# Patient Record
Sex: Male | Born: 1940 | Race: White | Hispanic: No | Marital: Married | Smoking: Never smoker
Health system: Southern US, Community
[De-identification: ages and names within clinical notes are randomized; demographics above are authoritative.]

## PROBLEM LIST (undated history)

## (undated) DIAGNOSIS — N400 Enlarged prostate without lower urinary tract symptoms: Secondary | ICD-10-CM

## (undated) DIAGNOSIS — E119 Type 2 diabetes mellitus without complications: Secondary | ICD-10-CM

## (undated) DIAGNOSIS — Z87442 Personal history of urinary calculi: Secondary | ICD-10-CM

## (undated) DIAGNOSIS — I469 Cardiac arrest, cause unspecified: Secondary | ICD-10-CM

## (undated) DIAGNOSIS — M1990 Unspecified osteoarthritis, unspecified site: Secondary | ICD-10-CM

## (undated) DIAGNOSIS — D649 Anemia, unspecified: Secondary | ICD-10-CM

## (undated) DIAGNOSIS — R972 Elevated prostate specific antigen [PSA]: Secondary | ICD-10-CM

## (undated) DIAGNOSIS — I2510 Atherosclerotic heart disease of native coronary artery without angina pectoris: Secondary | ICD-10-CM

## (undated) DIAGNOSIS — I219 Acute myocardial infarction, unspecified: Secondary | ICD-10-CM

## (undated) DIAGNOSIS — I459 Conduction disorder, unspecified: Secondary | ICD-10-CM

## (undated) DIAGNOSIS — J45909 Unspecified asthma, uncomplicated: Secondary | ICD-10-CM

## (undated) DIAGNOSIS — G8929 Other chronic pain: Secondary | ICD-10-CM

## (undated) DIAGNOSIS — R0600 Dyspnea, unspecified: Secondary | ICD-10-CM

## (undated) DIAGNOSIS — I10 Essential (primary) hypertension: Secondary | ICD-10-CM

## (undated) DIAGNOSIS — K219 Gastro-esophageal reflux disease without esophagitis: Secondary | ICD-10-CM

## (undated) DIAGNOSIS — M545 Low back pain, unspecified: Secondary | ICD-10-CM

## (undated) DIAGNOSIS — I700 Atherosclerosis of aorta: Secondary | ICD-10-CM

## (undated) DIAGNOSIS — E785 Hyperlipidemia, unspecified: Secondary | ICD-10-CM

## (undated) DIAGNOSIS — Z95810 Presence of automatic (implantable) cardiac defibrillator: Secondary | ICD-10-CM

## (undated) DIAGNOSIS — G459 Transient cerebral ischemic attack, unspecified: Secondary | ICD-10-CM

## (undated) DIAGNOSIS — N430 Encysted hydrocele: Secondary | ICD-10-CM

## (undated) DIAGNOSIS — I255 Ischemic cardiomyopathy: Secondary | ICD-10-CM

## (undated) DIAGNOSIS — G4700 Insomnia, unspecified: Secondary | ICD-10-CM

## (undated) DIAGNOSIS — R55 Syncope and collapse: Secondary | ICD-10-CM

## (undated) DIAGNOSIS — N529 Male erectile dysfunction, unspecified: Secondary | ICD-10-CM

## (undated) DIAGNOSIS — R001 Bradycardia, unspecified: Secondary | ICD-10-CM

## (undated) DIAGNOSIS — M5430 Sciatica, unspecified side: Secondary | ICD-10-CM

## (undated) DIAGNOSIS — I5030 Unspecified diastolic (congestive) heart failure: Secondary | ICD-10-CM

## (undated) DIAGNOSIS — I4891 Unspecified atrial fibrillation: Secondary | ICD-10-CM

## (undated) DIAGNOSIS — Z7901 Long term (current) use of anticoagulants: Secondary | ICD-10-CM

## (undated) HISTORY — DX: Benign prostatic hyperplasia without lower urinary tract symptoms: N40.0

## (undated) HISTORY — DX: Sciatica, unspecified side: M54.30

## (undated) HISTORY — DX: Dyspnea, unspecified: R06.00

## (undated) HISTORY — DX: Conduction disorder, unspecified: I45.9

## (undated) HISTORY — DX: Unspecified asthma, uncomplicated: J45.909

## (undated) HISTORY — DX: Bradycardia, unspecified: R00.1

## (undated) HISTORY — DX: Essential (primary) hypertension: I10

## (undated) HISTORY — DX: Cardiac arrest, cause unspecified: I46.9

## (undated) HISTORY — PX: PROSTATE BIOPSY: SHX241

## (undated) HISTORY — DX: Atherosclerotic heart disease of native coronary artery without angina pectoris: I25.10

## (undated) HISTORY — PX: CORONARY ANGIOPLASTY: SHX604

## (undated) HISTORY — PX: CATARACT EXTRACTION, BILATERAL: SHX1313

## (undated) HISTORY — PX: OTHER SURGICAL HISTORY: SHX169

## (undated) HISTORY — DX: Personal history of urinary calculi: Z87.442

## (undated) HISTORY — DX: Gastro-esophageal reflux disease without esophagitis: K21.9

## (undated) HISTORY — DX: Transient cerebral ischemic attack, unspecified: G45.9

## (undated) HISTORY — DX: Male erectile dysfunction, unspecified: N52.9

## (undated) HISTORY — DX: Syncope and collapse: R55

## (undated) HISTORY — DX: Presence of automatic (implantable) cardiac defibrillator: Z95.810

## (undated) HISTORY — DX: Type 2 diabetes mellitus without complications: E11.9

## (undated) HISTORY — PX: EYE SURGERY: SHX253

## (undated) HISTORY — PX: JOINT REPLACEMENT: SHX530

## (undated) HISTORY — DX: Hyperlipidemia, unspecified: E78.5

---
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
## 1994-01-14 DIAGNOSIS — I2510 Atherosclerotic heart disease of native coronary artery without angina pectoris: Secondary | ICD-10-CM

## 1994-01-14 HISTORY — DX: Atherosclerotic heart disease of native coronary artery without angina pectoris: I25.10

## 2004-11-17 ENCOUNTER — Other Ambulatory Visit: Payer: Self-pay

## 2004-11-19 ENCOUNTER — Ambulatory Visit: Payer: Self-pay | Admitting: Internal Medicine

## 2004-11-27 ENCOUNTER — Inpatient Hospital Stay: Payer: Self-pay | Admitting: Unknown Physician Specialty

## 2004-12-21 HISTORY — PX: OTHER SURGICAL HISTORY: SHX169

## 2005-12-21 DIAGNOSIS — Z95810 Presence of automatic (implantable) cardiac defibrillator: Secondary | ICD-10-CM

## 2005-12-21 HISTORY — PX: OTHER SURGICAL HISTORY: SHX169

## 2005-12-21 HISTORY — DX: Presence of automatic (implantable) cardiac defibrillator: Z95.810

## 2006-06-04 ENCOUNTER — Ambulatory Visit: Payer: Self-pay | Admitting: Internal Medicine

## 2006-06-04 ENCOUNTER — Inpatient Hospital Stay (HOSPITAL_COMMUNITY): Admission: AD | Admit: 2006-06-04 | Discharge: 2006-06-05 | Payer: Self-pay | Admitting: Internal Medicine

## 2006-06-16 ENCOUNTER — Ambulatory Visit: Payer: Self-pay

## 2006-09-28 ENCOUNTER — Ambulatory Visit: Payer: Self-pay | Admitting: Internal Medicine

## 2007-01-13 ENCOUNTER — Ambulatory Visit: Payer: Self-pay | Admitting: Internal Medicine

## 2007-04-11 ENCOUNTER — Ambulatory Visit: Payer: Self-pay | Admitting: Internal Medicine

## 2007-08-09 ENCOUNTER — Ambulatory Visit: Payer: Self-pay | Admitting: Internal Medicine

## 2007-08-11 ENCOUNTER — Ambulatory Visit: Payer: Self-pay | Admitting: Unknown Physician Specialty

## 2007-09-29 ENCOUNTER — Ambulatory Visit: Payer: Self-pay | Admitting: Internal Medicine

## 2007-11-08 ENCOUNTER — Ambulatory Visit: Payer: Self-pay | Admitting: Internal Medicine

## 2008-02-07 ENCOUNTER — Ambulatory Visit: Payer: Self-pay | Admitting: Internal Medicine

## 2008-05-29 ENCOUNTER — Ambulatory Visit: Payer: Self-pay | Admitting: Internal Medicine

## 2008-09-04 ENCOUNTER — Ambulatory Visit: Payer: Self-pay | Admitting: Internal Medicine

## 2008-12-04 ENCOUNTER — Ambulatory Visit: Payer: Self-pay | Admitting: Internal Medicine

## 2009-02-01 ENCOUNTER — Encounter: Payer: Self-pay | Admitting: Internal Medicine

## 2009-03-05 ENCOUNTER — Ambulatory Visit: Payer: Self-pay | Admitting: Internal Medicine

## 2009-06-04 ENCOUNTER — Ambulatory Visit: Payer: Self-pay | Admitting: Internal Medicine

## 2009-06-05 ENCOUNTER — Encounter: Payer: Self-pay | Admitting: Internal Medicine

## 2009-06-13 ENCOUNTER — Encounter: Payer: Self-pay | Admitting: Internal Medicine

## 2009-09-04 ENCOUNTER — Encounter (INDEPENDENT_AMBULATORY_CARE_PROVIDER_SITE_OTHER): Payer: Self-pay | Admitting: *Deleted

## 2009-10-15 ENCOUNTER — Ambulatory Visit: Payer: Self-pay | Admitting: Internal Medicine

## 2009-10-15 DIAGNOSIS — I442 Atrioventricular block, complete: Secondary | ICD-10-CM

## 2009-10-15 DIAGNOSIS — I10 Essential (primary) hypertension: Secondary | ICD-10-CM

## 2009-10-15 DIAGNOSIS — R55 Syncope and collapse: Secondary | ICD-10-CM

## 2009-10-15 DIAGNOSIS — I255 Ischemic cardiomyopathy: Secondary | ICD-10-CM | POA: Insufficient documentation

## 2009-10-15 DIAGNOSIS — I259 Chronic ischemic heart disease, unspecified: Secondary | ICD-10-CM | POA: Insufficient documentation

## 2010-01-21 ENCOUNTER — Encounter: Payer: Self-pay | Admitting: Internal Medicine

## 2010-01-31 ENCOUNTER — Ambulatory Visit: Payer: Self-pay | Admitting: Internal Medicine

## 2010-02-14 ENCOUNTER — Encounter: Payer: Self-pay | Admitting: Internal Medicine

## 2010-05-06 ENCOUNTER — Ambulatory Visit: Payer: Self-pay | Admitting: Internal Medicine

## 2010-05-07 ENCOUNTER — Encounter: Payer: Self-pay | Admitting: Internal Medicine

## 2010-05-22 ENCOUNTER — Encounter: Payer: Self-pay | Admitting: Internal Medicine

## 2010-08-08 ENCOUNTER — Encounter: Payer: Self-pay | Admitting: Internal Medicine

## 2010-08-17 ENCOUNTER — Encounter: Payer: Self-pay | Admitting: Internal Medicine

## 2010-08-19 ENCOUNTER — Ambulatory Visit: Payer: Self-pay | Admitting: Internal Medicine

## 2010-10-14 ENCOUNTER — Ambulatory Visit: Payer: Self-pay | Admitting: Internal Medicine

## 2010-10-14 DIAGNOSIS — I5022 Chronic systolic (congestive) heart failure: Secondary | ICD-10-CM

## 2010-10-21 DIAGNOSIS — G459 Transient cerebral ischemic attack, unspecified: Secondary | ICD-10-CM

## 2010-10-21 HISTORY — DX: Transient cerebral ischemic attack, unspecified: G45.9

## 2010-10-30 ENCOUNTER — Encounter: Payer: Self-pay | Admitting: Internal Medicine

## 2010-10-30 ENCOUNTER — Ambulatory Visit: Payer: Self-pay

## 2010-11-10 ENCOUNTER — Encounter: Payer: Self-pay | Admitting: Internal Medicine

## 2010-11-10 ENCOUNTER — Ambulatory Visit (HOSPITAL_COMMUNITY): Admission: RE | Admit: 2010-11-10 | Discharge: 2010-11-10 | Payer: Self-pay | Admitting: Internal Medicine

## 2010-11-10 ENCOUNTER — Ambulatory Visit: Payer: Self-pay | Admitting: Internal Medicine

## 2010-11-17 ENCOUNTER — Telehealth: Payer: Self-pay | Admitting: Internal Medicine

## 2010-11-17 ENCOUNTER — Ambulatory Visit: Payer: Self-pay | Admitting: Internal Medicine

## 2010-11-18 ENCOUNTER — Ambulatory Visit: Payer: Self-pay | Admitting: Internal Medicine

## 2010-11-18 ENCOUNTER — Ambulatory Visit: Payer: Medicare Other | Admitting: Internal Medicine

## 2010-12-16 ENCOUNTER — Ambulatory Visit: Payer: Self-pay | Admitting: Internal Medicine

## 2010-12-17 ENCOUNTER — Encounter (INDEPENDENT_AMBULATORY_CARE_PROVIDER_SITE_OTHER): Payer: Self-pay | Admitting: *Deleted

## 2010-12-17 ENCOUNTER — Ambulatory Visit
Admission: RE | Admit: 2010-12-17 | Discharge: 2010-12-17 | Payer: Self-pay | Source: Home / Self Care | Attending: Internal Medicine | Admitting: Internal Medicine

## 2010-12-17 ENCOUNTER — Encounter: Payer: Self-pay | Admitting: Internal Medicine

## 2010-12-18 ENCOUNTER — Telehealth: Payer: Self-pay | Admitting: Internal Medicine

## 2010-12-19 ENCOUNTER — Ambulatory Visit: Payer: Self-pay | Admitting: Cardiology

## 2010-12-19 ENCOUNTER — Ambulatory Visit
Admission: RE | Admit: 2010-12-19 | Discharge: 2010-12-19 | Payer: Self-pay | Source: Home / Self Care | Attending: Internal Medicine | Admitting: Internal Medicine

## 2010-12-24 ENCOUNTER — Encounter: Payer: Self-pay | Admitting: Internal Medicine

## 2010-12-29 ENCOUNTER — Encounter: Payer: Self-pay | Admitting: Cardiovascular Disease

## 2010-12-29 ENCOUNTER — Ambulatory Visit
Admission: RE | Admit: 2010-12-29 | Discharge: 2010-12-29 | Payer: Self-pay | Source: Home / Self Care | Attending: Internal Medicine | Admitting: Internal Medicine

## 2010-12-30 LAB — CONVERTED CEMR LAB
BUN: 31 mg/dL — ABNORMAL HIGH (ref 6–23)
Basophils Absolute: 0 10*3/uL (ref 0.0–0.1)
Basophils Relative: 0 % (ref 0–1)
CO2: 26 meq/L (ref 19–32)
Calcium: 9.9 mg/dL (ref 8.4–10.5)
Chloride: 102 meq/L (ref 96–112)
Creatinine, Ser: 0.86 mg/dL (ref 0.40–1.50)
Eosinophils Absolute: 0.2 10*3/uL (ref 0.0–0.7)
Eosinophils Relative: 2 % (ref 0–5)
Glucose, Bld: 96 mg/dL (ref 70–99)
HCT: 43.4 % (ref 39.0–52.0)
Hemoglobin: 14.3 g/dL (ref 13.0–17.0)
INR: 0.99 (ref <1.50)
Lymphocytes Relative: 33 % (ref 12–46)
Lymphs Abs: 2.1 10*3/uL (ref 0.7–4.0)
MCHC: 32.9 g/dL (ref 30.0–36.0)
MCV: 87.7 fL (ref 78.0–100.0)
Monocytes Absolute: 0.5 10*3/uL (ref 0.1–1.0)
Monocytes Relative: 8 % (ref 3–12)
Neutro Abs: 3.5 10*3/uL (ref 1.7–7.7)
Neutrophils Relative %: 56 % (ref 43–77)
Platelets: 166 10*3/uL (ref 150–400)
Potassium: 4.4 meq/L (ref 3.5–5.3)
Prothrombin Time: 13.3 s (ref 11.6–15.2)
RBC: 4.95 M/uL (ref 4.22–5.81)
RDW: 13 % (ref 11.5–15.5)
Sodium: 139 meq/L (ref 135–145)
WBC: 6.3 10*3/uL (ref 4.0–10.5)
aPTT: 26 s (ref 24–37)

## 2011-01-02 ENCOUNTER — Ambulatory Visit
Admission: RE | Admit: 2011-01-02 | Discharge: 2011-01-02 | Payer: Self-pay | Source: Home / Self Care | Attending: Internal Medicine | Admitting: Internal Medicine

## 2011-01-05 LAB — POCT I-STAT 3, ART BLOOD GAS (G3+)
Acid-Base Excess: 3 mmol/L — ABNORMAL HIGH (ref 0.0–2.0)
Bicarbonate: 28 mEq/L — ABNORMAL HIGH (ref 20.0–24.0)
O2 Saturation: 95 %
TCO2: 29 mmol/L (ref 0–100)
pCO2 arterial: 42.7 mmHg (ref 35.0–45.0)
pH, Arterial: 7.425 (ref 7.350–7.450)
pO2, Arterial: 72 mmHg — ABNORMAL LOW (ref 80.0–100.0)

## 2011-01-05 LAB — POCT I-STAT 3, VENOUS BLOOD GAS (G3P V)
Acid-Base Excess: 1 mmol/L (ref 0.0–2.0)
Acid-Base Excess: 1 mmol/L (ref 0.0–2.0)
Bicarbonate: 26.7 mEq/L — ABNORMAL HIGH (ref 20.0–24.0)
Bicarbonate: 26.8 mEq/L — ABNORMAL HIGH (ref 20.0–24.0)
O2 Saturation: 64 %
O2 Saturation: 66 %
TCO2: 28 mmol/L (ref 0–100)
TCO2: 28 mmol/L (ref 0–100)
pCO2, Ven: 45.8 mmHg (ref 45.0–50.0)
pCO2, Ven: 47.1 mmHg (ref 45.0–50.0)
pH, Ven: 7.363 — ABNORMAL HIGH (ref 7.250–7.300)
pH, Ven: 7.374 — ABNORMAL HIGH (ref 7.250–7.300)
pO2, Ven: 34 mmHg (ref 30.0–45.0)
pO2, Ven: 36 mmHg (ref 30.0–45.0)

## 2011-01-11 NOTE — Procedures (Addendum)
[name], [name]            ACCOUNT NO.:  [id number]  MEDICAL RECORD NO.:  [id number]          PATIENT TYPE:  OIB  LOCATION:  1999                         FACILITY:  [facility]  PHYSICIAN:  Bevelyn Buckles. Tenecia Ignasiak, MDDATE OF BIRTH:  04-28-1941  DATE OF PROCEDURE:  01/02/2011 DATE OF DISCHARGE:  01/02/2011                           CARDIAC CATHETERIZATION   PATIENT IDENTIFICATION:  [name] is a very pleasant 70 year old male with a history of coronary artery disease, status post previous LAD infarct with stenting of the LAD.  He has resultant ischemic cardiomyopathy.  He has always been quite active.  However, recently he has noticed a significant exercise intolerance particularly on the elliptical machine.  He was seen by Dr. Graciela Husbands.  He underwent CPX which showed a relatively normal test; however, given his progressive symptoms, he was referred for right heart catheterization.  PROCEDURE PERFORMED: 1. Right heart cath. 2. Selective coronary angiography. 3. Left heart cath. 4. Left ventriculogram.  DESCRIPTION OF PROCEDURE:  The risks and indications were explained. Consent was signed and placed in the chart.  A 4-French arterial sheath was placed in the right femoral artery using a modified Seldinger technique and a 7-French venous sheath was placed in the right femoral vein using a Seldinger technique.  Standard catheters including JL-4, 3DRCA, and angled pigtail was used for left heart catheterization and Swan-Ganz catheter was used on the right.  There were no apparent complications.  Right atrial pressure mean of 1.  RV pressure 23/1 with EDP of 4.  PA pressure 22/3 with the mean of 11.  Fick cardiac output 4.2 with a index 2.1.  Pulmonary capillary wedge pressure with mean of 3-4.  Central aortic pressure was 92/60 with a mean of 75.  LV pressure 108/6 with a mean of 10.  Left main had mild calcification, but no obstructive clot. LAD was a long vessel coursing  through the apex.  Had a very long area of stenting for the proximal portion through the midportion.  The stents were patent.  There was approximately 20-30% in-stent restenosis throughout, with infection throughout, but no high grade lesion.  There was a first diagonal that was patent.  Left circumflex was a large dominant vessel, gave off a large OM1, small OM2, posterolateral, PDA.  There is a 45% trivial stenosis through the proximal midportion of the left circumflex, 30% lesion in the mid AV groove circumflex.  Right coronary artery was a small nondominant vessel with no high grade stenosis.  Left ventriculogram done in the RAO position showed EF of approximately 40-45% with distal anterior wall and apical dyskinesis.  There was evidence of dyssynchrony.  ASSESSMENT: 1. Coronary artery disease with patent LAD stent and otherwise,     nonobstructive coronary disease. 2. Normal right-sided pressures with normal cardiac output. 3. Left ventricular dysfunction with ejection fraction of 40-45%.  PLAN/DISCUSSION:  Based on his exercise testing, cardiac catheterization, I do not see a clear cardiac limitation for his symptom.  Perhaps, we could pursue more of a pulmonary route.  We can also try and supervise exercise training program to see if this makes a difference.  Bevelyn Buckles. Ronnie Wigley, MD     DRB/MEDQ  D:  01/02/2011  T:  01/03/2011  Job:  [id number]  Electronically Signed by Ronnie Meres MD on 01/11/2011 07:13:05 PM

## 2011-01-16 ENCOUNTER — Encounter: Payer: Self-pay | Admitting: Physician Assistant

## 2011-01-16 ENCOUNTER — Encounter: Payer: Self-pay | Admitting: Cardiology

## 2011-01-16 ENCOUNTER — Ambulatory Visit
Admission: RE | Admit: 2011-01-16 | Discharge: 2011-01-16 | Payer: Self-pay | Source: Home / Self Care | Attending: Physician Assistant | Admitting: Physician Assistant

## 2011-01-16 DIAGNOSIS — R0602 Shortness of breath: Secondary | ICD-10-CM | POA: Insufficient documentation

## 2011-01-20 NOTE — Assessment & Plan Note (Signed)
Summary: mdt icd   Allergies: 1)  ! Sulfa 2)  ! * Oxycodone    ICD Specifications Following MD:  Willa Rough, MD     ICD Vendor:  Medtronic     ICD Model Number:  [phone]     ICD Serial Number:  [id number] ICD DOI:  06/04/2006     ICD Implanting MD:  Sherryl Manges, MD  Lead 1:    Location: RA     DOI: 06/04/2006     Model #: 1191     Serial #: [id number]     Status: active Lead 2:    Location: RV     DOI: 06/04/2006     Model #: 0865     Serial #: [id number]     Status: active  Indications::  ICM   ICD Follow Up Battery Voltage:  2.71 V     Charge Time:  9.20 seconds     Underlying rhythm:  SR ICD Dependent:  Yes       ICD Device Measurements Atrium:  Amplitude: 3.3 mV, Impedance: 368 ohms, Threshold: 1.0 V at 0.3 msec Right Ventricle:  Amplitude: 9.9 mV, Impedance: 400 ohms, Threshold: 1.0 V at 0.3 msec Shock Impedance: 42/53 ohms   Episodes MS Episodes:  0     Coumadin:  No Shock:  0     ATP:  0     Nonsustained:  0     Atrial Therapies:  0 Atrial Pacing:  99.5%     Ventricular Pacing:  99.6%  Brady Parameters Mode DDDR     Lower Rate Limit:  60     Upper Rate Limit 140 PAV 250     Sensed AV Delay:  250  Tachy Zones VF:  200     VT:  250     VT1:  176     Tech Comments:  PT HAD SYNCOPAL EPISODE ON 11-27 WHILE DRIVING.  PT SCHEDULED FOR DEVICE CHECK IN ? OF [phone] LEAD.  SIC 0 AND NORMAL LEAD IMPEDANCES.  NORMAL DEVICE FUNCTION.  NO EPISODES SINCE LAST CHECK.  PER SK---48 HOLTER MONITOR AND DRIVING IS RESTRICTED AT THIS TIME UNTIL CAUSE IS FIGURED OUT.  NO CHANGES MADE. ROV 12-16-10 @ 1115 W/SK. Vella Kohler  November 17, 2010 4:07 PM  Other Orders: Holter Monitor (Holter Monitor)

## 2011-01-20 NOTE — Cardiovascular Report (Signed)
Summary: Office Visit Remote   Office Visit Remote   Imported By: Roderic Ovens 02/14/2010 13:56:31  _____________________________________________________________________  External Attachment:    Type:   Image     Comment:   External Document

## 2011-01-20 NOTE — Letter (Signed)
Summary: Device-Delinquent Phone Journalist, newspaper, Main Office  [address]   [place], [place] [address]   Phone: [phone]  Fax: [phone]     January 21, 2010 MRN: [id number]   [facility] [address] [place], [place]  [address]   Dear Mr. [facility],  According to our records, you were scheduled for a device phone transmission on  January 14, 2010.     We did not receive any results from this check.  If you transmitted on your scheduled day, please call us to help troubleshoot your system.  If you forgot to send your transmission, please send one upon receipt of this letter.  Thank you,   Architectural technologist [facility]

## 2011-01-20 NOTE — Assessment & Plan Note (Signed)
Summary: pc2 medtronic   Visit Type:  ICD-Metronic, check  CC:  fatigued.  History of Present Illness: [name] is  seen in followup for syncope occurring in the setting of ischemic heart disease depressed left ventricular function and first and second degree heart block. He is s/p meditronic DD-ICD implantation  Is no complaint of chest pain. There has been no peripheral edema.  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 Take One Tablet By Mouth Daily 2)  Avodart 0.5 Mg Caps (Dutasteride) .... Take 1 By Mouth Three Times A Week 3)  Lipitor 10 Mg Tabs (Atorvastatin Calcium) .... Take One Tablet By Mouth Daily. 4)  Zetia 10 Mg Tabs (Ezetimibe) .... Take One Tablet By Mouth Daily. 5)  Plavix 75 Mg Tabs (Clopidogrel Bisulfate) .... Take One Tablet By Mouth Daily 6)  Oscal 500/200 D-3 500-200 Mg-Unit Tabs (Calcium-Vitamin D) .... Once Daily 7)  Multivitamins  Tabs (Multiple Vitamin) .... Take 1 By Mouth Once Daily 8)  Naproxen Sodium 220 Mg Tabs (Naproxen Sodium) .... Take 2 By Mouth Two Times A Day  Allergies (verified): 1)  ! Sulfa 2)  ! * Oxycodone  Past History:  Past Medical History: Last updated: 10/13/2010 Medtronic [address] 0630 Coronary artery disease-stent to a 95%-occluded LAD in 1995. History of recurrent syncope History of cardiac arrest Ischemic cardiomyopathy, ejection fraction 35% to 45%-2007 Severe bradycardia  Past Surgical History: Last  updated: 10/13/2010 Implantation of Medtronic dual-chamber cardioverter  Vital Signs:  Patient profile:   70 year old male Height:      67 inches Weight:      186.25 pounds BMI:     29.28 Pulse rate:   67 / minute BP sitting:   150 / 105  (right arm) Cuff size:   regular  Vitals Entered By: Caralee Ates CMA (October 14, 2010 12:00 PM)  Physical Exam  General:  The patient was alert and oriented in no acute distress. HEENT Normal.  Neck veins were flat, carotids were brisk.  Lungs were clear.  Heart sounds were regular without murmurs or gallops.  Abdomen was soft with active bowel sounds. There is no clubbing cyanosis or edema. Skin Warm and dry    EKG  Procedure date:  10/14/2010  Findings:      AV pacing   ICD Specifications Following MD:  Willa Rough, MD     ICD Vendor:  Medtronic     ICD Model Number:  7278     ICD Serial Number:  [id number] ICD DOI:  06/04/2006     ICD Implanting MD:  Sherryl Manges, MD  Lead 1:    Location: RA     DOI: 06/04/2006     Model #: 5573     Serial #: [id number]     Status: active Lead 2:    Location: RV     DOI: 06/04/2006     Model #: 2376     Serial #: [id number]  Status: active  Indications::  ICM   ICD Follow Up Battery Voltage:  2.75 V     Charge Time:  9.20 seconds     Underlying rhythm:  SR ICD Dependent:  Yes       ICD Device Measurements Atrium:  Amplitude: PACED mV, Impedance: 376 ohms, Threshold: 1.0 V at 0.3 msec Right Ventricle:  Amplitude: 10.9 mV, Impedance: 384 ohms, Threshold: 1.0 V at 0.3 msec Shock Impedance: 42/53 ohms   Episodes MS Episodes:  1     Percent Mode Switch:  <0.1%     Coumadin:  No Shock:  0     ATP:  0     Nonsustained:  0     Atrial Therapies:  0 Atrial Pacing:  97.4%     Ventricular Pacing:  97%  Brady Parameters Mode DDDR     Lower Rate Limit:  60     Upper Rate Limit 140 PAV 250     Sensed AV Delay:  250  Tachy Zones VF:  200     VT:  250     VT1:  176     Next Remote Date:   01/15/2011     Next Cardiology Appt Due:  09/21/2011 Tech Comments:  1 MODE SWITCH LASTING 6 SECONDS.  5366 LEAD STABLE. SIC 0.  NORMAL DEVICE FUNCTION.  NO CHANGES MADE. DEMONSTRATED TONES FOR PT AND PT AWARE OF ALARM TIMES.  PT HAS RECEIVED LETTER/MAGNET IN THE PAST.  CARELINK 01-15-11 AND ROV IN 12 MTHS W/SK. Vella Kohler  October 14, 2010 12:14 PM  Impression & Recommendations:  Problem # 1:  [phone] LEAD (ICD-996.04) His lead is  stable and alerts were  reviewed  Problem # 2:  SECOND DEGREE AV BLOCK, MOBITZ II (ICD-426.12) He has had progressive 80 conduction challenges with prolongation of his AV interval. He was V. pacing at an AV interval of 350 ms today. He had been programmed at 250 ms and I have reduced that to 180 ms His updated medication list for this problem includes:    Lisinopril 20 Mg Tabs (Lisinopril) .[name]... Take one tablet by mouth daily    Plavix 75 Mg Tabs (Clopidogrel bisulfate) .[name]... Take one tablet by mouth daily  Problem # 3:  SYSTOLIC HEART FAILURE, CHRONIC (ICD-428.22)  Is not clear to me but I suspect that there is a congestive component to his shortness of breath as opposed to just a residual pulmonary component. Cardiopulmonary stress testing will help [place] to elucidate this. There may be a need for pulmonary evaluation also.  As he is 100% V. paced at this point, it could also be an worsening of LV function related to dyssynchrony.we will look at an echo to see what is going on with LV function.  At initial implantation LV lead placement was attempted but unsuccessful. His congestive heart failure his persistent and attributable as the cause of his dyspnea, consideration would be given to surgical placement of an LV lead His updated medication list for this problem includes:    Lisinopril 20 Mg Tabs (Lisinopril) .[name]... Take one tablet by mouth daily    Plavix 75 Mg Tabs (Clopidogrel bisulfate) .[name]... Take one tablet by mouth daily  Orders: Echocardiogram  (Echo)  Problem # 4:  CARDIOMYOPATHY, ISCHEMIC (ICD-414.8) the possibility of silent ischemia is contributing to his exercise intolerance also needs to be considered and Myoview scanning will likely be appropriate. His updated medication list for this problem includes:    Lisinopril 20 Mg  Tabs (Lisinopril) .[name]... Take one tablet by mouth daily    Plavix 75 Mg Tabs (Clopidogrel bisulfate) .[name]... Take one tablet by mouth daily  Orders: CPX Test at [facility] (CPX Test) Echocardiogram (Echo)  Patient Instructions: 1)  Your physician recommends that you continue on your current medications as directed. Please refer to the Current Medication list given to you today. 2)  Your physician has requested that you have an echocardiogram in the [facility] .  Echocardiography is a painless test that uses sound waves to create images of your heart. It provides your doctor with information about the size and shape of your heart and how well your heart's chambers and valves are working.  This procedure takes approximately one hour. There are no restrictions for this procedure. 3)  Your physician has recommended that you have a cardiopulmonary stress test (CPX).  CPX testing is a non-invasive measurement of heart and lung function. It replaces a traditional treadmill stress test. This type of test provides a tremendous amount of information that relates not only to your present condition but also for future outcomes.  This test combines measurements of your ventilation, respiratory gas exchange in the lungs, electrocardiogram (EKG), blood pressure and physical response before, during, and following an exercise protocol. 4)  Your physician recommends that you schedule a follow-up appointment in: 2 months

## 2011-01-20 NOTE — Assessment & Plan Note (Signed)
Summary: 48 HOUR HOLTER/AMD  Nurse Visit  Comments Patient came into office today placed for a 48 hour holter monitor.Bishop Dublin, CMA  November 21, 2010 3:26 PM      Allergies: 1)  ! Sulfa 2)  ! * Oxycodone  Appended Document: 48 HOUR HOLTER/AMD Dr. Graciela Husbands read Holter montior results.  No evidence of inhibition of pacing. Attempted to contact pt LMOM TCB MES  Appended Document: 48 HOUR HOLTER/AMD Called spoke with pt aware of results.  Pt has pending appt with Dr Graciela Husbands on 12/16/10.  EWJ

## 2011-01-20 NOTE — Cardiovascular Report (Signed)
Summary: Office Visit Remote   Office Visit Remote   Imported By: Roderic Ovens 05/23/2010 10:58:20  _____________________________________________________________________  External Attachment:    Type:   Image     Comment:   External Document

## 2011-01-20 NOTE — Letter (Signed)
Summary: Device-Delinquent Phone Journalist, newspaper, Main Office  [address]   [place], [place] [address]   Phone: [phone]  Fax: [phone]     August 08, 2010 MRN: [id number]   [facility] [address] [place], [place]  [address]   Dear Mr. REINE,  According to our records, you were scheduled for a device phone transmission on   08-07-2010.     We did not receive any results from this check.  If you transmitted on your scheduled day, please call us to help troubleshoot your system.  If you forgot to send your transmission, please send one upon receipt of this letter.  Thank you,   Architectural technologist [facility]

## 2011-01-20 NOTE — Cardiovascular Report (Signed)
Summary: Office Visit Remote   Office Visit Remote   Imported By: Roderic Ovens 09/15/2010 16:09:45  _____________________________________________________________________  External Attachment:    Type:   Image     Comment:   External Document

## 2011-01-20 NOTE — Letter (Signed)
Summary: Remote Device Check  [employer], Main Office  [address]   [place], [place] [address]   Phone: [phone]  Fax: [phone]     February 14, 2010 MRN: [id number]   [facility] [address] [place], [place]  [address]   Dear Mr. [facility],   Your remote transmission was recieved and reviewed by your physician.  All diagnostics were within normal limits for you.  ___X__Your next transmission is scheduled for:     May 06, 2010.  Please transmit at any time this day.  If you have a wireless device your transmission will be sent automatically.      Sincerely,  Proofreader
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
## 2011-01-20 NOTE — Letter (Signed)
Summary: Remote Device Check  [employer], Main Office  [address]   [place], [place] [address]   Phone: [phone]  Fax: [phone]     May 22, 2010 MRN: [id number]   [facility] [address] [place], [place]  [address]   Dear Mr. [facility],   Your remote transmission was recieved and reviewed by your physician.  All diagnostics were within normal limits for you.  __X___Your next transmission is scheduled for:   08-07-2010.  Please transmit at any time this day.  If you have a wireless device your transmission will be sent automatically.   Sincerely,  Vella Kohler

## 2011-01-22 NOTE — Letter (Signed)
Summary: Cardiac Catheterization Instructions- [facility]  [employer], Main Office  [address]   [place], [place] [address]   Phone: [phone]  Fax: [phone]     12/17/2010 MRN: [id number]  [facility] [address] [place], [place]  [address]  Dear Mr. LEH,   You are scheduled for a Cardiac Catheterization on January 02, 2011 at 10:00 am with Dr. Gala Romney.  Please arrive to the 1st floor of the Heart and Vascular Center at [facility] at 9:00 am / pm on the day of your procedure. Please do not arrive before 6:30 a.m. Call the Heart and Vascular Center at [phone] if you are unable to make your appointmnet. The Code to get into the parking garage under the building is 0030. Take the elevators to the 1st floor. You must have someone to drive you home. Someone must be with you for the first 24 hours after you arrive home. Please wear clothes that are easy to get on and off and wear slip-on shoes. Do not eat or drink after midnight except water with your medications that morning. Bring all your medications and current insurance cards with you.   __x_ You may take ALL of your medications with water that morning.  Have labs drawn at the [facility] office December 29, 2010.    The usual length of stay after your procedure is 2 to 3 hours. This can vary.  If you have any questions, please call the office at the number listed above.   Claris Gladden RN

## 2011-01-22 NOTE — Procedures (Signed)
Summary: Summary Report  Summary Report   Imported By: Erle Crocker 01/02/2011 14:56:50  _____________________________________________________________________  External Attachment:    Type:   Image     Comment:   External Document

## 2011-01-22 NOTE — Miscellaneous (Signed)
  Clinical Lists Changes  Observations: Added new observation of CARDCATHFIND: ASSESSMENT: 1. Coronary artery disease with patent LAD stent and otherwise,     nonobstructive coronary disease. 2. Normal right-sided pressures with normal cardiac output. 3. Left ventricular dysfunction with ejection fraction of 40-45%.   PLAN/DISCUSSION:  Based on his exercise testing, cardiac catheterization, I do not see a clear cardiac limitation for his symptom.  Perhaps, we could pursue more of a pulmonary route.  We can also try and supervise exercise training program to see if this makes a difference.   (01/03/2011 7:59)      Cardiac Cath  Procedure date:  01/03/2011  Findings:      ASSESSMENT: 1. Coronary artery disease with patent LAD stent and otherwise,     nonobstructive coronary disease. 2. Normal right-sided pressures with normal cardiac output. 3. Left ventricular dysfunction with ejection fraction of 40-45%.   PLAN/DISCUSSION:  Based on his exercise testing, cardiac catheterization, I do not see a clear cardiac limitation for his symptom.  Perhaps, we could pursue more of a pulmonary route.  We can also try and supervise exercise training program to see if this makes a difference.

## 2011-01-22 NOTE — Assessment & Plan Note (Addendum)
Summary: eph per kim from cath lab/lg   Visit Type:  EPH Primary Provider:  Dr. Fidela Juneau. [place], [place]  CC:  pt has no complaints today.  History of Present Illness: Primary Electrophysiologist:  Dr. Sherryl Manges  [name] is a 70 yo male with a h/o CAD, s/p anterior MI treated with stent to the LAD in the past, ICM with EF 35-45%, s/p Medtronic dual chamber AICD, syncope and bradycardia prompting pacer implant who has been evaluated recently by Dr. Graciela Husbands for noticeable exercise intolerance.  She had a CPX which was normal.  A CT scan was neg. for a pulmonary embolism.  The scan did show some mild scarring.  An echo in 10/2010 demonstrated an EF 30-35%, anterior, AS and apical HK; Grade 1 diast dysfxn; mild BAE; mild reduced RVF; PASP 27.  PFTs demonstrated mild obstruction with small airway disease, + bronchodilator response, hyperinflatoin and airtrapping and normal diffusion.  He was referred for R+L heart cath.  This was done on 01/02/11 and demonstrated patent stents in the LAD with 20-30% ISR, mCFX 45%, mid AVCFX 30%; RCA ok; ant and apical DK with EF 40-45%.  Right heart cath demonstrated normal pressures and CO (mean RA 1, RV 23/1, PAP 22/3 with mean 11, CO 4.2, CI 2.1, PCWP mean 3-4).  He returns for post cath follow up.  He has lots of questions about his decreased exercise tolerance.  He denies chest pain, syncope, orthopnea, PND.  There is no history of snoring.  He does have daytime hypersomnolence.  He denies wheezing.  He denies cough.  He does have increased belching.  He denies water brash.  He still has increased dyspnea with exertion.  Current Medications (verified): 1)  Lisinopril-Hydrochlorothiazide 20-12.5 Mg Tabs (Lisinopril-Hydrochlorothiazide) .... Once Daily 2)  Avodart 0.5 Mg Caps (Dutasteride) .... Take 1 By Mouth Three Times A Week 3)  Lipitor 20 Mg Tabs (Atorvastatin Calcium) .... Take 1 Tablet By Mouth Once A Day 4)  Zetia 10 Mg Tabs (Ezetimibe) .... Take  One Tablet By Mouth Daily. 5)  Plavix 75 Mg Tabs (Clopidogrel Bisulfate) .... Take One Tablet By Mouth Daily 6)  Oscal 500/200 D-3 500-200 Mg-Unit Tabs (Calcium-Vitamin D) .... Once Daily 7)  Multivitamins  Tabs (Multiple Vitamin) .... Take 1 By Mouth Once Daily 8)  Naproxen Sodium 220 Mg Tabs (Naproxen Sodium) .... Take 2 By Mouth Two Times A Day 9)  Aspirin 81 Mg Tbec (Aspirin) .... Take 2 Tablets  By Mouth Daily  Allergies (verified): 1)  ! Sulfa 2)  ! * Oxycodone  Past History:  Past Medical History: Last updated: 10/13/2010 Medtronic Maximo DR 4540 Coronary artery disease-stent to a 95%-occluded LAD in 1995. History of recurrent syncope History of cardiac arrest Ischemic cardiomyopathy, ejection fraction 35% to 45%-2007 Severe bradycardia  Review of Systems       As per  the HPI.  All other systems reviewed and negative.   Vital Signs:  Patient profile:   70 year old male Height:      67 inches Weight:      185.75 pounds BMI:     29.20 Pulse rate:   65 / minute Pulse rhythm:   irregular BP sitting:   110 / 80  (right arm)  Vitals Entered By: Danielle Rankin, CMA (January 16, 2011 9:08 AM)  Physical Exam  General:  Well nourished, well developed, in no acute distress HEENT: normal Neck: no JVD Cardiac:  normal S1, S2; RRR; no murmur Lungs:  clear to auscultation bilaterally, no wheezing, rhonchi or rales Abd: soft, nontender, no hepatomegaly Ext: no edema; RFA site without hematoma or bruit Skin: warm and dry Neuro:  CNs 2-12 intact, no focal abnormalities noted    EKG  Procedure date:  01/16/2011  Findings:      AV paced HR 65    ICD Specifications Following MD:  Willa Rough, MD     ICD Vendor:  Medtronic     ICD Model Number:  7278     ICD Serial Number:  [id number] ICD DOI:  06/04/2006     ICD Implanting MD:  Sherryl Manges, MD  Lead 1:    Location: RA     DOI: 06/04/2006     Model #: [id number]     Serial #: [id number]     Status: active Lead 2:     Location: RV     DOI: 06/04/2006     Model #: [id number]     Serial #: [id number]     Status: active  Indications::  ICM   ICD Follow Up ICD Dependent:  Yes      Episodes Coumadin:  No  Brady Parameters Mode DDDR     Lower Rate Limit:  60     Upper Rate Limit 140 PAV 250     Sensed AV Delay:  250  Tachy Zones VF:  200     VT:  250     VT1:  176     Impression & Recommendations:  Problem # 1:  SHORTNESS OF BREATH (ICD-786.05) I spoke to Dr. Graciela Husbands over the phone.  The patient did have some mild obstructive disease on pulmonary function tests.  He does have some daytime hypersomnolence.  He may benefit from pulmonary rehabilitation.  He may also benefit from testing to rule out sleep apnea.  The patient notes that he has used inhalers in the past.  They were of no help.  He denies any wheezing or significant symptoms of acid reflux.  We will refer him to pulmonology for further evaluation.  I suggested that he try using Pepcid over-the-counter twice a day for a few weeks to see if this helps his symptoms at all.  I do not suspect that it will,  but he has had some increased belching.  If he's had some increased acid, this may be causing some bronchospasm.  Again, his symptoms or not that significant and I am not certain that it will help.  But, it is certainly worth a try.  I will bring him back in followup with Dr. Graciela Husbands in the next several weeks.  Orders: Pulmonary Referral (Pulmonary)  Problem # 2:  HYPERTENSION, BENIGN (ICD-401.1) Controlled.  Problem # 3:  CARDIOMYOPATHY, ISCHEMIC (ICD-414.8) Volume stable.  Coronary anatomy stable on cath.  Orders: EKG w/ Interpretation (93000)  Patient Instructions: 1)  Your physician recommends that you schedule a follow-up appointment in: 4-6 weeks with Dr. Graciela Husbands 2)  Your physician recommends that you continue on your current medications as directed. Please refer to the Current Medication list given to you today. 3)  You have been referred to a  Pulmonary doctor

## 2011-01-22 NOTE — Miscellaneous (Signed)
Summary: Orders Update-pft charges//jwr  Clinical Lists Changes  Orders: Added new Service order of Carbon Monoxide diffusing w/capacity (94720) - Signed Added new Service order of Lung Volumes (94240) - Signed Added new Service order of Spirometry (Pre & Post) (94060) - Signed 

## 2011-01-22 NOTE — Progress Notes (Signed)
Summary: Pt request call  Phone Note Call from Patient Call back at Home Phone [phone] Call back at [phone]   Caller: Patient Reason for Call: Talk to Nurse Initial call taken by: Judie Grieve,  December 18, 2010 12:13 PM  Follow-up for Phone Call        answered pt questions regarding tests in am.  Follow-up by: Claris Gladden RN,  December 18, 2010 12:52 PM

## 2011-01-22 NOTE — Cardiovascular Report (Signed)
Summary: Pre Cath Orders   Pre Cath Orders   Imported By: Roderic Ovens 01/06/2011 14:38:10  _____________________________________________________________________  External Attachment:    Type:   Image     Comment:   External Document

## 2011-01-22 NOTE — Assessment & Plan Note (Signed)
Summary: f47m per check out on 10/25/lg   Visit Type:  F2M PER CHECK OUT ON 10/25/LG  CC:  NO COMPLAINTS.  History of Present Illness: Mr [name] is  seen in followup for syncope occurring in the setting of ischemic heart disease depressed left ventricular function and first and second degree heart block. He is s/p meditronic DD-ICD implantation.  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 further investigation included a Holter monitor which demonstrated no loss of capture.   Is no complaint of chest pain. There has been no peripheral edema. but he has noted significant change in exercise tolerance. Previously he could do 35 minutes on an elliptical and now he can only do 5. Cross-country skiing-like motions however has not been impaired.  He underwent CPX demonstrating an normal heart excursion a VO2 max of 22.4.  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 Take One Tablet By Mouth Daily 2)  Avodart 0.5 Mg Caps (Dutasteride) .... Take 1 By Mouth Three Times A Week 3)  Lipitor 10 Mg Tabs (Atorvastatin Calcium) .... Take One Tablet By Mouth Daily. 4)  Zetia 10 Mg Tabs (Ezetimibe) .... Take One Tablet By Mouth Daily. 5)  Plavix 75 Mg Tabs (Clopidogrel Bisulfate) .... Take One Tablet By Mouth Daily 6)  Oscal 500/200 D-3  500-200 Mg-Unit Tabs (Calcium-Vitamin D) .... Once Daily 7)  Multivitamins  Tabs (Multiple Vitamin) .... Take 1 By Mouth Once Daily 8)  Naproxen Sodium 220 Mg Tabs (Naproxen Sodium) .... Take 2 By Mouth Two Times A Day  Allergies (verified): 1)  ! Sulfa 2)  ! * Oxycodone  Past History:  Past Medical History: Last updated: 10/13/2010 Medtronic Maximo DR 9528 Coronary artery disease-stent to a 95%-occluded LAD in 1995. History of recurrent syncope History of cardiac arrest Ischemic cardiomyopathy, ejection fraction 35% to 45%-2007 Severe bradycardia  Past Surgical History: Last updated: 10/13/2010 Implantation of Medtronic dual-chamber cardioverter  Vital Signs:  Patient profile:   70 year old male Height:      67 inches Weight:      186.75 pounds BMI:     29.35 Pulse rate:   80 / minute BP sitting:   142 / 98  (left arm) Cuff size:   regular  Vitals Entered By: Caralee Ates CMA (December 16, 2010 11:49 AM)  Physical Exam  General:  The patient was alert and oriented in no acute distress. HEENT Normal.  Neck veins were flat, carotids were brisk.  Lungs were clear.  Heart sounds were regular without murmurs or gallops.  Abdomen was soft with active bowel sounds. There is no clubbing cyanosis or edema.  neurological exam demonstrated normal strength in his hip flexors Skin Warm and dry     ICD Specifications Following MD:  Willa Rough, MD     ICD Vendor:  Medtronic     ICD Model Number:  7278     ICD Serial Number:  [id number] ICD DOI:  06/04/2006     ICD Implanting MD:  Sherryl Manges, MD  Lead 1:    Location: RA     DOI: 06/04/2006     Model #: 3664     Serial #: [id number]     Status: active Lead 2:    Location: RV     DOI: 06/04/2006     Model #: 6387     Serial #: [id number]     Status: active  Indications::  ICM   ICD Follow Up ICD Dependent:  Yes      Episodes Coumadin:  No  Brady Parameters Mode DDDR     Lower Rate Limit:  60     Upper Rate Limit  140 PAV 250     Sensed AV Delay:  250  Tachy Zones VF:  200     VT:  250     VT1:  176     Impression & Recommendations:  Problem # 1:  SYSTOLIC HEART FAILURE, CHRONIC (ICD-428.22) There's been a significant change in his exercise performance. This is largely related to his electrical and not to cross country efforts. This raises the possibility of a different work load related to hip flexion or muscle weakness in his hip flexors the latter which I did not see. His CPX is normal. It may well be normal for age but abnormal for him. I have told him that I would have a low threshold for pursuing catheterization. I were reviewed with Dr. Dorthea Cove the issue of work efforts as noted above.  there is also an issue of blood pressure. I will have him increase his lisinopril M. load on his CPX was a [place] protocol. His updated medication list for this problem includes:    Lisinopril-hydrochlorothiazide 20-12.5 Mg Tabs (Lisinopril-hydrochlorothiazide) ..... Once daily    Plavix 75 Mg Tabs (Clopidogrel bisulfate) .[name]... Take one tablet by mouth daily  Problem # 2:  6949 LEAD (ICD-996.04) intact  Problem # 3:  CARDIOMYOPATHY, ISCHEMIC (ICD-414.8) as above His updated medication list for this problem includes:    Lisinopril-hydrochlorothiazide 20-12.5 Mg Tabs (Lisinopril-hydrochlorothiazide) ..... Once daily    Plavix 75 Mg Tabs (Clopidogrel bisulfate) .[name]... 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 He takes a high dose of naprosyn d. We'll plan to change his lisinopril to HCT to see if we can't improve blood pressure control and perhaps offset some of the sodium retention of his Naprosyn  His updated medication list for this problem includes:    Lisinopril-hydrochlorothiazide 20-12.5 Mg Tabs (Lisinopril-hydrochlorothiazide) ..... Once daily  Patient Instructions: 1)  Dr. Graciela Husbands will speak with you after consulting with Dr.  Gala Romney.  2)  Your physician recommends that you return for lab work in 2 weeks at the [place] office. BMET 3)  Your physician has recommended you make the following change in your medication: Stop Lisnopirl, start Lisnopril HCTZ 20/12.5 daily.   Appended Document: [facility]     Allergies: 1)  ! Sulfa 2)  ! * Oxycodone    ICD Specifications Following MD:  Willa Rough, MD     ICD Vendor:  Medtronic     ICD Model Number:  7278     ICD Serial Number:  [id number] ICD DOI:  06/04/2006     ICD Implanting MD:  Sherryl Manges,  MD  Lead 1:    Location: RA     DOI: 06/04/2006     Model #: [id number]     Serial #: [id number]     Status: active Lead 2:    Location: RV     DOI: 06/04/2006     Model #: 2956     Serial #: [id number]     Status: active  Indications::  ICM   ICD Follow Up ICD Dependent:  Yes      Episodes Coumadin:  No  Brady Parameters Mode DDDR     Lower Rate Limit:  60     Upper Rate Limit 140 PAV 250     Sensed AV Delay:  250  Tachy Zones VF:  200     VT:  250     VT1:  176     Other Orders: Pulmonary Function Test (PFT) CT Scan  (CT Scan)

## 2011-02-06 ENCOUNTER — Institutional Professional Consult (permissible substitution) (INDEPENDENT_AMBULATORY_CARE_PROVIDER_SITE_OTHER): Payer: Medicare Other | Admitting: Pulmonary Disease

## 2011-02-06 ENCOUNTER — Encounter: Payer: Self-pay | Admitting: Pulmonary Disease

## 2011-02-06 DIAGNOSIS — R0602 Shortness of breath: Secondary | ICD-10-CM

## 2011-02-11 ENCOUNTER — Ambulatory Visit (INDEPENDENT_AMBULATORY_CARE_PROVIDER_SITE_OTHER): Payer: Medicare Other | Admitting: Internal Medicine

## 2011-02-11 ENCOUNTER — Encounter: Payer: Self-pay | Admitting: Internal Medicine

## 2011-02-11 DIAGNOSIS — R0602 Shortness of breath: Secondary | ICD-10-CM

## 2011-02-11 DIAGNOSIS — Z95810 Presence of automatic (implantable) cardiac defibrillator: Secondary | ICD-10-CM

## 2011-02-11 DIAGNOSIS — I2589 Other forms of chronic ischemic heart disease: Secondary | ICD-10-CM

## 2011-02-11 DIAGNOSIS — I498 Other specified cardiac arrhythmias: Secondary | ICD-10-CM

## 2011-02-17 NOTE — Assessment & Plan Note (Addendum)
Summary: consult for sob//sh   Copy to:  [name] Primary Provider/Referring Provider:  Dr. Fidela Juneau. [place], [place]  CC:  Pulmonary Consult, c/o faigue in legs during exercisre, no stamina , naps 1-2 times during day, and denies wheezing or cough .  History of Present Illness: 70 yo [name] with dyspnea.  He has noticed a problem with his breathing for about one year.  This started last Spring.  At that time he had a respiratory infection and problem with tree allergies.  Since then he has noticed getting more difficulty with exercise.  He has never experienced this before.  His breathing is okay at rest.  He gets fatigued easily.  He has allergies for years, and this has been worse since he moved from [place] to [place].  He typically has a runny nose and sinus congestion.  He is followed by Dr. Fidela Juneau for his allergies, and he was started on allergy shots in May 2011.  He is not sure how much this has helped.  He was also tried on an albuterol inhaler, but this did not fully improve his symptoms either.  He has a cough with occasional production of clear sputum.  He denies hemoptysis or wheeze.  He does feel like it is hard to get air into his lungs.  This is worse when he exercises.  He used to run several miles per day until he had his knee replaced.  Since then he has been using an elliptical trainer.  He now gets winded after exercising for 5 minutes, and gets a burning feel in his legs.  He was never told before that he had asthma.  He denies any prior history of pneumonia or TB.  He denies skin rashes, or gland swelling.  He is a retired Art gallery manager, and used to do office/desk work.  He denies any animal exposure.  He has not had any recent travel or sick exposures.  Pulmonary function testing from Dec. 30, 2011: FEV1 2.93 (107%), FVC 4.05 (102%), FEV1% 72, TLC 8.02 (138%), RV 3.31 (144%), DLCO 110%, positive bronchodilator response.  Echocardiogram  Procedure date:   10/30/2010  Findings:       Impressions:    - Normal LV size with moderate to severe systolic dysfunction, EF     30-35%. Severe hypokinesis of the anterior wall, anteroseptum,        and apex. Normal RV size with mild systolic dysfunction with the     RV apex appearing akinetic.   CT of Chest  Procedure date:  12/19/2010  Findings:       CT ANGIOGRAPHY CHEST WITH CONTRAST    Technique:  Multidetector CT imaging of the chest was performed   using the standard protocol during bolus administration of   intravenous contrast.  Multiplanar CT image reconstructions   including MIPs were obtained to evaluate the vascular anatomy.    Contrast:  80 ml Omnipaque-300    Comparison:  Chest radiograph 06/05/2006    Findings:  No pulmonary embolus.  No pathologically enlarged   mediastinal, hilar or axillary lymph nodes.  Coronary artery   calcification.  Heart is enlarged.  No pericardial effusion.   Pacemaker lead tip terminates in the right atrium.  ICD lead tip   terminates in the right ventricle.  Tiny hiatal hernia.    Mild dependent atelectasis.  Probable scarring in the lingula and   right middle lobe.  Lungs are otherwise clear.  No pleural fluid.   Airway  is unremarkable.    Incidental imaging of the upper abdomen shows a sub centimeter low attenuation lesion in the dome of the liver.  No worrisome lytic or sclerotic lesions.    Review of the MIP images confirms the above findings.    IMPRESSION:    1.  No pulmonary embolus.   2.  No findings to explain the patient's given symptoms.   3.  Cardiac enlargement with coronary artery calcification  Cardiac Cath  Procedure date:  01/03/2011  Findings:      Right atrial pressure mean of 1.  RV pressure 23/1 with EDP of 4.  PA pressure 22/3 with the mean of 11.  Fick cardiac output 4.2 with a index 2.1.  Pulmonary capillary wedge pressure with mean of 3-4.  Central aortic pressure was 92/60 with a mean of 75.  LV pressure 108/6  with a mean of 10.   Left main had mild calcification, but no obstructive clot.  LAD was a long vessel coursing through the apex.  Had a very long area of stenting for the proximal portion through the midportion.  The stents were patent.  There was approximately 20-30% in-stent restenosis throughout, with infection throughout, but no high grade lesion.  There was a first diagonal that was patent.   Left circumflex was a large dominant vessel, gave off a large OM1, small OM2, posterolateral, PDA.  There is a 45% trivial stenosis through the proximal midportion of the left circumflex, 30% lesion in the mid AV groove circumflex.   Right coronary artery was a small nondominant vessel with no high grade stenosis.   Left ventriculogram done in the RAO position showed EF of approximately 40-45% with distal anterior wall and apical dyskinesis.  There was evidence of dyssynchrony.   ASSESSMENT: 1. Coronary artery disease with patent LAD stent and otherwise,     nonobstructive coronary disease. 2. Normal right-sided pressures with normal cardiac output. 3. Left ventricular dysfunction with ejection fraction of 40-45%.   Preventive Screening-Counseling & Management  Alcohol-Tobacco     Smoking Status: never  Current Medications (verified): 1)  Lisinopril-Hydrochlorothiazide 20-12.5 Mg Tabs (Lisinopril-Hydrochlorothiazide) .... Once Daily 2)  Avodart 0.5 Mg Caps (Dutasteride) .... Take 1 By Mouth Three Times A Week 3)  Lipitor 10 Mg Tabs (Atorvastatin Calcium) .[name].. 1 Once Daily 4)  Zetia 10 Mg Tabs (Ezetimibe) .... Take One Tablet By Mouth Daily. 5)  Plavix 75 Mg Tabs (Clopidogrel Bisulfate) .... Take One Tablet By Mouth Daily 6)  Oscal 500/200 D-3 500-200 Mg-Unit Tabs (Calcium-Vitamin D) .... Once Daily 7)  Multivitamins  Tabs (Multiple Vitamin) .... Take 1 By Mouth Once Daily 8)  Naproxen Sodium 220 Mg Tabs (Naproxen Sodium) .... Take 2 By Mouth Two Times A Day 9)  Aspirin 81 Mg Tbec (Aspirin)  .... Take 2 Tablets  By Mouth Daily  Allergies (verified): 1)  ! Sulfa 2)  ! * Oxycodone  Past History:  Past Medical History: Medtronic Maximo DR [phone] Coronary artery disease-stent to a 95%-occluded LAD in 1995. Recurrent syncope Cardiac arrest Ischemic cardiomyopathy, ejection fraction 35% to 45% Severe bradycardia Hyperlipidemia TIA Nov. 2011 GERD Allergic rhinitis Dyspnea      - PFT 12/19/10: FEV1 2.93(107%), FEV1% 72, TLC 8.02(138%), DLCO 110%, +BD  Past Surgical History: Implantation of Medtronic dual-chamber cardioverter Tonsillectomy 1948 Left knee replacement 2006 FH reviewed for relevance  Family History: Father - Emphysema, Lung Cancer  Social History: Married with 4 children.  Retired Art gallery manager.  Never smoked.  Has 2  glasses of alcohol per day.Smoking Status:  never  Review of Systems       The patient complains of shortness of breath with activity, nasal congestion/difficulty breathing through nose, and joint stiffness or pain.  The patient denies shortness of breath at rest, productive cough, non-productive cough, coughing up blood, chest pain, irregular heartbeats, acid heartburn, indigestion, loss of appetite, weight change, abdominal pain, difficulty swallowing, sore throat, tooth/dental problems, headaches, sneezing, itching, ear ache, anxiety, depression, hand/feet swelling, rash, change in color of mucus, and fever.    Vital Signs:  Patient profile:   71 year old [name] Height:      67 inches Weight:      187.8 pounds BMI:     29.52 O2 Sat:      98 % on Room air Temp:     97.9 degrees F oral Pulse rate:   69 / minute BP sitting:   118 / 78  (left arm)  Vitals Entered By: Renold Genta RCP, LPN (February 06, 2011 3:11 PM)  O2 Flow:  Room air CC: Pulmonary Consult, c/o faigue in legs during exercisre,no stamina , naps 1-2 times during day, denies wheezing or cough  Comments Medications reviewed with patient Renold Genta RCP, LPN  February 06, 2011 3:21 PM    Physical Exam  General:  normal appearance and healthy appearing.   Eyes:  PERRLA and EOMI.   Nose:  clear nasal discharge, no sinus tenderness Mouth:  MP 3, no exudate Neck:  no JVD.   Chest Wall:  no deformities noted Lungs:  clear bilaterally to auscultation and percussion Heart:  regular rate and rhythm, S1, S2 without murmurs, rubs, gallops, or clicks Abdomen:  bowel sounds positive; abdomen soft and non-tender without masses, or organomegaly Msk:  no deformity or scoliosis noted with normal posture Pulses:  pulses normal Extremities:  no clubbing, cyanosis, edema, or deformity noted Neurologic:  normal CN II-XII.   Skin:  intact without lesions or rashes Cervical Nodes:  no significant adenopathy Psych:  alert and cooperative; normal mood and affect; normal attention span and concentration   Impression & Recommendations:  Problem # 1:  SHORTNESS OF BREATH (ICD-786.05) He likely has asthma and allergic rhinitis as the cause of his current symptoms.  He did have evidence for bronchodilator response and small airway obstruction on recent PFT.  To treat his asthma symptoms will start Qvar.  Will also use zafirlukast for his asthma and allergic rhinits symptoms.   He does have history of ischemic cardiomyopathy with EF approx. 40%, but appears to be well compensated.  He has undergone extensive evaluation otherwise w/o evidence for any additional cause of his dyspnea.  Medications Added to Medication List This Visit: 1)  Qvar 80 Mcg/act Aers (Beclomethasone dipropionate) .... Two puffs two times a day 2)  Zafirlukast 20 Mg Tabs (Zafirlukast) .... One two times a day 3)  Lipitor 10 Mg Tabs (Atorvastatin calcium) .[name].. 1 once daily  Complete Medication List: 1)  Qvar 80 Mcg/act Aers (Beclomethasone dipropionate) .... Two puffs two times a day 2)  Zafirlukast 20 Mg Tabs (Zafirlukast) .... One two times a day 3)  Lisinopril-hydrochlorothiazide 20-12.5 Mg Tabs  (Lisinopril-hydrochlorothiazide) .... Once daily 4)  Lipitor 10 Mg Tabs (Atorvastatin calcium) .[name].. 1 once daily 5)  Zetia 10 Mg Tabs (Ezetimibe) .... Take one tablet by mouth daily. 6)  Plavix 75 Mg Tabs (Clopidogrel bisulfate) .... Take one tablet by mouth daily 7)  Aspirin 81 Mg Tbec (Aspirin) .... Take  2 tablets  by mouth daily 8)  Avodart 0.5 Mg Caps (Dutasteride) .... Take 1 by mouth three times a week 9)  Oscal 500/200 D-3 500-200 Mg-unit Tabs (Calcium-vitamin d) .... Once daily 10)  Multivitamins Tabs (Multiple vitamin) .... Take 1 by mouth once daily 11)  Naproxen Sodium 220 Mg Tabs (Naproxen sodium) .... Take 2 by mouth two times a day  Patient Instructions: 1)  Qvar two puffs two times a day, and rinse your mouth after using 2)  Zafirlukast 20 mg two times a day  3)  Follow up in 4 weeks Prescriptions: ZAFIRLUKAST 20 MG TABS (ZAFIRLUKAST) one two times a day  #60 x 3   Entered and Authorized by:   Coralyn Helling MD   Signed by:   Coralyn Helling MD on 02/06/2011   Method used:   Electronically to        [facility] [phone]* (retail)       [address] [place], [place]  [address]       Ph: [phone] or [phone]       Fax: [phone]   RxID:   [id number] QVAR 80 MCG/ACT AERS (BECLOMETHASONE DIPROPIONATE) two puffs two times a day  #1 x 3   Entered and Authorized by:   Coralyn Helling MD   Signed by:   Coralyn Helling MD on 02/06/2011   Method used:   Electronically to        [facility] [phone]* (retail)       [address] [place], [place]  [address]       Ph: [phone] or [phone]       Fax: [phone]   RxID:   [id number]   Appended Document: consult for sob//sh    Clinical Lists Changes  Orders: Added new Service order of Consultation Level V [phone]) - Signed

## 2011-02-17 NOTE — Assessment & Plan Note (Signed)
Summary: 4-6 week f/u per scott/sl   Visit Type:  ICD-Medtronic  Referring Provider:  [name] Primary Provider:  Dr. Fidela Juneau. [place], [place]  CC:  sob with exercise.  History of Present Illness: [name] is  seen in followup for   ischemic heart disease depressed left ventricular function and first and second degree heart block. He is s/p meditronic DD-ICD implantation.  He has been undergoing an evaluation forr dyspnea with exertion. this included a recent catheterization by Dr. Dorthea Cove demonstrating 1. Coronary artery disease with patent LAD stent and otherwise,     nonobstructive coronary disease. 2. Normal right-sided pressures with normal cardiac output. 3. Left ventricular dysfunction with ejection fraction of 40-45%.  He underwent CPX demonstrating an normal heart excursion a VO2 max of 22.4.  he is currently being seen by pulmonary who thinks that there's a reactive airways component; therapy has been initiated. There has heretofore been no change.     Preventive Screening-Counseling & Management  Caffeine-Diet-Exercise     Does Patient Exercise: yes  Current Medications (verified): 1)  Qvar 80 Mcg/act Aers (Beclomethasone Dipropionate) .... Two Puffs Two Times A Day 2)  Zafirlukast 20 Mg Tabs (Zafirlukast) .... One Two Times A Day 3)  Lisinopril-Hydrochlorothiazide 20-12.5 Mg Tabs (Lisinopril-Hydrochlorothiazide) .... Once Daily 4)  Lipitor 10 Mg Tabs (Atorvastatin Calcium) .[name].. 1 Once Daily 5)  Zetia 10 Mg Tabs (Ezetimibe) .... Take One Tablet By Mouth Daily. 6)  Plavix 75 Mg Tabs (Clopidogrel Bisulfate) .... Take One Tablet By Mouth Daily 7)  Aspirin 81 Mg Tbec (Aspirin) .... Take 2 Tablets  By Mouth Daily 8)  Avodart 0.5 Mg Caps (Dutasteride) .... Take 1 By Mouth Three Times A Week 9)  Oscal 500/200 D-3 500-200 Mg-Unit Tabs (Calcium-Vitamin D) .... Once Daily 10)  Multivitamins  Tabs (Multiple Vitamin) .... Take 1 By Mouth Once Daily 11)  Naproxen Sodium 220  Mg Tabs (Naproxen Sodium) .... Take 2 By Mouth Two Times A Day  Allergies (verified): 1)  ! Sulfa 2)  ! * Oxycodone   Past History:  Past Medical History: Last updated: 02/06/2011 Medtronic Maximo DR [id number] Coronary artery disease-stent to a 95%-occluded LAD in 1995. Recurrent syncope Cardiac arrest Ischemic cardiomyopathy, ejection fraction 35% to 45% Severe bradycardia Hyperlipidemia TIA Nov. 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  Never smoked.  Has 2 glasses of alcohol per day. Regular Exercise - yes  Risk Factors: Exercise: yes (02/11/2011)  Risk Factors: Smoking Status: never (02/06/2011)  Social History: Married with 4 children.  Retired Art gallery manager.  Never smoked.  Has 2 glasses of alcohol per day. Regular Exercise - yes Does Patient Exercise:  yes  Vital Signs:  Patient profile:   70 year old male Height:      67 inches Weight:      185 pounds BMI:     29.08 Pulse rate:   71 / minute BP sitting:   138 / 85  (left arm)  Vitals Entered By: Caralee Ates CMA (February 11, 2011 10:31 AM)  Physical Exam  General:  The patient was alert and oriented in no acute distress. HEENT Normal.  Neck veins were flat, carotids  were brisk.  Lungs were clear.  Heart sounds were regular without murmurs or gallops.  Abdomen was soft with active bowel sounds. There is no clubbing cyanosis or edema. Skin Warm and dry     ICD Specifications Following MD:  Willa Rough, MD     ICD Vendor:  Medtronic     ICD Model Number:  [phone]     ICD Serial Number:  [id number] ICD DOI:  06/04/2006      ICD Implanting MD:  Sherryl Manges, MD  Lead 1:    Location: RA     DOI: 06/04/2006     Model #: [id number]     Serial #: [id number]     Status: active Lead 2:    Location: RV     DOI: 06/04/2006     Model #: [id number]     Serial #: [id number]     Status: active  Indications::  ICM   ICD Follow Up ICD Dependent:  Yes      Episodes Coumadin:  No  Brady Parameters Mode DDDR     Lower Rate Limit:  60     Upper Rate Limit 140 PAV 250     Sensed AV Delay:  250  Tachy Zones VF:  200     VT:  250     VT1:  176     Impression & Recommendations:  Problem # 1:  SYSTOLIC HEART FAILURE, 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 Once daily    Plavix 75 Mg Tabs (Clopidogrel bisulfate) .[name]... Take one tablet by mouth daily    Aspirin 81 Mg Tbec (Aspirin) .[name]... Take 2 tablets  by mouth daily  His updated medication list for this problem includes:    Lisinopril-hydrochlorothiazide 20-12.5 Mg Tabs (Lisinopril-hydrochlorothiazide) ..... Once daily    Plavix 75 Mg Tabs (Clopidogrel bisulfate) .[name]... Take one tablet by mouth daily    Aspirin 81 Mg Tbec (Aspirin) .[name]... Take 2 tablets  by mouth daily  Problem # 2:  6949 LEAD (ICD-996.04) stable; alert reiterated  Problem # 3:  CARDIOMYOPATHY, ISCHEMIC (ICD-414.8) he is status post stenting. I have reviewed with Dr. Zara Chess the eighth as to whether Plavix is important to maintain. In the event that he received a drug-eluting stent in a state o STEMI  the answers yes; otherwise essentially no we will review the records from [facility]    Lisinopril-hydrochlorothiazide 20-12.5 Mg Tabs (Lisinopril-hydrochlorothiazide) ..... Once daily    Plavix 75 Mg Tabs (Clopidogrel bisulfate) .[name]... Take one tablet by mouth daily    Aspirin 81 Mg Tbec (Aspirin) .[name]... Take 2 tablets  by mouth daily  Problem # 4:  IMPLANTABLE   DEFIBRILLATOR  MDT (ICD-V45.02) Device parameters and data were reviewed and no changes were made  his battery voltage suggests ERI and 4 for months.at that time his 6949 lead will need revision; currently his EF is above 35 % so no clear indiation for CRT  Patient Instructions: 1)  Your physician recommends that you schedule a follow-up appointment in: [place] IN MAY  2)  Your physician recommends that you continue on your current medications as directed. Please refer to the Current Medication list given to you today.

## 2011-02-26 NOTE — Cardiovascular Report (Signed)
Summary: Office Visit   Office Visit   Imported By: Roderic Ovens 02/19/2011 16:04:28  _____________________________________________________________________  External Attachment:    Type:   Image     Comment:   External Document

## 2011-03-09 ENCOUNTER — Encounter: Payer: Self-pay | Admitting: Pulmonary Disease

## 2011-03-09 ENCOUNTER — Ambulatory Visit (INDEPENDENT_AMBULATORY_CARE_PROVIDER_SITE_OTHER): Payer: Medicare Other | Admitting: Pulmonary Disease

## 2011-03-09 DIAGNOSIS — R0602 Shortness of breath: Secondary | ICD-10-CM

## 2011-03-19 NOTE — Assessment & Plan Note (Signed)
Summary: 4 WEEK ROV//SH   Copy to:  [name] Primary Provider/Referring Provider:  Dr. Fidela Juneau. [place], [place]  CC:  4 week follow up. Pt states his breathing has been doing "okay". Pt c/o being tired often and still has some sinus drainage. Pt denies any cough.  History of Present Illness: 70 yo male with dyspnea, asthma, ischemic cardiomyopathy with EF 40%.  His breathing has improved.  His sinuses are the best they have been in years.  He is not having much cough or wheeze.  He still feels his exercise tolerance is limited, but does okay with routing activity.  He was told by cardiology that he may need cardiac resynchronization, and that this may be affecting his exercise tolerance.    Current Medications (verified): 1)  Qvar 80 Mcg/act Aers (Beclomethasone Dipropionate) .... Two Puffs Two Times A Day 2)  Zafirlukast 20 Mg Tabs (Zafirlukast) .... One Two Times A Day 3)  Lisinopril-Hydrochlorothiazide 20-12.5 Mg Tabs (Lisinopril-Hydrochlorothiazide) .... Once Daily 4)  Lipitor 10 Mg Tabs (Atorvastatin Calcium) .[name].. 1 Once Daily 5)  Zetia 10 Mg Tabs (Ezetimibe) .... Take One Tablet By Mouth Daily. 6)  Plavix 75 Mg Tabs (Clopidogrel Bisulfate) .... Take One Tablet By Mouth Daily 7)  Aspirin 81 Mg Tbec (Aspirin) .... Take 2 Tablets  By Mouth Daily 8)  Avodart 0.5 Mg Caps (Dutasteride) .... Take 1 By Mouth Three Times A Week 9)  Oscal 500/200 D-3 500-200 Mg-Unit Tabs (Calcium-Vitamin D) .... Once Daily 10)  Multivitamins  Tabs (Multiple Vitamin) .... Take 1 By Mouth Once Daily 11)  Naproxen Sodium 220 Mg Tabs (Naproxen Sodium) .... Take 2 By Mouth Two Times A Day  Allergies (verified): 1)  ! Sulfa 2)  ! * Oxycodone  Past History:  Past Medical History: Reviewed history from 02/06/2011 and no changes required. Medtronic Maximo DR [phone] Coronary artery disease-stent to a 95%-occluded LAD in 1995. Recurrent syncope Cardiac arrest Ischemic cardiomyopathy, ejection fraction 35%  to 45% Severe bradycardia Hyperlipidemia TIA Nov. 2011 GERD Allergic rhinitis Dyspnea      - PFT 12/19/10: FEV1 2.93(107%), FEV1% 72, TLC 8.02(138%), DLCO 110%, +BD  Past Surgical History: Reviewed history from 02/06/2011 and no changes required. Implantation of Medtronic dual-chamber cardioverter Tonsillectomy 1948 Left knee replacement 2006  Vital Signs:  Patient profile:   70 year old male Height:      67 inches Weight:      185.38 pounds BMI:     29.14 O2 Sat:      97 % on Room air Temp:     97.9 degrees F oral Pulse rate:   62 / minute BP sitting:   114 / 72  (left arm) Cuff size:   regular  Vitals Entered By: Carver Fila (March 09, 2011 11:22 AM)  O2 Flow:  Room air CC: 4 week follow up. Pt states his breathing has been doing "okay". Pt c/o being tired often, still has some sinus drainage. Pt denies any cough Comments meds and allergies updated Phone number updated Mindy Silva  March 09, 2011 11:22 AM    Physical Exam  General:  normal appearance and healthy appearing.   Nose:  clear nasal discharge, no sinus tenderness Mouth:  MP 3, no exudate Neck:  no JVD.   Lungs:  clear bilaterally to auscultation and percussion Heart:  regular rate and rhythm, S1, S2 without murmurs, rubs, gallops, or clicks Extremities:  no clubbing, cyanosis, edema, or deformity noted Neurologic:  normal CN II-XII.  Cervical Nodes:  no significant adenopathy   Impression & Recommendations:  Problem # 1:  SHORTNESS OF BREATH (ICD-786.05)  Likely a combination of asthma with allergic rhinitis and ischemic cardiomyopathy.  He has improvement with his current asthma/allergy regimen, and will continue same.    He is to d/w Dr. Graciela Husbands about whether he needs to have further therapies for his heart function.  Complete Medication List: 1)  Qvar 80 Mcg/act Aers (Beclomethasone dipropionate) .... Two puffs two times a day 2)  Zafirlukast 20 Mg Tabs (Zafirlukast) .... One two times a  day 3)  Lisinopril-hydrochlorothiazide 20-12.5 Mg Tabs (Lisinopril-hydrochlorothiazide) .... Once daily 4)  Lipitor 10 Mg Tabs (Atorvastatin calcium) .[name].. 1 once daily 5)  Zetia 10 Mg Tabs (Ezetimibe) .... Take one tablet by mouth daily. 6)  Plavix 75 Mg Tabs (Clopidogrel bisulfate) .... Take one tablet by mouth daily 7)  Aspirin 81 Mg Tbec (Aspirin) .... Take 2 tablets  by mouth daily 8)  Avodart 0.5 Mg Caps (Dutasteride) .... Take 1 by mouth three times a week 9)  Oscal 500/200 D-3 500-200 Mg-unit Tabs (Calcium-vitamin d) .... Once daily 10)  Multivitamins Tabs (Multiple vitamin) .... Take 1 by mouth once daily 11)  Naproxen Sodium 220 Mg Tabs (Naproxen sodium) .... Take 2 by mouth two times a day  Other Orders: Est. Patient Level III ([address])  Patient Instructions: 1)  Follow up in September2012

## 2011-03-30 ENCOUNTER — Telehealth: Payer: Self-pay | Admitting: Internal Medicine

## 2011-03-30 NOTE — Telephone Encounter (Signed)
Returning your call. °

## 2011-03-30 NOTE — Telephone Encounter (Signed)
LMOM for call back. 

## 2011-03-30 NOTE — Telephone Encounter (Signed)
Called patient's wife back. She is concerned that he needs a pacer change  out in May and he has an out of town trip scheduled for June 6th. She asked that we call him back with an appointment. Called patient back and asked him to complete a [facility] Transmission today and then Belenda Cruise B ( pacer tech) will follow up with him with a plan. Patient verbalized understanding.

## 2011-05-05 NOTE — Letter (Signed)
September 04, 2008    Reola Mosher. Randa Lynn, MD  [facility]  [address]  [place], [place] [address]   RE:  [name], [name]  MRN:  [id number]  /  DOB:  May 01, 1941   Dear Greig Castilla,   It was the pleasure to see you, albeit very briefly, the other day.  Mr.  Popper came in today in followup for his syncope in the setting of  ischemic cardiomyopathy and status post ICD implantation.   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  We  have tried Coreg and metoprolol.  He is not interested in taking others  because it has such a significant  impact on his quality of life.   OTHER MEDICATIONS:  1. Plavix 75.  2. Lipitor.  3. Lisinopril 20.  4. Zetia 10.  5. Naprosyn.  6. Avodart.   PHYSICAL EXAMINATION:  VITAL SIGNS:  Blood pressure is 137/80 with pulse  of 70.  His weight was 189, which is up 8 pounds.  LUNGS:  Clear.  HEART:  Sounds were regular.  EXTREMITIES:  Without edema.   Interrogation of his Medtronic ICD demonstrates P-wave of 1.5 with  impedance of 528, threshold of 1 volt at 0.3 in both chambers.  The R-  wave was 11.7 with impedance of 424.  He is 100% atrially and  ventricularly paced even at long AV delays.   IMPRESSION:  1. Syncope with no evidence of significant conduction system disease      and sinus node dysfunction.  2. Status post dual-mode, dual-pacing, dual-sensing (pacemaker)      implantation for the above.  3. 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  4. Intolerance of BETA-BLOCKERS.   Mardelle Matte, Mr. Glasscock is doing really very well.  I think the quality of  life issues to proceed his benefit issues, I think especially given his  relatively well-preserved (?) left ventricular function.   I do think at some point it would be valuable to reassess his left  ventricular function as right ventricular apical pacing may  cause  deterioration here, and we would like to know that.   I will plan to see him again for device followup.      Sincerely,      Duke Salvia, MD, [facility]  Electronically Signed    SCK/MedQ  DD: 09/04/2008  DT: 09/05/2008  Job #: [id number]

## 2011-05-05 NOTE — Letter (Signed)
August 09, 2007    Marcina Millard, MD  [facility]  [address]  [place], [place]  [address]   RE:  [name], [name]  MRN:  [id number]  /  DOB:  1941/10/09   Dear Trinna Post:   I hope this letter finds you well.  Mr. Dunklee comes in.  He is  feeling terrific.  He is working out on his elliptical, he is working  part time.  He stopped his Coreg and his fatigue is much improved.   His other medications are notable for Plavix, Lipitor, Lisinopril,  Zetia, and Avodart and aspirin.   PHYSICAL EXAMINATION:  His blood pressure today was 135/86, his pulse  was 71.  LUNGS:  Clear.  Heart sounds were regular.  EXTREMITIES:  Without edema.   Interrogation of his Medtronic [phone] ICD demonstrates an R-wave of 10.4  with an impedance of 392 with a threshold of 1 volt at 0.3.  The P-wave  was 2.1, impedance of 464, and threshold of 1 volt at 0.2.  Battery  voltage is 3.11.  He has intermittent complete heart block.   Ms. Magnan is doing well, Alex.  I have given him prescriptions  today for bisoprolol 2.5 and Toprol 12.5 to see if he can find a beta  blocker that is tolerable from the side effect profile.   He is to follow up with you as previously scheduled.    Sincerely,      Duke Salvia, MD, [facility]  Electronically Signed    SCK/MedQ  DD: 08/09/2007  DT: 08/09/2007  Job #: [id number]

## 2011-05-05 NOTE — Letter (Signed)
April 11, 2007    Marcina Millard, MD  [facility]  [address]  [place], [place]  [address]   RE:  [name], [name]  MRN:  [id number]  /  DOB:  1941/06/27   Dear Trinna Post:   I hope this letter finds you and your wife well.  I miss not seeing you  as much I did.   [name] came in today complaining of still problems with exercise  intolerance.  He was thinking that his heart rate was not exceeding 100  when he was walking and that he has been worse since the pacemaker went  in.  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  When I saw him in January, we stopped  his Coreg.  He feels some better but he still has problems with not  feeling as well as he did before.   His medications currently include just the Zetia, lisinopril 20, Lipitor  10 and Plavix 75.  As noted, he is not taking Coreg currently.  On  examination today his blood pressure is 133/90, his pulse is 75, lungs  were clear, heart sounds were regular and extremities were without  edema.   We then undertook a series of limited treadmills at various program  settings on his heart rate slope with his Medtronic rate sensor.  He  thought that when it moved from nominal to 8 that he was some better and  that his heart rate was limited so we put him on 9 to see what happened  and within stage 1 his heart rate was at 135.  Through these series of  repeat treadmills we finally have him programmed at 5 which may still be  one setting too brisk but he is able to complete stage 2 at a heart rate  of about 100, stage 3 he got up to about 125 and he is going to let us  know how it is that he does with this.  Like I say, he may benefit from  going further down to 5.   I would also like to think about getting him back  on his Coreg when he  sees you next and gradual up-titration to see how he tolerates that  because that is clearly the most important thing that we can do for his  long-term well being.   Alex, please let me know if there is anything I can do further to help  with him in the interim.    Sincerely,      Ronnie Salvia, MD, [facility]  Electronically Signed    SCK/MedQ  DD: 04/11/2007  DT: 04/11/2007  Job #: [id number]

## 2011-05-08 NOTE — Discharge Summary (Signed)
[name], [name]            ACCOUNT NO.:  [id number]   MEDICAL RECORD NO.:  [id number]          PATIENT TYPE:  INP   LOCATION:  6523                         FACILITY:  [facility]   PHYSICIAN:  Maple Mirza, P.A. DATE OF BIRTH:  1941-10-18   DATE OF ADMISSION:  06/04/2006  DATE OF DISCHARGE:                                 DISCHARGE SUMMARY   ALLERGIES:  THIS PATIENT HAS AN ALLERGY TO SULFA AND OXYCODONE.   PREPARATION TIME:  This dictation and preparation took 30 minutes.   PRINCIPAL DIAGNOSES:  1.  Discharged on day 1 status post implantation Medtronic Maximo DR 7278      cardioverted defibrillator.  This is a dual-chamber model.  The cardiac      resynchronization lead gave diaphragmatic stimulation and was removed      (the procedure was a long one).  2.  History of recurrent syncope.  3.  History of cardiac arrest.  4.  Coronary artery disease.      1.  stent to a 95%-occluded LAD in 1995.      2.  Second stent placed in the setting of syncope/bradycardia.  5.  Ischemic cardiomyopathy, ejection fraction 35% to 45%.  6.  Imaging:  Wall motion abnormalities, perfusion defect in the anterior      wall.  7.  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  8.  Severe bradycardia, obviating high doses of beta-blocker.   SECONDARY DIAGNOSES:  1.  Gastroesophageal reflux disease.  2.  Dyslipidemia.   PROCEDURE:  06/04/2005:  Implantation of Medtronic dual-chamber cardioverted  defibrillator by Dr. Sherryl Manges.  Defibrillator threshold study was less  than or equal to 15 joules.  CRT lead placement failed secondary to  persistent diaphragmatic stimulation.   BRIEF HISTORY:  [name] is a 70 year old male, quite active, who has  a history of ischemic cardiomyopathy.  He has suffered a cardiac arrest in  1995.  At that time he was found to have a high-grade left anterior  descending occlusion and underwent stenting.  Since that time he has had 3  subsequent  episodes of syncope, one of which occurred in the setting of  severe bradycardia requiring atropine, and found to have a 95% LAD lesion by  catheterization.  This also was stented.  At that time a study described his  heart function as normal.   Repeat assessments of the left ventricular function by nuclear study and  echocardiography show ejection fractions in the 35% to 45% range.  He has  anterior wall motion abnormalities and a perfusion deficit in the nuclear  images.   Mr. [name] has recurrent syncope in the setting of  ischemic heart  disease, wall motion defects, and an ejection fraction estimated in the 35%  to 40% range.  He also has significant bradycardia, which precludes high-  level beta-blocker therapy.  This patient's history of appropriate use of  implantable cardioverter defibrillator is equivalent to those patients who  have ventricular tachycardia/ventricular fibrillation with ischemic  cardiomyopathy and a depressed ejection fraction.  Therefore, it is  recommended that the patient have a dual-chamber,  cardioverter defibrillator  implanted.  This will allow treatment with beta-blockers to protect against  tachyarrhythmias.  There is also the question of cardiac resynchronization  therapy.  This would be an ideal situation, since the patient is at high  risk for ventricular pacing.  The risks and benefits have been described to  the patient and his wife, and they have elected to proceed.   HOSPITAL COURSE:  Patient presented electively 06/04/2006.  He underwent  implantation of a Medtronic Maximo dual-chamber cardioverter defibrillator.  Left ventricular lead was placed and successfully replaced.  Each time it  had to be readjusted secondary to persistent diaphragmatic pacing.  It was  really our desire to provide this patient with biventricular pacing  secondary to his age, and also due to the fact that with appropriate therapy  he will require  ventricular  pacing.  He discharges 06/05/2006 after  inspection of the chest x-ray, after interrogation of the device, and after  mobility of the left arm has been discussed.  He is asked not to drive for  the next week.  He is asked to keep his incision dry for the next 7 days,  and sponge-bathe until Friday, 06/11/2006.   MEDICATIONS AT DISCHARGE:  Include:  1.  New medication Coreg 3.125 mg twice daily.  2.  Plavix 75 mg daily.  3.  Lipitor 10 mg daily.  4.  Lisinopril 20 mg daily.  5.  Zetia 10 mg  daily.  6.  Avodart 0.5 mg daily.  7.  Multivitamin daily.   PLAN:  1.  He follows up at [facility] [address]  ICD      Clinic Wednesday, 06/16/2006 at 9:40.  2.  To see Dr. Graciela Husbands Tuesday, 09/28/2006 at 9:10.      Maple Mirza, P.A.     GM/MEDQ  D:  06/04/2006  T:  06/04/2006  Job:  [id number]   cc:   Duke Salvia, M.D.  [address]  [address]  [place]  [place] [address]   Dr. Marcina Millard  [facility], [place]   Dr. Alonna Buckler  [facility]

## 2011-05-08 NOTE — Op Note (Signed)
[name], [name]            ACCOUNT NO.:  [id number]   MEDICAL RECORD NO.:  [id number]          PATIENT TYPE:  INP   LOCATION:  2899                         FACILITY:  [facility]   PHYSICIAN:  Duke Salvia, M.D.  DATE OF BIRTH:  November 10, 1941   DATE OF PROCEDURE:  06/04/2006  DATE OF DISCHARGE:                                 OPERATIVE REPORT   .   PREOPERATIVE DIAGNOSES:  1.  Syncope with ischemic heart disease and depressed left ventricular      function.  2.  Class II heart failure.  3.  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   POSTOPERATIVE DIAGNOSES:  1.  Syncope with ischemic heart disease and depressed left ventricular      function.  2.  Class II heart failure.  3.  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   OPERATION/PROCEDURE:  Dual-chamber defibrillator implantation with insertion  and subsequent removal of a left ventricular pacing lead secondary to  universal diaphragmatic stimulation.   DESCRIPTION OF PROCEDURE:  Following obtaining informed consent, the patient  was brought to the electrophysiology laboratory and placed on the  fluoroscopic table in the supine position.  After routine prep and drape of  the left upper chest, lidocaine was infiltrated in the prepectoral  subclavicular region.  An incision was made and carried down to the  prepectoral fascia using electrocautery and sharp dissection.  A pocket was  formed similarly.  Hemostasis was obtained.   Thereafter, attention was turned to gaining access to the extrathoracic left  subclavian vein which was accomplished without difficulty without the  aspiration or puncture of the artery.  Three separate venipunctures were  accomplished.  Guidewires were placed and retained.  A 0 silk suture was  placed around the two more cephalad wires and allowed to hang loosely.   Over the ___________ guidewire was placed a 7-French sheath through which  was then  passed a Medtronic 6949 65 cm dual coil active-fixation  defibrillator lead, serial number #[id number].  Under fluoroscopic guidance,  it was  manipulated into the distal portion of the RV septum near the apex  where the bipolar R wave of 8.4 MV with a pace impedance of 1245 ohms and a  threshold of 1.2 volts at 0.5 Msec.  Current threshold was about 1.2 MA.  There was no diaphragmatic pacing at 10 volts.  This lead was secured to the  prepectoral fascia and then over the most caudal guidewire was placed 9.5-  French sheath through which was then placed a Medtronic MB2 coronary sinus  cannulation catheter.  The coronary sinus was cannulated with minimal  difficulty.  It was noted that the right atrium was very, very large,  however.  Balloon occlusive venography demonstrated three lateral _________  branches.  One was in line with the middle cardiac vein. One was very  posterior.  The lateral wall demonstrated no branches at all and then there  was a high anterolateral branch.  It was elected to pursue the high  anterolateral branch first.  After modest difficulty, I  was able to get a  Whisper wire down this branch.  We then took a 4194 Medtronic bipolar lead  and tried to force it over this wire.  However, it could not pass past the  junction of the vein with the coronary sinus body.  We tried manipulating  the wire, putting a ________ on the wire and various formations, but we were  not able to pass the lead across that.   At this point, we decided to pursue the posterior branch.  Given its very  sharp angulation, we used and attained 135 degree select sheath.  This  allowed [place], with only modest difficulty, to cannulate this branch and we  then placed a wire into this branch.  As we tried to manipulate the sheaths,  however, everything slipped off because the orifice in this branch and the  steepness of the coronary sinus body and the size of the right atrium did  not allow enough  support to manipulate within this portion of the coronary  sinus.  At this point because of the inability to use the Attain 1  system  because of the body of the coronary sinus, we used the Attain 2  system and  a 4193 Medtronic lead.  We were able to recannulate this posterior vein and  we passed the 4193.  Along its entire course, however, we saw diaphragmatic  stimulation at outputs above 2 to 3 volts.  We then elected to try and use  the Attain 2 system on the high anterolateral branch.  This took again a  great deal of time and difficulty to cannulate this vein. We were  subsequently able to do it.  We then attempted to use the dilator for the  Attain 2 system to course over the wire as it is very flexible.  This, too,  however, was not able to be passed over the junction of the vein branch with  the coronary sinus body.   At this point I decided to try one further thing which was to recannulate  the posterior vein which we did using a Mailman wire.  The intention of this  was to provide enough support so that the Attain 2  delivery sheath could be  removed and then the plan was to place a EZ track to lead from guidance  through the delivery system into this branch as it potentially has the  option of bipolar pacing and with a smallest bipolar, we might avoid  diaphragmatic stimulation. However, again we ran into the same issue that we  had before.  That is, that there was insufficient support in the delivery  system given the very proximal take-off of this vein and the very steep  nature of the coronary sinus at this point to allow for this delivery.  At  this point the LV delivery system was removed.  Over the last retained  guidewire was placed a 7-French sheath and through this was passed a  Medtronic 5076 52 cm active-fixation bipolar atrial lead, serial  #[id number].  This was manipulated into the right atrial appendage where the bipolar P wave was 2.4 MV with a pacing impedance  of 681 ohms, a threshold  of 2.3 volts at 0.5 Msec, current threshold of 2.6 MA.   With these acceptable parameters recorded, the leads were then attached to a  Medtronic Maximum [id number] ICD, serial #[id number].  Through the device the  bipolar P wave was 2 with pacing impedance of 552, a  threshold of 2 volts at  0.3.  The R wave was 12.9 with impedance of 728, a threshold of 1 volt at  0.3.  Proximal coil impedance was 51 ohms.  Distal coil impedance was 42  ohms.   At this point, defibrillation threshold testing was undertaken.  Ventricular  fibrillation was induced via the T wave shock.  After a total duration of  6.5 seconds a 15-joule shock was delivered through a measured resistance of  38 ohms terminating ventricular fibrillation, restoring a paced rhythm.   After a wait of five to six minutes, ventricular fibrillation was reinduced  via the T wave shock.  After a total duration of 6.5 seconds, a 15-joule  shock was delivered through a measured resistance of 37 ohms terminating  ventricular fibrillation, restoring an AV paced rhythm.   At this point the device was implanted.  The pocket was copiously irrigated  with antibiotic-containing saline solution.  Hemostasis was assured and the  leads of in the pulse generator were placed in the pocket, secured to the  prepectoral fascia.  The wound was closed in three layers in the normal  fashion.  The wound was washed, dried and a Benzoin and Steri-Strip dressing  was applied. Needle counts, sponge counts, instrument counts were correct at  the end of the procedure according to the staff.  The patient tolerated the  procedure without apparent complications.           ______________________________  Duke Salvia, M.D.     SCK/MEDQ  D:  06/04/2006  T:  06/04/2006  Job:  [id number]   cc:   [facility]   Electrophysiologic Laboratory

## 2011-05-08 NOTE — Letter (Signed)
September 28, 2006     Marcina Millard, M.D.  [address]  [place], [place] [address]   RE:  HOUA, ACKERT  MRN:  [id number]  /  DOB:  1941/01/24   Dear Trinna Post:   Mr. Casher comes in.  He is status post device implantation.  He has  recurrent syncope, has significant conduction system disease.  We undertook  ICD implantation with attempted CRT placement but had universal  diaphragmatic stimulation.  He is frustrated because he is not able to  exercise to the degree that he likes on his stair stepper.  He is better  than he was but not where he wants to be.   His medications are reviewed, and he is on Coreg at 6.25 mg b.i.d.   On examination, his blood pressure is a little bit elevated at 141/93, pulse  is 81.  Lungs were clear.  Heart sounds were regular.  The extremities were  without edema.   Interrogation of his device demonstrated a P wave of 2.3 with impedance of  512, a threshold of 1 V at 0.2.  The R wave is 10.9 with an impedance of  440, threshold of 1 V at 0.2.  The outputs were reprogrammed to maximize  longevity, and the __________ was extended, the AV delay was extended, and  the high rate was extended to try and improve his exercise tolerance, as was  his slope.   I am not quite sure how this will work, as it is an accelerometer, as you  know.   The only other thing that I can think of is, Is there an impact on his LV  function with RV apical pacing?  Because of his conduction system disease,  he Wenckebachs even at a paced rate of 60.   If he continues to be symptomatic, I think we will probably set him up for a  cardiopulmonary stress test to help [place] identify whether it is worth [place]  taking a thoracoscopic LV lead positioning.  I will see him again in 3  months, Alex, and I will let you know what comes out of that.    Sincerely,     ______________________________  Duke Salvia, MD, [facility]    SCK/MedQ  /  Job #:  [id number]  DD:  09/28/2006 / DT:  09/29/2006

## 2011-05-08 NOTE — Letter (Signed)
January 13, 2007    Marcina Millard, M.D., Ph.D.,  F.A.C.C.  [facility]  [address]  [place], [place]  [address]   RE:  ELIZA, GRISSINGER  MRN:  [id number]  /  DOB:  12-Feb-1941   Dear Trinna Post:   Mr. Rothbauer comes in today, he continues to feel crummy.  This is  manifested both by lack of energy and just sitting around.  His wife  thinks he was starting to get depressed before he went back to work part  time in December.  In addition to peak exercise, he is having modest  impairments of exercise tolerance.   MEDICATIONS:  Plavix.  Lipitor.  Lisinopril.  Zetia.  Avodart.  Coreg,  which has been up-titrated.  This was initiated following ICD  implantation.   EXAMINATION:  His blood pressure is 112/70, pulse 90.  LUNGS:  Clear.  HEART:  Sounds were regular.  EXTREMITIES:  Without edema.   Interrogation of his Medtronic [phone] ICD demonstrates that he has a 6949  lead.  His P wave was 1.9, impedence was 544, threshold was 1V at 0.2 in  both chambers.  R wave was 10.2, impedence was 408.  His device was  reprogrammed for longevity as well as for the 6949 lead.   IMPRESSION:  1. 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  2. 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  3. Status post implantable cardioverter defibrillator for the above.  4. Further impairment of conduction capabilities, question related to      Coreg.  5. Deteriorating functional status, question related to #4.   Trinna Post, Mr. Fellman is still not thriving.  We went through a variety  of options and what we are going to do initially is stop his Coreg and  see whether the fatigue that he feels is drug related.  In the event  that it is, we can either try Toprol, I thought, or let him choose  between the side effects of the Coreg and its potential benefits.   In the event that this is not the issue, then consideration of a  treadmill would be useful to see what  happens to AV conduction at higher  heart rates, but this would be most informatively done in the absence of  a beta blocker.   Cardiopulmonary stress testing may still be of value down the road,  although it is still down the road, and the consideration of LV  thorascopic lead placement would be worth thinking about also.   Alex, I will let you know how he is feeling in a couple of weeks.    Sincerely,      Duke Salvia, MD, [facility]  Electronically Signed    SCK/MedQ  DD: 01/13/2007  DT: 01/13/2007  Job #: [id number]

## 2011-05-13 ENCOUNTER — Other Ambulatory Visit: Payer: Self-pay

## 2011-05-14 ENCOUNTER — Ambulatory Visit (INDEPENDENT_AMBULATORY_CARE_PROVIDER_SITE_OTHER): Payer: Medicare Other | Admitting: *Deleted

## 2011-05-14 DIAGNOSIS — I428 Other cardiomyopathies: Secondary | ICD-10-CM

## 2011-05-28 ENCOUNTER — Encounter: Payer: Self-pay | Admitting: *Deleted

## 2011-06-18 ENCOUNTER — Ambulatory Visit (INDEPENDENT_AMBULATORY_CARE_PROVIDER_SITE_OTHER): Payer: Medicare Other | Admitting: *Deleted

## 2011-06-18 ENCOUNTER — Other Ambulatory Visit: Payer: Self-pay

## 2011-06-18 ENCOUNTER — Telehealth: Payer: Self-pay | Admitting: Internal Medicine

## 2011-06-18 DIAGNOSIS — I441 Atrioventricular block, second degree: Secondary | ICD-10-CM

## 2011-06-18 DIAGNOSIS — I428 Other cardiomyopathies: Secondary | ICD-10-CM

## 2011-06-21 LAB — REMOTE ICD DEVICE
AL AMPLITUDE: 2.5 mv
AL IMPEDENCE ICD: 376 Ohm
ATRIAL PACING ICD: 100 pct
BATTERY VOLTAGE: 2.62 V
CHARGE TIME: 11.21 s
DEV-0020ICD: NEGATIVE
RV LEAD AMPLITUDE: 10 mv
RV LEAD IMPEDENCE ICD: 376 Ohm
TOT-0006: 20120523000000
TZAT-0001FASTVT: 1
TZAT-0001SLOWVT: 1
TZAT-0001SLOWVT: 2
TZAT-0004FASTVT: 8
TZAT-0004SLOWVT: 8
TZAT-0004SLOWVT: 8
TZAT-0005FASTVT: 88 pct
TZAT-0005SLOWVT: 84 pct
TZAT-0005SLOWVT: 91 pct
TZAT-0011FASTVT: 10 ms
TZAT-0011SLOWVT: 10 ms
TZAT-0011SLOWVT: 10 ms
TZAT-0012FASTVT: 200 ms
TZAT-0012SLOWVT: 200 ms
TZAT-0012SLOWVT: 200 ms
TZAT-0013FASTVT: 1
TZAT-0013SLOWVT: 2
TZAT-0013SLOWVT: 2
TZAT-0018FASTVT: NEGATIVE
TZAT-0018SLOWVT: NEGATIVE
TZAT-0018SLOWVT: NEGATIVE
TZAT-0019FASTVT: 8 V
TZAT-0019SLOWVT: 8 V
TZAT-0019SLOWVT: 8 V
TZAT-0020FASTVT: 1.6 ms
TZAT-0020SLOWVT: 1.6 ms
TZAT-0020SLOWVT: 1.6 ms
TZON-0003FASTVT: 240 ms
TZON-0003SLOWVT: 340 ms
TZON-0004SLOWVT: 16
TZON-0005SLOWVT: 12
TZON-0008FASTVT: 0 ms
TZON-0008SLOWVT: 0 ms
TZST-0001FASTVT: 2
TZST-0001FASTVT: 3
TZST-0001FASTVT: 4
TZST-0001FASTVT: 5
TZST-0001FASTVT: 6
TZST-0001SLOWVT: 3
TZST-0001SLOWVT: 4
TZST-0001SLOWVT: 5
TZST-0001SLOWVT: 6
TZST-0003FASTVT: 25 J
TZST-0003FASTVT: 35 J
TZST-0003FASTVT: 35 J
TZST-0003FASTVT: 35 J
TZST-0003FASTVT: 35 J
TZST-0003SLOWVT: 15 J
TZST-0003SLOWVT: 25 J
TZST-0003SLOWVT: 35 J
TZST-0003SLOWVT: 35 J
VENTRICULAR PACING ICD: 100 pct

## 2011-06-23 NOTE — Progress Notes (Signed)
icd remote check  

## 2011-06-25 NOTE — Progress Notes (Signed)
ICD remote 

## 2011-06-26 ENCOUNTER — Encounter: Payer: Medicare Other | Admitting: Internal Medicine

## 2011-06-29 ENCOUNTER — Encounter: Payer: Self-pay | Admitting: *Deleted

## 2011-07-09 ENCOUNTER — Encounter: Payer: Self-pay | Admitting: Internal Medicine

## 2011-07-10 ENCOUNTER — Encounter: Payer: Medicare Other | Admitting: Internal Medicine

## 2011-07-13 ENCOUNTER — Encounter: Payer: Self-pay | Admitting: *Deleted

## 2011-07-13 ENCOUNTER — Encounter: Payer: Self-pay | Admitting: Internal Medicine

## 2011-07-13 ENCOUNTER — Ambulatory Visit (INDEPENDENT_AMBULATORY_CARE_PROVIDER_SITE_OTHER): Payer: Medicare Other | Admitting: Internal Medicine

## 2011-07-13 DIAGNOSIS — I2589 Other forms of chronic ischemic heart disease: Secondary | ICD-10-CM

## 2011-07-13 DIAGNOSIS — I5022 Chronic systolic (congestive) heart failure: Secondary | ICD-10-CM

## 2011-07-13 DIAGNOSIS — I428 Other cardiomyopathies: Secondary | ICD-10-CM

## 2011-07-13 DIAGNOSIS — R0602 Shortness of breath: Secondary | ICD-10-CM

## 2011-07-13 DIAGNOSIS — Z95810 Presence of automatic (implantable) cardiac defibrillator: Secondary | ICD-10-CM

## 2011-07-13 DIAGNOSIS — T82198A Other mechanical complication of other cardiac electronic device, initial encounter: Secondary | ICD-10-CM | POA: Insufficient documentation

## 2011-07-13 DIAGNOSIS — I442 Atrioventricular block, complete: Secondary | ICD-10-CM

## 2011-07-13 NOTE — Assessment & Plan Note (Signed)
Given ongoing symptoms of functional limitation, we'll plan CRT upgrade at the time of his device generator replacement

## 2011-07-13 NOTE — Progress Notes (Signed)
HPI  [name] is a 70 y.o. 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 He is s/p meditronic DD-ICD implantation. He had a spell in the setting of a 6949-lead associated with transient loss of responsiveness while driving a car.His device was interrogated and no cause for his presyncopal episodes was identified. further investigation included a Holter monitor which demonstrated no loss of capture.    There has been no   chest pain. There has been no peripheral edema. but he had noted significant change in exercise tolerance. Previously he could do 35 minutes on an elliptical and now he can only do 5. Cross-country skiing-like motions however has not been impaired.  He underwent CPX demonstrating an normal heart excursion a VO2 max of 22.4.     Past Medical History  Diagnosis Date  . CAD (coronary artery disease)     stent to a 95%-occluded LAD in 1995  . Syncope     recurrent  . Cardiac arrest   . Ischemic cardiomyopathy     ejection fraction 35% to 45%  . Severe sinus bradycardia   . Hyperlipidemia   . TIA (transient ischemic attack) nov. 2011  . GERD (gastroesophageal reflux disease)   . Allergic rhinitis   . Dyspnea     PFT 12/19/10: FEV1 2.93(107%), FEV1% 72, TLC 8.02(138%), DLCO 110%, +BD    Past Surgical History  Procedure Date  . Implantation of medtronic dual-chamber cardioverter   . Tonsillectomy 1948  . Left knee replacement 2006    Current Outpatient Prescriptions  Medication Sig Dispense Refill  . aspirin 81 MG EC tablet Take 162 mg by mouth daily.        [name] atorvastatin (LIPITOR) 10 MG tablet Take 10 mg by mouth daily.        [name] azelastine (ASTELIN) 137 MCG/SPRAY nasal spray Place 1 spray into the nose at bedtime. Use in each nostril as directed       . beclomethasone (QVAR) 80 MCG/ACT inhaler Inhale 2 puffs into the lungs 2 (two) times  daily.        . calcium-vitamin D (OSCAL 500/200 D-3) 500-200 MG-UNIT per tablet Take 1 tablet by mouth daily.        . clopidogrel (PLAVIX) 75 MG tablet Take 75 mg by mouth daily.        [name] dutasteride (AVODART) 0.5 MG capsule Take 0.5 mg by mouth 3 (three) times a week.        . ezetimibe (ZETIA) 10 MG tablet Take 10 mg by mouth daily.        [name] lisinopril-hydrochlorothiazide (PRINZIDE,ZESTORETIC) 20-12.5 MG per tablet Take 1 tablet by mouth daily.        . Multiple Vitamin (MULTIVITAMIN) tablet Take 1 tablet by mouth daily.        . naproxen sodium (ANAPROX) 220 MG tablet Take 440 mg by mouth daily as needed.       . zafirlukast (ACCOLATE) 20 MG tablet Take 20 mg by mouth 2 (two) times daily.          Allergies  Allergen Reactions  . Sulfonamide Derivatives     Review of Systems negative except from HPI and PMH  Physical Exam Well developed and well nourished in no acute distress HENT normal E scleral and icterus clear Neck Supple JVP flat; carotids brisk and full Clear to ausculation Superficial veins are more prominent on  the left chest the right chest Regular rate and rhythm, no murmurs gallops or rub Soft with active bowel sounds No clubbing cyanosis and edema Alert and oriented, grossly normal motor and sensory function Skin Warm and Dry  Assessment and  Plan

## 2011-07-13 NOTE — Patient Instructions (Signed)
Your physician has recommended that you have a defibrillator generator change with venogram & possible Bi-V ICD upgrade. This is done in the hospital and usually requires an overnight stay. Please see the instruction sheet given to you today for more information.

## 2011-07-13 NOTE — Assessment & Plan Note (Addendum)
Given the fact that he is ventricularly paced 100% of the time with modest left ventricular dysfunction, I would attempt At the device revision to place a left ventricular lead again despite failure previously;    Review of the operative note describes the ability to get in to a bunch of veins that were very small or had a tortuous approach. It may be that the new Medtronic lead would allow [place] to deploy a lead

## 2011-07-13 NOTE — Assessment & Plan Note (Addendum)
Device has reached ERI. He'll need to undergo device generator replacement risks and benefits alternatives have been discussed including but not limited to infection lead fracture and death he understands and is agreeable to proceed  The issues were discussed at great length specifically the fracture rate of a 6949-lead and the need in the setting of both his relative youth but more importantly his device dependence at this bleed needs to be replaced.

## 2011-07-13 NOTE — Assessment & Plan Note (Signed)
We will need Not need to reassess perfusion he just underwent coronary artery catheterization demonstrated patent stents and otherwise nonobstructive disease

## 2011-07-13 NOTE — Assessment & Plan Note (Signed)
Patient has a 6949-lead and his device dependent. The lead will need to be bypassed at the time of generator replacement. We have discussed the potential role of lead extraction versus an additional lead. In the event that the vein is patent we will plan to add an RV defibrillator lead. Otherwise he'll be referred to Dr. Sharrell Ku for extraction and lead insertion.  Also, given his modest degree of heart failure and depressed left ventricular function and Device dependence Requiring RV pacing we plan to reattempt an LV lead insertion.

## 2011-07-13 NOTE — Assessment & Plan Note (Signed)
The patient has no intrinsic ventricular rhythm. As such with his 6949 beat in place at the time of revision This lead would need to be replaced either by implantation or extraction

## 2011-07-28 ENCOUNTER — Ambulatory Visit (INDEPENDENT_AMBULATORY_CARE_PROVIDER_SITE_OTHER): Payer: Medicare Other | Admitting: *Deleted

## 2011-07-28 DIAGNOSIS — I442 Atrioventricular block, complete: Secondary | ICD-10-CM

## 2011-07-28 DIAGNOSIS — I2589 Other forms of chronic ischemic heart disease: Secondary | ICD-10-CM

## 2011-07-29 ENCOUNTER — Ambulatory Visit (INDEPENDENT_AMBULATORY_CARE_PROVIDER_SITE_OTHER): Payer: Medicare Other | Admitting: *Deleted

## 2011-07-29 DIAGNOSIS — Z5181 Encounter for therapeutic drug level monitoring: Secondary | ICD-10-CM

## 2011-07-29 LAB — BASIC METABOLIC PANEL
BUN: 29 mg/dL — ABNORMAL HIGH (ref 6–23)
CO2: 27 mEq/L (ref 19–32)
Calcium: 8.6 mg/dL (ref 8.4–10.5)
Chloride: 102 mEq/L (ref 96–112)
Creat: 0.96 mg/dL (ref 0.50–1.35)
Glucose, Bld: 112 mg/dL — ABNORMAL HIGH (ref 70–99)
Potassium: 4.4 mEq/L (ref 3.5–5.3)
Sodium: 139 mEq/L (ref 135–145)

## 2011-07-29 LAB — CBC WITH DIFFERENTIAL/PLATELET
Basophils Absolute: 0 10*3/uL (ref 0.0–0.1)
Basophils Relative: 0 % (ref 0–1)
Eosinophils Absolute: 0.1 10*3/uL (ref 0.0–0.7)
Eosinophils Relative: 3 % (ref 0–5)
HCT: 42.8 % (ref 39.0–52.0)
Hemoglobin: 14.7 g/dL (ref 13.0–17.0)
Lymphocytes Relative: 32 % (ref 12–46)
Lymphs Abs: 1.6 10*3/uL (ref 0.7–4.0)
MCH: 30.8 pg (ref 26.0–34.0)
MCHC: 34.3 g/dL (ref 30.0–36.0)
MCV: 89.5 fL (ref 78.0–100.0)
Monocytes Absolute: 0.4 10*3/uL (ref 0.1–1.0)
Monocytes Relative: 9 % (ref 3–12)
Neutro Abs: 2.8 10*3/uL (ref 1.7–7.7)
Neutrophils Relative %: 57 % (ref 43–77)
Platelets: 187 10*3/uL (ref 150–400)
RBC: 4.78 MIL/uL (ref 4.22–5.81)
RDW: 12.5 % (ref 11.5–15.5)
WBC: 4.9 10*3/uL (ref 4.0–10.5)

## 2011-07-29 LAB — PROTIME-INR
INR: 0.93 (ref <1.50)
Prothrombin Time: 12.9 seconds (ref 11.6–15.2)

## 2011-08-03 ENCOUNTER — Ambulatory Visit (HOSPITAL_COMMUNITY): Payer: Medicare Other

## 2011-08-03 ENCOUNTER — Inpatient Hospital Stay (HOSPITAL_COMMUNITY)
Admission: RE | Admit: 2011-08-03 | Discharge: 2011-08-05 | DRG: 227 | Disposition: A | Discharge status: Home / Self Care | Payer: Medicare Other | Source: Ambulatory Visit | Attending: Internal Medicine | Admitting: Internal Medicine

## 2011-08-03 DIAGNOSIS — I442 Atrioventricular block, complete: Principal | ICD-10-CM | POA: Diagnosis present

## 2011-08-03 DIAGNOSIS — I5022 Chronic systolic (congestive) heart failure: Secondary | ICD-10-CM | POA: Diagnosis present

## 2011-08-03 DIAGNOSIS — Z96659 Presence of unspecified artificial knee joint: Secondary | ICD-10-CM

## 2011-08-03 DIAGNOSIS — J309 Allergic rhinitis, unspecified: Secondary | ICD-10-CM | POA: Diagnosis present

## 2011-08-03 DIAGNOSIS — I509 Heart failure, unspecified: Secondary | ICD-10-CM | POA: Diagnosis present

## 2011-08-03 DIAGNOSIS — Z8673 Personal history of transient ischemic attack (TIA), and cerebral infarction without residual deficits: Secondary | ICD-10-CM

## 2011-08-03 DIAGNOSIS — K219 Gastro-esophageal reflux disease without esophagitis: Secondary | ICD-10-CM | POA: Diagnosis present

## 2011-08-03 DIAGNOSIS — I2589 Other forms of chronic ischemic heart disease: Secondary | ICD-10-CM

## 2011-08-03 DIAGNOSIS — E785 Hyperlipidemia, unspecified: Secondary | ICD-10-CM | POA: Diagnosis present

## 2011-08-03 DIAGNOSIS — Z882 Allergy status to sulfonamides status: Secondary | ICD-10-CM

## 2011-08-03 DIAGNOSIS — Z7982 Long term (current) use of aspirin: Secondary | ICD-10-CM

## 2011-08-03 DIAGNOSIS — I2510 Atherosclerotic heart disease of native coronary artery without angina pectoris: Secondary | ICD-10-CM | POA: Diagnosis present

## 2011-08-03 DIAGNOSIS — Z95810 Presence of automatic (implantable) cardiac defibrillator: Secondary | ICD-10-CM

## 2011-08-03 DIAGNOSIS — Z9861 Coronary angioplasty status: Secondary | ICD-10-CM

## 2011-08-03 DIAGNOSIS — Z7902 Long term (current) use of antithrombotics/antiplatelets: Secondary | ICD-10-CM

## 2011-08-03 LAB — SURGICAL PCR SCREEN
MRSA, PCR: NEGATIVE
Staphylococcus aureus: POSITIVE — AB

## 2011-08-04 ENCOUNTER — Inpatient Hospital Stay (HOSPITAL_COMMUNITY): Payer: Medicare Other

## 2011-08-04 LAB — BASIC METABOLIC PANEL
Calcium: 9.6 mg/dL (ref 8.4–10.5)
Creatinine, Ser: 0.84 mg/dL (ref 0.50–1.35)
GFR calc Af Amer: >60 mL/min (ref >60)

## 2011-08-04 LAB — MAGNESIUM: Magnesium: 2.2 mg/dL (ref 1.5–2.5)

## 2011-08-06 ENCOUNTER — Encounter: Payer: Self-pay | Admitting: Internal Medicine

## 2011-08-06 ENCOUNTER — Ambulatory Visit (INDEPENDENT_AMBULATORY_CARE_PROVIDER_SITE_OTHER): Payer: Medicare Other | Admitting: *Deleted

## 2011-08-06 DIAGNOSIS — Z95810 Presence of automatic (implantable) cardiac defibrillator: Secondary | ICD-10-CM

## 2011-08-06 DIAGNOSIS — I2589 Other forms of chronic ischemic heart disease: Secondary | ICD-10-CM

## 2011-08-06 DIAGNOSIS — I442 Atrioventricular block, complete: Secondary | ICD-10-CM

## 2011-08-06 NOTE — Progress Notes (Signed)
ICD check in device clinic.

## 2011-08-17 NOTE — Op Note (Signed)
NAMESEYMOUR, PAVLAK            ACCOUNT NO.:  [id number]  MEDICAL RECORD NO.:  [id number]  LOCATION:  2920                         FACILITY:  [facility]  PHYSICIAN:  Duke Salvia, MD, FACCDATE OF BIRTH:  19-Sep-1941  DATE OF PROCEDURE:  08/03/2011 DATE OF DISCHARGE:                              OPERATIVE REPORT   PREOPERATIVE DIAGNOSES:  Complete heart block, previously implanted implantable cardioverter-defibrillator, congestive heart failure, ERI, 6949 lead.  POSTOPERATIVE DIAGNOSES:  Complete heart block, previously implanted implantable cardioverter-defibrillator, congestive heart failure, ERI.  PROCEDURES:  Contrast venogram, explantation of a previously-implanted device, insertion of a new device, insertion of a left ventricular lead, insertion of a new defibrillator lead.  Following obtaining informed consent, the patient was brought to the electrophysiology laboratory with the intention of undertaking the venogram to identify the patency of the extrathoracic left subclavian vein.  This was important as the patient had a 6949 lead in his relatively young age.  It was intended to replace the lead.  This was also necessary considering the patient is device dependent.  In the event that the vein were occluded, he would have gone for extraction. In fact the vein was opened and after routine prep and drape, then lidocaine was infiltrated along the line of the previous incision and carried down to layer of the prepectoral fascia using sharp dissection with electrocautery.  The device pocket was not opened.  At the prepectoral fascia, access was obtained of the left subclavian vein using a micropuncture kit and the previously undertaken venogram.  This allowed for access.  The vein was dilated.  Sequentially an 8-French and 9.5 French sheath were placed which were passed sequentially.  A [employer] 7121 65-cm active fixation ventricular lead serial number [id number] and a  Medtronic NB2 coronary sinus cannulation catheter.  The RV lead was manipulated to the RV apex where the bipolar paced sensed R-wave was 6.3 with a pace impedance of 502 with threshold 0.5 at 0.5.  Current threshold 0.5 mA.  There was no diaphragmatic pacing at 10 volts and the current of injury was modest.  The lead was secured to the prepectoral fascia.  Using the MB-2, a coronary sinus was cannulated relatively easily.  A contrast venogram demonstrated a lateral/posterolateral branch, a posterior branch and a suggestion of an anterolateral branch.  We thought to pursue the latter first and we placed a wire into this vein. However, I could not pass a lead or the dilator for the Attain II system as the bend in the body of the CS at its most lateral/anterolateral curvature.  We then abandoned this and went to the lateral/posterolateral vein and using the attained 2 we were able to cannulate it.  We then used a 4396 lead serial number [id number] and placed in system ramification which in the RAO projection was at the junction between the mid and distal third.  In this location the bipolar sensed L wave was 12 with a pace impedance of 1254, threshold of 2 volts at 0.5 milliseconds.  Current threshold is 0.5 mm.  No diaphragmatic pacing at 10 volts.  This lead was secured to the prepectoral fascia following removal of the Attain 2 and the  MB-2 sheath and confirmation of location by fluoroscopy.  The leads were secured to the prepectoral fascia.  At this point we went to remove the previously implanted ICD lead.  The device was explanted.  The previously implanted 6949 lead was capped.  The atrial lead was interrogated with an amplitude of 3.1 with a pace impedance of 421, a threshold of 0.8 at 0.5.  This was a Medtronic [id number] lead serial number [id number].  With the above acceptable parameters recorded, the leads were secured to Medtronic D 314TRG defibrillator serial number [id number].   There was an AV paced rhythm at 70.  The atrial impedance of 380 with a threshold of 0.75 at 0.4.  The RV impedance was 530 with threshold 0.5 at 0.4 and the LV impedance was 855 with a threshold of 1.75 at 0.4.  With these acceptable parameters recorded, defibrillation threshold testing was undertaken.  Ventricular fibrillation was induced via the T-wave shock after total duration of 8 seconds.  A 15 joules shock was delivered through measured resistance of 46 ohms terminating ventricular fibrillation and restoring it by the paced rhythm.  The device was implanted.  The pocket was copiously irrigated with antibiotic-containing saline solution. Hemostasis was assured.  Leads and the pulse generator were placed in the pocket and secured in prepectoral fascia.  The wound was closed in 3 layers in normal fashion.  The wound was washed, dried and Benzoin and Steri-Strip dressing was applied.  Needle counts, sponge counts and instrument counts were correct at the end of the procedure according to the staff.  The patient tolerated the procedure without apparent complication.     Duke Salvia, MD, [facility]     SCK/MEDQ  D:  08/03/2011  T:  08/03/2011  Job:  [id number]  Electronically Signed by Sherryl Manges MD [facility] on 08/17/2011 01:53:19 PM

## 2011-08-17 NOTE — Discharge Summary (Signed)
[name], [name]            ACCOUNT NO.:  [id number]  MEDICAL RECORD NO.:  [id number]  LOCATION:  2019                         FACILITY:  [facility]  PHYSICIAN:  Duke Salvia, MD, FACCDATE OF BIRTH:  Aug 11, 1941  DATE OF ADMISSION:  08/03/2011 DATE OF DISCHARGE:  08/05/2011                              DISCHARGE SUMMARY   PRIMARY CARDIOLOGIST:  Duke Salvia, MD, [facility]  DISCHARGE DIAGNOSES: 1. Ischemic cardiomyopathy, status post biventricular implantable     cardioverter-defibrillator upgrade. 2. Coronary artery disease, status post left anterior descending     artery stenting in 1995. 3. History of recurrent syncope. 4. History of severe sinus bradycardia. 5. Hyperlipidemia. 6. History of transient ischemic attack. 7. Gastroesophageal reflux disease. 8. Allergic rhinitis. 9. Status post tonsillectomy. 10.Status post left total knee arthroplasty.  ALLERGIES:  SULFA.  PROCEDURES:  Biventricular ICD upgrade with explantation of previously implanted device, insertion of a new device, insertion of the left ventricular lead as well as insertion of a new defibrillator lead.  The new device is Medtronic Protecta XT CRT-D serial number [id number].  HISTORY OF PRESENT ILLNESS:  A 70 year old male with prior history of ischemic cardiomyopathy, he is status post prior Medtronic dual-chamber ICD with a 6949 leads.  The patient has been experiencing significant decreased in exercise tolerance, a recent normal CPX test.  Seen by Dr. Graciela Husbands in clinic on July 13, 2011, and his device was found to have reached elective replacement indicators.  It was determined that in replacing of device, it would be upgrade to a biventricular device given the patient's CHF and also that the 6949 lead would be replaced.  HOSPITAL COURSE:  The patient presented to the EP Lab on August 03, 2011, and underwent successful biventricular ICD upgrade with placement of a new LV disease as well as a new RV  defibrillator lead.  The patient tolerated the procedure well and postprocedure has been ambulating without recurrent symptoms or limitations.  He will be discharged home today in good condition with plan for follow up wound evaluation and basic metabolic panel approximately 2 weeks.  DISCHARGE LABS:  Sodium 135, potassium 3.7, chloride 100, CO2 30, BUN 23, creatinine 0.84, glucose 126, calcium 9.6, magnesium 2.2.  MRSA screen was negative.  DISPOSITION:  The patient will be discharged home today in good condition.  FOLLOWUP PLANS AND APPOINTMENTS:  The patient is to follow up with [facility] on August 20, 2011 at 10:15 a.m., at which point, we will also obtain a basic metabolic panel.  The patient will follow up with Dr. Sherryl Manges on September 29, 2011 at 9:45 a.m.  We will obtain the basic metabolic panel that day also.  DISCHARGE MEDICATIONS: 1. Aspirin 81 mg daily. 2. Ambien 5 mg nightly p.r.n. 3. Avodart 0.5 mg every third day. 4. Allergy immunotherapy regimen one tablet daily. 5. Astepro nasal spray 0.15% one spray nasally daily. 6. Dexilant 60 mg daily. 7. Lipitor 10 mg daily. 8. Lisinopril/hydrochlorothiazide 20/12.5 mg daily. 9. Multivitamin daily. 10.Naproxen 220 mg two tablets daily. 11.Os-Cal plus D one tablet daily. 12.Plavix 75 mg daily. 13.QVAR 80 mcg two puffs daily. 14.Zafirlukast 20 mg b.i.d. 15.Zetia 10 mg daily.  OUTSTANDING  LABORATORY STUDIES:  Follow up BMET in 2 weeks.  DURATION OF DISCHARGE ENCOUNTER:  Forty five minutes including physician time.     Nicolasa Ducking, ANP   ______________________________ Duke Salvia, MD, [facility]    CB/MEDQ  D:  08/05/2011  T:  08/05/2011  Job:  [id number]  Electronically Signed by Nicolasa Ducking ANP on 08/13/2011 03:00:37 PM Electronically Signed by Sherryl Manges MD [facility] on 08/17/2011 01:53:23 PM

## 2011-08-20 ENCOUNTER — Ambulatory Visit (INDEPENDENT_AMBULATORY_CARE_PROVIDER_SITE_OTHER): Payer: Medicare Other | Admitting: *Deleted

## 2011-08-20 DIAGNOSIS — I2589 Other forms of chronic ischemic heart disease: Secondary | ICD-10-CM

## 2011-08-20 DIAGNOSIS — I428 Other cardiomyopathies: Secondary | ICD-10-CM

## 2011-08-20 DIAGNOSIS — I442 Atrioventricular block, complete: Secondary | ICD-10-CM

## 2011-08-20 LAB — ICD DEVICE OBSERVATION
AL AMPLITUDE: 1.625 mv
ATRIAL PACING ICD: 99.78 pct
BAMS-0001: 170 {beats}/min
CHARGE TIME: 2.712 s
FVT: 0
LV LEAD IMPEDENCE ICD: 760 Ohm
RV LEAD IMPEDENCE ICD: 380 Ohm
TOT-0001: 1
TOT-0006: 20120813000000
TZAT-0001ATACH: 3
TZAT-0001FASTVT: 1
TZAT-0002ATACH: NEGATIVE
TZAT-0011SLOWVT: 10 ms
TZAT-0011SLOWVT: 10 ms
TZAT-0012ATACH: 150 ms
TZAT-0012ATACH: 150 ms
TZAT-0012FASTVT: 200 ms
TZAT-0012SLOWVT: 200 ms
TZAT-0012SLOWVT: 200 ms
TZAT-0013SLOWVT: 2
TZAT-0013SLOWVT: 2
TZAT-0018SLOWVT: NEGATIVE
TZAT-0019ATACH: 6 V
TZAT-0019SLOWVT: 8 V
TZAT-0019SLOWVT: 8 V
TZAT-0020ATACH: 1.5 ms
TZAT-0020ATACH: 1.5 ms
TZAT-0020ATACH: 1.5 ms
TZAT-0020SLOWVT: 1.5 ms
TZAT-0020SLOWVT: 1.5 ms
TZON-0003ATACH: 350 ms
TZON-0003SLOWVT: 340 ms
TZON-0003VSLOWVT: 400 ms
TZON-0004SLOWVT: 16
TZON-0005SLOWVT: 12
TZST-0001ATACH: 4
TZST-0001ATACH: 5
TZST-0001ATACH: 6
TZST-0001FASTVT: 2
TZST-0001FASTVT: 3
TZST-0001FASTVT: 4
TZST-0001SLOWVT: 4
TZST-0001SLOWVT: 5
TZST-0002ATACH: NEGATIVE
TZST-0002ATACH: NEGATIVE
TZST-0002FASTVT: NEGATIVE
TZST-0002FASTVT: NEGATIVE
TZST-0003SLOWVT: 25 J
TZST-0003SLOWVT: 35 J

## 2011-08-20 LAB — BASIC METABOLIC PANEL
BUN: 30 mg/dL — ABNORMAL HIGH (ref 6–23)
Calcium: 9.2 mg/dL (ref 8.4–10.5)
Creatinine, Ser: 1 mg/dL (ref 0.4–1.5)
GFR: 77.71 mL/min (ref >60.00)
Glucose, Bld: 132 mg/dL — ABNORMAL HIGH (ref 70–99)

## 2011-08-20 NOTE — Progress Notes (Signed)
Addended by: Keitha Butte on: 08/20/2011 10:49 AM   Modules accepted: Orders

## 2011-08-20 NOTE — Progress Notes (Signed)
Addended by: Keitha Butte on: 08/20/2011 10:53 AM   Modules accepted: Orders

## 2011-08-21 ENCOUNTER — Telehealth: Payer: Self-pay | Admitting: *Deleted

## 2011-08-21 MED ORDER — LISINOPRIL 10 MG PO TABS: 10.0000 mg | ORAL_TABLET | Freq: Every day | ORAL | Status: DC

## 2011-08-21 NOTE — Telephone Encounter (Signed)
Message copied by Annia Belt on Fri Aug 21, 2011  4:50 PM ------      Message from: Duke Salvia      Created: Fri Aug 21, 2011 11:31 AM       Please Inform Patient that bun/cr are elevated  We will want to decrease his diuretics by changing lisinopril/hct to lisinopril 10 from 20/12.5   Please make sure he is not on lasix      Thanks

## 2011-08-21 NOTE — Telephone Encounter (Signed)
Called and left msg with pt with info below. I have sent new Rx to pharmacy and asked that pt call back with any questions.

## 2011-08-26 ENCOUNTER — Telehealth: Payer: Self-pay | Admitting: Internal Medicine

## 2011-08-26 NOTE — Telephone Encounter (Signed)
I spoke with Dr. Graciela Husbands. Patient should wait at least 6 weeks post procedure before swinging golf clubs. I have called the patient and made him aware of this. He will be 6 weeks out around the week of 9/24. He verbalizes understanding.

## 2011-08-26 NOTE — Telephone Encounter (Signed)
Pt calling regarding pacemaker replacement. Pt wants to know approximate date that pt can do ordinary things like swing baseball bats and golf clubs. Please return pt call to discuss further.

## 2011-08-31 ENCOUNTER — Encounter: Payer: Medicare Other | Admitting: Otolaryngology

## 2011-09-21 ENCOUNTER — Encounter: Payer: Medicare Other | Admitting: Otolaryngology

## 2011-09-29 ENCOUNTER — Other Ambulatory Visit: Payer: Medicare Other | Admitting: *Deleted

## 2011-09-29 ENCOUNTER — Encounter: Payer: Medicare Other | Admitting: Internal Medicine

## 2011-09-30 ENCOUNTER — Encounter: Payer: Self-pay | Admitting: Internal Medicine

## 2011-10-01 ENCOUNTER — Ambulatory Visit (INDEPENDENT_AMBULATORY_CARE_PROVIDER_SITE_OTHER): Payer: Medicare Other | Admitting: Internal Medicine

## 2011-10-01 ENCOUNTER — Encounter: Payer: Self-pay | Admitting: Internal Medicine

## 2011-10-01 ENCOUNTER — Ambulatory Visit (INDEPENDENT_AMBULATORY_CARE_PROVIDER_SITE_OTHER): Payer: Medicare Other | Admitting: *Deleted

## 2011-10-01 DIAGNOSIS — I2510 Atherosclerotic heart disease of native coronary artery without angina pectoris: Secondary | ICD-10-CM

## 2011-10-01 DIAGNOSIS — I442 Atrioventricular block, complete: Secondary | ICD-10-CM

## 2011-10-01 DIAGNOSIS — I5022 Chronic systolic (congestive) heart failure: Secondary | ICD-10-CM

## 2011-10-01 DIAGNOSIS — R0989 Other specified symptoms and signs involving the circulatory and respiratory systems: Secondary | ICD-10-CM

## 2011-10-01 DIAGNOSIS — I10 Essential (primary) hypertension: Secondary | ICD-10-CM

## 2011-10-01 DIAGNOSIS — I459 Conduction disorder, unspecified: Secondary | ICD-10-CM | POA: Insufficient documentation

## 2011-10-01 DIAGNOSIS — I2589 Other forms of chronic ischemic heart disease: Secondary | ICD-10-CM

## 2011-10-01 DIAGNOSIS — I495 Sick sinus syndrome: Secondary | ICD-10-CM

## 2011-10-01 DIAGNOSIS — Z95810 Presence of automatic (implantable) cardiac defibrillator: Secondary | ICD-10-CM

## 2011-10-01 DIAGNOSIS — R001 Bradycardia, unspecified: Secondary | ICD-10-CM

## 2011-10-01 DIAGNOSIS — Z79899 Other long term (current) drug therapy: Secondary | ICD-10-CM

## 2011-10-01 LAB — ICD DEVICE OBSERVATION
AL AMPLITUDE: 1 mv
AL IMPEDENCE ICD: 380 Ohm
ATRIAL PACING ICD: 99.03 pct
BAMS-0001: 170 {beats}/min
CHARGE TIME: 2.712 s
FVT: 0
LV LEAD THRESHOLD: 1.5 V
PACEART VT: 0
RV LEAD IMPEDENCE ICD: 437 Ohm
TOT-0001: 1
TOT-0002: 0
TZAT-0001ATACH: 1
TZAT-0001FASTVT: 1
TZAT-0002ATACH: NEGATIVE
TZAT-0011SLOWVT: 10 ms
TZAT-0011SLOWVT: 10 ms
TZAT-0012ATACH: 150 ms
TZAT-0012SLOWVT: 200 ms
TZAT-0012SLOWVT: 200 ms
TZAT-0013SLOWVT: 2
TZAT-0018ATACH: NEGATIVE
TZAT-0018SLOWVT: NEGATIVE
TZAT-0018SLOWVT: NEGATIVE
TZAT-0019ATACH: 6 V
TZAT-0019SLOWVT: 8 V
TZAT-0019SLOWVT: 8 V
TZAT-0020ATACH: 1.5 ms
TZAT-0020ATACH: 1.5 ms
TZAT-0020FASTVT: 1.5 ms
TZON-0003ATACH: 350 ms
TZON-0003SLOWVT: 340 ms
TZON-0004SLOWVT: 16
TZON-0005SLOWVT: 12
TZST-0001ATACH: 4
TZST-0001ATACH: 5
TZST-0001FASTVT: 2
TZST-0001FASTVT: 3
TZST-0001SLOWVT: 4
TZST-0002ATACH: NEGATIVE
TZST-0002FASTVT: NEGATIVE
TZST-0003SLOWVT: 15 J
TZST-0003SLOWVT: 35 J

## 2011-10-01 MED ORDER — BISOPROLOL FUMARATE 5 MG PO TABS: ORAL_TABLET | ORAL | Status: DC

## 2011-10-01 NOTE — Assessment & Plan Note (Addendum)
Stable on current medications. We will try at back a low-dose beta blocker. He will start on bisoprolol. We'll try him on one fourth of a 5 mg tablet

## 2011-10-01 NOTE — Assessment & Plan Note (Signed)
Hopefully will improve with added beta blocker

## 2011-10-01 NOTE — Assessment & Plan Note (Signed)
Stable post pacing 

## 2011-10-01 NOTE — Patient Instructions (Addendum)
The patient is to take bisoprolol 2.5 mg as directed. Follow up with Dr. Graciela Husbands in 6 months.

## 2011-10-01 NOTE — Assessment & Plan Note (Signed)
Now class II following CRT upgrade.

## 2011-10-01 NOTE — Assessment & Plan Note (Signed)
Heart rate excursion adequate

## 2011-10-01 NOTE — Assessment & Plan Note (Signed)
The patient's device was interrogated and the information was fully reviewed.  The device was reprogrammed to maximize longevity on the new leads.

## 2011-10-01 NOTE — Progress Notes (Signed)
HPI  [name] is a 70 y.o. male Seen in followup 4 ICD implantation initially for syncope in the setting of depressed left ventricular function. He had a 6949-lead. He reached ERI summer 2012 and underwent device explantation new defibrillator lead insertion with left ventricular lead placement and generator replacement.   He had been having problems with shortness of breath. He was 100% ventricularly paced. He had undergone CPX testing with a VO2 max of 22.4.  His history of ischemic cardiomyopathy with prior stenting of an LAD.  Catheterization 2012 demonstrated a patent stent and nonobstructive coronary disease.  At that the time ejection fraction was 40%;  He remains on Plavix following stenting in [place] 5 or 6 years ago He is much improved since CRT implantation.   Reviewed why he is not on a beta blocker. He has been intolerant to multiple drugs. This is mostly related to fatigue    Past Medical History  Diagnosis Date  . CAD (coronary artery disease)     stent to a 95%-occluded LAD in 1995  . Syncope     recurrent  . Cardiac arrest   . Ischemic cardiomyopathy     ejection fraction 35% to 45%  . Severe sinus bradycardia   . Hyperlipidemia   . TIA (transient ischemic attack) nov. 2011  . GERD (gastroesophageal reflux disease)   . Allergic rhinitis   . Dyspnea     PFT 12/19/10: FEV1 2.93(107%), FEV1% 72, TLC 8.02(138%), DLCO 110%, +BD    Past Surgical History  Procedure Date  . Implantation of medtronic dual-chamber cardioverter   . Tonsillectomy 1948  . Left knee replacement 2006    Current Outpatient Prescriptions  Medication Sig Dispense Refill  . aspirin 81 MG EC tablet Take 162 mg by mouth daily.        [name] atorvastatin (LIPITOR) 10 MG tablet Take 10 mg by mouth daily.        [name] azelastine (ASTELIN) 137 MCG/SPRAY nasal spray Place 1 spray into the nose at bedtime. Use in each nostril as directed       . beclomethasone (QVAR) 80 MCG/ACT inhaler Inhale 2  puffs into the lungs 2 (two) times daily.        . calcium-vitamin D (OSCAL 500/200 D-3) 500-200 MG-UNIT per tablet Take 1 tablet by mouth daily.        . clopidogrel (PLAVIX) 75 MG tablet Take 75 mg by mouth daily.        [name] dutasteride (AVODART) 0.5 MG capsule Take 0.5 mg by mouth 3 (three) times a week.        . ezetimibe (ZETIA) 10 MG tablet Take 10 mg by mouth daily.        [name] lisinopril-hydrochlorothiazide (PRINZIDE,ZESTORETIC) 20-12.5 MG per tablet Take 1 tablet by mouth daily.        . Multiple Vitamin (MULTIVITAMIN) tablet Take 1 tablet by mouth daily.        . naproxen sodium (ANAPROX) 220 MG tablet Take 440 mg by mouth daily as needed.       . zafirlukast (ACCOLATE) 20 MG tablet Take 20 mg by mouth 2 (two) times daily.          Allergies  Allergen Reactions  . Sulfonamide Derivatives     Review of Systems negative except from HPI and PMH  Physical Exam Well developed and well nourished in no acute distress HENT normal E scleral and icterus clear Neck Supple JVP flat; carotids brisk and full Clear to  ausculation Superficial veins are more prominent on the left chest the right chest Device pocket well healed Regular rate and rhythm, no murmurs gallops or rub Soft with active bowel sounds No clubbing cyanosis and edema Alert and oriented, grossly normal motor and sensory function Skin Warm and Dry  Assessment and  Plan

## 2011-10-01 NOTE — Assessment & Plan Note (Signed)
Replaced a generator replacement in August 2012

## 2011-10-02 LAB — BASIC METABOLIC PANEL
Calcium: 9.3 mg/dL (ref 8.6–10.2)
Chloride: 106 mmol/L (ref 97–108)
GFR calc non Af Amer: 88 mL/min/{1.73_m2} (ref >59)
Glucose: 114 mg/dL — ABNORMAL HIGH (ref 65–99)
Potassium: 4.2 mmol/L (ref 3.5–5.2)
Sodium: 141 mmol/L (ref 134–144)

## 2011-10-22 ENCOUNTER — Encounter: Payer: Medicare Other | Admitting: Otolaryngology

## 2011-11-21 ENCOUNTER — Encounter: Payer: Medicare Other | Admitting: Otolaryngology

## 2011-12-18 ENCOUNTER — Encounter: Payer: Self-pay | Admitting: Internal Medicine

## 2011-12-31 ENCOUNTER — Encounter: Payer: Medicare Other | Admitting: *Deleted

## 2012-01-04 ENCOUNTER — Encounter: Payer: Self-pay | Admitting: *Deleted

## 2012-01-11 ENCOUNTER — Ambulatory Visit (INDEPENDENT_AMBULATORY_CARE_PROVIDER_SITE_OTHER): Payer: Medicare Other | Admitting: *Deleted

## 2012-01-11 ENCOUNTER — Encounter: Payer: Self-pay | Admitting: Internal Medicine

## 2012-01-11 DIAGNOSIS — I5022 Chronic systolic (congestive) heart failure: Secondary | ICD-10-CM

## 2012-01-11 DIAGNOSIS — I442 Atrioventricular block, complete: Secondary | ICD-10-CM

## 2012-01-11 DIAGNOSIS — Z95810 Presence of automatic (implantable) cardiac defibrillator: Secondary | ICD-10-CM

## 2012-01-11 DIAGNOSIS — I2589 Other forms of chronic ischemic heart disease: Secondary | ICD-10-CM

## 2012-01-17 LAB — REMOTE ICD DEVICE
ATRIAL PACING ICD: 77.93 pct
BAMS-0001: 170 {beats}/min
CHARGE TIME: 2.712 s
FVT: 0
LV LEAD THRESHOLD: 1.875 V
PACEART VT: 0
RV LEAD AMPLITUDE: 6.6 mv
TOT-0001: 1
TZAT-0001ATACH: 1
TZAT-0001ATACH: 2
TZAT-0001FASTVT: 1
TZAT-0004SLOWVT: 8
TZAT-0004SLOWVT: 8
TZAT-0005SLOWVT: 84 pct
TZAT-0005SLOWVT: 91 pct
TZAT-0011SLOWVT: 10 ms
TZAT-0012ATACH: 150 ms
TZAT-0012ATACH: 150 ms
TZAT-0012FASTVT: 200 ms
TZAT-0012SLOWVT: 200 ms
TZAT-0012SLOWVT: 200 ms
TZAT-0013SLOWVT: 2
TZAT-0013SLOWVT: 2
TZAT-0018ATACH: NEGATIVE
TZAT-0018ATACH: NEGATIVE
TZAT-0018FASTVT: NEGATIVE
TZAT-0019ATACH: 6 V
TZAT-0019FASTVT: 8 V
TZAT-0020ATACH: 1.5 ms
TZAT-0020ATACH: 1.5 ms
TZAT-0020FASTVT: 1.5 ms
TZAT-0020SLOWVT: 1.5 ms
TZAT-0020SLOWVT: 1.5 ms
TZON-0003ATACH: 350 ms
TZON-0003SLOWVT: 340 ms
TZON-0003VSLOWVT: 400 ms
TZON-0004SLOWVT: 16
TZON-0004VSLOWVT: 20
TZST-0001ATACH: 4
TZST-0001ATACH: 6
TZST-0001FASTVT: 2
TZST-0001FASTVT: 4
TZST-0001FASTVT: 6
TZST-0001SLOWVT: 5
TZST-0001SLOWVT: 6
TZST-0002ATACH: NEGATIVE
TZST-0002FASTVT: NEGATIVE
TZST-0002FASTVT: NEGATIVE
TZST-0002FASTVT: NEGATIVE
TZST-0003SLOWVT: 15 J
TZST-0003SLOWVT: 25 J
TZST-0003SLOWVT: 35 J
VF: 0

## 2012-01-18 NOTE — Progress Notes (Signed)
Remote icd check w/icm  

## 2012-01-19 ENCOUNTER — Encounter: Payer: Self-pay | Admitting: *Deleted

## 2012-04-14 ENCOUNTER — Encounter: Payer: Medicare Other | Admitting: *Deleted

## 2012-04-21 ENCOUNTER — Encounter: Payer: Self-pay | Admitting: *Deleted

## 2012-04-27 ENCOUNTER — Encounter: Payer: Self-pay | Admitting: Internal Medicine

## 2012-04-27 ENCOUNTER — Ambulatory Visit (INDEPENDENT_AMBULATORY_CARE_PROVIDER_SITE_OTHER): Payer: Medicare Other | Admitting: *Deleted

## 2012-04-27 DIAGNOSIS — I442 Atrioventricular block, complete: Secondary | ICD-10-CM

## 2012-04-27 DIAGNOSIS — I2589 Other forms of chronic ischemic heart disease: Secondary | ICD-10-CM

## 2012-04-27 DIAGNOSIS — I5022 Chronic systolic (congestive) heart failure: Secondary | ICD-10-CM

## 2012-04-28 LAB — REMOTE ICD DEVICE
AL IMPEDENCE ICD: 380 Ohm
BAMS-0001: 170 {beats}/min
BATTERY VOLTAGE: 3.1645 V
BRDY-0003RV: 140 {beats}/min
PACEART VT: 0
RV LEAD AMPLITUDE: 6.5 mv
RV LEAD THRESHOLD: 0.75 V
TOT-0002: 0
TZAT-0001ATACH: 1
TZAT-0001ATACH: 3
TZAT-0001FASTVT: 1
TZAT-0002ATACH: NEGATIVE
TZAT-0004SLOWVT: 8
TZAT-0004SLOWVT: 8
TZAT-0005SLOWVT: 84 pct
TZAT-0005SLOWVT: 91 pct
TZAT-0012ATACH: 150 ms
TZAT-0012ATACH: 150 ms
TZAT-0012SLOWVT: 200 ms
TZAT-0012SLOWVT: 200 ms
TZAT-0013SLOWVT: 2
TZAT-0013SLOWVT: 2
TZAT-0018ATACH: NEGATIVE
TZAT-0018FASTVT: NEGATIVE
TZAT-0019ATACH: 6 V
TZAT-0019ATACH: 6 V
TZAT-0019FASTVT: 8 V
TZAT-0020ATACH: 1.5 ms
TZAT-0020ATACH: 1.5 ms
TZON-0003ATACH: 350 ms
TZON-0003SLOWVT: 340 ms
TZON-0004SLOWVT: 16
TZON-0004VSLOWVT: 20
TZST-0001ATACH: 5
TZST-0001ATACH: 6
TZST-0001FASTVT: 2
TZST-0001FASTVT: 6
TZST-0001SLOWVT: 3
TZST-0001SLOWVT: 6
TZST-0002ATACH: NEGATIVE
TZST-0002ATACH: NEGATIVE
TZST-0002FASTVT: NEGATIVE
TZST-0002FASTVT: NEGATIVE
TZST-0003SLOWVT: 25 J
VF: 0

## 2012-05-09 ENCOUNTER — Encounter: Payer: Self-pay | Admitting: *Deleted

## 2012-05-09 NOTE — Progress Notes (Signed)
ICD remote with ICM 

## 2012-08-25 ENCOUNTER — Ambulatory Visit (INDEPENDENT_AMBULATORY_CARE_PROVIDER_SITE_OTHER): Payer: Medicare Other | Admitting: Internal Medicine

## 2012-08-25 ENCOUNTER — Encounter: Payer: Self-pay | Admitting: Internal Medicine

## 2012-08-25 VITALS — BP 126/88 | HR 68 | Wt 189.5 lb

## 2012-08-25 DIAGNOSIS — I4891 Unspecified atrial fibrillation: Secondary | ICD-10-CM

## 2012-08-25 DIAGNOSIS — I495 Sick sinus syndrome: Secondary | ICD-10-CM

## 2012-08-25 DIAGNOSIS — Z95810 Presence of automatic (implantable) cardiac defibrillator: Secondary | ICD-10-CM

## 2012-08-25 DIAGNOSIS — I5022 Chronic systolic (congestive) heart failure: Secondary | ICD-10-CM

## 2012-08-25 DIAGNOSIS — I2589 Other forms of chronic ischemic heart disease: Secondary | ICD-10-CM

## 2012-08-25 DIAGNOSIS — R001 Bradycardia, unspecified: Secondary | ICD-10-CM

## 2012-08-25 DIAGNOSIS — E785 Hyperlipidemia, unspecified: Secondary | ICD-10-CM | POA: Insufficient documentation

## 2012-08-25 DIAGNOSIS — I10 Essential (primary) hypertension: Secondary | ICD-10-CM

## 2012-08-25 LAB — ICD DEVICE OBSERVATION
AL IMPEDENCE ICD: 342 Ohm
CHARGE TIME: 8.598 s
LV LEAD IMPEDENCE ICD: 817 Ohm
PACEART VT: 0
RV LEAD AMPLITUDE: 5.875 mv
RV LEAD IMPEDENCE ICD: 399 Ohm
TOT-0001: 1
TOT-0002: 0
TOT-0006: 20120813000000
TZAT-0001ATACH: 1
TZAT-0001ATACH: 2
TZAT-0001ATACH: 3
TZAT-0001FASTVT: 1
TZAT-0001SLOWVT: 1
TZAT-0001SLOWVT: 2
TZAT-0002ATACH: NEGATIVE
TZAT-0002FASTVT: NEGATIVE
TZAT-0012ATACH: 150 ms
TZAT-0013SLOWVT: 2
TZAT-0013SLOWVT: 2
TZAT-0018ATACH: NEGATIVE
TZAT-0018ATACH: NEGATIVE
TZAT-0018FASTVT: NEGATIVE
TZAT-0018SLOWVT: NEGATIVE
TZAT-0018SLOWVT: NEGATIVE
TZAT-0019ATACH: 6 V
TZAT-0020ATACH: 1.5 ms
TZAT-0020SLOWVT: 1.5 ms
TZAT-0020SLOWVT: 1.5 ms
TZON-0004SLOWVT: 16
TZON-0004VSLOWVT: 20
TZON-0005SLOWVT: 12
TZST-0001ATACH: 4
TZST-0001ATACH: 5
TZST-0001ATACH: 6
TZST-0001FASTVT: 3
TZST-0001FASTVT: 4
TZST-0001SLOWVT: 3
TZST-0001SLOWVT: 5
TZST-0001SLOWVT: 6
TZST-0002ATACH: NEGATIVE
TZST-0002ATACH: NEGATIVE
TZST-0002ATACH: NEGATIVE
TZST-0002FASTVT: NEGATIVE
TZST-0002FASTVT: NEGATIVE
TZST-0003SLOWVT: 25 J
TZST-0003SLOWVT: 35 J

## 2012-08-25 NOTE — Assessment & Plan Note (Signed)
The patient has ischemic cardiomyopathy. He underwent stenting   6-7 years ago. This was not in the context of acute MI. We will plan to stop his Plavix. He will continue on aspirin.

## 2012-08-25 NOTE — Assessment & Plan Note (Signed)
I had a lengthy discussion regarding my personal bias against ZETIA and the lack of data regarding its benefits. I suggested that he discuss with Dr. Randa Lynn possibility of taking higher dose atorvastatin and discontinuings  The zetia given the lack of data

## 2012-08-25 NOTE — Assessment & Plan Note (Signed)
The patient's device was interrogated.  The information was reviewed. No changes were made in the programming.    

## 2012-08-25 NOTE — Progress Notes (Signed)
Patient Care Team: [name], MD as PCP - General (Unknown Physician Specialty) [name], MD (Unknown Physician Specialty)   HPI  [name] is a 71 y.o. male Seen in followup 4 ICD implantation initially for syncope in the setting of depressed left ventricular function. He had a 6949-lead. He reached ERI summer 2012 and underwent device explantation new defibrillator lead insertion with left ventricular lead placement and generator replacement.  He had been having problems with shortness of breath. He was 100% ventricularly paced. He had undergone CPX testing with a VO2 max of 22.4.   His history of ischemic cardiomyopathy with prior stenting of an LAD. Catheterization 2012 demonstrated a patent stent and nonobstructive coronary disease. At that the time ejection fraction was 40%; He remains on Plavix following stenting in [place] 5 or 6 years ago   The patient denies chest pain, shortness of breath, nocturnal dyspnea, orthopnea or peripheral edema.  There have been no palpitations, lightheadedness or syncope. Exercise tolerance remains quite good although not great      Past Medical History  Diagnosis Date  . ischemic cardiomyopathy     stent to a 95%-occluded LAD in 1995; catheterization in 2012 demonstrated patent grafts and nonobstructive disease  . Syncope     recurrent  . Cardiac arrest   . Biventricular defibrillator-Medtronic     Generator replacement 2012 with CRT upgrade  . Severe sinus bradycardia   . High-grade heart block   . TIA (transient ischemic attack) nov. 2011  . GERD (gastroesophageal reflux disease)   . Allergic rhinitis   . Dyspnea     PFT 12/19/10: FEV1 2.93(107%), FEV1% 72, TLC 8.02(138%), DLCO 110%, +BD    Past Surgical History  Procedure Date  . Implantation of medtronic dual-chamber cardioverter   . Tonsillectomy 1948  . Left knee replacement 2006    Current Outpatient Prescriptions  Medication Sig Dispense Refill  . aspirin  81 MG EC tablet Take 81 mg by mouth daily.       . ASTEPRO 0.15 % SOLN as directed.      [name] atorvastatin (LIPITOR) 10 MG tablet Take 10 mg by mouth daily.        . bisoprolol (ZEBETA) 5 MG tablet Take 1/2 tablet as directed.  30 tablet  6  . calcium-vitamin D (OSCAL 500/200 D-3) 500-200 MG-UNIT per tablet Take 1 tablet by mouth daily.        . clopidogrel (PLAVIX) 75 MG tablet Take 75 mg by mouth daily.        [name] dutasteride (AVODART) 0.5 MG capsule Take 0.5 mg by mouth 3 (three) times a week.        . ezetimibe (ZETIA) 10 MG tablet Take 10 mg by mouth daily.        . Multiple Vitamin (MULTIVITAMIN) tablet Take 1 tablet by mouth daily.        . naproxen sodium (ANAPROX) 220 MG tablet Take 440 mg by mouth daily as needed.       . zafirlukast (ACCOLATE) 20 MG tablet Take 20 mg by mouth 2 (two) times daily.        [name] zolpidem (AMBIEN) 5 MG tablet Take 5 mg by mouth at bedtime as needed.        [name] lisinopril (PRINIVIL,ZESTRIL) 10 MG tablet Take 1 tablet (10 mg total) by mouth daily.  30 tablet  6    Allergies  Allergen Reactions  . Oxycodone   . Sulfonamide Derivatives  Review of Systems negative except from HPI and PMH  Physical Exam BP 126/88  Pulse 68  Wt 189 lb 8 oz (85.957 kg) Well developed and well nourished in no acute distress HENT normal E scleral and icterus clear Neck Supple JVP flat; carotids brisk and full Clear to ausculation  Regular rate and rhythm, no murmurs gallops or rub Soft with active bowel sounds No clubbing cyanosis no Edema Alert and oriented, grossly normal motor and sensory function Skin Warm and Dry  ECG demonstrates AV pacing with CRT configuration Assessment and  Plan

## 2012-08-25 NOTE — Assessment & Plan Note (Signed)
Euvolemic and well compensated, currently class II. At some point we can consider the addition of Aldactone.

## 2012-08-25 NOTE — Assessment & Plan Note (Signed)
This chronotropic competence seems to be reasonable for his device

## 2012-08-25 NOTE — Patient Instructions (Addendum)
Your physician wants you to follow-up in: 1 year with Dr. Graciela Husbands. You will receive a reminder letter in the mail two months in advance. If you don't receive a letter, please call our office to schedule the follow-up appointment.  Your physician has recommended you make the following change in your medication:  Stop Plavix

## 2012-08-25 NOTE — Assessment & Plan Note (Addendum)
The patient had a two-month episode of atrial fibrillation earlier this spring. I missed it on his transmitted reports. He has a thromboembolic risk profile notable for age-4, hypertension-1, vascular disease-1, heart failure-1. His CHADS-VASc score is 4. It is appropriate to consider oral anticoagulation and I have given him the name of the NOACs discuss with his pharmacy about the cost. These could be added to his aspirin  He will follow up with his PCP in the next few months and I will defer to the initiation of that to the patient's decision taccept the costs of those medications.

## 2012-09-21 DIAGNOSIS — R339 Retention of urine, unspecified: Secondary | ICD-10-CM | POA: Insufficient documentation

## 2012-09-21 DIAGNOSIS — N529 Male erectile dysfunction, unspecified: Secondary | ICD-10-CM | POA: Insufficient documentation

## 2012-09-21 DIAGNOSIS — R972 Elevated prostate specific antigen [PSA]: Secondary | ICD-10-CM | POA: Insufficient documentation

## 2012-09-21 DIAGNOSIS — Z8042 Family history of malignant neoplasm of prostate: Secondary | ICD-10-CM | POA: Insufficient documentation

## 2012-09-21 DIAGNOSIS — N430 Encysted hydrocele: Secondary | ICD-10-CM | POA: Insufficient documentation

## 2012-09-21 DIAGNOSIS — N138 Other obstructive and reflux uropathy: Secondary | ICD-10-CM | POA: Insufficient documentation

## 2012-11-28 ENCOUNTER — Encounter: Payer: Self-pay | Admitting: Internal Medicine

## 2012-11-28 ENCOUNTER — Ambulatory Visit (INDEPENDENT_AMBULATORY_CARE_PROVIDER_SITE_OTHER): Payer: Medicare Other | Admitting: *Deleted

## 2012-11-28 DIAGNOSIS — I5022 Chronic systolic (congestive) heart failure: Secondary | ICD-10-CM

## 2012-11-28 DIAGNOSIS — I2589 Other forms of chronic ischemic heart disease: Secondary | ICD-10-CM

## 2012-11-28 DIAGNOSIS — Z95810 Presence of automatic (implantable) cardiac defibrillator: Secondary | ICD-10-CM

## 2012-12-05 LAB — REMOTE ICD DEVICE
AL IMPEDENCE ICD: 399 Ohm
ATRIAL PACING ICD: 98.29 pct
BRDY-0003RV: 140 {beats}/min
BRDY-0004RV: 140 {beats}/min
LV LEAD IMPEDENCE ICD: 817 Ohm
PACEART VT: 0
RV LEAD THRESHOLD: 0.75 V
TOT-0002: 0
TOT-0006: 20120813000000
TZAT-0001ATACH: 1
TZAT-0001ATACH: 3
TZAT-0001FASTVT: 1
TZAT-0001SLOWVT: 1
TZAT-0001SLOWVT: 2
TZAT-0002ATACH: NEGATIVE
TZAT-0002ATACH: NEGATIVE
TZAT-0002FASTVT: NEGATIVE
TZAT-0005SLOWVT: 91 pct
TZAT-0012ATACH: 150 ms
TZAT-0013SLOWVT: 2
TZAT-0013SLOWVT: 2
TZAT-0018ATACH: NEGATIVE
TZAT-0018ATACH: NEGATIVE
TZAT-0018SLOWVT: NEGATIVE
TZAT-0018SLOWVT: NEGATIVE
TZAT-0019ATACH: 6 V
TZAT-0019ATACH: 6 V
TZAT-0019FASTVT: 8 V
TZAT-0019SLOWVT: 8 V
TZAT-0019SLOWVT: 8 V
TZAT-0020SLOWVT: 1.5 ms
TZON-0004SLOWVT: 16
TZON-0004VSLOWVT: 20
TZON-0005SLOWVT: 12
TZST-0001ATACH: 5
TZST-0001ATACH: 6
TZST-0001FASTVT: 3
TZST-0001FASTVT: 4
TZST-0001FASTVT: 5
TZST-0001SLOWVT: 3
TZST-0001SLOWVT: 6
TZST-0002ATACH: NEGATIVE
TZST-0002ATACH: NEGATIVE
TZST-0002ATACH: NEGATIVE
TZST-0002FASTVT: NEGATIVE
TZST-0002FASTVT: NEGATIVE
TZST-0002FASTVT: NEGATIVE
TZST-0003SLOWVT: 25 J
VENTRICULAR PACING ICD: 99.99 pct

## 2012-12-15 ENCOUNTER — Telehealth: Payer: Self-pay | Admitting: Internal Medicine

## 2012-12-15 NOTE — Telephone Encounter (Signed)
Pt is having muscles spasms and he thinks it from his pacer and wants to talk about it

## 2012-12-15 NOTE — Telephone Encounter (Signed)
Called and spoke with this patient about his muscle spasms. He states this is not a new problem, he said last time he had this on the left side Dr. Graciela Husbands turned down the voltage and he was better. He started having this problem again about a month ago and wants to have it fixed soon because it is uncomfortable,feels like a bad hiccup. After speaking with Amber, RN, she suggested him see the device clinic on Monday, 30th. Patient is pleased to come in on Monday to get this done. Schedulers are aware to call patient and schedule appointment.

## 2012-12-19 ENCOUNTER — Encounter: Payer: Self-pay | Admitting: Internal Medicine

## 2012-12-19 ENCOUNTER — Ambulatory Visit (INDEPENDENT_AMBULATORY_CARE_PROVIDER_SITE_OTHER): Payer: Medicare Other | Admitting: *Deleted

## 2012-12-19 DIAGNOSIS — I5022 Chronic systolic (congestive) heart failure: Secondary | ICD-10-CM

## 2012-12-19 DIAGNOSIS — Z95810 Presence of automatic (implantable) cardiac defibrillator: Secondary | ICD-10-CM

## 2012-12-19 DIAGNOSIS — I442 Atrioventricular block, complete: Secondary | ICD-10-CM

## 2012-12-19 LAB — ICD DEVICE OBSERVATION
AL AMPLITUDE: 0.6 mv
AL IMPEDENCE ICD: 342 Ohm
BAMS-0001: 170 {beats}/min
LV LEAD IMPEDENCE ICD: 817 Ohm
LV LEAD THRESHOLD: 1.75 V
RV LEAD IMPEDENCE ICD: 380 Ohm
TZAT-0002ATACH: NEGATIVE
TZAT-0002ATACH: NEGATIVE
TZAT-0004SLOWVT: 8
TZAT-0004SLOWVT: 8
TZAT-0005SLOWVT: 84 pct
TZAT-0005SLOWVT: 91 pct
TZAT-0011SLOWVT: 10 ms
TZAT-0011SLOWVT: 10 ms
TZAT-0012ATACH: 150 ms
TZAT-0012ATACH: 150 ms
TZAT-0012FASTVT: 200 ms
TZAT-0012SLOWVT: 200 ms
TZAT-0012SLOWVT: 200 ms
TZAT-0018ATACH: NEGATIVE
TZAT-0019ATACH: 6 V
TZAT-0020ATACH: 1.5 ms
TZAT-0020ATACH: 1.5 ms
TZAT-0020ATACH: 1.5 ms
TZAT-0020FASTVT: 1.5 ms
TZON-0003ATACH: 350 ms
TZON-0003SLOWVT: 340 ms
TZON-0003VSLOWVT: 400 ms
TZST-0001ATACH: 4
TZST-0001FASTVT: 2
TZST-0001FASTVT: 6
TZST-0001SLOWVT: 4
TZST-0002ATACH: NEGATIVE
TZST-0002FASTVT: NEGATIVE
TZST-0002FASTVT: NEGATIVE
TZST-0003SLOWVT: 15 J
TZST-0003SLOWVT: 35 J
TZST-0003SLOWVT: 35 J

## 2012-12-19 NOTE — Progress Notes (Signed)
Pt seen in clinic for follow up diaphragmatic stim with ICD.  No complaints of chest pain, shortness of breath, dizziness, palpitations, or shocks.  Device functioning normally at this time. RA and LV outputs decreased.    For full details, see PaceArt report.  No programming changes made today.  Plan to follow up in 3 months with [facility] as scheduled.   Gypsy Balsam, RN, BSN 12/19/2012 11:26 AM

## 2012-12-22 ENCOUNTER — Encounter: Payer: Self-pay | Admitting: *Deleted

## 2013-03-06 ENCOUNTER — Ambulatory Visit (INDEPENDENT_AMBULATORY_CARE_PROVIDER_SITE_OTHER): Payer: Medicare Other | Admitting: *Deleted

## 2013-03-06 DIAGNOSIS — I2589 Other forms of chronic ischemic heart disease: Secondary | ICD-10-CM

## 2013-03-06 DIAGNOSIS — Z95810 Presence of automatic (implantable) cardiac defibrillator: Secondary | ICD-10-CM

## 2013-03-06 DIAGNOSIS — I5022 Chronic systolic (congestive) heart failure: Secondary | ICD-10-CM

## 2013-03-07 ENCOUNTER — Other Ambulatory Visit: Payer: Self-pay

## 2013-03-07 ENCOUNTER — Encounter: Payer: Self-pay | Admitting: Internal Medicine

## 2013-03-12 LAB — REMOTE ICD DEVICE
AL AMPLITUDE: 0.9 mv
BAMS-0001: 170 {beats}/min
BATTERY VOLTAGE: 3.1023 V
BRDY-0002RV: 60 {beats}/min
BRDY-0003RV: 140 {beats}/min
FVT: 0
PACEART VT: 0
RV LEAD AMPLITUDE: 6.1 mv
RV LEAD IMPEDENCE ICD: 399 Ohm
RV LEAD THRESHOLD: 0.875 V
TZAT-0001ATACH: 1
TZAT-0001ATACH: 2
TZAT-0001FASTVT: 1
TZAT-0004SLOWVT: 8
TZAT-0004SLOWVT: 8
TZAT-0005SLOWVT: 91 pct
TZAT-0011SLOWVT: 10 ms
TZAT-0011SLOWVT: 10 ms
TZAT-0012ATACH: 150 ms
TZAT-0012ATACH: 150 ms
TZAT-0012FASTVT: 200 ms
TZAT-0012SLOWVT: 200 ms
TZAT-0012SLOWVT: 200 ms
TZAT-0013SLOWVT: 2
TZAT-0013SLOWVT: 2
TZAT-0018ATACH: NEGATIVE
TZAT-0018FASTVT: NEGATIVE
TZAT-0020ATACH: 1.5 ms
TZAT-0020ATACH: 1.5 ms
TZAT-0020ATACH: 1.5 ms
TZAT-0020FASTVT: 1.5 ms
TZAT-0020SLOWVT: 1.5 ms
TZON-0003ATACH: 350 ms
TZON-0003SLOWVT: 340 ms
TZON-0003VSLOWVT: 400 ms
TZON-0004SLOWVT: 16
TZST-0001ATACH: 4
TZST-0001FASTVT: 2
TZST-0001FASTVT: 4
TZST-0001FASTVT: 6
TZST-0001SLOWVT: 4
TZST-0001SLOWVT: 5
TZST-0001SLOWVT: 6
TZST-0002FASTVT: NEGATIVE
TZST-0002FASTVT: NEGATIVE
TZST-0002FASTVT: NEGATIVE
TZST-0003SLOWVT: 15 J
TZST-0003SLOWVT: 35 J
VF: 0

## 2013-03-23 ENCOUNTER — Encounter: Payer: Self-pay | Admitting: *Deleted

## 2013-06-05 ENCOUNTER — Ambulatory Visit (INDEPENDENT_AMBULATORY_CARE_PROVIDER_SITE_OTHER): Payer: Medicare Other | Admitting: *Deleted

## 2013-06-05 ENCOUNTER — Encounter: Payer: Self-pay | Admitting: Internal Medicine

## 2013-06-05 DIAGNOSIS — Z95810 Presence of automatic (implantable) cardiac defibrillator: Secondary | ICD-10-CM

## 2013-06-05 DIAGNOSIS — I2589 Other forms of chronic ischemic heart disease: Secondary | ICD-10-CM

## 2013-06-05 DIAGNOSIS — I5022 Chronic systolic (congestive) heart failure: Secondary | ICD-10-CM

## 2013-06-20 LAB — REMOTE ICD DEVICE
BAMS-0001: 170 {beats}/min
BATTERY VOLTAGE: 3.0955 V
BRDY-0002RV: 60 {beats}/min
BRDY-0003RV: 140 {beats}/min
FVT: 0
PACEART VT: 0
RV LEAD AMPLITUDE: 7.1 mv
RV LEAD THRESHOLD: 0.75 V
TOT-0002: 0
TZAT-0001ATACH: 1
TZAT-0001ATACH: 2
TZAT-0001ATACH: 3
TZAT-0001FASTVT: 1
TZAT-0004SLOWVT: 8
TZAT-0004SLOWVT: 8
TZAT-0005SLOWVT: 84 pct
TZAT-0005SLOWVT: 91 pct
TZAT-0011SLOWVT: 10 ms
TZAT-0011SLOWVT: 10 ms
TZAT-0012ATACH: 150 ms
TZAT-0012ATACH: 150 ms
TZAT-0012ATACH: 150 ms
TZAT-0012SLOWVT: 200 ms
TZAT-0012SLOWVT: 200 ms
TZAT-0013SLOWVT: 2
TZAT-0013SLOWVT: 2
TZAT-0018ATACH: NEGATIVE
TZAT-0018ATACH: NEGATIVE
TZAT-0018FASTVT: NEGATIVE
TZAT-0019ATACH: 6 V
TZAT-0020ATACH: 1.5 ms
TZAT-0020ATACH: 1.5 ms
TZON-0003ATACH: 350 ms
TZON-0003SLOWVT: 340 ms
TZON-0004SLOWVT: 16
TZON-0004VSLOWVT: 20
TZST-0001ATACH: 4
TZST-0001ATACH: 6
TZST-0001FASTVT: 2
TZST-0001FASTVT: 4
TZST-0001FASTVT: 6
TZST-0001SLOWVT: 6
TZST-0002ATACH: NEGATIVE
TZST-0002FASTVT: NEGATIVE
TZST-0002FASTVT: NEGATIVE
TZST-0003SLOWVT: 15 J
TZST-0003SLOWVT: 25 J
TZST-0003SLOWVT: 35 J

## 2013-06-27 ENCOUNTER — Encounter: Payer: Self-pay | Admitting: *Deleted

## 2013-08-17 ENCOUNTER — Encounter: Payer: Self-pay | Admitting: Internal Medicine

## 2013-08-17 ENCOUNTER — Ambulatory Visit (INDEPENDENT_AMBULATORY_CARE_PROVIDER_SITE_OTHER): Payer: Medicare Other | Admitting: Internal Medicine

## 2013-08-17 VITALS — BP 104/72 | HR 63 | Ht 67.0 in | Wt 190.2 lb

## 2013-08-17 DIAGNOSIS — I5022 Chronic systolic (congestive) heart failure: Secondary | ICD-10-CM

## 2013-08-17 DIAGNOSIS — I442 Atrioventricular block, complete: Secondary | ICD-10-CM

## 2013-08-17 DIAGNOSIS — I459 Conduction disorder, unspecified: Secondary | ICD-10-CM

## 2013-08-17 DIAGNOSIS — R001 Bradycardia, unspecified: Secondary | ICD-10-CM

## 2013-08-17 DIAGNOSIS — I4891 Unspecified atrial fibrillation: Secondary | ICD-10-CM

## 2013-08-17 DIAGNOSIS — I2589 Other forms of chronic ischemic heart disease: Secondary | ICD-10-CM

## 2013-08-17 DIAGNOSIS — I495 Sick sinus syndrome: Secondary | ICD-10-CM

## 2013-08-17 DIAGNOSIS — Z95810 Presence of automatic (implantable) cardiac defibrillator: Secondary | ICD-10-CM

## 2013-08-17 LAB — ICD DEVICE OBSERVATION
ATRIAL PACING ICD: 94.25 pct
BAMS-0001: 170 {beats}/min
BATTERY VOLTAGE: 3.0614 V
FVT: 0
LV LEAD IMPEDENCE ICD: 779 Ohm
LV LEAD THRESHOLD: 1.25 V
PACEART VT: 0
TOT-0006: 20120813000000
TZAT-0001ATACH: 1
TZAT-0001FASTVT: 1
TZAT-0004SLOWVT: 8
TZAT-0004SLOWVT: 8
TZAT-0005SLOWVT: 91 pct
TZAT-0011SLOWVT: 10 ms
TZAT-0011SLOWVT: 10 ms
TZAT-0012ATACH: 150 ms
TZAT-0012ATACH: 150 ms
TZAT-0012FASTVT: 200 ms
TZAT-0012SLOWVT: 200 ms
TZAT-0012SLOWVT: 200 ms
TZAT-0013SLOWVT: 2
TZAT-0013SLOWVT: 2
TZAT-0018ATACH: NEGATIVE
TZAT-0020ATACH: 1.5 ms
TZAT-0020ATACH: 1.5 ms
TZAT-0020ATACH: 1.5 ms
TZAT-0020FASTVT: 1.5 ms
TZON-0003ATACH: 350 ms
TZON-0003SLOWVT: 340 ms
TZON-0003VSLOWVT: 400 ms
TZON-0004SLOWVT: 16
TZST-0001ATACH: 4
TZST-0001FASTVT: 2
TZST-0001FASTVT: 6
TZST-0001SLOWVT: 4
TZST-0001SLOWVT: 5
TZST-0001SLOWVT: 6
TZST-0002ATACH: NEGATIVE
TZST-0002FASTVT: NEGATIVE
TZST-0002FASTVT: NEGATIVE
TZST-0002FASTVT: NEGATIVE
TZST-0003SLOWVT: 15 J
TZST-0003SLOWVT: 35 J
VENTRICULAR PACING ICD: 99.98 pct
VF: 0

## 2013-08-17 NOTE — Patient Instructions (Addendum)
Your physician wants you to follow-up in: 1 YEAR.  You will receive a reminder letter in the mail two months in advance. If you don't receive a letter, please call our office to schedule the follow-up appointment.  Please give the office a call when you make a decision about blood thinner:  Eliquis 5 mg twice a day  Xarelto 20 mg once a day  Pradaxa 150 mg twice a day

## 2013-08-17 NOTE — Assessment & Plan Note (Signed)
Continue current medications. 

## 2013-08-17 NOTE — Assessment & Plan Note (Signed)
The patient's device was interrogated.  The information was reviewed. No changes were made in the programming.   Reviewed extensively with the patient showing atrial fibrillation and loss of atrial capture

## 2013-08-17 NOTE — Assessment & Plan Note (Signed)
Euvolemic. Class IB symptoms

## 2013-08-17 NOTE — Assessment & Plan Note (Signed)
100% atrial pacing with good heart rate excursion

## 2013-08-17 NOTE — Progress Notes (Signed)
Patient Care Team: [name], MD as PCP - General (Unknown Physician Specialty) [name], MD (Unknown Physician Specialty)   HPI  [name] is a 72 y.o. male Seen in followup for ICD implantation initially for syncope in the setting of depressed left ventricular function. He had a 6949-lead. He reached ERI summer 2012 and underwent device explantation with new defibrillator lead insertion with left ventricular lead placement and generator replacement.  He had been having problems with shortness of breath. He was 100% ventricularly paced. He had undergone CPX testing with a VO2 max of 22.4.  He has a history of ischemic cardiomyopathy with prior stenting of an LAD. Catheterization 2012 demonstrated a patent stent and nonobstructive coronary disease. At that the time ejection fraction was 40%; He underwent stenting in [place] around 2007; Plavix was stopped last year  He was noted last year to have atrial fibrillation. The initiation of anticoagulation wasto be discussed with his PCP.  The patient was able to "the rail" a discussion with his PCP and nothing was accomplished.  His thromboembolic risk profile is normal for TIA, hypertension, age , vascular disease and heart failure. His CHADS-2 score his 4 and his CHADS-VASc score is 6  The patient denies chest pain, shortness of breath, nocturnal dyspnea, orthopnea or peripheral edema. There have been no palpitations, lightheadedness or syncope. Exercise tolerance remains quite good although not great   Past Medical History  Diagnosis Date  . ischemic cardiomyopathy     stent to a 95%-occluded LAD in 1995; catheterization in 2012 demonstrated patent grafts and nonobstructive disease  . Syncope     recurrent  . Cardiac arrest   . Biventricular defibrillator-Medtronic     Generator replacement 2012 with CRT upgrade  . Severe sinus bradycardia   . High-grade heart block   . TIA (transient ischemic attack) nov. 2011  . GERD  (gastroesophageal reflux disease)   . Allergic rhinitis   . Dyspnea     PFT 12/19/10: FEV1 2.93(107%), FEV1% 72, TLC 8.02(138%), DLCO 110%, +BD    Past Surgical History  Procedure Laterality Date  . Implantation of medtronic dual-chamber cardioverter    . Tonsillectomy  1948  . Left knee replacement  2006    Current Outpatient Prescriptions  Medication Sig Dispense Refill  . aspirin 81 MG EC tablet Take 81 mg by mouth daily.       [name] atorvastatin (LIPITOR) 10 MG tablet Take 10 mg by mouth daily.        . bisoprolol (ZEBETA) 5 MG tablet Take 1/2 tablet as directed.  30 tablet  6  . calcium-vitamin D (OSCAL 500/200 D-3) 500-200 MG-UNIT per tablet Take 1 tablet by mouth daily.        [name] dutasteride (AVODART) 0.5 MG capsule Take 0.5 mg by mouth 3 (three) times a week.        . ezetimibe (ZETIA) 10 MG tablet Take 10 mg by mouth daily.        [name] lisinopril (PRINIVIL,ZESTRIL) 10 MG tablet Take 1 tablet (10 mg total) by mouth daily.  30 tablet  6  . Multiple Vitamin (MULTIVITAMIN) tablet Take 1 tablet by mouth daily.        . naproxen sodium (ANAPROX) 220 MG tablet Take 440 mg by mouth daily as needed.       . zolpidem (AMBIEN) 5 MG tablet Take 5 mg by mouth at bedtime as needed.         No current facility-administered medications  for this visit.    Allergies  Allergen Reactions  . Oxycodone   . Sulfonamide Derivatives     Review of Systems negative except from HPI and PMH  Physical Exam BP 104/72  Pulse 63  Ht 5\' 7"  (1.702 m)  Wt 190 lb 4 oz (86.297 kg)  BMI 29.79 kg/m2 Well developed and well nourished in no acute distress HENT normal E scleral and icterus clear Neck Supple JVP flat; carotids brisk and full Clear to ausculation Device pocket well healed; without hematoma or erythema.  There is no tethering r.egular rate and rhythm, no murmurs gallops or rub Soft with active bowel sounds No clubbing cyanosis none Edema Alert and oriented, grossly normal motor and sensory  function Skin Warm and Dry  ECG demonstrates AV biventricular pacing   Assessment and  Plan

## 2013-08-17 NOTE — Assessment & Plan Note (Signed)
Stable post pacing 

## 2013-08-17 NOTE — Assessment & Plan Note (Addendum)
Greater than 30 minute discussion regarding coagulation, risks benefits of warfarin and NOACs in the context of a CHADS-VASc score of 6 and a CHADS-2 score of 4. He will review ocost w  his pharmacist  The patient has had frequent short episodes( 1-4 hours) as well as 1 two week episodes of atrial fibrillation detected

## 2013-09-01 ENCOUNTER — Encounter: Payer: Self-pay | Admitting: Internal Medicine

## 2013-11-20 ENCOUNTER — Ambulatory Visit (INDEPENDENT_AMBULATORY_CARE_PROVIDER_SITE_OTHER): Payer: Medicare Other | Admitting: *Deleted

## 2013-11-20 ENCOUNTER — Encounter: Payer: Self-pay | Admitting: Internal Medicine

## 2013-11-20 DIAGNOSIS — I5022 Chronic systolic (congestive) heart failure: Secondary | ICD-10-CM

## 2013-11-20 DIAGNOSIS — I442 Atrioventricular block, complete: Secondary | ICD-10-CM

## 2013-11-20 LAB — MDC_IDC_ENUM_SESS_TYPE_REMOTE
Brady Statistic AP VP Percent: 99.91 %
Brady Statistic AP VS Percent: 0.01 %
Brady Statistic AS VS Percent: 0 %
Date Time Interrogation Session: 20141201140204
HighPow Impedance: 44 Ohm
Lead Channel Impedance Value: 380 Ohm
Lead Channel Impedance Value: 399 Ohm
Lead Channel Impedance Value: 494 Ohm
Lead Channel Pacing Threshold Amplitude: 0.875 V
Lead Channel Pacing Threshold Pulse Width: 0.4 ms
Lead Channel Sensing Intrinsic Amplitude: 1.375 mV
Lead Channel Sensing Intrinsic Amplitude: 7.25 mV
Lead Channel Setting Sensing Sensitivity: 0.3 mV
Zone Setting Detection Interval: 300 ms

## 2014-02-21 ENCOUNTER — Encounter: Payer: Medicare Other | Admitting: *Deleted

## 2014-02-27 ENCOUNTER — Ambulatory Visit (INDEPENDENT_AMBULATORY_CARE_PROVIDER_SITE_OTHER): Payer: Medicare Other | Admitting: *Deleted

## 2014-02-27 DIAGNOSIS — I5022 Chronic systolic (congestive) heart failure: Secondary | ICD-10-CM

## 2014-02-27 DIAGNOSIS — Z95810 Presence of automatic (implantable) cardiac defibrillator: Secondary | ICD-10-CM

## 2014-02-27 DIAGNOSIS — I2589 Other forms of chronic ischemic heart disease: Secondary | ICD-10-CM

## 2014-03-04 LAB — MDC_IDC_ENUM_SESS_TYPE_REMOTE
Brady Statistic AP VP Percent: 98.53 %
Brady Statistic AP VS Percent: 0.01 %
Brady Statistic AS VP Percent: 1.46 %
Brady Statistic AS VS Percent: 0.01 %
HIGH POWER IMPEDANCE MEASURED VALUE: 247 Ohm
HIGH POWER IMPEDANCE MEASURED VALUE: 266 Ohm
HighPow Impedance: 49 Ohm
HighPow Impedance: 62 Ohm
Lead Channel Impedance Value: 399 Ohm
Lead Channel Impedance Value: 399 Ohm
Lead Channel Impedance Value: 437 Ohm
Lead Channel Impedance Value: 532 Ohm
Lead Channel Impedance Value: 817 Ohm
Lead Channel Sensing Intrinsic Amplitude: 1.125 mV
Lead Channel Setting Pacing Amplitude: 2 V
Lead Channel Setting Pacing Amplitude: 2 V
Lead Channel Setting Pacing Amplitude: 2 V
Lead Channel Setting Pacing Pulse Width: 0.8 ms
MDC IDC MSMT BATTERY VOLTAGE: 3.02 V
MDC IDC MSMT LEADCHNL LV PACING THRESHOLD AMPLITUDE: 1.875 V
MDC IDC MSMT LEADCHNL LV PACING THRESHOLD PULSEWIDTH: 0.4 ms
MDC IDC MSMT LEADCHNL RA SENSING INTR AMPL: 1.125 mV
MDC IDC MSMT LEADCHNL RV PACING THRESHOLD AMPLITUDE: 0.875 V
MDC IDC MSMT LEADCHNL RV PACING THRESHOLD PULSEWIDTH: 0.4 ms
MDC IDC MSMT LEADCHNL RV SENSING INTR AMPL: 3.75 mV
MDC IDC MSMT LEADCHNL RV SENSING INTR AMPL: 3.75 mV
MDC IDC SESS DTM: 20150310191956
MDC IDC SET LEADCHNL RV PACING PULSEWIDTH: 0.4 ms
MDC IDC SET LEADCHNL RV SENSING SENSITIVITY: 0.3 mV
MDC IDC SET ZONE DETECTION INTERVAL: 340 ms
MDC IDC SET ZONE DETECTION INTERVAL: 400 ms
MDC IDC STAT BRADY RA PERCENT PACED: 98.53 %
MDC IDC STAT BRADY RV PERCENT PACED: 99.99 %
Zone Setting Detection Interval: 300 ms
Zone Setting Detection Interval: 350 ms

## 2014-03-13 ENCOUNTER — Encounter: Payer: Self-pay | Admitting: *Deleted

## 2014-03-20 ENCOUNTER — Encounter: Payer: Self-pay | Admitting: Internal Medicine

## 2014-05-02 ENCOUNTER — Ambulatory Visit: Payer: Self-pay | Admitting: Ophthalmology

## 2014-05-17 ENCOUNTER — Ambulatory Visit: Payer: Self-pay | Admitting: Ophthalmology

## 2014-05-31 ENCOUNTER — Ambulatory Visit (INDEPENDENT_AMBULATORY_CARE_PROVIDER_SITE_OTHER): Payer: Medicare Other | Admitting: *Deleted

## 2014-05-31 ENCOUNTER — Telehealth: Payer: Self-pay | Admitting: Cardiology

## 2014-05-31 DIAGNOSIS — I2510 Atherosclerotic heart disease of native coronary artery without angina pectoris: Secondary | ICD-10-CM | POA: Insufficient documentation

## 2014-05-31 DIAGNOSIS — M51369 Other intervertebral disc degeneration, lumbar region without mention of lumbar back pain or lower extremity pain: Secondary | ICD-10-CM | POA: Insufficient documentation

## 2014-05-31 DIAGNOSIS — I2589 Other forms of chronic ischemic heart disease: Secondary | ICD-10-CM

## 2014-05-31 DIAGNOSIS — IMO0002 Reserved for concepts with insufficient information to code with codable children: Secondary | ICD-10-CM | POA: Insufficient documentation

## 2014-05-31 DIAGNOSIS — M5136 Other intervertebral disc degeneration, lumbar region: Secondary | ICD-10-CM | POA: Insufficient documentation

## 2014-05-31 DIAGNOSIS — I5022 Chronic systolic (congestive) heart failure: Secondary | ICD-10-CM

## 2014-05-31 DIAGNOSIS — K219 Gastro-esophageal reflux disease without esophagitis: Secondary | ICD-10-CM | POA: Insufficient documentation

## 2014-05-31 LAB — MDC_IDC_ENUM_SESS_TYPE_REMOTE
Battery Voltage: 3.01 V
Brady Statistic AP VP Percent: 99.79 %
Brady Statistic AS VP Percent: 0.2 %
Brady Statistic RA Percent Paced: 99.8 %
Brady Statistic RV Percent Paced: 99.99 %
Date Time Interrogation Session: 20150611172425
HighPow Impedance: 209 Ohm
HighPow Impedance: 247 Ohm
HighPow Impedance: 48 Ohm
HighPow Impedance: 59 Ohm
Lead Channel Impedance Value: 399 Ohm
Lead Channel Impedance Value: 399 Ohm
Lead Channel Impedance Value: 399 Ohm
Lead Channel Impedance Value: 779 Ohm
Lead Channel Pacing Threshold Pulse Width: 0.4 ms
Lead Channel Pacing Threshold Pulse Width: 0.4 ms
Lead Channel Sensing Intrinsic Amplitude: 1.625 mV
Lead Channel Setting Pacing Amplitude: 2 V
Lead Channel Setting Pacing Pulse Width: 0.8 ms
Lead Channel Setting Sensing Sensitivity: 0.3 mV
MDC IDC MSMT LEADCHNL LV IMPEDANCE VALUE: 570 Ohm
MDC IDC MSMT LEADCHNL LV PACING THRESHOLD AMPLITUDE: 1.875 V
MDC IDC MSMT LEADCHNL RA SENSING INTR AMPL: 1.625 mV
MDC IDC MSMT LEADCHNL RV PACING THRESHOLD AMPLITUDE: 0.875 V
MDC IDC MSMT LEADCHNL RV SENSING INTR AMPL: 7 mV
MDC IDC MSMT LEADCHNL RV SENSING INTR AMPL: 7 mV
MDC IDC SET LEADCHNL LV PACING AMPLITUDE: 2 V
MDC IDC SET LEADCHNL RA PACING AMPLITUDE: 2 V
MDC IDC SET LEADCHNL RV PACING PULSEWIDTH: 0.4 ms
MDC IDC SET ZONE DETECTION INTERVAL: 340 ms
MDC IDC STAT BRADY AP VS PERCENT: 0.01 %
MDC IDC STAT BRADY AS VS PERCENT: 0 %
Zone Setting Detection Interval: 300 ms
Zone Setting Detection Interval: 350 ms
Zone Setting Detection Interval: 400 ms

## 2014-05-31 NOTE — Telephone Encounter (Signed)
Spoke with pt and reminded pt of remote transmission that is due today. Pt verbalized understanding.   

## 2014-05-31 NOTE — Progress Notes (Signed)
Remote ICD transmission.   

## 2014-06-02 ENCOUNTER — Emergency Department: Payer: Self-pay | Admitting: Emergency Medicine

## 2014-06-02 LAB — URINALYSIS, COMPLETE
BACTERIA: NONE SEEN
BILIRUBIN, UR: NEGATIVE
Blood: NEGATIVE
GLUCOSE, UR: NEGATIVE mg/dL (ref 0–75)
KETONE: NEGATIVE
Nitrite: NEGATIVE
PH: 5 (ref 4.5–8.0)
Protein: NEGATIVE
SPECIFIC GRAVITY: 1.021 (ref 1.003–1.030)
SQUAMOUS EPITHELIAL: NONE SEEN
WBC UR: 3 /HPF (ref 0–5)

## 2014-06-02 LAB — COMPREHENSIVE METABOLIC PANEL
ALBUMIN: 3.9 g/dL (ref 3.4–5.0)
ALK PHOS: 62 U/L
ALT: 38 U/L (ref 12–78)
Anion Gap: 8 (ref 7–16)
BUN: 29 mg/dL — AB (ref 7–18)
Bilirubin,Total: 0.8 mg/dL (ref 0.2–1.0)
CALCIUM: 9.2 mg/dL (ref 8.5–10.1)
CHLORIDE: 104 mmol/L (ref 98–107)
CO2: 28 mmol/L (ref 21–32)
CREATININE: 0.97 mg/dL (ref 0.60–1.30)
EGFR (African American): >60
Glucose: 184 mg/dL — ABNORMAL HIGH (ref 65–99)
Osmolality: 290 (ref 275–301)
Potassium: 4.2 mmol/L (ref 3.5–5.1)
SGOT(AST): 26 U/L (ref 15–37)
Sodium: 140 mmol/L (ref 136–145)
Total Protein: 6.6 g/dL (ref 6.4–8.2)

## 2014-06-02 LAB — TROPONIN I

## 2014-06-02 LAB — CK TOTAL AND CKMB (NOT AT ARMC)
CK, Total: 119 U/L
CK-MB: 4.1 ng/mL — AB (ref 0.5–3.6)

## 2014-06-02 LAB — CBC
HCT: 45 % (ref 40.0–52.0)
HGB: 15 g/dL (ref 13.0–18.0)
MCH: 29.9 pg (ref 26.0–34.0)
MCHC: 33.4 g/dL (ref 32.0–36.0)
MCV: 89 fL (ref 80–100)
Platelet: 179 10*3/uL (ref 150–440)
RBC: 5.03 10*6/uL (ref 4.40–5.90)
RDW: 13.1 % (ref 11.5–14.5)
WBC: 6.3 10*3/uL (ref 3.8–10.6)

## 2014-06-05 ENCOUNTER — Ambulatory Visit: Payer: Self-pay | Admitting: Ophthalmology

## 2014-06-07 ENCOUNTER — Ambulatory Visit: Payer: Self-pay | Admitting: Physical Medicine and Rehabilitation

## 2014-06-26 ENCOUNTER — Encounter: Payer: Self-pay | Admitting: Cardiology

## 2014-07-03 ENCOUNTER — Encounter: Payer: Self-pay | Admitting: Internal Medicine

## 2014-07-03 DIAGNOSIS — M48062 Spinal stenosis, lumbar region with neurogenic claudication: Secondary | ICD-10-CM | POA: Insufficient documentation

## 2014-07-05 NOTE — Telephone Encounter (Signed)
Close encounter 

## 2014-08-08 DIAGNOSIS — J45909 Unspecified asthma, uncomplicated: Secondary | ICD-10-CM | POA: Insufficient documentation

## 2014-08-08 DIAGNOSIS — M1990 Unspecified osteoarthritis, unspecified site: Secondary | ICD-10-CM | POA: Insufficient documentation

## 2014-08-13 DIAGNOSIS — N411 Chronic prostatitis: Secondary | ICD-10-CM | POA: Insufficient documentation

## 2014-08-21 ENCOUNTER — Encounter: Payer: Medicare Other | Admitting: Internal Medicine

## 2014-08-24 ENCOUNTER — Ambulatory Visit (INDEPENDENT_AMBULATORY_CARE_PROVIDER_SITE_OTHER): Payer: Medicare Other | Admitting: Internal Medicine

## 2014-08-24 ENCOUNTER — Encounter: Payer: Self-pay | Admitting: Internal Medicine

## 2014-08-24 VITALS — BP 148/96 | HR 63 | Ht 66.0 in | Wt 189.8 lb

## 2014-08-24 DIAGNOSIS — Z95810 Presence of automatic (implantable) cardiac defibrillator: Secondary | ICD-10-CM

## 2014-08-24 DIAGNOSIS — I5022 Chronic systolic (congestive) heart failure: Secondary | ICD-10-CM

## 2014-08-24 DIAGNOSIS — I442 Atrioventricular block, complete: Secondary | ICD-10-CM

## 2014-08-24 DIAGNOSIS — I4891 Unspecified atrial fibrillation: Secondary | ICD-10-CM

## 2014-08-24 DIAGNOSIS — I2589 Other forms of chronic ischemic heart disease: Secondary | ICD-10-CM

## 2014-08-24 LAB — MDC_IDC_ENUM_SESS_TYPE_INCLINIC
Brady Statistic AP VP Percent: 99.52 %
Brady Statistic AS VP Percent: 0.47 %
Brady Statistic AS VS Percent: 0 %
Brady Statistic RA Percent Paced: 99.53 %
Date Time Interrogation Session: 20150904171727
HIGH POWER IMPEDANCE MEASURED VALUE: 247 Ohm
HIGH POWER IMPEDANCE MEASURED VALUE: 266 Ohm
HighPow Impedance: 44 Ohm
HighPow Impedance: 55 Ohm
Lead Channel Impedance Value: 342 Ohm
Lead Channel Impedance Value: 380 Ohm
Lead Channel Impedance Value: 513 Ohm
Lead Channel Impedance Value: 836 Ohm
Lead Channel Pacing Threshold Amplitude: 0.875 V
Lead Channel Sensing Intrinsic Amplitude: 0.5 mV
Lead Channel Sensing Intrinsic Amplitude: 0.5 mV
Lead Channel Setting Pacing Amplitude: 2 V
Lead Channel Setting Pacing Pulse Width: 0.4 ms
Lead Channel Setting Pacing Pulse Width: 0.8 ms
Lead Channel Setting Sensing Sensitivity: 0.3 mV
MDC IDC MSMT BATTERY VOLTAGE: 2.97 V
MDC IDC MSMT LEADCHNL LV PACING THRESHOLD AMPLITUDE: 1.875 V
MDC IDC MSMT LEADCHNL LV PACING THRESHOLD PULSEWIDTH: 0.4 ms
MDC IDC MSMT LEADCHNL RA IMPEDANCE VALUE: 380 Ohm
MDC IDC MSMT LEADCHNL RV PACING THRESHOLD PULSEWIDTH: 0.4 ms
MDC IDC MSMT LEADCHNL RV SENSING INTR AMPL: 7.375 mV
MDC IDC MSMT LEADCHNL RV SENSING INTR AMPL: 7.375 mV
MDC IDC SET LEADCHNL LV PACING AMPLITUDE: 2.5 V
MDC IDC SET LEADCHNL RV PACING AMPLITUDE: 2 V
MDC IDC SET ZONE DETECTION INTERVAL: 300 ms
MDC IDC SET ZONE DETECTION INTERVAL: 340 ms
MDC IDC SET ZONE DETECTION INTERVAL: 400 ms
MDC IDC STAT BRADY AP VS PERCENT: 0.01 %
MDC IDC STAT BRADY RV PERCENT PACED: 99.99 %
Zone Setting Detection Interval: 350 ms

## 2014-08-24 MED ORDER — APIXABAN 5 MG PO TABS: 5.0000 mg | ORAL_TABLET | Freq: Two times a day (BID) | ORAL | Status: DC

## 2014-08-24 NOTE — Patient Instructions (Addendum)
Your physician has recommended you make the following change in your medication:  Stop Aspirin  Start Eliquis 5 mg twice daily  Do not use Mobic frequently    Your physician recommends that you schedule a follow-up appointment in:  3 months with Dr. [name]   Remote monitoring is used to monitor your Pacemaker of ICD from home. This monitoring reduces the number of office visits required to check your device to one time per year. It allows [place] to keep an eye on the functioning of your device to ensure it is working properly. You are scheduled for a device check from home on 11/26/14. You may send your transmission at any time that day. If you have a wireless device, the transmission will be sent automatically. After your physician reviews your transmission, you will receive a postcard with your next transmission date.

## 2014-08-24 NOTE — Progress Notes (Signed)
Patient Care Team: Marcello Fennel, MD as PCP - General (Family Medicine) Rocco Serene, MD (Unknown Physician Specialty)   HPI  Ronnie [place] Channin Agustin. is a 73 y.o. male Seen in followup for ICD implantation initially for syncope in the setting of depressed left ventricular function. He had a 6949-lead. He reached ERI summer 2012 and underwent device explantation with new defibrillator lead insertion with left ventricular lead placement and generator replacement.  He had been having problems with shortness of breath. He was 100% ventricularly paced. He had undergone CPX testing with a VO2 max of 22.4.  He has a history of ischemic cardiomyopathy with prior stenting of an LAD. Catheterization 2012 demonstrated a patent stent and nonobstructive coronary disease. At that the time ejection fraction was 40%; He underwent stenting in [place] around 2007; Plavix was stopped last year  He was noted  to have atrial fibrillation.    His thromboembolic risk profile is notable for TIA, hypertension, age , vascular disease and heart failure. His CHADS-2 score his 4 and his CHADS-VASc score is 6  We had discussions last year regarding anticoagulation;  He has persisted in his use of ASA  The patient denies chest pain, shortness of breath, nocturnal dyspnea, orthopnea or peripheral edema. There have been no palpitations, lightheadedness or syncope. Exercise tolerance remains quite good although not great  He complains of thumping with positon   Past Medical History  Diagnosis Date  . ischemic cardiomyopathy     stent to a 95%-occluded LAD in 1995; catheterization in 2012 demonstrated patent grafts and nonobstructive disease  . Syncope     recurrent  . Cardiac arrest   . Biventricular defibrillator-Medtronic     Generator replacement 2012 with CRT upgrade  . Severe sinus bradycardia   . High-grade heart block   . TIA (transient ischemic attack) nov. 2011  . GERD (gastroesophageal reflux disease)    . Allergic rhinitis   . Dyspnea     PFT 12/19/10: FEV1 2.93(107%), FEV1% 72, TLC 8.02(138%), DLCO 110%, +BD  . Sciatic nerve pain   . GERD (gastroesophageal reflux disease)     Past Surgical History  Procedure Laterality Date  . Implantation of medtronic dual-chamber cardioverter    . Tonsillectomy  1948  . Left knee replacement  2006  . Cataract extraction, bilateral    . Prostate biopsy      Current Outpatient Prescriptions  Medication Sig Dispense Refill  . aspirin 81 MG EC tablet Take 81 mg by mouth daily.       [name] atorvastatin (LIPITOR) 10 MG tablet Take 10 mg by mouth daily.        . bisoprolol (ZEBETA) 5 MG tablet Take 1/2 tablet as directed.  30 tablet  6  . calcium-vitamin D (OSCAL 500/200 D-3) 500-200 MG-UNIT per tablet Take 1 tablet by mouth daily.        [name] dutasteride (AVODART) 0.5 MG capsule Take 0.5 mg by mouth 3 (three) times a week.        [name] GABAPENTIN PO Take by mouth daily.      [name] lisinopril (PRINIVIL,ZESTRIL) 10 MG tablet Take 1 tablet (10 mg total) by mouth daily.  30 tablet  6  . meloxicam (MOBIC) 15 MG tablet Take 15 mg by mouth daily.      . Multiple Vitamin (MULTIVITAMIN) tablet Take 1 tablet by mouth daily.        . traZODone (DESYREL) 50 MG tablet Take 50 mg by mouth as needed  for sleep.       No current facility-administered medications for this visit.    Allergies  Allergen Reactions  . Beta Adrenergic Blockers     Other reaction(s): Other (See Comments) Fatigue  . Oxycodone   . Sulfonamide Derivatives     Review of Systems negative except from HPI and PMH  Physical Exam BP 148/96  Pulse 63  Ht 5\' 6"  (1.676 m)  Wt 189 lb 12 oz (86.07 kg)  BMI 30.64 kg/m2 Well developed and well nourished in no acute distress HENT normal E scleral and icterus clear Neck Supple JVP flat; carotids brisk and full Clear to ausculation Device pocket well healed; without hematoma or erythema.  There is no tethering r.egular rate and rhythm, no murmurs  gallops or rub Soft with active bowel sounds No clubbing cyanosis none Edema Alert and oriented, grossly normal motor and sensory function Skin Warm and Dry  ECG demonstrates AV biventricular pacing   Assessment and  Plan \ Ischemic Cardiomyopathy  Diaphragmatic stimulation (DS)  Atrial fibrillation  HFrEF  Euvolemic continue current meds  Without symptoms of ischemia  Recurrent afib  Long discussion re NOAC and ASA  He will begin on apixoban 5 bid  Device reprogrammed to LV ring to avoid DS

## 2014-08-29 ENCOUNTER — Telehealth: Payer: Self-pay | Admitting: *Deleted

## 2014-08-29 NOTE — Telephone Encounter (Signed)
Patient has been approved for Eliquis 5 mg tablet #60 every 30 days. Approved through 08/28/14-08/29/15

## 2014-09-12 ENCOUNTER — Telehealth: Payer: Self-pay | Admitting: Cardiology

## 2014-09-12 NOTE — Telephone Encounter (Signed)
Pt called and stated that he last his device ID card and he needs a replacement. I informed him to call [employer] at [phone] to receive the replacement. He verbalized understanding.

## 2014-09-24 ENCOUNTER — Telehealth: Payer: Self-pay

## 2014-09-24 NOTE — Telephone Encounter (Signed)
Nurse with DR Carloyn Manner states pt cannot take allergy shots while on beta blocker. Please call and advise.

## 2014-09-24 NOTE — Telephone Encounter (Signed)
Please advise 

## 2014-09-26 NOTE — Telephone Encounter (Signed)
i  Am not aware of this issue except as theoretical in the event of hypersensitivity  Reaction.  Perhaps his allergist can call me  It would be fine to give him my cell

## 2014-10-10 NOTE — Telephone Encounter (Signed)
Cell phone number given to MD 09/27/14

## 2014-11-20 ENCOUNTER — Other Ambulatory Visit: Payer: Self-pay

## 2014-11-20 ENCOUNTER — Other Ambulatory Visit (INDEPENDENT_AMBULATORY_CARE_PROVIDER_SITE_OTHER): Payer: Medicare Other

## 2014-11-20 ENCOUNTER — Ambulatory Visit (INDEPENDENT_AMBULATORY_CARE_PROVIDER_SITE_OTHER): Payer: Medicare Other | Admitting: Internal Medicine

## 2014-11-20 ENCOUNTER — Encounter: Payer: Self-pay | Admitting: Internal Medicine

## 2014-11-20 VITALS — BP 130/90 | HR 63 | Ht 67.0 in | Wt 195.2 lb

## 2014-11-20 DIAGNOSIS — I2589 Other forms of chronic ischemic heart disease: Secondary | ICD-10-CM | POA: Diagnosis not present

## 2014-11-20 DIAGNOSIS — I5022 Chronic systolic (congestive) heart failure: Secondary | ICD-10-CM

## 2014-11-20 DIAGNOSIS — I255 Ischemic cardiomyopathy: Secondary | ICD-10-CM | POA: Diagnosis not present

## 2014-11-20 DIAGNOSIS — I4891 Unspecified atrial fibrillation: Secondary | ICD-10-CM | POA: Diagnosis not present

## 2014-11-20 DIAGNOSIS — I10 Essential (primary) hypertension: Secondary | ICD-10-CM

## 2014-11-20 DIAGNOSIS — I459 Conduction disorder, unspecified: Secondary | ICD-10-CM | POA: Diagnosis not present

## 2014-11-20 DIAGNOSIS — Z4502 Encounter for adjustment and management of automatic implantable cardiac defibrillator: Secondary | ICD-10-CM

## 2014-11-20 MED ORDER — BISOPROLOL FUMARATE 5 MG PO TABS: 5.0000 mg | ORAL_TABLET | Freq: Every day | ORAL | Status: DC

## 2014-11-20 NOTE — Progress Notes (Signed)
Patient Care Team: Barnabas Lister, MD as PCP - General (Family Medicine) Rocco Serene, MD (Unknown Physician Specialty)   HPI  [name]. is a 73 y.o. male Seen in followup for ICD implantation initially for syncope in the setting of depressed left ventricular function. He had a 6949-lead. He reached ERI summer 2012 and underwent device explantation with new defibrillator lead insertion with left ventricular lead placement and generator replacement.  He had been having problems with shortness of breath. He was 100% ventricularly paced. He had undergone CPX testing with a VO2 max of 22.4.  He has a history of ischemic cardiomyopathy with prior stenting of an LAD. Catheterization 2012 demonstrated a patent stent and nonobstructive coronary disease. At that the time ejection fraction was 40%; He underwent stenting in [place] around 2007; Plavix was stopped last year  He was noted  to have atrial fibrillation.  His thromboembolic risk profile is notable for TIA, hypertension, age , vascular disease and heart failure. His CHADS-2 score his 4 and his CHADS-VASc score is 6  after some years he finally began NOAC therapy. We started apixaban during the summer.   The patient denies chest pain, shortness of breath, nocturnal dyspnea, orthopnea or peripheral edema. There have been no palpitations, lightheadedness or syncope. Exercise tolerance remains quite good although not great  BP at home 130  mostly  At the last visit, he was having diaphragmatic stimulation. The device was reprogrammed  No longer and issue   Past Medical History  Diagnosis Date  . ischemic cardiomyopathy     stent to a 95%-occluded LAD in 1995; catheterization in 2012 demonstrated patent grafts and nonobstructive disease  . Syncope     recurrent  . Cardiac arrest   . Biventricular defibrillator-Medtronic     Generator replacement 2012 with CRT upgrade  . Severe sinus bradycardia   . High-grade heart  block   . TIA (transient ischemic attack) nov. 2011  . GERD (gastroesophageal reflux disease)   . Allergic rhinitis   . Dyspnea     PFT 12/19/10: FEV1 2.93(107%), FEV1% 72, TLC 8.02(138%), DLCO 110%, +BD  . Sciatic nerve pain   . GERD (gastroesophageal reflux disease)     Past Surgical History  Procedure Laterality Date  . Implantation of medtronic dual-chamber cardioverter    . Tonsillectomy  1948  . Left knee replacement  2006  . Cataract extraction, bilateral    . Prostate biopsy      Current Outpatient Prescriptions  Medication Sig Dispense Refill  . apixaban (ELIQUIS) 5 MG TABS tablet Take 1 tablet (5 mg total) by mouth 2 (two) times daily. 60 tablet 6  . atorvastatin (LIPITOR) 10 MG tablet Take 10 mg by mouth daily.      . bisoprolol (ZEBETA) 5 MG tablet Take 1/2 tablet as directed. 30 tablet 6  . calcium-vitamin D (OSCAL 500/200 D-3) 500-200 MG-UNIT per tablet Take 1 tablet by mouth daily.      [name] dutasteride (AVODART) 0.5 MG capsule Take 0.5 mg by mouth 3 (three) times a week.      [name] lisinopril (PRINIVIL,ZESTRIL) 10 MG tablet Take 1 tablet (10 mg total) by mouth daily. 30 tablet 6  . meloxicam (MOBIC) 15 MG tablet Take 15 mg by mouth daily.    . Multiple Vitamin (MULTIVITAMIN) tablet Take 1 tablet by mouth daily.      . traZODone (DESYREL) 50 MG tablet Take 50 mg by mouth as needed for sleep.  No current facility-administered medications for this visit.    Allergies  Allergen Reactions  . Beta Adrenergic Blockers     Other reaction(s): Other (See Comments) Fatigue  . Oxycodone   . Sulfonamide Derivatives     Review of Systems negative except from HPI and PMH  Physical Exam Ht 5\' 7"  (1.702 m)  Wt 195 lb 4 oz (88.565 kg)  BMI 30.57 kg/m2 Well developed and well nourished in no acute distress HENT normal E scleral and icterus clear Neck Supple JVP flat; carotids brisk and full Clear to ausculation Device pocket well healed; without hematoma or erythema.   There is no tethering r.egular rate and rhythm, no murmurs gallops or rub Soft with active bowel sounds No clubbing cyanosis none Edema Alert and oriented, grossly normal motor and sensory function Skin Warm and Dry  ECG demonstrates AV biventricular pacing   Assessment and  Plan \ Ischemic Cardiomyopathy  Complete heart block  Progressive now device dependent  Implantable defibrillator Medtronic The patient's device was interrogated.  The information was reviewed. No changes were made in the programming.    Atrial fibrillation  Hypertension  HFrEF  Euvolemic continue current meds  Guideline directed medical therapy will continue  Will increase bisoprolol   Would like to check echo toassess LV function and the potential role for aldactone or entresto  Without symptoms of ischemia  Recurrent afib  Continue NOAC

## 2014-11-20 NOTE — Patient Instructions (Signed)
Your physician has recommended you make the following change in your medication:  Increase Bisoprolol to 5 mg once daily   Your physician has requested that you have an echocardiogram. Echocardiography is a painless test that uses sound waves to create images of your heart. It provides your doctor with information about the size and shape of your heart and how well your heart's chambers and valves are working. This procedure takes approximately one hour. There are no restrictions for this procedure.   Your physician wants you to follow-up in: 07/2015 with Dr. [name]. You will receive a reminder letter in the mail two months in advance. If you don't receive a letter, please call our office to schedule the follow-up appointment.  Remote monitoring is used to monitor your Pacemaker of ICD from home. This monitoring reduces the number of office visits required to check your device to one time per year. It allows [place] to keep an eye on the functioning of your device to ensure it is working properly. You are scheduled for a device check from home on 02/18/2014. You may send your transmission at any time that day. If you have a wireless device, the transmission will be sent automatically. After your physician reviews your transmission, you will receive a postcard with your next transmission date.

## 2015-01-15 ENCOUNTER — Ambulatory Visit: Payer: Self-pay | Admitting: Orthopedic Surgery

## 2015-01-25 ENCOUNTER — Encounter: Payer: Self-pay | Admitting: Internal Medicine

## 2015-02-19 ENCOUNTER — Encounter: Payer: Medicare Other | Admitting: *Deleted

## 2015-02-19 ENCOUNTER — Telehealth: Payer: Self-pay | Admitting: *Deleted

## 2015-02-19 ENCOUNTER — Telehealth: Payer: Self-pay | Admitting: Cardiology

## 2015-02-19 NOTE — Telephone Encounter (Signed)
Informed patient that I will forward his message to [place]iddle and call him back

## 2015-02-19 NOTE — Telephone Encounter (Signed)
Pt wife stated that pt has ordered wirex and will have wirex in about 30 days. Pt will transmission at that time.

## 2015-02-19 NOTE — Telephone Encounter (Signed)
Pt is calling stating that his remote transmission that he does from home would not be working for a while, for company is sending a new adaptor for it.  Would like to know what to do in the mean time.  Please advise.  He was to send on in today not sure if it went or not please call patient.

## 2015-02-20 ENCOUNTER — Encounter: Payer: Self-pay | Admitting: Cardiology

## 2015-02-25 ENCOUNTER — Ambulatory Visit (INDEPENDENT_AMBULATORY_CARE_PROVIDER_SITE_OTHER): Payer: Medicare Other | Admitting: *Deleted

## 2015-02-25 DIAGNOSIS — I255 Ischemic cardiomyopathy: Secondary | ICD-10-CM | POA: Diagnosis not present

## 2015-02-25 DIAGNOSIS — I5022 Chronic systolic (congestive) heart failure: Secondary | ICD-10-CM

## 2015-02-25 NOTE — Progress Notes (Signed)
Remote ICD transmission.   

## 2015-02-26 NOTE — Telephone Encounter (Signed)
Informed patient that remote was received on 3-7. Patient voiced understanding.

## 2015-02-27 LAB — MDC_IDC_ENUM_SESS_TYPE_REMOTE
Battery Voltage: 2.9 V
Brady Statistic AP VP Percent: 99.95 %
Brady Statistic AP VS Percent: 0.01 %
Brady Statistic AS VP Percent: 0.04 %
Brady Statistic AS VS Percent: 0 %
Brady Statistic RA Percent Paced: 99.96 %
Date Time Interrogation Session: 20160307164816
HIGH POWER IMPEDANCE MEASURED VALUE: 45 Ohm
HighPow Impedance: 266 Ohm
HighPow Impedance: 54 Ohm
Lead Channel Impedance Value: 342 Ohm
Lead Channel Impedance Value: 532 Ohm
Lead Channel Impedance Value: 779 Ohm
Lead Channel Pacing Threshold Amplitude: 0.875 V
Lead Channel Pacing Threshold Amplitude: 1.875 V
Lead Channel Sensing Intrinsic Amplitude: 1.5 mV
Lead Channel Sensing Intrinsic Amplitude: 1.5 mV
Lead Channel Sensing Intrinsic Amplitude: 5.875 mV
Lead Channel Sensing Intrinsic Amplitude: 5.875 mV
Lead Channel Setting Pacing Amplitude: 1.5 V
Lead Channel Setting Pacing Amplitude: 2 V
Lead Channel Setting Pacing Amplitude: 2 V
MDC IDC MSMT LEADCHNL LV IMPEDANCE VALUE: 437 Ohm
MDC IDC MSMT LEADCHNL LV PACING THRESHOLD PULSEWIDTH: 0.4 ms
MDC IDC MSMT LEADCHNL RA IMPEDANCE VALUE: 380 Ohm
MDC IDC MSMT LEADCHNL RV PACING THRESHOLD PULSEWIDTH: 0.4 ms
MDC IDC SET LEADCHNL LV PACING PULSEWIDTH: 0.8 ms
MDC IDC SET LEADCHNL RV PACING PULSEWIDTH: 0.4 ms
MDC IDC SET LEADCHNL RV SENSING SENSITIVITY: 0.3 mV
MDC IDC SET ZONE DETECTION INTERVAL: 400 ms
MDC IDC STAT BRADY RV PERCENT PACED: 99.99 %
Zone Setting Detection Interval: 300 ms
Zone Setting Detection Interval: 340 ms
Zone Setting Detection Interval: 350 ms

## 2015-03-04 ENCOUNTER — Encounter: Payer: Self-pay | Admitting: Cardiology

## 2015-03-11 ENCOUNTER — Encounter: Payer: Self-pay | Admitting: Internal Medicine

## 2015-04-13 NOTE — Op Note (Signed)
PATIENT NAME:  [name], [name] MR#:  [id number] DATE OF BIRTH:  10/05/1941  DATE OF PROCEDURE:  06/05/2014  PREOPERATIVE DIAGNOSIS:  Senile cataract of the right eye.  POSTOPERATIVE DIAGNOSIS:  Senile cataract of the right eye.  PROCEDURE:  Phacoemulsification with posterior chamber intraocular lens placement of the right eye.   LENS:  ZCT225 12.5-diopter posterior chamber toric intraocular lens with 2.25 diopters of cylindrical power placed at axis 86 degrees.  ULTRASOUND TIME:  16% of 1 minute.  CDE 9.3.   SURGEON:  Mali Brasington, MD  ANESTHESIA:  Topical with tetracaine drops and 2% Xylocaine jelly.  COMPLICATIONS:  None.  DESCRIPTION OF PROCEDURE:  The patient was identified in the holding room and transported to the operating suite and placed in upright position.  The eye was marked so that the 0, 90 and 180 degree axis were clearly marked with purple ink.  Xylocaine gel was placed into the eye and then the eye was prepped and draped in the usual sterile ophthalmic fashion.  The operating microscope was placed into position.   A clear-corneal paracentesis incision was made at the 12 o'clock position.  The anterior chamber was filled with Viscoat.  A 2.4-millimeter near clear corneal incision was then made at the 9 o'clock position.  A cystotome and capsulorrhexis forceps were then used to make a curvilinear capsulorrhexis.  Hydrodissect and hydrodelineation were then performed using balanced salt solution.   Phacoemulsification was then used in stop and chop fashion to remove the lens, nucleus and epinucleus.  The remaining cortex was aspirated using the irrigation and aspiration handpiece.  Provisc viscoelastic was then placed into the capsular bag to distend it for lens placement.  An axis marker was then dipped in purple ink and used to mark the axis position of 86 degrees for placement of the toric intraocular lens.  The 86-degree axis had been previously determined by the Verion  digital marker.   A ZCT225 12.5-diopter lens was then injected into the capsular bag.  It was rotated clockwise until the axis marks on the lens were approximately 15 degrees in the counterclockwise direction to the 86 degree axis mark on the cornea.  The viscoelastic was aspirated from the eye using the irrigation aspiration handpiece.  Then, a Sinskey hook through the side port incision was used to rotate the lens in a clockwise direction until the axis markings of the intraocular lens were lined up with the axis markings on the cornea.  Balanced salt solution was then used to hydrate the wounds.   Miostat was placed into the anterior chamber to constrict the pupil.  0.1 mL of cefuroxime 10 mg/mL were injected into the anterior chamber for a dose of 1 mg of intracameral antibiotic at the completion of the case. The eye was noted to have a physiologic pressure and there was no wound leak noted.  Topical Vigamox drops and erythromycin ointment were applied to the eye.    The patient was taken to the recovery room in stable condition having had no complications of anesthesia or surgery.  ____________________________ Wyonia Hough, MD crb:aw D: 06/05/2014 12:06:07 ET T: 06/05/2014 12:15:45 ET JOB#: [id number]  cc: Wyonia Hough, MD, <Dictator> Leandrew Koyanagi MD ELECTRONICALLY SIGNED 06/06/2014 9:37

## 2015-05-25 ENCOUNTER — Encounter: Payer: Self-pay | Admitting: Nurse Practitioner

## 2015-05-25 NOTE — Progress Notes (Signed)
Electrophysiology Office Note Date: 05/27/2015  ID:  Ronnie Chimes., DOB [birth year], MRN [id number]  PCP: Marcello Fennel, MD Electrophysiologist: [name]  CC: Surgical clearance visit for left TKR  [name]. is a 74 y.o. male is seen today for Dr [name]. He has a history of ischemic cardiomyopathy, CAD, chronic systolic heart failure s/p CRT upgrade with normalization of LV function by echo 11/2014, and paroxysmal atrial fibrillation (on Eliquis).  He is scheduled for left TKR and presents today for surgical clearance. Since last being seen in our clinic, the patient reports doing very well. He denies chest pain, palpitations, dyspnea, PND, orthopnea, nausea, vomiting, dizziness, syncope, edema, weight gain, or early satiety. He has not had ICD shocks.   Cardiac catheterization 2012 demonstrated EF 40-45%, patent LAD stent, normal right sided pressures with normal cardiac output. Echocardiogram 11/2014 demonstrated EF 50-55%, no RWMA, mild AR, LA 38.  He is exercising 35 minutes daily on the elliptical   Device History: MDT dual chamber ICD implanted 2007 for ischemic cardiomyopathy; upgrade to CRTD 2012 with explant of previously implanted 6949 lead and implant of new RV lead. History of appropriate therapy: No History of AAD therapy: No   Past Medical History  Diagnosis Date  . ischemic cardiomyopathy     stent to a 95%-occluded LAD in 1995; catheterization in 2012 demonstrated patent grafts and nonobstructive disease  . Syncope        . Cardiac arrest   . Biventricular defibrillator-Medtronic     Generator replacement 2012 with CRT upgrade  . Severe sinus bradycardia   . High-grade heart block   . TIA (transient ischemic attack) nov. 2011  . GERD (gastroesophageal reflux disease)   . Allergic rhinitis   . Dyspnea     PFT 12/19/10: FEV1 2.93(107%), FEV1% 72, TLC 8.02(138%), DLCO 110%, +BD  . Sciatic nerve pain   . GERD (gastroesophageal reflux disease)     . Hypertension   . Hyperlipidemia   . BPH (benign prostatic hyperplasia)   . ED (erectile dysfunction)   . Asthma    Past Surgical History  Procedure Laterality Date  . Implantation of medtronic dual-chamber cardioverter    . Tonsillectomy  1948  . Left knee replacement  2006  . Cataract extraction, bilateral    . Prostate biopsy    . Coronary angioplasty      Current Outpatient Prescriptions  Medication Sig Dispense Refill  . apixaban (ELIQUIS) 5 MG TABS tablet Take 1 tablet (5 mg total) by mouth 2 (two) times daily. 60 tablet 6  . atorvastatin (LIPITOR) 10 MG tablet Take 10 mg by mouth daily.      . bisoprolol (ZEBETA) 5 MG tablet Take 1 tablet (5 mg total) by mouth daily. 30 tablet 6  . calcium-vitamin D (OSCAL 500/200 D-3) 500-200 MG-UNIT per tablet Take 1 tablet by mouth daily.      [name] Kitchen dutasteride (AVODART) 0.5 MG capsule Take 0.5 mg by mouth 3 (three) times a week.      . latanoprost (XALATAN) 0.005 % ophthalmic solution Place 1 drop into both eyes at bedtime.     [name] Kitchen lisinopril (PRINIVIL,ZESTRIL) 20 MG tablet Take 20 mg by mouth daily.    . meloxicam (MOBIC) 15 MG tablet Take 15 mg by mouth daily.    . Multiple Vitamin (MULTIVITAMIN) tablet Take 1 tablet by mouth daily.      . ranitidine (ZANTAC) 75 MG tablet Take 150 mg by mouth daily.     [name] Kitchen  traZODone (DESYREL) 100 MG tablet Take 100 mg by mouth at bedtime.      No current facility-administered medications for this visit.    Allergies:   Beta adrenergic blockers; Oxycodone; and Sulfonamide derivatives   Social History: History   Social History  . Marital Status: Married    Spouse Name: N/A  . Number of Children: 4  . Years of Education: N/A   Occupational History  . Retired Chief Financial Officer    Social History Main Topics  . Smoking status: Never Smoker   . Smokeless tobacco: Not on file  . Alcohol Use: Yes     Comment: 2 glasses of alcohol per day  . Drug Use: No  . Sexual Activity: Not on file   Other Topics  Concern  . Not on file   Social History Narrative   Regular exercise    Family History: Family History  Problem Relation Age of Onset  . Emphysema Father   . Lung cancer Father   . Cirrhosis Mother     Review of Systems: All other systems reviewed and are otherwise negative except as noted above.   Physical Exam: VS:  BP 106/80 mmHg  Pulse 62  Ht 5\' 7"  (1.702 m)  Wt 191 lb 3.2 oz (86.728 kg)  BMI 29.94 kg/m2 , BMI Body mass index is 29.94 kg/(m^2).  GEN- The patient is well appearing, alert and oriented x 3 today.   HEENT: normocephalic, atraumatic; sclera clear, conjunctiva pink; hearing intact; oropharynx clear; neck supple, no JVP Lymph- no cervical lymphadenopathy Lungs- Clear to ausculation bilaterally, normal work of breathing.  No wheezes, rales, rhonchi Heart- Regular rate and rhythm, no murmurs, rubs or gallops, PMI not laterally displaced GI- soft, non-tender, non-distended, bowel sounds present, no hepatosplenomegaly Extremities- no clubbing, cyanosis, or edema; DP/PT/radial pulses 2+ bilaterally MS- no significant deformity or atrophy Skin- warm and dry, no rash or lesion; ICD pocket well healed Psych- euthymic mood, full affect Neuro- strength and sensation are intact  ICD interrogation- reviewed in detail today,  See PACEART report  EKG:  EKG is ordered today. The ekg ordered today shows AV pacing at 62  Recent Labs: 06/02/2014: ALT 38; BUN 29*; Creatinine 0.97; Hemoglobin 15.0; Platelets 179; Potassium 4.2; Sodium 140   Wt Readings from Last 3 Encounters:  05/27/15 191 lb 3.2 oz (86.728 kg)  11/20/14 195 lb 4 oz (88.565 kg)  08/24/14 189 lb 12 oz (86.07 kg)     Other studies Reviewed: Additional studies/ records that were reviewed today include: Dr Olin Pia notes, last echo  Assessment and Plan:  1.  Ischemic cardiomyopathy/Chronic systolic dysfunction Normalization of EF by echo 11/2014 euvolemic today Stable on an appropriate medical  regimen Normal ICD function See Pace Art report No changes today  2.  Paroxysmal atrial fibrillation Continue Eliquis for CHADS2VASC of at least 4 AF burden by device interrogation today 0% Ok to hold Eliquis for surgery  3.  HTN Stable No change required today  4.  CAD No recent ischemic symptoms Continue medical therapy  5.  Surgical clearance for left TKR The patient is at acceptable cardiac risk for knee replacement.  He has had no recent ischemic symptoms and is exercising 35 minutes daily on the elliptical without chest pain or shortness of breath. Recent echo demonstrated normalization of LV function after CRTD upgrade.  He has had no ventricular arrhythmias.  Discussed with Dr Rayann Heman today.    Current medicines are reviewed at length with the patient today.  The patient does not have concerns regarding his medicines.  The following changes were made today:  none  Labs/ tests ordered today include: none    Disposition:   Follow up with [facility], Dr [name] as scheduled.    Signed, Chanetta Marshall, NP 05/27/2015 11:52 AM  [facility] [address] [place] [address] [phone] (office) [phone] (fax)

## 2015-05-27 ENCOUNTER — Other Ambulatory Visit: Payer: Self-pay | Admitting: Internal Medicine

## 2015-05-27 ENCOUNTER — Encounter: Payer: Self-pay | Admitting: Nurse Practitioner

## 2015-05-27 ENCOUNTER — Ambulatory Visit (INDEPENDENT_AMBULATORY_CARE_PROVIDER_SITE_OTHER): Payer: Medicare Other | Admitting: Nurse Practitioner

## 2015-05-27 ENCOUNTER — Encounter: Payer: Medicare Other | Admitting: *Deleted

## 2015-05-27 VITALS — BP 106/80 | HR 62 | Ht 67.0 in | Wt 191.2 lb

## 2015-05-27 DIAGNOSIS — I5022 Chronic systolic (congestive) heart failure: Secondary | ICD-10-CM | POA: Diagnosis not present

## 2015-05-27 DIAGNOSIS — I10 Essential (primary) hypertension: Secondary | ICD-10-CM

## 2015-05-27 DIAGNOSIS — I442 Atrioventricular block, complete: Secondary | ICD-10-CM | POA: Diagnosis not present

## 2015-05-27 DIAGNOSIS — I255 Ischemic cardiomyopathy: Secondary | ICD-10-CM | POA: Diagnosis not present

## 2015-05-27 DIAGNOSIS — I4891 Unspecified atrial fibrillation: Secondary | ICD-10-CM

## 2015-05-27 DIAGNOSIS — Z01810 Encounter for preprocedural cardiovascular examination: Secondary | ICD-10-CM | POA: Diagnosis not present

## 2015-05-27 LAB — CUP PACEART INCLINIC DEVICE CHECK: MDC IDC SESS DTM: 20160615142750

## 2015-05-27 NOTE — Patient Instructions (Signed)
Remote monitoring is used to monitor your Pacemaker of ICD from home. This monitoring reduces the number of office visits required to check your device to one time per year. It allows [place] to keep an eye on the functioning of your device to ensure it is working properly. You are scheduled for a device check from home on September 6th. You may send your transmission at any time that day. If you have a wireless device, the transmission will be sent automatically. After your physician reviews your transmission, you will receive a postcard with your next transmission date.  Your physician wants you to follow-up in: November with Dr Gari Crown will receive a reminder letter in the mail two months in advance. If you don't receive a letter, please call our office to schedule the follow-up appointment.  You are at acceptable cardiac risk for knee surgery.  Ok to hold Eliquis as directed by orthopedic.

## 2015-05-29 ENCOUNTER — Encounter: Payer: Self-pay | Admitting: Cardiology

## 2015-06-06 ENCOUNTER — Encounter
Admission: RE | Admit: 2015-06-06 | Discharge: 2015-06-06 | Disposition: A | Discharge status: Home / Self Care | Payer: Medicare Other | Source: Ambulatory Visit | Attending: Orthopedic Surgery | Admitting: Orthopedic Surgery

## 2015-06-06 DIAGNOSIS — Z01812 Encounter for preprocedural laboratory examination: Secondary | ICD-10-CM | POA: Insufficient documentation

## 2015-06-06 DIAGNOSIS — E785 Hyperlipidemia, unspecified: Secondary | ICD-10-CM | POA: Diagnosis not present

## 2015-06-06 DIAGNOSIS — I4891 Unspecified atrial fibrillation: Secondary | ICD-10-CM | POA: Insufficient documentation

## 2015-06-06 DIAGNOSIS — I255 Ischemic cardiomyopathy: Secondary | ICD-10-CM | POA: Diagnosis not present

## 2015-06-06 DIAGNOSIS — Z01810 Encounter for preprocedural cardiovascular examination: Secondary | ICD-10-CM | POA: Insufficient documentation

## 2015-06-06 DIAGNOSIS — K219 Gastro-esophageal reflux disease without esophagitis: Secondary | ICD-10-CM | POA: Insufficient documentation

## 2015-06-06 DIAGNOSIS — I5022 Chronic systolic (congestive) heart failure: Secondary | ICD-10-CM | POA: Diagnosis not present

## 2015-06-06 DIAGNOSIS — R001 Bradycardia, unspecified: Secondary | ICD-10-CM | POA: Diagnosis not present

## 2015-06-06 DIAGNOSIS — Z95810 Presence of automatic (implantable) cardiac defibrillator: Secondary | ICD-10-CM | POA: Insufficient documentation

## 2015-06-06 DIAGNOSIS — I442 Atrioventricular block, complete: Secondary | ICD-10-CM | POA: Diagnosis not present

## 2015-06-06 DIAGNOSIS — I10 Essential (primary) hypertension: Secondary | ICD-10-CM | POA: Diagnosis not present

## 2015-06-06 HISTORY — DX: Unspecified osteoarthritis, unspecified site: M19.90

## 2015-06-06 HISTORY — DX: Acute myocardial infarction, unspecified: I21.9

## 2015-06-06 HISTORY — DX: Presence of automatic (implantable) cardiac defibrillator: Z95.810

## 2015-06-06 LAB — SURGICAL PCR SCREEN
MRSA, PCR: NEGATIVE
Staphylococcus aureus: POSITIVE — AB

## 2015-06-06 LAB — BASIC METABOLIC PANEL
Anion gap: 4 — ABNORMAL LOW (ref 5–15)
BUN: 25 mg/dL — AB (ref 6–20)
CHLORIDE: 109 mmol/L (ref 101–111)
CO2: 28 mmol/L (ref 22–32)
Calcium: 9.4 mg/dL (ref 8.9–10.3)
Creatinine, Ser: 0.92 mg/dL (ref 0.61–1.24)
GFR calc Af Amer: >60 mL/min (ref >60)
GFR calc non Af Amer: >60 mL/min (ref >60)
GLUCOSE: 111 mg/dL — AB (ref 65–99)
POTASSIUM: 4.5 mmol/L (ref 3.5–5.1)
Sodium: 141 mmol/L (ref 135–145)

## 2015-06-06 LAB — APTT: aPTT: 27 seconds (ref 24–36)

## 2015-06-06 LAB — CBC
HCT: 42.3 % (ref 40.0–52.0)
Hemoglobin: 14.1 g/dL (ref 13.0–18.0)
MCH: 30.2 pg (ref 26.0–34.0)
MCHC: 33.3 g/dL (ref 32.0–36.0)
MCV: 90.8 fL (ref 80.0–100.0)
Platelets: 140 10*3/uL — ABNORMAL LOW (ref 150–440)
RBC: 4.66 MIL/uL (ref 4.40–5.90)
RDW: 13.1 % (ref 11.5–14.5)
WBC: 5.6 10*3/uL (ref 3.8–10.6)

## 2015-06-06 LAB — URINALYSIS COMPLETE WITH MICROSCOPIC (ARMC ONLY)
Bilirubin Urine: NEGATIVE
GLUCOSE, UA: NEGATIVE mg/dL
Hgb urine dipstick: NEGATIVE
Ketones, ur: NEGATIVE mg/dL
Nitrite: NEGATIVE
Protein, ur: NEGATIVE mg/dL
Specific Gravity, Urine: 1.026 (ref 1.005–1.030)
Squamous Epithelial / LPF: NONE SEEN
pH: 5 (ref 5.0–8.0)

## 2015-06-06 LAB — TYPE AND SCREEN
ABO/RH(D): B POS
ANTIBODY SCREEN: NEGATIVE

## 2015-06-06 LAB — ABO/RH: ABO/RH(D): B POS

## 2015-06-06 LAB — PROTIME-INR
INR: 0.97
Prothrombin Time: 13.1 seconds (ref 11.4–15.0)

## 2015-06-06 LAB — SEDIMENTATION RATE: Sed Rate: 4 mm/hr (ref 0–20)

## 2015-06-06 NOTE — Pre-Procedure Instructions (Signed)
Dr. Rudene Christians office, [facility], surgery scheduler, notified of positive staph aureus results from nasal swab.

## 2015-06-06 NOTE — Patient Instructions (Signed)
  Your procedure is scheduled on: June 20, 2015 (Thursday) Report to Day Surgery. To find out your arrival time please call [phone] between 1PM - 3PM on June 19, 2015 (Wednesday)  Remember: Instructions that are not followed completely may result in serious medical risk, up to and including death, or upon the discretion of your surgeon and anesthesiologist your surgery may need to be rescheduled.    __x__ 1. Do not eat food or drink liquids after midnight. No gum chewing or hard candies.     __x__ 2. No Alcohol for 24 hours before or after surgery.   ____ 3. Bring all medications with you on the day of surgery if instructed.    __x__ 4. Notify your doctor if there is any change in your medical condition     (cold, fever, infections).     Do not wear jewelry, make-up, hairpins, clips or nail polish.  Do not wear lotions, powders, or perfumes. You may wear deodorant.  Do not shave 48 hours prior to surgery. Men may shave face and neck.  Do not bring valuables to the hospital.    [facility] is not responsible for any belongings or valuables.               Contacts, dentures or bridgework may not be worn into surgery.  Leave your suitcase in the car. After surgery it may be brought to your room.  For patients admitted to the hospital, discharge time is determined by your                treatment team.   Patients discharged the day of surgery will not be allowed to drive home.   Please read over the following fact sheets that you were given:   MRSA Information and Surgical Site Infection Prevention   __x__ Take these medicines the morning of surgery with A SIP OF WATER:    1.Bisoprolol  2.Lisinopril   3.Lipitor   4.Ranitidine  5.  6.  ____ Fleet Enema (as directed)   __x__ Use CHG Soap as directed  ____ Use inhalers on the day of surgery  ____ Stop metformin 2 days prior to surgery    ____ Take 1/2 of usual insulin dose the night before surgery and none on the morning  of surgery.   __x__ Stop Coumadin/Plavix/aspirin on (ask Dr. Rudene Christians about stopping Eliquis today at office appointment) __x__ Stop Anti-inflammatories on(stop Meloxicam one week before surgery)   ____ Stop supplements until after surgery.    ____ Bring C-Pap to the hospital.

## 2015-06-20 ENCOUNTER — Inpatient Hospital Stay: Payer: Medicare Other | Admitting: Anesthesiology

## 2015-06-20 ENCOUNTER — Inpatient Hospital Stay
Admission: RE | Admit: 2015-06-20 | Discharge: 2015-06-23 | DRG: 467 | Disposition: A | Discharge status: Home / Self Care | Payer: Medicare Other | Source: Ambulatory Visit | Attending: Orthopedic Surgery | Admitting: Orthopedic Surgery

## 2015-06-20 ENCOUNTER — Ambulatory Visit: Admit: 2015-06-20 | Payer: Self-pay | Admitting: Orthopedic Surgery

## 2015-06-20 ENCOUNTER — Encounter
Admission: RE | Disposition: A | Discharge status: Home / Self Care | Payer: Self-pay | Source: Ambulatory Visit | Attending: Orthopedic Surgery

## 2015-06-20 ENCOUNTER — Encounter: Payer: Self-pay | Admitting: *Deleted

## 2015-06-20 ENCOUNTER — Inpatient Hospital Stay: Payer: Medicare Other

## 2015-06-20 DIAGNOSIS — T849XXA Unspecified complication of internal orthopedic prosthetic device, implant and graft, initial encounter: Principal | ICD-10-CM | POA: Diagnosis present

## 2015-06-20 DIAGNOSIS — Z8673 Personal history of transient ischemic attack (TIA), and cerebral infarction without residual deficits: Secondary | ICD-10-CM

## 2015-06-20 DIAGNOSIS — I252 Old myocardial infarction: Secondary | ICD-10-CM | POA: Diagnosis not present

## 2015-06-20 DIAGNOSIS — Z7901 Long term (current) use of anticoagulants: Secondary | ICD-10-CM

## 2015-06-20 DIAGNOSIS — K219 Gastro-esophageal reflux disease without esophagitis: Secondary | ICD-10-CM | POA: Diagnosis present

## 2015-06-20 DIAGNOSIS — D62 Acute posthemorrhagic anemia: Secondary | ICD-10-CM | POA: Diagnosis not present

## 2015-06-20 DIAGNOSIS — I10 Essential (primary) hypertension: Secondary | ICD-10-CM | POA: Diagnosis present

## 2015-06-20 DIAGNOSIS — Y798 Miscellaneous orthopedic devices associated with adverse incidents, not elsewhere classified: Secondary | ICD-10-CM | POA: Diagnosis present

## 2015-06-20 DIAGNOSIS — E785 Hyperlipidemia, unspecified: Secondary | ICD-10-CM | POA: Diagnosis present

## 2015-06-20 DIAGNOSIS — J45909 Unspecified asthma, uncomplicated: Secondary | ICD-10-CM | POA: Diagnosis present

## 2015-06-20 DIAGNOSIS — Z882 Allergy status to sulfonamides status: Secondary | ICD-10-CM

## 2015-06-20 DIAGNOSIS — Z96659 Presence of unspecified artificial knee joint: Secondary | ICD-10-CM

## 2015-06-20 DIAGNOSIS — G8918 Other acute postprocedural pain: Secondary | ICD-10-CM

## 2015-06-20 DIAGNOSIS — T84018A Broken internal joint prosthesis, other site, initial encounter: Secondary | ICD-10-CM

## 2015-06-20 HISTORY — PX: TOTAL KNEE REVISION: SHX996

## 2015-06-20 LAB — CREATININE, SERUM
CREATININE: 0.98 mg/dL (ref 0.61–1.24)
GFR calc Af Amer: >60 mL/min (ref >60)
GFR calc non Af Amer: >60 mL/min (ref >60)

## 2015-06-20 LAB — CBC
HEMATOCRIT: 39.4 % — AB (ref 40.0–52.0)
HEMOGLOBIN: 13.2 g/dL (ref 13.0–18.0)
MCH: 30.5 pg (ref 26.0–34.0)
MCHC: 33.4 g/dL (ref 32.0–36.0)
MCV: 91.3 fL (ref 80.0–100.0)
PLATELETS: 122 10*3/uL — AB (ref 150–440)
RBC: 4.32 MIL/uL — ABNORMAL LOW (ref 4.40–5.90)
RDW: 13.2 % (ref 11.5–14.5)
WBC: 10.4 10*3/uL (ref 3.8–10.6)

## 2015-06-20 SURGERY — TOTAL KNEE REVISION
Anesthesia: Spinal | Laterality: Left | Wound class: Clean

## 2015-06-20 SURGERY — TOTAL KNEE REVISION
Anesthesia: Choice | Laterality: Left

## 2015-06-20 MED ORDER — CEFAZOLIN SODIUM-DEXTROSE 2-3 GM-% IV SOLR: 2.0000 g | Freq: Once | INTRAVENOUS | Status: DC

## 2015-06-20 MED ORDER — TRAZODONE HCL 100 MG PO TABS
100.0000 mg | ORAL_TABLET | Freq: Every day | ORAL | Status: DC
  Administered 2015-06-20 – 2015-06-22 (×3): 100 mg via ORAL
  Filled 2015-06-20 (×3): qty 1

## 2015-06-20 MED ORDER — ONDANSETRON HCL 4 MG PO TABS: 4.0000 mg | ORAL_TABLET | Freq: Four times a day (QID) | ORAL | Status: DC | PRN

## 2015-06-20 MED ORDER — MELOXICAM 7.5 MG PO TABS
15.0000 mg | ORAL_TABLET | Freq: Every day | ORAL | Status: DC
  Administered 2015-06-21 – 2015-06-23 (×3): 15 mg via ORAL
  Filled 2015-06-20 (×3): qty 2

## 2015-06-20 MED ORDER — MIDAZOLAM HCL 5 MG/5ML IJ SOLN
INTRAMUSCULAR | Status: DC | PRN
  Administered 2015-06-20: 2 mg via INTRAVENOUS

## 2015-06-20 MED ORDER — CEFAZOLIN SODIUM-DEXTROSE 2-3 GM-% IV SOLR
2.0000 g | Freq: Three times a day (TID) | INTRAVENOUS | Status: AC
  Administered 2015-06-20 – 2015-06-22 (×6): 2 g via INTRAVENOUS
  Filled 2015-06-20 (×6): qty 50

## 2015-06-20 MED ORDER — VASOPRESSIN 20 UNIT/ML IV SOLN
INTRAVENOUS | Status: DC | PRN
  Administered 2015-06-20 (×4): 1 [IU] via INTRAVENOUS

## 2015-06-20 MED ORDER — TAPENTADOL HCL 50 MG PO TABS
100.0000 mg | ORAL_TABLET | ORAL | Status: DC | PRN
  Administered 2015-06-20 – 2015-06-21 (×3): 100 mg via ORAL
  Filled 2015-06-20 (×3): qty 2

## 2015-06-20 MED ORDER — MAGNESIUM HYDROXIDE 400 MG/5ML PO SUSP
30.0000 mL | Freq: Every day | ORAL | Status: DC | PRN
  Administered 2015-06-22: 30 mL via ORAL
  Filled 2015-06-20: qty 30

## 2015-06-20 MED ORDER — SODIUM CHLORIDE 0.9 % IV SOLN
INTRAVENOUS | Status: DC | PRN
  Administered 2015-06-20: 60 mL

## 2015-06-20 MED ORDER — APIXABAN 5 MG PO TABS
5.0000 mg | ORAL_TABLET | Freq: Two times a day (BID) | ORAL | Status: DC
  Administered 2015-06-20 – 2015-06-23 (×5): 5 mg via ORAL
  Filled 2015-06-20 (×6): qty 1

## 2015-06-20 MED ORDER — ONDANSETRON HCL 4 MG/2ML IJ SOLN
4.0000 mg | Freq: Once | INTRAMUSCULAR | Status: DC | PRN
Start: 2015-06-20 — End: 2015-06-20

## 2015-06-20 MED ORDER — CALCIUM CARBONATE-VITAMIN D 500-200 MG-UNIT PO TABS
1.0000 | ORAL_TABLET | Freq: Every day | ORAL | Status: DC
  Administered 2015-06-21 – 2015-06-23 (×3): 1 via ORAL
  Filled 2015-06-20 (×3): qty 1

## 2015-06-20 MED ORDER — MENTHOL 3 MG MT LOZG: 1.0000 | LOZENGE | OROMUCOSAL | Status: DC | PRN

## 2015-06-20 MED ORDER — FENTANYL CITRATE (PF) 100 MCG/2ML IJ SOLN
25.0000 ug | INTRAMUSCULAR | Status: DC | PRN
  Administered 2015-06-20 (×4): 25 ug via INTRAVENOUS

## 2015-06-20 MED ORDER — FAMOTIDINE 20 MG PO TABS
10.0000 mg | ORAL_TABLET | Freq: Two times a day (BID) | ORAL | Status: DC
  Administered 2015-06-20 – 2015-06-23 (×6): 10 mg via ORAL
  Filled 2015-06-20 (×6): qty 1

## 2015-06-20 MED ORDER — PHENOL 1.4 % MT LIQD: 1.0000 | OROMUCOSAL | Status: DC | PRN

## 2015-06-20 MED ORDER — ATORVASTATIN CALCIUM 10 MG PO TABS
10.0000 mg | ORAL_TABLET | Freq: Every day | ORAL | Status: DC
  Administered 2015-06-21 – 2015-06-23 (×3): 10 mg via ORAL
  Filled 2015-06-20 (×3): qty 1

## 2015-06-20 MED ORDER — ACETAMINOPHEN 10 MG/ML IV SOLN
INTRAVENOUS | Status: AC
  Filled 2015-06-20: qty 100

## 2015-06-20 MED ORDER — ONDANSETRON HCL 4 MG/2ML IJ SOLN
INTRAMUSCULAR | Status: DC | PRN
  Administered 2015-06-20: 4 mg via INTRAVENOUS

## 2015-06-20 MED ORDER — LACTATED RINGERS IV SOLN
INTRAVENOUS | Status: DC
  Administered 2015-06-20 (×4): via INTRAVENOUS

## 2015-06-20 MED ORDER — FENTANYL CITRATE (PF) 100 MCG/2ML IJ SOLN
INTRAMUSCULAR | Status: AC
  Filled 2015-06-20: qty 2

## 2015-06-20 MED ORDER — ONDANSETRON HCL 4 MG/2ML IJ SOLN: 4.0000 mg | Freq: Four times a day (QID) | INTRAMUSCULAR | Status: DC | PRN

## 2015-06-20 MED ORDER — BISACODYL 10 MG RE SUPP
10.0000 mg | Freq: Every day | RECTAL | Status: DC | PRN
Start: 2015-06-20 — End: 2015-06-23
  Administered 2015-06-23: 10 mg via RECTAL
  Filled 2015-06-20: qty 1

## 2015-06-20 MED ORDER — MORPHINE SULFATE 2 MG/ML IJ SOLN: 2.0000 mg | INTRAMUSCULAR | Status: DC | PRN

## 2015-06-20 MED ORDER — LATANOPROST 0.005 % OP SOLN
1.0000 [drp] | Freq: Every day | OPHTHALMIC | Status: DC
  Administered 2015-06-20 – 2015-06-22 (×3): 1 [drp] via OPHTHALMIC
  Filled 2015-06-20: qty 2.5

## 2015-06-20 MED ORDER — PROPOFOL INFUSION 10 MG/ML OPTIME
INTRAVENOUS | Status: DC | PRN
  Administered 2015-06-20: 75 ug/kg/min via INTRAVENOUS

## 2015-06-20 MED ORDER — ACETAMINOPHEN 650 MG RE SUPP: 650.0000 mg | Freq: Four times a day (QID) | RECTAL | Status: DC | PRN

## 2015-06-20 MED ORDER — ALUM & MAG HYDROXIDE-SIMETH 200-200-20 MG/5ML PO SUSP: 30.0000 mL | ORAL | Status: DC | PRN

## 2015-06-20 MED ORDER — GENTAMICIN SULFATE 40 MG/ML IJ SOLN
INTRAMUSCULAR | Status: DC | PRN
  Administered 2015-06-20: 320 mg

## 2015-06-20 MED ORDER — BISOPROLOL FUMARATE 5 MG PO TABS
5.0000 mg | ORAL_TABLET | Freq: Every day | ORAL | Status: DC
  Administered 2015-06-21 – 2015-06-23 (×3): 5 mg via ORAL
  Filled 2015-06-20 (×4): qty 1

## 2015-06-20 MED ORDER — DUTASTERIDE 0.5 MG PO CAPS
0.5000 mg | ORAL_CAPSULE | ORAL | Status: DC
  Administered 2015-06-21: 0.5 mg via ORAL
  Filled 2015-06-20 (×2): qty 1

## 2015-06-20 MED ORDER — ACETAMINOPHEN 325 MG PO TABS
650.0000 mg | ORAL_TABLET | Freq: Four times a day (QID) | ORAL | Status: DC | PRN
  Administered 2015-06-20: 650 mg via ORAL
  Filled 2015-06-20: qty 2

## 2015-06-20 MED ORDER — ACETAMINOPHEN 10 MG/ML IV SOLN
INTRAVENOUS | Status: DC | PRN
  Administered 2015-06-20: 1000 mg via INTRAVENOUS

## 2015-06-20 MED ORDER — DIPHENHYDRAMINE HCL 12.5 MG/5ML PO ELIX: 12.5000 mg | ORAL_SOLUTION | ORAL | Status: DC | PRN

## 2015-06-20 MED ORDER — METHOCARBAMOL 500 MG PO TABS
500.0000 mg | ORAL_TABLET | Freq: Four times a day (QID) | ORAL | Status: DC | PRN
  Administered 2015-06-20 (×2): 500 mg via ORAL
  Filled 2015-06-20 (×2): qty 1

## 2015-06-20 MED ORDER — ADULT MULTIVITAMIN W/MINERALS CH
1.0000 | ORAL_TABLET | Freq: Every day | ORAL | Status: DC
  Administered 2015-06-20 – 2015-06-23 (×4): 1 via ORAL
  Filled 2015-06-20 (×4): qty 1

## 2015-06-20 MED ORDER — LISINOPRIL 20 MG PO TABS
20.0000 mg | ORAL_TABLET | Freq: Every day | ORAL | Status: DC
  Administered 2015-06-21 – 2015-06-23 (×3): 20 mg via ORAL
  Filled 2015-06-20 (×3): qty 1

## 2015-06-20 MED ORDER — DOCUSATE SODIUM 100 MG PO CAPS
100.0000 mg | ORAL_CAPSULE | Freq: Two times a day (BID) | ORAL | Status: DC
  Administered 2015-06-20 – 2015-06-23 (×7): 100 mg via ORAL
  Filled 2015-06-20 (×7): qty 1

## 2015-06-20 MED ORDER — PROPOFOL 10 MG/ML IV BOLUS
INTRAVENOUS | Status: DC | PRN
  Administered 2015-06-20: 30 mg via INTRAVENOUS

## 2015-06-20 MED ORDER — ONE-A-DAY MENS PO TABS
1.0000 | ORAL_TABLET | Freq: Every day | ORAL | Status: DC
  Filled 2015-06-20: qty 1

## 2015-06-20 MED ORDER — MAGNESIUM CITRATE PO SOLN
1.0000 | Freq: Once | ORAL | Status: AC | PRN
  Filled 2015-06-20: qty 296

## 2015-06-20 MED ORDER — PHENYLEPHRINE HCL 10 MG/ML IJ SOLN
INTRAMUSCULAR | Status: DC | PRN
  Administered 2015-06-20 (×2): 100 ug via INTRAVENOUS
  Administered 2015-06-20 (×2): 200 ug via INTRAVENOUS
  Administered 2015-06-20 (×4): 100 ug via INTRAVENOUS

## 2015-06-20 MED ORDER — TRANEXAMIC ACID 1000 MG/10ML IV SOLN
3000.0000 mg | INTRAVENOUS | Status: DC | PRN
  Administered 2015-06-20: 3000 mg via TOPICAL

## 2015-06-20 MED ORDER — SODIUM CHLORIDE 0.9 % IV SOLN
INTRAVENOUS | Status: DC
  Administered 2015-06-20 – 2015-06-21 (×2): via INTRAVENOUS

## 2015-06-20 MED ORDER — ASPIRIN 81 MG PO CHEW
81.0000 mg | CHEWABLE_TABLET | Freq: Every day | ORAL | Status: DC
  Administered 2015-06-21 – 2015-06-23 (×3): 81 mg via ORAL
  Filled 2015-06-20 (×3): qty 1

## 2015-06-20 MED ORDER — METHOCARBAMOL 1000 MG/10ML IJ SOLN: 500.0000 mg | Freq: Four times a day (QID) | INTRAVENOUS | Status: DC | PRN

## 2015-06-20 MED ORDER — ZOLPIDEM TARTRATE 5 MG PO TABS: 5.0000 mg | ORAL_TABLET | Freq: Every evening | ORAL | Status: DC | PRN

## 2015-06-20 SURGICAL SUPPLY — 86 items
ADAPTER BOLT FEMORAL +2/-2 (Knees) ×1 IMPLANT
ADPR FEM +2/-2 OFST BOLT (Knees) ×1 IMPLANT
ADPR FEM 5D STRL KN PFC SGM (Orthopedic Implant) ×1 IMPLANT
AUG FEM 4 4 CMB LF KN POST (Knees) ×1 IMPLANT
AUG FEM SZ4 4 STRL LF KN LT TI (Knees) ×2 IMPLANT
AUG FEM SZ4 8 CMB POST STRL LF (Orthopedic Implant) ×1 IMPLANT
AUGMENT FEM SZ4 4 LT DIST PFC (Knees) ×2 IMPLANT
AUGMENT FMRL PST PFC SIG SZ4 8 (Orthopedic Implant) IMPLANT
AUGMENT POSTERIOR PFC SZ4 4MM (Knees) IMPLANT
BANDAGE ELASTIC 6 CLIP ST LF (GAUZE/BANDAGES/DRESSINGS) ×2 IMPLANT
BLADE SAW 1 (BLADE) ×2 IMPLANT
BLADE SAW 1/2 (BLADE) ×1 IMPLANT
BONE CEMENT GENTAMICIN (Cement) ×6 IMPLANT
BOWL CEMENT MIX W SPATULA BONE (MISCELLANEOUS) ×1 IMPLANT
CANISTER SUCT 1200ML W/VALVE (MISCELLANEOUS) ×2 IMPLANT
CANISTER SUCT 3000ML (MISCELLANEOUS) ×4 IMPLANT
CATH FOL LEG HOLDER (MISCELLANEOUS) ×2 IMPLANT
CATH TRAY 16F METER LATEX (MISCELLANEOUS) ×2 IMPLANT
CELL SAVER ADDITIONAL TIME PER (MISCELLANEOUS) ×210
CELL SAVER AUTOCOAGULATE (MISCELLANEOUS) ×2
CELL SAVER COLL SVCS (MISCELLANEOUS) ×2
CELL SAVER FILTER LIPID PALL S (MISCELLANEOUS) ×4
CEMENT BONE GENTAMICIN 40 (Cement) IMPLANT
CHLORAPREP W/TINT 26ML (MISCELLANEOUS) ×2 IMPLANT
COMP FEM CEM LT SZ4 (Orthopedic Implant) ×2 IMPLANT
COMPONENT FEM CEM LT SZ4 (Orthopedic Implant) IMPLANT
COOLER POLAR GLACIER W/PUMP (MISCELLANEOUS) ×2 IMPLANT
COVER TABLE BACK 60X90 (DRAPES) ×1 IMPLANT
DRAPE SHEET LG 3/4 BI-LAMINATE (DRAPES) ×4 IMPLANT
ELECT CAUTERY BLADE 6.4 (BLADE) ×3 IMPLANT
FEM POST AUG PFC SIGMA SZ4 8MM (Orthopedic Implant) ×2 IMPLANT
FEMORAL ADAPTER (Orthopedic Implant) ×1 IMPLANT
FILTER LIPID PALL S CELL SAVER (MISCELLANEOUS) IMPLANT
GAUZE PETRO XEROFOAM 1X8 (MISCELLANEOUS) ×2 IMPLANT
GAUZE SPONGE 4X4 12PLY STRL (GAUZE/BANDAGES/DRESSINGS) ×2 IMPLANT
GLOVE BIOGEL PI IND STRL 8 (GLOVE) IMPLANT
GLOVE BIOGEL PI IND STRL 9 (GLOVE) ×2 IMPLANT
GLOVE BIOGEL PI INDICATOR 8 (GLOVE) ×1
GLOVE BIOGEL PI INDICATOR 9 (GLOVE) ×2
GLOVE SURG ORTHO 8.0 STRL STRW (GLOVE) ×1 IMPLANT
GLOVE SURG ORTHO 9.0 STRL STRW (GLOVE) ×2 IMPLANT
GOWN SPECIALTY ULTRA XL (MISCELLANEOUS) ×2 IMPLANT
GOWN STRL REUS W/ TWL LRG LVL3 (GOWN DISPOSABLE) ×2 IMPLANT
GOWN STRL REUS W/TWL LRG LVL3 (GOWN DISPOSABLE) ×4
HANDPIECE SUCTION TUBG SURGILV (MISCELLANEOUS) ×2 IMPLANT
HOOD PEEL AWAY FACE SHEILD DIS (HOOD) ×2 IMPLANT
IMMBOLIZER KNEE 19 BLUE UNIV (SOFTGOODS) ×2 IMPLANT
INSERT TIBIAL RP TCS SZ 4.0 (Knees) ×1 IMPLANT
KIT RM TURNOVER STRD PROC AR (KITS) ×2 IMPLANT
KNIFE SCULPS 14X20 (INSTRUMENTS) ×2 IMPLANT
NDL SPNL 18GX3.5 QUINCKE PK (NEEDLE) ×1 IMPLANT
NDL SPNL 20GX3.5 QUINCKE YW (NEEDLE) ×1 IMPLANT
NEEDLE SPNL 18GX3.5 QUINCKE PK (NEEDLE) ×2 IMPLANT
NEEDLE SPNL 20GX3.5 QUINCKE YW (NEEDLE) ×2 IMPLANT
NS IRRIG 1000ML POUR BTL (IV SOLUTION) ×2 IMPLANT
PACK TOTAL KNEE (MISCELLANEOUS) ×2 IMPLANT
PAD GROUND ADULT SPLIT (MISCELLANEOUS) ×2 IMPLANT
PAD WRAPON POLAR KNEE (MISCELLANEOUS) ×1 IMPLANT
PATELLA DOME PFC 38MM (Knees) ×1 IMPLANT
PENCIL ELECTRO HAND CTR (MISCELLANEOUS) ×1 IMPLANT
POSTERIOR AUGMENT PFC SZ4 4MM (Knees) ×2 IMPLANT
SAVE CELL AUTOCOAG (MISCELLANEOUS) IMPLANT
SAVER CELL COLL SVCS (MISCELLANEOUS) IMPLANT
SLEEVE MBT PROUS ML 29MM (Knees) ×1 IMPLANT
SLEEVE UNIV FEM DIST PRO SZ 31 (Sleeve) ×1 IMPLANT
SOL .9 NS 3000ML IRR  AL (IV SOLUTION) ×1
SOL .9 NS 3000ML IRR AL (IV SOLUTION) ×1
SOL .9 NS 3000ML IRR UROMATIC (IV SOLUTION) ×1 IMPLANT
SPONGE LAP 18X18 5 PK (GAUZE/BANDAGES/DRESSINGS) ×2 IMPLANT
STAPLER SKIN PROX 35W (STAPLE) ×2 IMPLANT
STEM UNIVERSAL 115X12MM (Stem) ×1 IMPLANT
STEM UNIVERSAL REVISION 75X10 (Stem) ×1 IMPLANT
SUCTION FRAZIER TIP 10 FR DISP (SUCTIONS) ×2 IMPLANT
SUT DVC 2 QUILL PDO  T11 36X36 (SUTURE) ×1
SUT DVC 2 QUILL PDO T11 36X36 (SUTURE) ×1 IMPLANT
SUT DVC QUILL MONODERM 30X30 (SUTURE) ×2 IMPLANT
SUT TICRON 2-0 30IN 311381 (SUTURE) ×4 IMPLANT
SWAB CULTURE AMIES ANAERIB BLU (MISCELLANEOUS) ×2 IMPLANT
SYR 20CC LL (SYRINGE) ×4 IMPLANT
SYR 50ML LL SCALE MARK (SYRINGE) ×3 IMPLANT
TIME ADDITIONAL PER CELL SAVER (MISCELLANEOUS) IMPLANT
TOWEL OR 17X26 4PK STRL BLUE (TOWEL DISPOSABLE) ×2 IMPLANT
TOWER CARTRIDGE SMART MIX (DISPOSABLE) ×2 IMPLANT
TRAY REVISION SZ 4 (Knees) ×1 IMPLANT
WATER STERILE IRR 1000ML POUR (IV SOLUTION) ×2 IMPLANT
WRAPON POLAR PAD KNEE (MISCELLANEOUS) ×2

## 2015-06-20 NOTE — Transfer of Care (Signed)
Immediate Anesthesia Transfer of Care Note  Patient: [name].  Procedure(s) Performed: Procedure(s): TOTAL KNEE REVISION (Left)  Patient Location: PACU  Anesthesia Type:Spinal  Level of Consciousness: sedated  Airway & Oxygen Therapy: Patient Spontanous Breathing and Patient connected to face mask oxygen  Post-op Assessment: Report given to RN  Post vital signs: Reviewed and stable  Last Vitals:  Filed Vitals:   06/20/15 0613  BP: 113/62  Pulse: 74  Temp: 36.6 C  Resp: 14    Complications: No apparent anesthesia complications

## 2015-06-20 NOTE — Anesthesia Procedure Notes (Signed)
Spinal Patient location during procedure: OR Staffing Anesthesiologist: Gunnar Bulla Performed by: anesthesiologist  Preanesthetic Checklist Completed: patient identified, site marked, surgical consent, pre-op evaluation, timeout performed, IV checked and risks and benefits discussed Spinal Block Patient position: sitting Prep: Betadine Patient monitoring: heart rate, cardiac monitor, continuous pulse ox and blood pressure Approach: midline Location: L3-4 Injection technique: single-shot Needle Needle type: Pencil-Tip  Needle gauge: 25 G Needle length: 9 cm Assessment Sensory level: T10 Additional Notes 12.5mg  of marcaine and 8 mg of tetracaine.

## 2015-06-20 NOTE — Anesthesia Preprocedure Evaluation (Addendum)
Anesthesia Evaluation  Patient identified by MRN, date of birth, ID band Patient awake    Reviewed: Allergy & Precautions, NPO status , Patient's Chart, lab work & pertinent test results, reviewed documented beta blocker date and time   Airway Mallampati: II  TM Distance: >3 FB     Dental  (+) Chipped   Pulmonary shortness of breath, asthma ,          Cardiovascular hypertension, + CAD and + Past MI + dysrhythmias Atrial Fibrillation + Cardiac Defibrillator     Neuro/Psych TIA Neuromuscular disease    GI/Hepatic GERD-  Controlled,  Endo/Other    Renal/GU      Musculoskeletal  (+) Arthritis -, Osteoarthritis,    Abdominal   Peds  Hematology   Anesthesia Other Findings Cardiac stent.   Reproductive/Obstetrics                            Anesthesia Physical Anesthesia Plan  ASA: III  Anesthesia Plan: Spinal   Post-op Pain Management:    Induction:   Airway Management Planned: Nasal Cannula  Additional Equipment:   Intra-op Plan:   Post-operative Plan:   Informed Consent: I have reviewed the patients History and Physical, chart, labs and discussed the procedure including the risks, benefits and alternatives for the proposed anesthesia with the patient or authorized representative who has indicated his/her understanding and acceptance.     Plan Discussed with: CRNA  Anesthesia Plan Comments:         Anesthesia Quick Evaluation

## 2015-06-20 NOTE — Op Note (Signed)
06/20/2015  12:17 PM  PATIENT:  [name].  74 y.o. male  PRE-OPERATIVE DIAGNOSIS:  FAILURE OF TOTAL KNEE ARTHROPLASTY, knee effusion  POST-OPERATIVE DIAGNOSIS:  FAILURE OF TOTAL KNEE ARTHROPLASTY, knee effusion  PROCEDURE:  Procedure(s): TOTAL KNEE REVISION (Left)  SURGEON: Laurene Footman, MD  ASSISTANTS: Rachelle H[facility]  ANESTHESIA:   spinal  EBL:  Total I/O In: 2875 [I.V.:2500; Blood:375] Out: 69 [Blood:700]  BLOOD ADMINISTERED:375 CC CELLSAVER  DRAINS: none   LOCAL MEDICATIONS USED:  OTHER Exparel  SPECIMEN:  Source of Specimen:  Joint fluid culture and tissue from joint that appeared to be poly-wear debris  DISPOSITION OF SPECIMEN:  PATHOLOGY  COUNTS:  YES  TOURNIQUET:   69 minutes at 300 mmHg  IMPLANTS: Depuy revision knee set  TC 3 size 450 mm insert and MBT revision sleeve 29 mm with a size 4 MBT tibial tray, PFC Sigma femoral adapter 05 with +2 offset TC 3 for left Sigma femoral component with 29 mm sleeve 75 x 10 mm tibial sleeve articular tibial stem 1 15 x 12 femoral stem distal augments 4 mm medial and lateral posterior lateral 8 mm posterior augment and 4 mm medial posterior augment components were cemented with a 38 mm oval dome patella cemented    DICTATION: .Dragon Dictation patient brought the operating room and after adequate spinal anesthesia was obtained the left leg was prepped and draped in sterile fashion proximally 15 flexion contracture flexion to 100*the case after prepping draping in sterile fashion appropriate patient identification timeout procedures were completed. A midline skin incision was made using the prior incision with slight extension proximally. Medial capsulotomy was performed and initial inspection revealed clear fluid within the joint with extensive amounts of granular white granular tissue consistent with polyethylene debris culture was obtained of the joint fluid as well as pathologic specimen for analysis of the  debris. The medial lateral gutters were opened up and extensive debris was removed. The tibial insert was removed and it showed excessive wear with significant poly-wear present. The femoral component was removed after care is taken to preserve as much bone as possible, however there is extensive ostial lysis on both medial and lateral femoral condyles and the bone was quite digital. The tibial component was removed and there was also extensive ostial lysis present underlying the baseplate tibial preparation was carried out opening the canal with reamer and then hand reaming using a trial tibial baseplate and proximal reaming for the conical reamer broaching and then placing the metaphyseal sleeve on the trial gave appropriate fill and fit. Next femurs broaches similar fashion with hand and reaming the canal there was a significant defect anteriorly because of poor previous extension orientation of the component and care was taken not to break through this anterior thin cortex broach was carried out with a conical reamer approach and then distal femoral cuts followed by 4-in-1 cut and then box cut the appropriate size femur was obtained and 4 mm distal augments try to Prevent reason the joint line. The final component these components were chosen and inserted because the fragility the femoral bone was done before trialing for the polyethylene insert the tourniquet was raised and the bony surfaces thoroughly irrigated with a Betadine solution and dried the tibial and femoral components were cemented on the undersurface. Excess cement removed with a 15 mm insert trial to apply pressure to the joint and this gave good stability was socially chosen as the final component after the components were  set the patella was examined and noted also be loose was removed there is no underlying ostial lysis but the component was loose 3 drill holes were made with a 38. Fitting best and 38 mm dome patella then cemented in place the  knee was again thoroughly irrigated and the 15 mm insert final insert placed with excellent stability as well as with full extension obtained. TXA was applied topically within the joint the wound closed with heavy Quill to a Quill substantially and skin staples Xeroform 4 x 4's ABDs and web roll Ace wrap and Polar [facility] patient center comes stable condition EBL was 700 with 375 referring through some Cell Saver tourniquet time 69 minutes condition comes stable  PLAN OF CARE: Admit to inpatient   PATIENT DISPOSITION:  PACU - hemodynamically stable.

## 2015-06-21 ENCOUNTER — Encounter: Payer: Self-pay | Admitting: Orthopedic Surgery

## 2015-06-21 LAB — BASIC METABOLIC PANEL
Anion gap: 5 (ref 5–15)
BUN: 20 mg/dL (ref 6–20)
CHLORIDE: 103 mmol/L (ref 101–111)
CO2: 28 mmol/L (ref 22–32)
Calcium: 8.3 mg/dL — ABNORMAL LOW (ref 8.9–10.3)
Creatinine, Ser: 0.93 mg/dL (ref 0.61–1.24)
GFR calc Af Amer: >60 mL/min (ref >60)
GLUCOSE: 153 mg/dL — AB (ref 65–99)
POTASSIUM: 4.6 mmol/L (ref 3.5–5.1)
Sodium: 136 mmol/L (ref 135–145)

## 2015-06-21 LAB — CBC
HEMATOCRIT: 34.5 % — AB (ref 40.0–52.0)
HEMOGLOBIN: 11.9 g/dL — AB (ref 13.0–18.0)
MCH: 31.1 pg (ref 26.0–34.0)
MCHC: 34.5 g/dL (ref 32.0–36.0)
MCV: 90.1 fL (ref 80.0–100.0)
Platelets: 115 10*3/uL — ABNORMAL LOW (ref 150–440)
RBC: 3.83 MIL/uL — AB (ref 4.40–5.90)
RDW: 13.2 % (ref 11.5–14.5)
WBC: 8 10*3/uL (ref 3.8–10.6)

## 2015-06-21 NOTE — Plan of Care (Signed)
Problem: Acute Rehab PT Goals(only PT should resolve) Goal: Pt Will Ambulate Pt will ambulate with RW at Supervision using a step-through pattern and equal step length for a distances greater than 200+ft to demonstrate the ability to perform safe household distance ambulation at discharge.    Goal: Pt Will Go Up/Down Stairs Pt will ascend/descend stairs with LRAD and 1 HR c modified indep to demonstrate safe entry/exit of home.

## 2015-06-21 NOTE — Care Management Note (Signed)
Case Management Note  Patient Details  Name: [name]. MRN: [id number] Date of Birth: [birth year]  Subjective/Objective:                   Met with patient to discuss discharge planning. He would like to return home followed by [place] and states he "has already spoken with [facility]". He has a front-wheeled walker available at home. He is already on Eliquis so I will not call Lovenox in. He uses [facility] for Rx. Action/Plan: RNCM to continue to follow. Referral made to [place] with [facility] per patient request.  Expected Discharge Date:                  Expected Discharge Plan:     In-House Referral:  Clinical Social Work  Discharge planning Services  CM Consult  Post Acute Care Choice:    Choice offered to:  Patient  DME Arranged:    DME Agency:     HH Arranged:  PT [place]:  [place]  Status of Service:     Medicare Important Message Given:    Date Medicare IM Given:    Medicare IM give by:    Date Additional Medicare IM Given:    Additional Medicare Important Message give by:     If discussed at [place] of Stay Meetings, dates discussed:    Additional Comments:  Marshell Garfinkel, RN 06/21/2015, 9:57 AM

## 2015-06-21 NOTE — Evaluation (Signed)
Physical Therapy Evaluation Patient Details Name: [name]. MRN: [id number] DOB: 1941-01-01 Today's Date: 06/21/2015   History of Present Illness  Pt: 74yo white male, Hx of L TKA 10ya, c gradudally worsening function and swelling in last 3 years. Pt is very active, walking 3-5 miles daily. Pt presents today s/p revisition of said TKA, which he underwent yesterday.   Clinical Impression  Pt is received semirecumbent in bed upon entry, awake, alert, and willing to participate. No acute distress noted.  Pt is A&Ox3 and pleasant. Pt strength as screened by functional mobility assessment presents c mild impairment, predominantly in LLE. Pt on room-air with SaO2:90% throughout evaluation, and encouraged to continue c spirometry practice. Pain is about a 4/10 when received c return to 4/10 c activity. Patient presenting with impairment of strength, range of motion, balance, and activity tolerance, limiting ability to perform ADL and mobility tasks at  baseline level of function. Patient will benefit from skilled intervention to address the above impairments and limitations, in order to restore to prior level of function, improve patient safety upon discharge, and to decrease falls risk.       Follow Up Recommendations Home health PT    Equipment Recommendations  Rolling walker with 5" wheels    Recommendations for Other Services       Precautions / Restrictions Precautions Required Braces or Orthoses: Knee Immobilizer - Left Knee Immobilizer - Left: On except when in CPM;On when out of bed or walking Restrictions Weight Bearing Restrictions: Yes LLE Weight Bearing: Weight bearing as tolerated      Mobility  Bed Mobility Overal bed mobility: Modified Independent                Transfers Overall transfer level: Needs assistance Equipment used: Rolling walker (2 wheeled) Transfers: Sit to/from Stand Sit to Stand: Min guard         General transfer comment: verbal  cues to maintain RW closer to hips.   Ambulation/Gait Ambulation/Gait assistance: Min guard Ambulation Distance (Feet): 120 Feet Assistive device: Rolling walker (2 wheeled) Gait Pattern/deviations: Antalgic Gait velocity: 0.50m/s Gait velocity interpretation: <1.8 ft/sec, indicative of risk for recurrent falls General Gait Details: Mildly painful, but improving c distance.   Stairs            Wheelchair Mobility    Modified Rankin (Stroke Patients Only)       Balance Overall balance assessment: Modified Independent                                           Pertinent Vitals/Pain Pain Assessment: 0-10 Pain Score: 4  Pain Location: L knee/upper thigh Pain Intervention(s): Monitored during session;Premedicated before session;Ice applied    Home Living Family/patient expects to be discharged to:: Private residence Living Arrangements: Spouse/significant other Available Help at Discharge: Family Type of Home: House Home Access: Stairs to enter Entrance Stairs-Rails: Left;Right;Can reach both Entrance Stairs-Number of Steps: 5 Home Layout: Two level;Able to live on main level with bedroom/bathroom Home Equipment: Gilford Rile - 2 wheels;Cane - single point      Prior Function Level of Independence: Independent         Comments: Very active, daily walker of several miles.      Hand Dominance        Extremity/Trunk Assessment   Upper Extremity Assessment: Overall WFL for tasks assessed  Lower Extremity Assessment: Overall WFL for tasks assessed;LLE deficits/detail   LLE Deficits / Details: typical deficts on POD1 for TKA. Mild weakness, mildly limited ROM.   Cervical / Trunk Assessment: Normal  Communication   Communication: No difficulties  Cognition Arousal/Alertness: Awake/alert Behavior During Therapy: WFL for tasks assessed/performed Overall Cognitive Status: Within Functional Limits for tasks assessed                       General Comments      Exercises Total Joint Exercises Ankle Circles/Pumps: AROM;Both;15 reps;Supine Quad Sets: AROM;Strengthening;Left;10 reps (3 sec) Hip ABduction/ADduction: AAROM;Strengthening;Left;10 reps;Supine Straight Leg Raises: AAROM;Strengthening;Left;10 reps;Supine Long Arc Quad: AAROM;Left;5 reps;Seated Goniometric ROM: 32-74 degrees. Most painful c extension.       Assessment/Plan    PT Assessment Patient needs continued PT services  PT Diagnosis Difficulty walking;Abnormality of gait;Generalized weakness;Acute pain   PT Problem List Decreased strength;Decreased range of motion;Decreased knowledge of use of DME;Decreased activity tolerance;Pain;Decreased balance;Decreased mobility  PT Treatment Interventions Gait training;Balance training;DME instruction;Stair training;Functional mobility training;Therapeutic activities;Therapeutic exercise   PT Goals (Current goals can be found in the Care Plan section) Acute Rehab PT Goals Patient Stated Goal: DC to home and recover there with help from wife.  PT Goal Formulation: With patient Time For Goal Achievement: 07/05/15 Potential to Achieve Goals: Good    Frequency BID   Barriers to discharge        Co-evaluation               End of Session Equipment Utilized During Treatment: Gait belt Activity Tolerance: Patient tolerated treatment well Patient left: in chair;with call bell/phone within reach;with chair alarm set Nurse Communication: Mobility status         Time: 8756-4332 PT Time Calculation (min) (ACUTE ONLY): 36 min   Charges:   PT Evaluation $Initial PT Evaluation Tier I: 1 Procedure PT Treatments $Therapeutic Exercise: 8-22 mins   PT G Codes:        Shaquon Gropp C 18-Jul-2015, 10:52 AM  10:54 AM  Etta Grandchild, PT, DPT [place] License # [id number]

## 2015-06-21 NOTE — Evaluation (Signed)
Occupational Therapy Evaluation Patient Details Name: [name]. MRN: [id number] DOB: [birth year] Today's Date: 06/21/2015    History of Present Illness Pt: 74yo white male, Hx of L TKA 10 years ago, and has had increasing pain over the last 3 years. Pt is very active, walking 3-5 miles daily.    Clinical Impression   This patient is a 74 year old male who came to [facility] for a L total knee replacement revision.  Patient lives in a home with his wife and can stay on one floor.  He had been independent with ADL and functional mobility and very active. He practiced lower body dressing and needed set up and verbal cues only for lower body dressing.  He did use the hip kit and list of vendors given, but most likely will not need it with in a few days.      Follow Up Recommendations  No OT follow up    Equipment Recommendations   Most likely will not need hip kit in a few days.    Recommendations for Other Services       Precautions / Restrictions Precautions Precautions: Knee Required Braces or Orthoses: Knee Immobilizer - Right Knee Immobilizer - Left: On except when in CPM;On when out of bed or walking Restrictions Weight Bearing Restrictions: Yes LLE Weight Bearing: Weight bearing as tolerated      Mobility Bed Mobility Overal bed mobility: Modified Independent                Transfers             Balance                                          ADL                                         General ADL Comments: Had been independent, practiced donning and doffing socks and practiced technique for pants using hip kit with cues only. Most likely will not need hip kit with in a few days.     Vision     Perception     Praxis      Pertinent Vitals/Pain Pain Assessment: 0-10 Pain Score: 4  Pain Location: L knee/upper thigh Pain Intervention(s): Monitored during session;Premedicated  before session;Ice applied     Hand Dominance     Extremity/Trunk Assessment Upper Extremity Assessment Upper Extremity Assessment: Overall WFL for tasks assessed        Communication Communication Communication: No difficulties   Cognition Arousal/Alertness: Awake/alert Behavior During Therapy: WFL for tasks assessed/performed Overall Cognitive Status: Within Functional Limits for tasks assessed                     General Comments       Exercises       Shoulder Instructions      Home Living Family/patient expects to be discharged to:: Private residence Living Arrangements: Spouse/significant other Available Help at Discharge: Family Type of Home: House Home Access: Stairs to enter Technical brewer of Steps: 5 Entrance Stairs-Rails: Left;Right;Can reach both Home Layout: Two level;Able to live on main level with bedroom/bathroom               Home Equipment:  Walker - 2 wheels;Cane - single point          Prior Functioning/Environment Level of Independence: Independent        Comments: Very active     OT Diagnosis: Acute pain   OT Problem List:     OT Treatment/Interventions:      OT Goals(Current goals can be found in the care plan section) Acute Rehab OT Goals Patient Stated Goal: Home with wife  OT Frequency:     Barriers to D/C:            Co-evaluation              End of Session Equipment Utilized During Treatment:  (Hip kit)  Activity Tolerance: Patient tolerated treatment well Patient left: in bed;with call bell/phone within reach;with bed alarm set   Time: 9774-1423 OT Time Calculation (min): 24 min Charges:  OT General Charges $OT Visit: 1 Procedure OT Evaluation $Initial OT Evaluation Tier I: 1 Procedure OT Treatments $Self Care/Home Management : 8-22 mins G-Codes:    Myrene Galas, MS/OTR/L  06/21/2015, 1:45 PM

## 2015-06-21 NOTE — Progress Notes (Signed)
Clinical Social Worker (CSW) received SNF consult. PT is recommending home health. RN Case Manager aware of above. Please reconsult if future social work needs arise. CSW signing off.   Violetta Lavalle Morgan, LCSWA [phone] 

## 2015-06-21 NOTE — Anesthesia Postprocedure Evaluation (Signed)
  Anesthesia Post-op Note  Patient: [name]  Procedure(s) Performed: Procedure(s): TOTAL KNEE REVISION (Left)  Anesthesia type:Spinal  Patient location:Floor  Post pain: Pain level controlled  Post assessment: Post-op Vital signs reviewed, Patient's Cardiovascular Status Stable, Respiratory Function Stable, Patent Airway and No signs of Nausea or vomiting  Post vital signs: Reviewed and stable  Last Vitals:  Filed Vitals:   06/21/15 0451  BP: 119/69  Pulse: 60  Temp: 37.4 C  Resp: 20    Level of consciousness: awake, alert  and patient cooperative  Complications: No apparent anesthesia complications

## 2015-06-21 NOTE — Care Management (Signed)
Important Message  Patient Details  Name: [name]. MRN: [id number] Date of Birth: 06-Dec-1941   Medicare Important Message Given:  Yes-second notification given    Darius Bump Allmond 06/21/2015, 12:20 PM

## 2015-06-21 NOTE — Anesthesia Post-op Follow-up Note (Signed)
  Anesthesia Pain Follow-up Note  Patient: [name].  Day #: 1  Date of Follow-up: 06/21/2015 Time: 7:26 AM  Last Vitals:  Filed Vitals:   06/21/15 0451  BP: 119/69  Pulse: 60  Temp: 37.4 C  Resp: 20    Level of Consciousness: alert  Pain: mild   Side Effects:None  Catheter Site Exam:Not evaluated  Plan: D/C from anesthesia care  Blima Singer

## 2015-06-21 NOTE — Progress Notes (Signed)
   Subjective: 1 Day Post-Op Procedure(s) (LRB): TOTAL KNEE REVISION (Left) Patient reports pain as mild.   Patient is well, and has had no acute complaints or problems We will start therapy today.  Plan is to go Home after hospital stay.  Objective: Vital signs in last 24 hours: Temp:  [97.2 F (36.2 C)-99.4 F (37.4 C)] 99.2 F (37.3 C) (07/01 0744) Pulse Rate:  [58-82] 62 (07/01 0744) Resp:  [14-20] 14 (07/01 0744) BP: (104-128)/(60-79) 128/65 mmHg (07/01 0744) SpO2:  [94 %-100 %] 95 % (07/01 0744)  Intake/Output from previous day: 06/30 0701 - 07/01 0700 In: 3275 [I.V.:2900; Blood:375] Out: 1625 [Urine:925; Blood:700] Intake/Output this shift:     Recent Labs  06/20/15 1443 06/21/15 0559  HGB 13.2 11.9*    Recent Labs  06/20/15 1443 06/21/15 0559  WBC 10.4 8.0  RBC 4.32* 3.83*  HCT 39.4* 34.5*  PLT 122* 115*    Recent Labs  06/20/15 1443 06/21/15 0559  NA  --  136  K  --  4.6  CL  --  103  CO2  --  28  BUN  --  20  CREATININE 0.98 0.93  GLUCOSE  --  153*  CALCIUM  --  8.3*   No results for input(s): LABPT, INR in the last 72 hours.  EXAM General - Patient is Alert, Appropriate and Oriented Extremity - Neurovascular intact Sensation intact distally Intact pulses distally Dorsiflexion/Plantar flexion intact Dressing - dressing C/D/I and no drainage Motor Function - intact, moving foot and toes well on exam. Able to straight leg raise  Past Medical History  Diagnosis Date  . ischemic cardiomyopathy     stent to a 95%-occluded LAD in 1995; catheterization in 2012 demonstrated patent grafts and nonobstructive disease  . Syncope        . Cardiac arrest   . Biventricular defibrillator-Medtronic     Generator replacement 2012 with CRT upgrade  . Severe sinus bradycardia   . High-grade heart block   . TIA (transient ischemic attack) nov. 2011  . GERD (gastroesophageal reflux disease)   . Allergic rhinitis   . Dyspnea     PFT 12/19/10:  FEV1 2.93(107%), FEV1% 72, TLC 8.02(138%), DLCO 110%, +BD  . Sciatic nerve pain   . GERD (gastroesophageal reflux disease)   . Hypertension   . Hyperlipidemia   . BPH (benign prostatic hyperplasia)   . ED (erectile dysfunction)   . Asthma   . AICD (automatic cardioverter/defibrillator) present   . Arthritis   . Myocardial infarction     Assessment/Plan:   1 Day Post-Op Procedure(s) (LRB): TOTAL KNEE REVISION (Left) Active Problems:   Failed total knee arthroplasty acute post op blood loss anemia  Estimated body mass index is 29.75 kg/(m^2) as calculated from the following:   Height as of this encounter: 5\' 7"  (1.702 m).   Weight as of this encounter: 86.183 kg (190 lb). CM to assist with discharge PT to begin today Needs BM Recheck labs in the am [facility] diet as tolerated   DVT Prophylaxis - Aspirin, Foot Pumps, TED hose and Eliquis Weight-Bearing as tolerated to left leg D/C O2 and Pulse OX and try on Room Air  T. Rachelle Hora, PA-C [place] 06/21/2015, 7:48 AM

## 2015-06-21 NOTE — Progress Notes (Signed)
Physical Therapy Treatment Patient Details Name: [name]. MRN: [id number] DOB: Jul 14, 1941 Today's Date: 06/21/2015    History of Present Illness Pt: 74yo white male, Hx of L TKA 10ya, c gradudally worsening function and swelling in last 3 years. Pt is very active, walking 3-5 miles daily. Pt presents today s/p revisition of said TKA, which he underwent yesterday.     PT Comments    Pt tolerating treatment session well, motivated and able to complete entire PT sesssion as planned. Pt continues to make progress toward goals as evidenced by improved pain response during and ambulation, as well as improved gait quality and ease in gait effort. Pt's greatest limitation continues to be extension ROM which continues to limit ability to perform ambulation at baseline function. Patient presenting with impairment of strength, pain, range of motion, balance, and activity tolerance, limiting ability to perform ADL and mobility tasks at  baseline level of function. Patient will benefit from skilled intervention to address the above impairments and limitations, in order to restore to prior level of function, improve patient safety upon discharge, and to decrease caregiver burden.    Follow Up Recommendations  Home health PT     Equipment Recommendations  Rolling walker with 5" wheels;Other (comment) (Pt already has RW at home. )    Recommendations for Other Services       Precautions / Restrictions Precautions Precautions: Knee Precaution Booklet Issued: No Required Braces or Orthoses: Knee Immobilizer - Left Knee Immobilizer - Left: On except when in CPM;On when out of bed or walking Restrictions Weight Bearing Restrictions: Yes LLE Weight Bearing: Weight bearing as tolerated    Mobility  Bed Mobility Overal bed mobility: Modified Independent                Transfers Overall transfer level: Needs assistance Equipment used: Rolling walker (2 wheeled) Transfers: Sit  to/from Stand Sit to Stand: Supervision         General transfer comment: demonstrates improved control ands safety   Ambulation/Gait Ambulation/Gait assistance: Min guard Ambulation Distance (Feet): 260 Feet Assistive device: Rolling walker (2 wheeled) Gait Pattern/deviations: Step-through pattern;Decreased weight shift to left (decreased TKE during gait. )   Gait velocity interpretation: <1.8 ft/sec, indicative of risk for recurrent falls General Gait Details: Mildly painful, but improving c distance.    Stairs            Wheelchair Mobility    Modified Rankin (Stroke Patients Only)       Balance Overall balance assessment: Modified Independent                                  Cognition Arousal/Alertness: Awake/alert Behavior During Therapy: WFL for tasks assessed/performed Overall Cognitive Status: Within Functional Limits for tasks assessed                      Exercises Total Joint Exercises Ankle Circles/Pumps: AROM;Both;15 reps;Supine Quad Sets: AROM;Strengthening;Left;10 reps Short Arc Quad: AAROM;Strengthening;Left;15 reps;Supine Heel Slides: AAROM;Left;15 reps;Supine Hip ABduction/ADduction: AAROM;Strengthening;Left;Supine;15 reps Straight Leg Raises: AAROM;Strengthening;Left;Supine;15 reps    General Comments        Pertinent Vitals/Pain Pain Assessment: 0-10 Pain Score: 4  Pain Location: L knee, upper thigh. Pain Descriptors / Indicators: Aching;Sore Pain Intervention(s): Monitored during session;Ice applied    Home Living Family/patient expects to be discharged to:: Private residence Living Arrangements: Spouse/significant other Available Help at Discharge: Family Type of  Home: House Home Access: Stairs to enter Entrance Stairs-Rails: Left;Right;Can reach both Home Layout: Two level;Able to live on main level with bedroom/bathroom Home Equipment: Gilford Rile - 2 wheels;Cane - single point      Prior Function Level  of Independence: Independent      Comments: Very active    PT Goals (current goals can now be found in the care plan section) Acute Rehab PT Goals Patient Stated Goal: Home with wife PT Goal Formulation: With patient Time For Goal Achievement: 07/05/15 Potential to Achieve Goals: Good Progress towards PT goals: Progressing toward goals;PT to reassess next treatment    Frequency  BID    PT Plan Current plan remains appropriate    Co-evaluation             End of Session Equipment Utilized During Treatment: Gait belt Activity Tolerance: Patient tolerated treatment well Patient left: in chair;with call bell/phone within reach;with chair alarm set     Time: 1355-1421 PT Time Calculation (min) (ACUTE ONLY): 26 min  Charges:  $Gait Training: 8-22 mins $Therapeutic Exercise: 8-22 mins                    G Codes:      Buccola,[name] 13-Jul-2015, 3:18 PM  3:20 PM  Etta Grandchild, PT, DPT [place] License # [id number]

## 2015-06-22 LAB — URINALYSIS COMPLETE WITH MICROSCOPIC (ARMC ONLY)
BACTERIA UA: NONE SEEN
Bilirubin Urine: NEGATIVE
Glucose, UA: >500 mg/dL — AB
Ketones, ur: NEGATIVE mg/dL
NITRITE: NEGATIVE
PROTEIN: NEGATIVE mg/dL
Specific Gravity, Urine: 1.011 (ref 1.005–1.030)
pH: 7 (ref 5.0–8.0)

## 2015-06-22 LAB — BASIC METABOLIC PANEL
Anion gap: 5 (ref 5–15)
BUN: 17 mg/dL (ref 6–20)
CALCIUM: 8.7 mg/dL — AB (ref 8.9–10.3)
CHLORIDE: 103 mmol/L (ref 101–111)
CO2: 29 mmol/L (ref 22–32)
Creatinine, Ser: 0.93 mg/dL (ref 0.61–1.24)
GFR calc Af Amer: >60 mL/min (ref >60)
GFR calc non Af Amer: >60 mL/min (ref >60)
GLUCOSE: 156 mg/dL — AB (ref 65–99)
POTASSIUM: 4.2 mmol/L (ref 3.5–5.1)
SODIUM: 137 mmol/L (ref 135–145)

## 2015-06-22 LAB — CBC
HCT: 34.7 % — ABNORMAL LOW (ref 40.0–52.0)
Hemoglobin: 11.5 g/dL — ABNORMAL LOW (ref 13.0–18.0)
MCH: 30.3 pg (ref 26.0–34.0)
MCHC: 33.3 g/dL (ref 32.0–36.0)
MCV: 91.1 fL (ref 80.0–100.0)
Platelets: 108 10*3/uL — ABNORMAL LOW (ref 150–440)
RBC: 3.81 MIL/uL — AB (ref 4.40–5.90)
RDW: 13.2 % (ref 11.5–14.5)
WBC: 8.7 10*3/uL (ref 3.8–10.6)

## 2015-06-22 LAB — ANAEROBIC CULTURE

## 2015-06-22 MED ORDER — BISACODYL 10 MG RE SUPP: 10.0000 mg | Freq: Every day | RECTAL | Status: DC | PRN

## 2015-06-22 MED ORDER — FLEET ENEMA 7-19 GM/118ML RE ENEM: 1.0000 | ENEMA | Freq: Every day | RECTAL | Status: DC | PRN

## 2015-06-22 NOTE — Progress Notes (Signed)
  Subjective: 2 Days Post-Op Procedure(s) (LRB): TOTAL KNEE REVISION (Left) Patient reports pain as mild.   Patient is well, and has had no acute complaints or problems Plan is to go Home with PT per physical therapy recommendations after hospital stay. Negative for chest pain and shortness of breath Fever: no, 99.1 this morning Gastrointestinal:Negative for nausea and vomiting  Objective: Vital signs in last 24 hours: Temp:  [98.1 F (36.7 C)-100 F (37.8 C)] 99.1 F (37.3 C) (07/02 0756) Pulse Rate:  [58-66] 62 (07/02 0756) Resp:  [14-18] 16 (07/02 0756) BP: (118-142)/(51-75) 133/75 mmHg (07/02 0756) SpO2:  [86 %-94 %] 93 % (07/02 0756)  Intake/Output from previous day:  Intake/Output Summary (Last 24 hours) at 06/22/15 0907 Last data filed at 06/22/15 0840  Gross per 24 hour  Intake    240 ml  Output   2750 ml  Net  -2510 ml    Intake/Output this shift: Total I/O In: 120 [P.O.:120] Out: 450 [Urine:450]  Labs:  Recent Labs  06/20/15 1443 06/21/15 0559 06/22/15 0352  HGB 13.2 11.9* 11.5*    Recent Labs  06/21/15 0559 06/22/15 0352  WBC 8.0 8.7  RBC 3.83* 3.81*  HCT 34.5* 34.7*  PLT 115* 108*    Recent Labs  06/21/15 0559 06/22/15 0352  NA 136 137  K 4.6 4.2  CL 103 103  CO2 28 29  BUN 20 17  CREATININE 0.93 0.93  GLUCOSE 153* 156*  CALCIUM 8.3* 8.7*   No results for input(s): LABPT, INR in the last 72 hours.   EXAM General - Patient is Alert, Appropriate and Oriented Extremity - Neurologically intact ABD soft Dorsiflexion/Plantar flexion intact Incision: dressing C/D/I Dressing/Incision - clean, dry.  Honeycomb dressing applied today. Motor Function - intact, moving foot and toes well on exam.  Past Medical History  Diagnosis Date  . ischemic cardiomyopathy     stent to a 95%-occluded LAD in 1995; catheterization in 2012 demonstrated patent grafts and nonobstructive disease  . Syncope        . Cardiac arrest   . Biventricular  defibrillator-Medtronic     Generator replacement 2012 with CRT upgrade  . Severe sinus bradycardia   . High-grade heart block   . TIA (transient ischemic attack) nov. 2011  . GERD (gastroesophageal reflux disease)   . Allergic rhinitis   . Dyspnea     PFT 12/19/10: FEV1 2.93(107%), FEV1% 72, TLC 8.02(138%), DLCO 110%, +BD  . Sciatic nerve pain   . GERD (gastroesophageal reflux disease)   . Hypertension   . Hyperlipidemia   . BPH (benign prostatic hyperplasia)   . ED (erectile dysfunction)   . Asthma   . AICD (automatic cardioverter/defibrillator) present   . Arthritis   . Myocardial infarction     Assessment/Plan: 2 Days Post-Op Procedure(s) (LRB): TOTAL KNEE REVISION (Left) Active Problems:   Failed total knee arthroplasty  Estimated body mass index is 29.75 kg/(m^2) as calculated from the following:   Height as of this encounter: 5\' 7"  (1.702 m).   Weight as of this encounter: 86.183 kg (190 lb). Advance diet Up with therapy Plan for discharge tomorrow   Pt is complaining of some burning with urination.  Although unlikely, will order UA to check. Labs reviewed. Encouraged incentive spirometer usage. CBC and BMP ordered for tomorrow.  DVT Prophylaxis - Foot Pumps, TED hose and Eliquis Weight-Bearing as tolerated to left leg  J. Cameron Proud, PA-C [facility] 06/22/2015, 9:07 AM

## 2015-06-22 NOTE — Progress Notes (Signed)
Physical Therapy Treatment Patient Details Name: [name]. MRN: [id number] DOB: 09-24-1941 Today's Date: 06/22/2015    History of Present Illness Pt: 74yo white male, Hx of L TKA 10ya, c gradudally worsening function and swelling in last 3 years. Pt is very active, walking 3-5 miles daily. Pt presents today s/p revisition of said TKA, which he underwent yesterday.     PT Comments    Pt is getting up to walk with good effort, eager to try steps and move into more independence.  Has some comfort when sitting still in chair with elevated legs, no pain in that situation.  Very good recall of safe stair technique, very motivated to get home.  Follow Up Recommendations  Home health PT     Equipment Recommendations  Rolling walker with 5" wheels;Other (comment)    Recommendations for Other Services       Precautions / Restrictions Precautions Precautions: Knee;Fall Required Braces or Orthoses: Knee Immobilizer - Left (Able to do 10 SLR's) Restrictions Weight Bearing Restrictions: Yes LLE Weight Bearing: Weight bearing as tolerated    Mobility  Bed Mobility Overal bed mobility: Modified Independent                Transfers Overall transfer level: Needs assistance Equipment used: Rolling walker (2 wheeled);1 person hand held assist Transfers: Sit to/from [employer] Transfers Sit to Stand: Supervision Stand pivot transfers: Supervision;Min guard       General transfer comment: Reminders for hand placment to sit  Ambulation/Gait Ambulation/Gait assistance: Min guard Ambulation Distance (Feet): 200 Feet Assistive device: Rolling walker (2 wheeled) Gait Pattern/deviations: Step-through pattern;Trunk flexed;Wide base of support   Gait velocity interpretation: Below normal speed for age/gender General Gait Details: feeling unsteadiness on LLE but controlling   Stairs Stairs: Yes Stairs assistance: Min assist Stair Management: Two  rails;Forwards;Step to pattern Number of Stairs: 8 General stair comments: good technique and remembers pattern from previous TKA  Wheelchair Mobility    Modified Rankin (Stroke Patients Only)       Balance                                    Cognition Arousal/Alertness: Awake/alert Behavior During Therapy: WFL for tasks assessed/performed Overall Cognitive Status: Within Functional Limits for tasks assessed                      Exercises Total Joint Exercises Ankle Circles/Pumps: AROM;Both;5 reps Quad Sets: AROM;Both;10 reps    General Comments        Pertinent Vitals/Pain Pain Assessment: Faces Pain Score: 0-No pain Faces Pain Scale: No hurt    Home Living                      Prior Function            PT Goals (current goals can now be found in the care plan section) Acute Rehab PT Goals Patient Stated Goal: Home with wife Progress towards PT goals: Progressing toward goals    Frequency  BID    PT Plan Current plan remains appropriate    Co-evaluation             End of Session Equipment Utilized During Treatment: Gait belt Activity Tolerance: Patient tolerated treatment well Patient left: in chair;with call bell/phone within reach;with chair alarm set     Time: 1205-1239 PT Time Calculation (min) (  ACUTE ONLY): 34 min  Charges:  $Gait Training: 23-37 mins                    G Codes:      Ramond Dial Jun 30, 2015, 1:13 PM   Mee Hives, PT MS Acute Rehab Dept. Number: [facility] [id number] and [place] [phone]

## 2015-06-22 NOTE — Progress Notes (Signed)
Pt. denies any nausea or vomiting, and notes some flatus, but denies any BM. On exam, his abdomen is distended and tympanitic, although non-tender. I have advised him to progress slowly with his diet and not try to eat too much. Also have ordered prn Dulcolax suppository and Fleet enema for him to ask for if he feels it becomes necessary.

## 2015-06-22 NOTE — Progress Notes (Signed)
Physical Therapy Treatment Patient Details Name: [name]. MRN: [id number] DOB: 1941/03/19 Today's Date: 06/22/2015    History of Present Illness Pt: 74yo white male, Hx of L TKA 10ya, c gradudally worsening function and swelling in last 3 years. Pt is very active, walking 3-5 miles daily. Pt presents today s/p revisition of said TKA, which he underwent yesterday.     PT Comments    Pt was seen for evaluation of his endurance with gait and with his RW and a great deal of reliance on it for support of LLE he was able to walk.  Talked with him about trying steps in AM again, but is quite ready to go home.  He is looking forward to returning to his usual home and work activity and motivated to be independent.  Follow Up Recommendations  Home health PT     Equipment Recommendations  Rolling walker with 5" wheels;Other (comment)    Recommendations for Other Services       Precautions / Restrictions Precautions Precautions: Knee;Fall Required Braces or Orthoses: Knee Immobilizer - Left (Able to do 10 SLR's) Restrictions Weight Bearing Restrictions: Yes LLE Weight Bearing: Weight bearing as tolerated    Mobility  Bed Mobility Overal bed mobility: Modified Independent                Transfers Overall transfer level: Needs assistance Equipment used: Rolling walker (2 wheeled);1 person hand held assist   Sit to Stand: Supervision Stand pivot transfers: Supervision;Min guard       General transfer comment: Reminders for hand placment to sit  Ambulation/Gait Ambulation/Gait assistance: Min guard Ambulation Distance (Feet): 300 Feet Assistive device: Rolling walker (2 wheeled) Gait Pattern/deviations: Step-through pattern;Decreased weight shift to left;Trunk flexed;Wide base of support Gait velocity: reduced Gait velocity interpretation: Below normal speed for age/gender General Gait Details: feeling unsteadiness on LLE but controlling   Stairs             Wheelchair Mobility    Modified Rankin (Stroke Patients Only)       Balance                                    Cognition Arousal/Alertness: Awake/alert Behavior During Therapy: WFL for tasks assessed/performed Overall Cognitive Status: Within Functional Limits for tasks assessed                      Exercises Total Joint Exercises Ankle Circles/Pumps: AROM;Both;5 reps Quad Sets: AROM;Both;10 reps    General Comments        Pertinent Vitals/Pain Pain Score: 0-No pain Faces Pain Scale: No hurt    Home Living                      Prior Function            PT Goals (current goals can now be found in the care plan section) Acute Rehab PT Goals Patient Stated Goal: Home with wife Progress towards PT goals: Progressing toward goals    Frequency  BID    PT Plan Current plan remains appropriate    Co-evaluation             End of Session Equipment Utilized During Treatment: Gait belt Activity Tolerance: Patient tolerated treatment well Patient left: in chair;with call bell/phone within reach;with chair alarm set     Time: 5102-5852 PT Time Calculation (min) (  ACUTE ONLY): 33 min  Charges:  $Gait Training: 8-22 mins $Therapeutic Activity: 8-22 mins                    G Codes:      Ramond Dial 07/14/15, 4:12 PM   Mee Hives, PT MS Acute Rehab Dept. Number: [facility] [id number] and [place] [phone]

## 2015-06-23 LAB — BASIC METABOLIC PANEL
Anion gap: 9 (ref 5–15)
BUN: 19 mg/dL (ref 6–20)
CALCIUM: 9 mg/dL (ref 8.9–10.3)
CO2: 27 mmol/L (ref 22–32)
CREATININE: 0.88 mg/dL (ref 0.61–1.24)
Chloride: 101 mmol/L (ref 101–111)
GFR calc Af Amer: >60 mL/min (ref >60)
GFR calc non Af Amer: >60 mL/min (ref >60)
Glucose, Bld: 134 mg/dL — ABNORMAL HIGH (ref 65–99)
Potassium: 4.2 mmol/L (ref 3.5–5.1)
SODIUM: 137 mmol/L (ref 135–145)

## 2015-06-23 LAB — CBC
HCT: 33.7 % — ABNORMAL LOW (ref 40.0–52.0)
Hemoglobin: 11.4 g/dL — ABNORMAL LOW (ref 13.0–18.0)
MCH: 30.7 pg (ref 26.0–34.0)
MCHC: 33.9 g/dL (ref 32.0–36.0)
MCV: 90.7 fL (ref 80.0–100.0)
Platelets: 108 10*3/uL — ABNORMAL LOW (ref 150–440)
RBC: 3.71 MIL/uL — AB (ref 4.40–5.90)
RDW: 13.1 % (ref 11.5–14.5)
WBC: 8.7 10*3/uL (ref 3.8–10.6)

## 2015-06-23 LAB — WOUND CULTURE: Culture: NO GROWTH

## 2015-06-23 MED ORDER — TRAMADOL HCL 50 MG PO TABS: 50.0000 mg | ORAL_TABLET | Freq: Four times a day (QID) | ORAL | Status: DC | PRN

## 2015-06-23 MED ORDER — HYDROCODONE-IBUPROFEN 5-200 MG PO TABS: 1.0000 | ORAL_TABLET | Freq: Four times a day (QID) | ORAL | Status: DC | PRN

## 2015-06-23 NOTE — Discharge Instructions (Signed)

## 2015-06-23 NOTE — Progress Notes (Signed)
Discharge Note:  Patient discharge to home. Instructions and prescriptions given to patient, verbalized understanding. Wife providing transportation.

## 2015-06-23 NOTE — Discharge Summary (Signed)
Physician Discharge Summary  Patient ID: [name]. MRN: [id number] DOB/AGE: 26-Nov-1941 74 y.o.  Admit date: 06/20/2015 Discharge date: 06/23/2015  Admission Diagnoses:  FAILURE OF TOTAL KNEE ARTHROPLASTY   Discharge Diagnoses: Patient Active Problem List   Diagnosis Date Noted  . 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 Hyperlipidemia 08/25/2012  . Atrial fibrillation 08/25/2012  . Biventricular cardioverter-defibrillator-Medtronic   . Severe sinus bradycardia   . High-grade heart block   . SHORTNESS OF BREATH 01/16/2011  . SYSTOLIC HEART FAILURE, CHRONIC 10/14/2010  . HYPERTENSION, BENIGN 10/15/2009  . CARDIOMYOPATHY, ISCHEMIC 10/15/2009  . 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 ischemic cardiomyopathy     stent to a 95%-occluded LAD in 1995; catheterization in 2012 demonstrated patent grafts and nonobstructive disease  . Syncope        . Cardiac arrest   . Biventricular defibrillator-Medtronic     Generator replacement 2012 with CRT upgrade  . Severe sinus bradycardia   . High-grade heart block   . TIA (transient ischemic attack) nov. 2011  . GERD (gastroesophageal reflux disease)   . Allergic rhinitis   . Dyspnea     PFT 12/19/10: FEV1 2.93(107%), FEV1% 72, TLC 8.02(138%), DLCO 110%, +BD  . Sciatic nerve pain   . GERD (gastroesophageal reflux disease)   . Hypertension   . Hyperlipidemia   . BPH (benign prostatic hyperplasia)   . ED (erectile dysfunction)   . Asthma   . AICD (automatic cardioverter/defibrillator) present   . Arthritis   . Myocardial infarction      Transfusion: None   Consultants (if any):  None  Discharged Condition: Improved  Hospital Course: [name]. is an 74 y.o. male who was admitted 06/20/2015 with a diagnosis of failure of total knee arthroplasty of the left knee with knee effusion and went to the operating room on 06/20/2015 and underwent a left total  knee revision.   Surgeries: Procedure(s): TOTAL KNEE REVISION on 06/20/2015 Patient tolerated the surgery well. Taken to PACU where she was stabilized and then transferred to the orthopedic floor.  He continued taking Eliquis 5mg  2 times daily . Foot pumps applied bilaterally at 80 mm. Heels elevated on bed with rolled towels. No evidence of DVT. Negative Homan. Physical therapy started on day #1 for gait training and transfer. OT started day #1 for ADL and assisted devices.  Patient's IV , foley were d/c on POD1  Implants: Depuy revision knee set TC 3 size 450 mm insert and MBT revision sleeve 29 mm with a size 4 MBT tibial tray, PFC Sigma femoral adapter 05 with +2 offset TC 3 for left Sigma femoral component with 29 mm sleeve 75 x 10 mm tibial sleeve articular tibial stem 1 15 x 12 femoral stem distal augments 4 mm medial and lateral posterior lateral 8 mm posterior augment and 4 mm medial posterior augment components were cemented with a 38 mm oval dome patella cemented   He was given perioperative antibiotics:  Anti-infectives    Start     Dose/Rate Route Frequency Ordered Stop   06/20/15 1400  ceFAZolin (ANCEF) IVPB 2 g/50 mL premix     2 g 100 mL/hr over 30 Minutes Intravenous 3 times per day 06/20/15 1254 06/22/15 0607   06/20/15 0832  gentamicin (GARAMYCIN) injection  Status:  Discontinued       As needed 06/20/15 0833 06/20/15 1220   06/20/15 0600  ceFAZolin (ANCEF) IVPB 2  g/50 mL premix  Status:  Discontinued     2 g 100 mL/hr over 30 Minutes Intravenous  Once June 29, 2015 0558 2015-06-29 1254    .  He was given sequential compression devices, early ambulation, and Eliquis for DVT prophylaxis.  He benefited maximally from the hospital stay and there were no complications.    Recent vital signs:  Filed Vitals:   06/23/15 0743  BP: 125/72  Pulse: 63  Temp: 98.7 F (37.1 C)  Resp: 16    Recent laboratory studies:  Lab Results  Component Value Date   HGB 11.4* 06/23/2015    HGB 11.5* 06/22/2015   HGB 11.9* 06/21/2015   Lab Results  Component Value Date   WBC 8.7 06/23/2015   PLT 108* 06/23/2015   Lab Results  Component Value Date   INR 0.97 06/06/2015   Lab Results  Component Value Date   NA 137 06/23/2015   K 4.2 06/23/2015   CL 101 06/23/2015   CO2 27 06/23/2015   BUN 19 06/23/2015   CREATININE 0.88 06/23/2015   GLUCOSE 134* 06/23/2015    Discharge Medications:     Medication List    TAKE these medications        apixaban 5 MG Tabs tablet  Commonly known as:  ELIQUIS  Take 1 tablet (5 mg total) by mouth 2 (two) times daily.     atorvastatin 10 MG tablet  Commonly known as:  LIPITOR  Take 20 mg by mouth daily.     bisoprolol 5 MG tablet  Commonly known as:  ZEBETA  Take 1 tablet (5 mg total) by mouth daily.     dutasteride 0.5 MG capsule  Commonly known as:  AVODART  Take 0.5 mg by mouth 3 (three) times a week.     latanoprost 0.005 % ophthalmic solution  Commonly known as:  XALATAN  Place 1 drop into both eyes at bedtime.     lisinopril 20 MG tablet  Commonly known as:  PRINIVIL,ZESTRIL  Take 20 mg by mouth daily.     meloxicam 15 MG tablet  Commonly known as:  MOBIC  Take 15 mg by mouth daily.     multivitamin tablet  Take 1 tablet by mouth daily.     OSCAL 500/200 D-3 500-200 MG-UNIT per tablet  Generic drug:  calcium-vitamin D  Take 1 tablet by mouth daily.     ranitidine 75 MG tablet  Commonly known as:  ZANTAC  Take 150 mg by mouth at bedtime.     traMADol 50 MG tablet  Commonly known as:  ULTRAM  Take 1-2 tablets (50-100 mg total) by mouth every 6 (six) hours as needed for moderate pain.     traZODone 100 MG tablet  Commonly known as:  DESYREL  Take 100 mg by mouth at bedtime.     zolpidem 5 MG tablet  Commonly known as:  AMBIEN  Take 10 mg by mouth at bedtime as needed for sleep.        Diagnostic Studies: Dg Knee 1-2 Views Left  29-Jun-2015   CLINICAL DATA:  Status post total knee  arthroplasty  EXAM: LEFT KNEE - 1-2 VIEW  COMPARISON:  CT left knee January 15, 2015  FINDINGS: Frontal and lateral views obtained. Patient is status post total knee replacement with prosthetic components well-seated. There is a lucency in the medial distal femoral metaphysis concerning for fracture. No other fracture apparent. No dislocation. No appreciable joint effusion. There are, however, multiple calcifications in  the suprapatellar bursa.  IMPRESSION: Suspect fracture along the medial aspect of the distal femoral metaphysis. Prosthetic components appear well seated. Multiple calcifications in the suprapatellar bursa.  These results will be called to the ordering clinician or representative by the Radiologist Assistant, and communication documented in the PACS or zVision Dashboard.   Electronically Signed   By: Lowella Grip III M.D.   On: 06/20/2015 13:28    Disposition: 01-Home or Self Care   Pt has benefited from his hospitalization and is stable and is ready for discharge pending a bowel movement.        Follow-up Information    Follow up with MENZ,MICHAEL, MD In 2 weeks.   Specialty:  Orthopedic Surgery   Why:  For wound re-check and staple removal   Contact information:   [place] [address] [phone]        Signed: Judson Roch PA-C 06/23/2015, 8:58 AM

## 2015-06-23 NOTE — Care Management Note (Signed)
Case Management Note  Patient Details  Name: Cire Clute. MRN: [id number] Date of Birth: [birth year]  Subjective/Objective:        Home Health PT with [facility] initiated by Marshell Garfinkel. Mr Pisarski spoke with [facility] about delivery of a hospital bed to his home yesterday and bed is to be delivered to the home today. Has a rolling walker and refused a bedside commode stating that his bathroom was only a few steps away from his bed. Mrs Tullier is at home this morning awaiting the delivery of a hospital bed from [facility]. Per case manager Angela's note, Mr Meester is already on Eliquis from another recent knee surgery.             Action/Plan:   Expected Discharge Date:                  Expected Discharge Plan:     In-House Referral:  Clinical Social Work  Discharge planning Services  CM Consult  Post Acute Care Choice:    Choice offered to:  Patient  DME Arranged:    DME Agency:     HH Arranged:  PT [place]:  [place]  Status of Service:     Medicare Important Message Given:  Yes-second notification given Date Medicare IM Given:    Medicare IM give by:    Date Additional Medicare IM Given:    Additional Medicare Important Message give by:     If discussed at [place] of Stay Meetings, dates discussed:    Additional Comments:  Derrall Hicks A, RN 06/23/2015, 8:58 AM

## 2015-06-23 NOTE — Progress Notes (Signed)
Physical Therapy Treatment Patient Details Name: [name]. MRN: [id number] DOB: 03-04-1941 Today's Date: 06/23/2015    History of Present Illness Pt: 74yo white male, Hx of L TKA 10ya, c gradudally worsening function and swelling in last 3 years. Pt is very active, walking 3-5 miles daily. Pt presents today s/p revisition of said TKA, which he underwent yesterday.     PT Comments    Pt will be up in chair with brace on and ready to go after completing morning session.  Has plan for get home with HHPT and DME will be not needed.  Follow Up Recommendations  Home health PT     Equipment Recommendations  Rolling walker with 5" wheels;Other (comment)    Recommendations for Other Services       Precautions / Restrictions Precautions Precautions: Knee;Fall Precaution Booklet Issued: No Restrictions Weight Bearing Restrictions: Yes LLE Weight Bearing: Weight bearing as tolerated    Mobility  Bed Mobility               General bed mobility comments: up when PT entered  Transfers Overall transfer level: Needs assistance Equipment used: Rolling walker (2 wheeled);1 person hand held assist Transfers: Sit to/from [employer] Sit to Stand: Supervision Stand pivot transfers: Supervision       General transfer comment: improved sitting control  Ambulation/Gait Ambulation/Gait assistance: Supervision Ambulation Distance (Feet): 300 Feet Assistive device: Rolling walker (2 wheeled) Gait Pattern/deviations: Step-through pattern;Narrow base of support;Trunk flexed;Decreased weight shift to left Gait velocity: reduced Gait velocity interpretation: Below normal speed for age/gender General Gait Details: feeling unsteadiness on LLE but controlling   Stairs Stairs: Yes Stairs assistance: Min guard Stair Management: One rail Right;Two rails Number of Stairs: 8 General stair comments: used both one and two rails to endsure he can get in and manage  safely at home  Wheelchair Mobility    Modified Rankin (Stroke Patients Only)       Balance                                    Cognition Arousal/Alertness: Awake/alert Behavior During Therapy: WFL for tasks assessed/performed Overall Cognitive Status: Within Functional Limits for tasks assessed                      Exercises      General Comments        Pertinent Vitals/Pain Pain Assessment: Faces Pain Score: 2  Faces Pain Scale: Hurts a little bit Pain Location: L knee  Pain Intervention(s): Limited activity within patient's tolerance;Monitored during session;Premedicated before session;Repositioned;Ice applied    Home Living                      Prior Function            PT Goals (current goals can now be found in the care plan section) Acute Rehab PT Goals Patient Stated Goal: Home with wife Progress towards PT goals: Progressing toward goals    Frequency  BID    PT Plan Current plan remains appropriate    Co-evaluation             End of Session Equipment Utilized During Treatment: Gait belt Activity Tolerance: Patient tolerated treatment well Patient left: in chair;with call bell/phone within reach;with chair alarm set     Time: 7517-0017 PT Time Calculation (min) (ACUTE ONLY): 29 min  Charges:  $Gait Training: 23-37 mins                    G Codes:      Ramond Dial 06-30-15, 11:47 AM   Mee Hives, PT MS Acute Rehab Dept. Number: [facility] [id number] and [place] [phone]

## 2015-06-23 NOTE — Progress Notes (Signed)
Subjective: 3 Days Post-Op Procedure(s) (LRB): TOTAL KNEE REVISION (Left) Patient reports pain as mild.   Patient is well, but has had some minor complaints of constipation Plan is to go home with homehealth PT after hospital stay. Negative for chest pain and shortness of breath Fever: no Gastrointestinal:Negative for nausea and vomiting  Objective: Vital signs in last 24 hours: Temp:  [98.6 F (37 C)-98.9 F (37.2 C)] 98.7 F (37.1 C) (07/03 0743) Pulse Rate:  [59-63] 63 (07/03 0743) Resp:  [16-18] 16 (07/03 0743) BP: (125-137)/(63-82) 125/72 mmHg (07/03 0743) SpO2:  [95 %-98 %] 96 % (07/03 0743)  Intake/Output from previous day:  Intake/Output Summary (Last 24 hours) at 06/23/15 0819 Last data filed at 06/23/15 0711  Gross per 24 hour  Intake    720 ml  Output   1450 ml  Net   -730 ml    Intake/Output this shift: Total I/O In: 120 [P.O.:120] Out: 400 [Urine:400]  Labs:  Recent Labs  06/20/15 1443 06/21/15 0559 06/22/15 0352 06/23/15 0346  HGB 13.2 11.9* 11.5* 11.4*    Recent Labs  06/22/15 0352 06/23/15 0346  WBC 8.7 8.7  RBC 3.81* 3.71*  HCT 34.7* 33.7*  PLT 108* 108*    Recent Labs  06/22/15 0352 06/23/15 0346  NA 137 137  K 4.2 4.2  CL 103 101  CO2 29 27  BUN 17 19  CREATININE 0.93 0.88  GLUCOSE 156* 134*  CALCIUM 8.7* 9.0   No results for input(s): LABPT, INR in the last 72 hours.   EXAM General - Patient is Alert, Appropriate and Oriented Extremity - Neurologically intact Neurovascular intact Sensation intact distally Dorsiflexion/Plantar flexion intact Incision: scant drainage No cellulitis present Dressing/Incision - blood tinged drainage Motor Function - intact, moving foot and toes well on exam.   Abdomen is distended with tympany.  He has normal bowel sounds on exam.  Past Medical History  Diagnosis Date  . ischemic cardiomyopathy     stent to a 95%-occluded LAD in 1995; catheterization in 2012 demonstrated patent  grafts and nonobstructive disease  . Syncope        . Cardiac arrest   . Biventricular defibrillator-Medtronic     Generator replacement 2012 with CRT upgrade  . Severe sinus bradycardia   . High-grade heart block   . TIA (transient ischemic attack) nov. 2011  . GERD (gastroesophageal reflux disease)   . Allergic rhinitis   . Dyspnea     PFT 12/19/10: FEV1 2.93(107%), FEV1% 72, TLC 8.02(138%), DLCO 110%, +BD  . Sciatic nerve pain   . GERD (gastroesophageal reflux disease)   . Hypertension   . Hyperlipidemia   . BPH (benign prostatic hyperplasia)   . ED (erectile dysfunction)   . Asthma   . AICD (automatic cardioverter/defibrillator) present   . Arthritis   . Myocardial infarction     Assessment/Plan: 3 Days Post-Op Procedure(s) (LRB): TOTAL KNEE REVISION (Left) Active Problems:   Failed total knee arthroplasty  Estimated body mass index is 29.75 kg/(m^2) as calculated from the following:   Height as of this encounter: 5\' 7"  (1.702 m).   Weight as of this encounter: 86.183 kg (190 lb). Up with therapy Discharge home with home health pending BM. Pt reports that his pain is minimum. Abdomen is distended with tympany.  Abdominal sounds are normal.  Encouraged slow PO intake.  He has tried several items in an attempt to have a BM. Next step will be suppository with morning medications.  Pt is  stable and is ready for discharge pending PT session today and BM.  DVT Prophylaxis - Foot Pumps, TED hose and Eliquis. Weight-Bearing as tolerated to left leg  J. Cameron Proud, PA-C [facility] Orthopaedic Surgery 06/23/2015, 8:19 AM

## 2015-06-26 ENCOUNTER — Encounter: Payer: Self-pay | Admitting: Internal Medicine

## 2015-06-28 ENCOUNTER — Encounter: Payer: Self-pay | Admitting: Internal Medicine

## 2015-07-01 LAB — SURGICAL PATHOLOGY

## 2015-08-27 ENCOUNTER — Telehealth: Payer: Self-pay | Admitting: Cardiology

## 2015-08-27 ENCOUNTER — Ambulatory Visit (INDEPENDENT_AMBULATORY_CARE_PROVIDER_SITE_OTHER): Payer: Medicare Other | Admitting: *Deleted

## 2015-08-27 DIAGNOSIS — I5022 Chronic systolic (congestive) heart failure: Secondary | ICD-10-CM | POA: Diagnosis not present

## 2015-08-27 DIAGNOSIS — I255 Ischemic cardiomyopathy: Secondary | ICD-10-CM

## 2015-08-27 NOTE — Telephone Encounter (Signed)
LMOVM reminding pt to send remote transmission.   

## 2015-08-28 NOTE — Progress Notes (Signed)
Remote ICD transmission.   

## 2015-08-29 ENCOUNTER — Other Ambulatory Visit: Payer: Self-pay | Admitting: Internal Medicine

## 2015-09-06 LAB — CUP PACEART REMOTE DEVICE CHECK
Battery Voltage: 2.71 V
Brady Statistic AP VP Percent: 99.9 %
Brady Statistic AP VS Percent: 0.1 % — CL
Brady Statistic AS VP Percent: 0.1 % — CL
Brady Statistic AS VS Percent: 0.1 % — CL
Date Time Interrogation Session: 20160916093716
Lead Channel Impedance Value: 380 Ohm
Lead Channel Impedance Value: 380 Ohm
Lead Channel Impedance Value: 437 Ohm
Lead Channel Pacing Threshold Amplitude: 0.875 V
Lead Channel Sensing Intrinsic Amplitude: 1.5 mV
Lead Channel Setting Pacing Amplitude: 1.5 V
Lead Channel Setting Pacing Amplitude: 2 V
Lead Channel Setting Pacing Pulse Width: 0.4 ms
Lead Channel Setting Pacing Pulse Width: 0.8 ms
MDC IDC MSMT LEADCHNL RV PACING THRESHOLD PULSEWIDTH: 0.4 ms
MDC IDC MSMT LEADCHNL RV SENSING INTR AMPL: 3.3 mV
MDC IDC SET LEADCHNL RV PACING AMPLITUDE: 2 V
MDC IDC SET LEADCHNL RV SENSING SENSITIVITY: 0.3 mV
MDC IDC SET ZONE DETECTION INTERVAL: 340 ms
Zone Setting Detection Interval: 300 ms
Zone Setting Detection Interval: 350 ms
Zone Setting Detection Interval: 400 ms

## 2015-10-02 ENCOUNTER — Encounter: Payer: Self-pay | Admitting: Cardiology

## 2015-10-14 ENCOUNTER — Encounter: Payer: Self-pay | Admitting: Internal Medicine

## 2015-10-31 ENCOUNTER — Encounter: Payer: Medicare Other | Admitting: Internal Medicine

## 2015-11-04 NOTE — Progress Notes (Signed)
Patient Care Team: Derinda Late, MD as PCP - General (Family Medicine) Rocco Serene, MD (Unknown Physician Specialty)   HPI  [name]. is a 74 y.o. male Seen in followup for ICD implantation initially for syncope in the setting of depressed left ventricular function. He had a 6949-lead. He reached ERI summer 2012 and underwent device explantation with new defibrillator lead insertion with left ventricular lead placement and generator replacement.  He had been having problems with shortness of breath. He was 100% ventricularly paced. He had undergone CPX testing with a VO2 max of 22.4.  He has a history of ischemic cardiomyopathy with prior stenting of an LAD. Catheterization 2012 demonstrated a patent stent and nonobstructive coronary disease. At that the time ejection fraction was 40%; He underwent stenting in [place] around 2007; Plavix was stopped 2014  Repeat Echo 12/15 Normal LV function   Labs reviewed 6/16 normal BMET and LDL 104   He was noted  to have atrial fibrillation.  His thromboembolic risk profile is notable for TIA, hypertension, age , vascular disease and heart failure. His CHADS-2 score his 4 and his CHADS-VASc score is 6  Is on NOAC    He underwent knee replacement replacement surgery over the summer. He has more than 90 of flexion. He is almost ready to jig  at Christmas  Past Medical History  Diagnosis Date  . ischemic cardiomyopathy     stent to a 95%-occluded LAD in 1995; catheterization in 2012 demonstrated patent grafts and nonobstructive disease  . Syncope        . Cardiac arrest ([place])   . Biventricular defibrillator-Medtronic     Generator replacement 2012 with CRT upgrade  . Severe sinus bradycardia   . High-grade heart block   . TIA (transient ischemic attack) nov. 2011  . GERD (gastroesophageal reflux disease)   . Allergic rhinitis   . Dyspnea     PFT 12/19/10: FEV1 2.93(107%), FEV1% 72, TLC 8.02(138%), DLCO 110%, +BD  . Sciatic  nerve pain   . GERD (gastroesophageal reflux disease)   . Hypertension   . Hyperlipidemia   . BPH (benign prostatic hyperplasia)   . ED (erectile dysfunction)   . Asthma   . AICD (automatic cardioverter/defibrillator) present   . Arthritis   . Myocardial infarction [facility])     Past Surgical History  Procedure Laterality Date  . Implantation of medtronic dual-chamber cardioverter    . Tonsillectomy  1948  . Left knee replacement  2006  . Cataract extraction, bilateral    . Prostate biopsy    . Coronary angioplasty    . Eye surgery Bilateral     cataract extraction  . Joint replacement Left     total knee replacement  . Total knee revision Left 06/20/2015    Procedure: TOTAL KNEE REVISION;  Surgeon: [name], MD;  Location: [facility] ORS;  Service: Orthopedics;  Laterality: Left;    Current Outpatient Prescriptions  Medication Sig Dispense Refill  . atorvastatin (LIPITOR) 10 MG tablet Take 20 mg by mouth daily.     . bisoprolol (ZEBETA) 5 MG tablet Take 1 tablet (5 mg total) by mouth daily. 30 tablet 6  . calcium-vitamin D (OSCAL 500/200 D-3) 500-200 MG-UNIT per tablet Take 1 tablet by mouth daily.      [name] dutasteride (AVODART) 0.5 MG capsule Take 0.5 mg by mouth 3 (three) times a week.      [name] ELIQUIS 5 MG TABS tablet TAKE ONE TABLET BY MOUTH TWICE  DAILY 180 tablet 3  . latanoprost (XALATAN) 0.005 % ophthalmic solution Place 1 drop into both eyes at bedtime.     [name] losartan (COZAAR) 50 MG tablet Take 50 mg by mouth daily.    . meloxicam (MOBIC) 15 MG tablet Take 15 mg by mouth daily.    . Multiple Vitamin (MULTIVITAMIN) tablet Take 1 tablet by mouth daily.      . ranitidine (ZANTAC) 75 MG tablet Take 150 mg by mouth at bedtime.     . traZODone (DESYREL) 100 MG tablet Take 100 mg by mouth at bedtime.     [name] zolpidem (AMBIEN) 5 MG tablet Take 10 mg by mouth at bedtime as needed for sleep.      No current facility-administered medications for this visit.    Allergies  Allergen  Reactions  . Beta Adrenergic Blockers Other (See Comments)    Fatigue  . Oxycodone Nausea And Vomiting  . Sulfonamide Derivatives Nausea And Vomiting    Review of Systems negative except from HPI and PMH  Physical Exam BP 132/82 mmHg  Pulse 64  Ht 5\' 6"  (1.676 m)  Wt 195 lb 8 oz (88.678 kg)  BMI 31.57 kg/m2 Well developed and well nourished in no acute distress HENT normal E scleral and icterus clear Neck Supple JVP flat; carotids brisk and full Clear to ausculation Device pocket well healed; without hematoma or erythema.  There is no tethering r.egular rate and rhythm, no murmurs gallops or rub Soft with active bowel sounds No clubbing cyanosis no  Edema Alert and oriented, grossly normal motor and sensory function Skin Warm and Dry  ECG demonstrates AV biventricular pacing   Assessment and  Plan \ Ischemic Cardiomyopathy  Complete heart block  Progressive now device dependent  Implantable defibrillator Medtronic The patient's device was interrogated.  The information was reviewed. No changes were made in the programming.    Atrial fibrillation  Hypertension  HFrEF  Euvolemic    With normalization of LV function, we will continue beta blockers and ARB's but will not use Entresto or Aldactone  Without symptoms of ischemia  Recurrent afib  Continue NOAC   Cholesterol was 107-LDL. We will increase atorvastatin 20--80.  Device is approaching ERI see him in 1 months time

## 2015-11-05 ENCOUNTER — Encounter: Payer: Self-pay | Admitting: Internal Medicine

## 2015-11-05 ENCOUNTER — Ambulatory Visit (INDEPENDENT_AMBULATORY_CARE_PROVIDER_SITE_OTHER): Payer: Medicare Other | Admitting: Internal Medicine

## 2015-11-05 VITALS — BP 132/82 | HR 64 | Ht 66.0 in | Wt 195.5 lb

## 2015-11-05 DIAGNOSIS — I5022 Chronic systolic (congestive) heart failure: Secondary | ICD-10-CM

## 2015-11-05 DIAGNOSIS — I4891 Unspecified atrial fibrillation: Secondary | ICD-10-CM | POA: Diagnosis not present

## 2015-11-05 DIAGNOSIS — I255 Ischemic cardiomyopathy: Secondary | ICD-10-CM

## 2015-11-05 DIAGNOSIS — Z95810 Presence of automatic (implantable) cardiac defibrillator: Secondary | ICD-10-CM | POA: Diagnosis not present

## 2015-11-05 LAB — CUP PACEART INCLINIC DEVICE CHECK
Date Time Interrogation Session: 20161115142555
Implantable Lead Implant Date: 20070615
Implantable Lead Implant Date: 20120813
Implantable Lead Location: 753858
Implantable Lead Location: 753859
Implantable Lead Model: 4396
Implantable Lead Model: 5076
MDC IDC LEAD IMPLANT DT: 20120813
MDC IDC LEAD LOCATION: 753860
MDC IDC LEAD MODEL: 7121

## 2015-11-05 MED ORDER — ATORVASTATIN CALCIUM 80 MG PO TABS: 80.0000 mg | ORAL_TABLET | Freq: Every day | ORAL | Status: DC

## 2015-11-05 NOTE — Patient Instructions (Signed)
Medication Instructions: - Increase lipitor to 80 mg once daily  Labwork: - none  Procedures/Testing: - none  Follow-Up: - Remote monitoring is used to monitor your Pacemaker of ICD from home. This monitoring reduces the number of office visits required to check your device to one time per year. It allows [place] to keep an eye on the functioning of your device to ensure it is working properly. You are scheduled for a device check from home on 12/03/15- battery check only. You may send your transmission at any time that day. If you have a wireless device, the transmission will be sent automatically. After your physician reviews your transmission, you will receive a postcard with your next transmission date.  Any Additional Special Instructions Will Be Listed Below (If Applicable). - none

## 2015-12-03 ENCOUNTER — Ambulatory Visit (INDEPENDENT_AMBULATORY_CARE_PROVIDER_SITE_OTHER): Payer: Medicare Other | Admitting: *Deleted

## 2015-12-03 ENCOUNTER — Telehealth: Payer: Self-pay | Admitting: Cardiology

## 2015-12-03 DIAGNOSIS — Z95810 Presence of automatic (implantable) cardiac defibrillator: Secondary | ICD-10-CM

## 2015-12-03 NOTE — Telephone Encounter (Signed)
Spoke with pt and reminded pt of remote transmission that is due today. Pt verbalized understanding.   

## 2015-12-03 NOTE — Progress Notes (Signed)
Remote ICD transmission.   

## 2015-12-06 DIAGNOSIS — E119 Type 2 diabetes mellitus without complications: Secondary | ICD-10-CM | POA: Insufficient documentation

## 2015-12-07 LAB — CUP PACEART REMOTE DEVICE CHECK
Battery Voltage: 2.64 V
Brady Statistic AP VP Percent: 99.96 %
Brady Statistic AS VP Percent: 0.04 %
Brady Statistic AS VS Percent: 0 %
Brady Statistic RA Percent Paced: 99.96 %
Date Time Interrogation Session: 20161213180820
HIGH POWER IMPEDANCE MEASURED VALUE: 266 Ohm
HIGH POWER IMPEDANCE MEASURED VALUE: 40 Ohm
HighPow Impedance: 49 Ohm
Implantable Lead Implant Date: 20070615
Implantable Lead Implant Date: 20120813
Implantable Lead Location: 753858
Implantable Lead Location: 753859
Implantable Lead Location: 753860
Implantable Lead Model: 4396
Implantable Lead Model: 7121
Lead Channel Impedance Value: 342 Ohm
Lead Channel Impedance Value: 380 Ohm
Lead Channel Impedance Value: 570 Ohm
Lead Channel Impedance Value: 836 Ohm
Lead Channel Pacing Threshold Amplitude: 0.875 V
Lead Channel Pacing Threshold Amplitude: 1.875 V
Lead Channel Pacing Threshold Pulse Width: 0.4 ms
Lead Channel Sensing Intrinsic Amplitude: 0.875 mV
Lead Channel Sensing Intrinsic Amplitude: 3.25 mV
Lead Channel Setting Pacing Amplitude: 2 V
Lead Channel Setting Pacing Pulse Width: 0.8 ms
Lead Channel Setting Sensing Sensitivity: 0.3 mV
MDC IDC LEAD IMPLANT DT: 20120813
MDC IDC MSMT LEADCHNL LV IMPEDANCE VALUE: 399 Ohm
MDC IDC MSMT LEADCHNL LV PACING THRESHOLD PULSEWIDTH: 0.4 ms
MDC IDC MSMT LEADCHNL RA SENSING INTR AMPL: 0.875 mV
MDC IDC MSMT LEADCHNL RV SENSING INTR AMPL: 3.25 mV
MDC IDC SET LEADCHNL LV PACING AMPLITUDE: 1.5 V
MDC IDC SET LEADCHNL RV PACING AMPLITUDE: 2 V
MDC IDC SET LEADCHNL RV PACING PULSEWIDTH: 0.4 ms
MDC IDC STAT BRADY AP VS PERCENT: 0 %
MDC IDC STAT BRADY RV PERCENT PACED: 100 %

## 2015-12-12 ENCOUNTER — Encounter: Payer: Self-pay | Admitting: *Deleted

## 2016-01-03 ENCOUNTER — Encounter: Payer: Medicare Other | Admitting: *Deleted

## 2016-01-06 ENCOUNTER — Encounter: Payer: Self-pay | Admitting: Cardiology

## 2016-02-04 ENCOUNTER — Ambulatory Visit (INDEPENDENT_AMBULATORY_CARE_PROVIDER_SITE_OTHER): Payer: Medicare Other | Admitting: *Deleted

## 2016-02-04 DIAGNOSIS — Z95810 Presence of automatic (implantable) cardiac defibrillator: Secondary | ICD-10-CM | POA: Diagnosis not present

## 2016-02-04 DIAGNOSIS — I255 Ischemic cardiomyopathy: Secondary | ICD-10-CM

## 2016-02-04 DIAGNOSIS — I5022 Chronic systolic (congestive) heart failure: Secondary | ICD-10-CM

## 2016-02-04 NOTE — Progress Notes (Signed)
Remote ICD transmission.   

## 2016-02-05 ENCOUNTER — Encounter: Payer: Self-pay | Admitting: Cardiology

## 2016-02-05 LAB — CUP PACEART REMOTE DEVICE CHECK
Brady Statistic AP VS Percent: 0 %
Brady Statistic AS VP Percent: 0.05 %
Brady Statistic AS VS Percent: 0 %
Date Time Interrogation Session: 20170213155309
HIGH POWER IMPEDANCE MEASURED VALUE: 247 Ohm
HIGH POWER IMPEDANCE MEASURED VALUE: 39 Ohm
HIGH POWER IMPEDANCE MEASURED VALUE: 46 Ohm
Implantable Lead Implant Date: 20070615
Implantable Lead Implant Date: 20120813
Implantable Lead Location: 753859
Implantable Lead Model: 7121
Lead Channel Impedance Value: 323 Ohm
Lead Channel Impedance Value: 342 Ohm
Lead Channel Impedance Value: 380 Ohm
Lead Channel Impedance Value: 532 Ohm
Lead Channel Impedance Value: 817 Ohm
Lead Channel Pacing Threshold Amplitude: 1.875 V
Lead Channel Pacing Threshold Pulse Width: 0.4 ms
Lead Channel Sensing Intrinsic Amplitude: 0.875 mV
Lead Channel Sensing Intrinsic Amplitude: 0.875 mV
Lead Channel Sensing Intrinsic Amplitude: 3.25 mV
Lead Channel Sensing Intrinsic Amplitude: 3.25 mV
Lead Channel Setting Pacing Amplitude: 1.5 V
Lead Channel Setting Pacing Amplitude: 2 V
Lead Channel Setting Pacing Pulse Width: 0.4 ms
Lead Channel Setting Sensing Sensitivity: 0.3 mV
MDC IDC LEAD IMPLANT DT: 20120813
MDC IDC LEAD LOCATION: 753858
MDC IDC LEAD LOCATION: 753860
MDC IDC LEAD MODEL: 4396
MDC IDC MSMT BATTERY VOLTAGE: 2.62 V
MDC IDC MSMT LEADCHNL RV PACING THRESHOLD AMPLITUDE: 0.75 V
MDC IDC MSMT LEADCHNL RV PACING THRESHOLD PULSEWIDTH: 0.4 ms
MDC IDC SET LEADCHNL LV PACING PULSEWIDTH: 0.8 ms
MDC IDC SET LEADCHNL RV PACING AMPLITUDE: 2 V
MDC IDC STAT BRADY AP VP PERCENT: 99.95 %
MDC IDC STAT BRADY RA PERCENT PACED: 99.95 %
MDC IDC STAT BRADY RV PERCENT PACED: 100 %

## 2016-02-14 ENCOUNTER — Telehealth: Payer: Self-pay | Admitting: Cardiology

## 2016-02-14 NOTE — Telephone Encounter (Signed)
Spoke w/ pt and informed him that his device did reach ERI. Informed him that the alarm will go off every day at the same time for the next 16 days; if he wanted to come into the office and have the alarm turned off before his appt he could do that. Pt stated that it wouldn't bother him to leave it on. He agreed to appt w/ AS, NP on 03-05-16 at 9:40 AM.

## 2016-02-14 NOTE — Telephone Encounter (Signed)
Spoke w/ pt and he said his implanted device alarmed. He is going to send remote transmission.

## 2016-03-04 NOTE — Progress Notes (Signed)
Electrophysiology Office Note Date: 03/05/2016  ID:  Ronnie Chimes., DOB 18-Oct-1941, MRN [id number]  PCP: Ronnie Fennel, MD Electrophysiologist: [name]  CC: ICD at [place]. is a 76 y.o. male is seen today for Dr [name]. He has a history of ischemic cardiomyopathy, CAD, chronic systolic heart failure s/p CRT upgrade with normalization of LV function by echo 11/2014, and paroxysmal atrial fibrillation (on Eliquis). Recent remote transmission demonstrated that his ICD was at [facility]  He presents today to discuss generator change.  Since last being seen in our clinic, the patient reports doing very well. He denies chest pain, palpitations, dyspnea, PND, orthopnea, nausea, vomiting, dizziness, syncope, edema, weight gain, or early satiety. He has not had ICD shocks.   Cardiac catheterization 2012 demonstrated EF 40-45%, patent LAD stent, normal right sided pressures with normal cardiac output. Echocardiogram 11/2014 demonstrated EF 50-55%, no RWMA, mild AR, LA 38.  Device History: MDT dual chamber ICD implanted 2007 for ischemic cardiomyopathy; upgrade to CRTD 2012 with explant of previously implanted 6949 lead and implant of new RV lead. History of appropriate therapy: No History of AAD therapy: No   Past Medical History  Diagnosis Date  . ischemic cardiomyopathy     stent to a 95%-occluded LAD in 1995; catheterization in 2012 demonstrated patent grafts and nonobstructive disease  . Syncope        . Cardiac arrest ([place])   . Biventricular defibrillator-Medtronic     Generator replacement 2012 with CRT upgrade  . Severe sinus bradycardia   . High-grade heart block   . TIA (transient ischemic attack) nov. 2011  . GERD (gastroesophageal reflux disease)   . Allergic rhinitis   . Dyspnea     PFT 12/19/10: FEV1 2.93(107%), FEV1% 72, TLC 8.02(138%), DLCO 110%, +BD  . Sciatic nerve pain   . GERD (gastroesophageal reflux disease)   . Hypertension   .  Hyperlipidemia   . BPH (benign prostatic hyperplasia)   . ED (erectile dysfunction)   . Asthma   . AICD (automatic cardioverter/defibrillator) present   . Arthritis   . Myocardial infarction [facility])    Past Surgical History  Procedure Laterality Date  . Implantation of medtronic dual-chamber cardioverter    . Tonsillectomy  1948  . Left knee replacement  2006  . Cataract extraction, bilateral    . Prostate biopsy    . Coronary angioplasty    . Eye surgery Bilateral     cataract extraction  . Joint replacement Left     total knee replacement  . Total knee revision Left 06/20/2015    Procedure: TOTAL KNEE REVISION;  Surgeon: [name], MD;  Location: [facility] ORS;  Service: Orthopedics;  Laterality: Left;    Current Outpatient Prescriptions  Medication Sig Dispense Refill  . amoxicillin (AMOXIL) 500 MG capsule Take 500 mg by mouth 2 (two) times daily. DENTIST APPT.    [name] atorvastatin (LIPITOR) 80 MG tablet Take 1 tablet (80 mg total) by mouth daily. 90 tablet 3  . bisoprolol (ZEBETA) 5 MG tablet Take 1 tablet (5 mg total) by mouth daily. 30 tablet 6  . calcium-vitamin D (OSCAL 500/200 D-3) 500-200 MG-UNIT per tablet Take 1 tablet by mouth daily.      [name] dutasteride (AVODART) 0.5 MG capsule Take 0.5 mg by mouth 3 (three) times a week.      [name] ELIQUIS 5 MG TABS tablet TAKE ONE TABLET BY MOUTH TWICE DAILY 180 tablet 3  . latanoprost (  XALATAN) 0.005 % ophthalmic solution Place 1 drop into both eyes at bedtime.     [name] losartan (COZAAR) 50 MG tablet Take 50 mg by mouth daily.    . meloxicam (MOBIC) 15 MG tablet Take 15 mg by mouth daily.    . Multiple Vitamin (MULTIVITAMIN) tablet Take 1 tablet by mouth daily.      . ranitidine (ZANTAC) 75 MG tablet Take 150 mg by mouth at bedtime.     . traZODone (DESYREL) 100 MG tablet Take 100 mg by mouth at bedtime.     [name] zolpidem (AMBIEN) 5 MG tablet Take 10 mg by mouth at bedtime as needed for sleep.      No current facility-administered medications for  this visit.    Allergies:   Beta adrenergic blockers; Oxycodone; and Sulfonamide derivatives   Social History: Social History   Social History  . Marital Status: Married    Spouse Name: N/A  . Number of Children: 4  . Years of Education: N/A   Occupational History  . Retired Chief Financial Officer    Social History Main Topics  . Smoking status: Never Smoker   . Smokeless tobacco: Never Used  . Alcohol Use: Yes     Comment: 2 glasses of alcohol per day  . Drug Use: No  . Sexual Activity: Not on file   Other Topics Concern  . Not on file   Social History Narrative   Regular exercise    Family History: Family History  Problem Relation Age of Onset  . Emphysema Father   . Lung cancer Father   . Cirrhosis Mother   . Heart attack Neg Hx   . Stroke Neg Hx   . Hypertension Brother   . Hypertension Son     Review of Systems: All other systems reviewed and are otherwise negative except as noted above.   Physical Exam: VS:  BP 130/70 mmHg  Pulse 70  Ht 5\' 6"  (1.676 m)  Wt 194 lb 9.6 oz (88.27 kg)  BMI 31.42 kg/m2 , BMI Body mass index is 31.42 kg/(m^2).  GEN- The patient is well appearing, alert and oriented x 3 today.   HEENT: normocephalic, atraumatic; sclera clear, conjunctiva pink; hearing intact; oropharynx clear; neck supple Lungs- Clear to ausculation bilaterally, normal work of breathing.  No wheezes, rales, rhonchi Heart- Regular rate and rhythm (paced) GI- soft, non-tender, non-distended, bowel sounds present, no hepatosplenomegaly Extremities- no clubbing, cyanosis, or edema; DP/PT/radial pulses 2+ bilaterally MS- no significant deformity or atrophy Skin- warm and dry, no rash or lesion; ICD pocket well healed Psych- euthymic mood, full affect Neuro- strength and sensation are intact  ICD interrogation- reviewed in detail today,  See PACEART report  EKG:  EKG is ordered today. The ekg ordered today shows AV pacing   Recent Labs: 06/23/2015: BUN 19; Creatinine,  Ser 0.88; Hemoglobin 11.4*; Platelets 108*; Potassium 4.2; Sodium 137   Wt Readings from Last 3 Encounters:  03/05/16 194 lb 9.6 oz (88.27 kg)  11/05/15 195 lb 8 oz (88.678 kg)  06/20/15 190 lb (86.183 kg)     Other studies Reviewed: Additional studies/ records that were reviewed today include: Dr Olin Pia notes, last echo  Assessment and Plan:  1.  Ischemic cardiomyopathy/Chronic systolic dysfunction Normalization of EF by echo 11/2014 euvolemic today Stable on an appropriate medical regimen ICD at [facility] - pt is device dependent today  Risks, benefits to CRTD gen change discussed with the patient today who wishes to proceed. Will schedule with Dr  Klein at the next available time.   2.  Paroxysmal atrial fibrillation Continue Eliquis for CHADS2VASC of at least 4 AF burden by device interrogation today 0%  3.  HTN Stable No change required today  4.  CAD No recent ischemic symptoms Continue medical therapy  Current medicines are reviewed at length with the patient today.   The patient does not have concerns regarding his medicines.  The following changes were made today:  none  Labs/ tests ordered today include: pre-procedure labs    Disposition:   Follow up with Dr [name] following generator change.    Signed, Chanetta Marshall, NP 03/05/2016 10:33 AM  [facility] [address] [place] [address] [phone] (office) [phone] (fax)

## 2016-03-05 ENCOUNTER — Ambulatory Visit (INDEPENDENT_AMBULATORY_CARE_PROVIDER_SITE_OTHER): Payer: Medicare Other | Admitting: Nurse Practitioner

## 2016-03-05 ENCOUNTER — Encounter: Payer: Self-pay | Admitting: *Deleted

## 2016-03-05 ENCOUNTER — Encounter: Payer: Self-pay | Admitting: Nurse Practitioner

## 2016-03-05 ENCOUNTER — Encounter: Payer: Self-pay | Admitting: Internal Medicine

## 2016-03-05 VITALS — BP 130/70 | HR 70 | Ht 66.0 in | Wt 194.6 lb

## 2016-03-05 DIAGNOSIS — I5022 Chronic systolic (congestive) heart failure: Secondary | ICD-10-CM

## 2016-03-05 DIAGNOSIS — I480 Paroxysmal atrial fibrillation: Secondary | ICD-10-CM | POA: Diagnosis not present

## 2016-03-05 DIAGNOSIS — I255 Ischemic cardiomyopathy: Secondary | ICD-10-CM | POA: Diagnosis not present

## 2016-03-05 LAB — CUP PACEART INCLINIC DEVICE CHECK
Implantable Lead Implant Date: 20070615
Implantable Lead Implant Date: 20120813
Implantable Lead Location: 753858
Implantable Lead Location: 753859
Implantable Lead Model: 4396
Implantable Lead Model: 5076
MDC IDC LEAD IMPLANT DT: 20120813
MDC IDC LEAD LOCATION: 753860
MDC IDC LEAD MODEL: 7121
MDC IDC SESS DTM: 20170316130327

## 2016-03-05 NOTE — Patient Instructions (Signed)
Medication Instructions:   Your physician recommends that you continue on your current medications as directed. Please refer to the Current Medication list given to you today.   If you need a refill on your cardiac medications before your next appointment, please call your pharmacy.  Labwork:  RETURN  03/13/16.[name] FOR CBC PT/INR  BMET .[name]   Testing/Procedures:  SEE LETTER FOR  INSTRUCTIONS ICD GEN CHANGE 03/18/16   Follow-Up:                         [facility] WITH DEVICE CLINIC  AFTER 03/18/16..                       91 DAYS PHYS DEFIB CK WITH KLEIN  AFTER 03/18/16     Any Other Special Instructions Will Be Listed Below (If Applicable).

## 2016-03-12 ENCOUNTER — Other Ambulatory Visit (INDEPENDENT_AMBULATORY_CARE_PROVIDER_SITE_OTHER): Payer: Medicare Other | Admitting: *Deleted

## 2016-03-12 DIAGNOSIS — I255 Ischemic cardiomyopathy: Secondary | ICD-10-CM

## 2016-03-12 LAB — CBC
HEMATOCRIT: 38.9 % — AB (ref 39.0–52.0)
Hemoglobin: 13.5 g/dL (ref 13.0–17.0)
MCH: 30.2 pg (ref 26.0–34.0)
MCHC: 34.7 g/dL (ref 30.0–36.0)
MCV: 87 fL (ref 78.0–100.0)
MPV: 12.5 fL — AB (ref 8.6–12.4)
Platelets: 155 10*3/uL (ref 150–400)
RBC: 4.47 MIL/uL (ref 4.22–5.81)
RDW: 12.5 % (ref 11.5–15.5)
WBC: 6.8 10*3/uL (ref 4.0–10.5)

## 2016-03-12 LAB — PROTIME-INR
INR: 1.34 (ref <1.50)
Prothrombin Time: 16.7 seconds — ABNORMAL HIGH (ref 11.6–15.2)

## 2016-03-12 LAB — BASIC METABOLIC PANEL
BUN: 19 mg/dL (ref 7–25)
CHLORIDE: 107 mmol/L (ref 98–110)
CO2: 26 mmol/L (ref 20–31)
Calcium: 9.3 mg/dL (ref 8.6–10.3)
Creat: 0.89 mg/dL (ref 0.70–1.18)
GLUCOSE: 117 mg/dL — AB (ref 65–99)
Potassium: 4.2 mmol/L (ref 3.5–5.3)
SODIUM: 141 mmol/L (ref 135–146)

## 2016-03-12 NOTE — Addendum Note (Signed)
Addended by: Eulis Foster on: 03/12/2016 10:11 AM   Modules accepted: Orders

## 2016-03-13 ENCOUNTER — Telehealth: Payer: Self-pay | Admitting: *Deleted

## 2016-03-13 NOTE — Telephone Encounter (Signed)
-----   Message from Patsey Berthold, NP sent at 03/12/2016  4:19 PM EDT ----- Please notify patient of stable pre-procedure labs

## 2016-03-16 ENCOUNTER — Telehealth: Payer: Self-pay | Admitting: *Deleted

## 2016-03-16 NOTE — Telephone Encounter (Signed)
-----   Message from Patsey Berthold, NP sent at 03/12/2016  4:19 PM EDT ----- Please notify patient of stable pre-procedure labs

## 2016-03-18 ENCOUNTER — Ambulatory Visit (HOSPITAL_COMMUNITY)
Admission: RE | Admit: 2016-03-18 | Discharge: 2016-03-18 | Disposition: A | Discharge status: Home / Self Care | Payer: Medicare Other | Source: Ambulatory Visit | Attending: Internal Medicine | Admitting: Internal Medicine

## 2016-03-18 ENCOUNTER — Encounter (HOSPITAL_COMMUNITY)
Admission: RE | Disposition: A | Discharge status: Home / Self Care | Payer: Self-pay | Source: Ambulatory Visit | Attending: Internal Medicine

## 2016-03-18 ENCOUNTER — Encounter (HOSPITAL_COMMUNITY): Payer: Self-pay | Admitting: Internal Medicine

## 2016-03-18 DIAGNOSIS — Z95810 Presence of automatic (implantable) cardiac defibrillator: Secondary | ICD-10-CM | POA: Diagnosis present

## 2016-03-18 DIAGNOSIS — I110 Hypertensive heart disease with heart failure: Secondary | ICD-10-CM | POA: Diagnosis not present

## 2016-03-18 DIAGNOSIS — K219 Gastro-esophageal reflux disease without esophagitis: Secondary | ICD-10-CM | POA: Insufficient documentation

## 2016-03-18 DIAGNOSIS — N400 Enlarged prostate without lower urinary tract symptoms: Secondary | ICD-10-CM | POA: Diagnosis not present

## 2016-03-18 DIAGNOSIS — E785 Hyperlipidemia, unspecified: Secondary | ICD-10-CM | POA: Insufficient documentation

## 2016-03-18 DIAGNOSIS — I252 Old myocardial infarction: Secondary | ICD-10-CM | POA: Diagnosis not present

## 2016-03-18 DIAGNOSIS — I442 Atrioventricular block, complete: Secondary | ICD-10-CM | POA: Diagnosis not present

## 2016-03-18 DIAGNOSIS — I2510 Atherosclerotic heart disease of native coronary artery without angina pectoris: Secondary | ICD-10-CM | POA: Insufficient documentation

## 2016-03-18 DIAGNOSIS — M1990 Unspecified osteoarthritis, unspecified site: Secondary | ICD-10-CM | POA: Diagnosis not present

## 2016-03-18 DIAGNOSIS — I459 Conduction disorder, unspecified: Secondary | ICD-10-CM | POA: Diagnosis present

## 2016-03-18 DIAGNOSIS — I480 Paroxysmal atrial fibrillation: Secondary | ICD-10-CM | POA: Insufficient documentation

## 2016-03-18 DIAGNOSIS — N529 Male erectile dysfunction, unspecified: Secondary | ICD-10-CM | POA: Insufficient documentation

## 2016-03-18 DIAGNOSIS — M5416 Radiculopathy, lumbar region: Secondary | ICD-10-CM

## 2016-03-18 DIAGNOSIS — Z8673 Personal history of transient ischemic attack (TIA), and cerebral infarction without residual deficits: Secondary | ICD-10-CM | POA: Insufficient documentation

## 2016-03-18 DIAGNOSIS — J45909 Unspecified asthma, uncomplicated: Secondary | ICD-10-CM | POA: Diagnosis not present

## 2016-03-18 DIAGNOSIS — Z4502 Encounter for adjustment and management of automatic implantable cardiac defibrillator: Secondary | ICD-10-CM | POA: Insufficient documentation

## 2016-03-18 DIAGNOSIS — Z882 Allergy status to sulfonamides status: Secondary | ICD-10-CM | POA: Diagnosis not present

## 2016-03-18 DIAGNOSIS — Z885 Allergy status to narcotic agent status: Secondary | ICD-10-CM | POA: Diagnosis not present

## 2016-03-18 DIAGNOSIS — Z955 Presence of coronary angioplasty implant and graft: Secondary | ICD-10-CM | POA: Insufficient documentation

## 2016-03-18 DIAGNOSIS — Z7901 Long term (current) use of anticoagulants: Secondary | ICD-10-CM | POA: Insufficient documentation

## 2016-03-18 DIAGNOSIS — I255 Ischemic cardiomyopathy: Secondary | ICD-10-CM | POA: Diagnosis present

## 2016-03-18 DIAGNOSIS — Z8249 Family history of ischemic heart disease and other diseases of the circulatory system: Secondary | ICD-10-CM | POA: Insufficient documentation

## 2016-03-18 DIAGNOSIS — I5022 Chronic systolic (congestive) heart failure: Secondary | ICD-10-CM | POA: Diagnosis not present

## 2016-03-18 HISTORY — PX: EP IMPLANTABLE DEVICE: SHX172B

## 2016-03-18 LAB — SURGICAL PCR SCREEN
MRSA, PCR: NEGATIVE
STAPHYLOCOCCUS AUREUS: POSITIVE — AB

## 2016-03-18 SURGERY — ICD/BIV ICD GENERATOR CHANGEOUT

## 2016-03-18 MED ORDER — CEFAZOLIN SODIUM-DEXTROSE 2-3 GM-% IV SOLR
INTRAVENOUS | Status: DC | PRN
  Administered 2016-03-18: 2 g via INTRAVENOUS

## 2016-03-18 MED ORDER — SODIUM CHLORIDE 0.9 % IV SOLN
INTRAVENOUS | Status: DC
  Administered 2016-03-18: 07:00:00 via INTRAVENOUS

## 2016-03-18 MED ORDER — MIDAZOLAM HCL 5 MG/5ML IJ SOLN
INTRAMUSCULAR | Status: AC
  Filled 2016-03-18: qty 5

## 2016-03-18 MED ORDER — ACETAMINOPHEN 325 MG PO TABS
325.0000 mg | ORAL_TABLET | ORAL | Status: DC | PRN
Start: 2016-03-18 — End: 2016-03-18
  Filled 2016-03-18: qty 2

## 2016-03-18 MED ORDER — MUPIROCIN 2 % EX OINT
1.0000 "application " | TOPICAL_OINTMENT | Freq: Once | CUTANEOUS | Status: AC
  Administered 2016-03-18: 1 via TOPICAL
  Filled 2016-03-18: qty 22

## 2016-03-18 MED ORDER — CHLORHEXIDINE GLUCONATE 4 % EX LIQD
60.0000 mL | Freq: Once | CUTANEOUS | Status: DC
  Filled 2016-03-18: qty 60

## 2016-03-18 MED ORDER — FENTANYL CITRATE (PF) 100 MCG/2ML IJ SOLN
INTRAMUSCULAR | Status: DC | PRN
  Administered 2016-03-18 (×3): 25 ug via INTRAVENOUS

## 2016-03-18 MED ORDER — LIDOCAINE HCL (PF) 1 % IJ SOLN
INTRAMUSCULAR | Status: DC | PRN
  Administered 2016-03-18: 36 mL via INTRADERMAL

## 2016-03-18 MED ORDER — CEFAZOLIN SODIUM-DEXTROSE 2-4 GM/100ML-% IV SOLN
2.0000 g | INTRAVENOUS | Status: DC
  Filled 2016-03-18: qty 100

## 2016-03-18 MED ORDER — MUPIROCIN 2 % EX OINT
TOPICAL_OINTMENT | CUTANEOUS | Status: AC
  Filled 2016-03-18: qty 22

## 2016-03-18 MED ORDER — SODIUM CHLORIDE 0.9 % IR SOLN
Status: AC
  Filled 2016-03-18: qty 2

## 2016-03-18 MED ORDER — CEFAZOLIN SODIUM-DEXTROSE 2-3 GM-% IV SOLR
INTRAVENOUS | Status: AC
  Filled 2016-03-18: qty 50

## 2016-03-18 MED ORDER — SODIUM CHLORIDE 0.9 % IR SOLN
80.0000 mg | Status: AC
  Administered 2016-03-18: 80 mg
  Filled 2016-03-18: qty 2

## 2016-03-18 MED ORDER — ONDANSETRON HCL 4 MG/2ML IJ SOLN: 4.0000 mg | Freq: Four times a day (QID) | INTRAMUSCULAR | Status: DC | PRN

## 2016-03-18 MED ORDER — MIDAZOLAM HCL 5 MG/5ML IJ SOLN
INTRAMUSCULAR | Status: DC | PRN
  Administered 2016-03-18: 2 mg via INTRAVENOUS
  Administered 2016-03-18 (×2): 1 mg via INTRAVENOUS

## 2016-03-18 MED ORDER — FENTANYL CITRATE (PF) 100 MCG/2ML IJ SOLN
INTRAMUSCULAR | Status: AC
  Filled 2016-03-18: qty 2

## 2016-03-18 MED ORDER — LIDOCAINE HCL (PF) 1 % IJ SOLN
INTRAMUSCULAR | Status: AC
  Filled 2016-03-18: qty 60

## 2016-03-18 MED ORDER — SODIUM CHLORIDE 0.9 % IV SOLN: INTRAVENOUS | Status: AC

## 2016-03-18 SURGICAL SUPPLY — 9 items
CABLE SURGICAL S-101-97-12 (CABLE) ×2 IMPLANT
DEVICE DISSECT PLASMABLAD 3.0S (MISCELLANEOUS) IMPLANT
HEMOSTAT SURGICEL 2X4 FIBR (HEMOSTASIS) ×2 IMPLANT
ICD VIVA XT CRT-D DTBA1D1 (ICD Generator) ×2 IMPLANT
PAD DEFIB LIFELINK (PAD) ×2 IMPLANT
PLASMABLADE 3.0S (MISCELLANEOUS) ×3
POUCH AIGIS-R ANTIBACT ICD (Mesh General) ×3 IMPLANT
POUCH AIGIS-R ANTIBACT ICD LRG (Mesh General) IMPLANT
TRAY PACEMAKER INSERTION (PACKS) ×2 IMPLANT

## 2016-03-18 NOTE — Progress Notes (Signed)
Pt and wife instructed on how to use Mupericin.  Voice understanding of instructions.

## 2016-03-18 NOTE — Discharge Instructions (Signed)
Pacemaker Battery Change °A pacemaker battery usually lasts 4 to 12 years. Once or twice per year, you will be asked to visit your health care provider to have a full evaluation of your pacemaker. When a battery needs to be replaced, the entire pacemaker is replaced so that you can benefit from new circuitry and any new features that have been added to pacemakers. Most often, this procedure is very simple because the leads are already in place.  °There are many things that affect how long a pacemaker battery will last, including:  °· The age of the pacemaker.   °· The number of leads (1, 2, or 3).   °· The pacemaker work load. If the pacemaker is helping the heart more often, the battery will not last as long as it would if the pacemaker did not need to help the heart.   °· Power (voltage) settings.  °LET YOUR HEALTH CARE PROVIDER KNOW ABOUT:  °· Any allergies you have.   °· All medicines you are taking, including vitamins, herbs, eye drops, creams, and over-the-counter medicines.   °· Previous problems you or members of your family have had with the use of anesthetics.   °· Any blood disorders you have.   °· Previous surgeries you have had, especially since your last pacemaker placement.   °· Medical conditions you have.   °· Possibility of pregnancy, if this applies. °· Symptoms of chest pain, trouble breathing, palpitations, light-headedness, or feelings of an abnormal or irregular heartbeat. °RISKS AND COMPLICATIONS  °Generally, this is a safe procedure. However, as with any procedure, problems can occur and include:  °· Bleeding.   °· Bruising of the skin around where the incision was made.   °· Pain at the incision site.   °· Pulling apart of the skin at the incision site.   °· Infection.   °· Allergic reaction to anesthetics or other medicines used during the procedure.   °People with diabetes may have a temporary increase in their blood sugar after any surgical procedure.  °BEFORE THE PROCEDURE  °· Wash all  of the skin around the area of the chest where the pacemaker is located.   °· Ask your health care provider for help with any medicine adjustments before the pacemaker is replaced.   °· Do not eat or drink anything after midnight on the night before the procedure or as directed by your health care provider. °· Ask your health care provider if you can take a sip of water with any approved medicines the morning of the procedure. °PROCEDURE  °· After giving medicine to numb the skin (local anesthetic), your health care provider will make a cut to reopen the pocket holding the pacemaker.   °· The old pacemaker will be disconnected from its leads.   °· The leads will be tested.   °· If needed, the leads will be replaced. If the leads are functioning properly, the new pacemaker may be connected to the existing leads. °· A heart monitor and the pacemaker programmer will be used to make sure that the new pacemaker is working properly. °· The incision site will then be closed. A dressing will be placed over the pacemaker site. The dressing will be removed 24-48 hours afterward. °AFTER THE PROCEDURE  °· You will be taken to a recovery area after the new pacemaker implant is completed. Your vital signs such as blood pressure, heart rate, breathing, and oxygen levels will be monitored. °· Your health care provider will tell you when you will need to next test your pacemaker or when to return to the office for follow-up   for removal of stitches. °  °This information is not intended to replace advice given to you by your health care provider. Make sure you discuss any questions you have with your health care provider. °  °Document Released: 03/17/2007 Document Revised: 12/28/2014 Document Reviewed: 06/21/2013 °Elsevier Interactive Patient Education ©2016 [employer] ° °

## 2016-03-18 NOTE — H&P (View-Only) (Signed)
Electrophysiology Office Note Date: 03/05/2016  ID:  [name]., DOB 04-21-1941, MRN [id number]  PCP: Marcello Fennel, MD Electrophysiologist: [name]  CC: ICD at [place]. is a 75 y.o. male is seen today for Dr [name]. He has a history of ischemic cardiomyopathy, CAD, chronic systolic heart failure s/p CRT upgrade with normalization of LV function by echo 11/2014, and paroxysmal atrial fibrillation (on Eliquis). Recent remote transmission demonstrated that his ICD was at [facility].  He presents today to discuss generator change.  Since last being seen in our clinic, the patient reports doing very well. He denies chest pain, palpitations, dyspnea, PND, orthopnea, nausea, vomiting, dizziness, syncope, edema, weight gain, or early satiety. He has not had ICD shocks.   Cardiac catheterization 2012 demonstrated EF 40-45%, patent LAD stent, normal right sided pressures with normal cardiac output. Echocardiogram 11/2014 demonstrated EF 50-55%, no RWMA, mild AR, LA 38.  Device History: MDT dual chamber ICD implanted 2007 for ischemic cardiomyopathy; upgrade to CRTD 2012 with explant of previously implanted 6949 lead and implant of new RV lead. History of appropriate therapy: No History of AAD therapy: No   Past Medical History  Diagnosis Date  . ischemic cardiomyopathy     stent to a 95%-occluded LAD in 1995; catheterization in 2012 demonstrated patent grafts and nonobstructive disease  . Syncope        . Cardiac arrest ([place])   . Biventricular defibrillator-Medtronic     Generator replacement 2012 with CRT upgrade  . Severe sinus bradycardia   . High-grade heart block   . TIA (transient ischemic attack) nov. 2011  . GERD (gastroesophageal reflux disease)   . Allergic rhinitis   . Dyspnea     PFT 12/19/10: FEV1 2.93(107%), FEV1% 72, TLC 8.02(138%), DLCO 110%, +BD  . Sciatic nerve pain   . GERD (gastroesophageal reflux disease)   . Hypertension   .  Hyperlipidemia   . BPH (benign prostatic hyperplasia)   . ED (erectile dysfunction)   . Asthma   . AICD (automatic cardioverter/defibrillator) present   . Arthritis   . Myocardial infarction [facility])    Past Surgical History  Procedure Laterality Date  . Implantation of medtronic dual-chamber cardioverter    . Tonsillectomy  1948  . Left knee replacement  2006  . Cataract extraction, bilateral    . Prostate biopsy    . Coronary angioplasty    . Eye surgery Bilateral     cataract extraction  . Joint replacement Left     total knee replacement  . Total knee revision Left 06/20/2015    Procedure: TOTAL KNEE REVISION;  Surgeon: [name], MD;  Location: [facility] ORS;  Service: Orthopedics;  Laterality: Left;    Current Outpatient Prescriptions  Medication Sig Dispense Refill  . amoxicillin (AMOXIL) 500 MG capsule Take 500 mg by mouth 2 (two) times daily. DENTIST APPT.    [name] atorvastatin (LIPITOR) 80 MG tablet Take 1 tablet (80 mg total) by mouth daily. 90 tablet 3  . bisoprolol (ZEBETA) 5 MG tablet Take 1 tablet (5 mg total) by mouth daily. 30 tablet 6  . calcium-vitamin D (OSCAL 500/200 D-3) 500-200 MG-UNIT per tablet Take 1 tablet by mouth daily.      [name] dutasteride (AVODART) 0.5 MG capsule Take 0.5 mg by mouth 3 (three) times a week.      [name] ELIQUIS 5 MG TABS tablet TAKE ONE TABLET BY MOUTH TWICE DAILY 180 tablet 3  . latanoprost (  XALATAN) 0.005 % ophthalmic solution Place 1 drop into both eyes at bedtime.     [name] losartan (COZAAR) 50 MG tablet Take 50 mg by mouth daily.    . meloxicam (MOBIC) 15 MG tablet Take 15 mg by mouth daily.    . Multiple Vitamin (MULTIVITAMIN) tablet Take 1 tablet by mouth daily.      . ranitidine (ZANTAC) 75 MG tablet Take 150 mg by mouth at bedtime.     . traZODone (DESYREL) 100 MG tablet Take 100 mg by mouth at bedtime.     [name] zolpidem (AMBIEN) 5 MG tablet Take 10 mg by mouth at bedtime as needed for sleep.      No current facility-administered medications for  this visit.    Allergies:   Beta adrenergic blockers; Oxycodone; and Sulfonamide derivatives   Social History: Social History   Social History  . Marital Status: Married    Spouse Name: N/A  . Number of Children: 4  . Years of Education: N/A   Occupational History  . Retired Chief Financial Officer    Social History Main Topics  . Smoking status: Never Smoker   . Smokeless tobacco: Never Used  . Alcohol Use: Yes     Comment: 2 glasses of alcohol per day  . Drug Use: No  . Sexual Activity: Not on file   Other Topics Concern  . Not on file   Social History Narrative   Regular exercise    Family History: Family History  Problem Relation Age of Onset  . Emphysema Father   . Lung cancer Father   . Cirrhosis Mother   . Heart attack Neg Hx   . Stroke Neg Hx   . Hypertension Brother   . Hypertension Son     Review of Systems: All other systems reviewed and are otherwise negative except as noted above.   Physical Exam: VS:  BP 130/70 mmHg  Pulse 70  Ht 5\' 6"  (1.676 m)  Wt 194 lb 9.6 oz (88.27 kg)  BMI 31.42 kg/m2 , BMI Body mass index is 31.42 kg/(m^2).  GEN- The patient is well appearing, alert and oriented x 3 today.   HEENT: normocephalic, atraumatic; sclera clear, conjunctiva pink; hearing intact; oropharynx clear; neck supple Lungs- Clear to ausculation bilaterally, normal work of breathing.  No wheezes, rales, rhonchi Heart- Regular rate and rhythm (paced) GI- soft, non-tender, non-distended, bowel sounds present, no hepatosplenomegaly Extremities- no clubbing, cyanosis, or edema; DP/PT/radial pulses 2+ bilaterally MS- no significant deformity or atrophy Skin- warm and dry, no rash or lesion; ICD pocket well healed Psych- euthymic mood, full affect Neuro- strength and sensation are intact  ICD interrogation- reviewed in detail today,  See PACEART report  EKG:  EKG is ordered today. The ekg ordered today shows AV pacing   Recent Labs: 06/23/2015: BUN 19; Creatinine,  Ser 0.88; Hemoglobin 11.4*; Platelets 108*; Potassium 4.2; Sodium 137   Wt Readings from Last 3 Encounters:  03/05/16 194 lb 9.6 oz (88.27 kg)  11/05/15 195 lb 8 oz (88.678 kg)  06/20/15 190 lb (86.183 kg)     Other studies Reviewed: Additional studies/ records that were reviewed today include: Dr Olin Pia notes, last echo  Assessment and Plan:  1.  Ischemic cardiomyopathy/Chronic systolic dysfunction Normalization of EF by echo 11/2014 euvolemic today Stable on an appropriate medical regimen ICD at [facility] - pt is device dependent today  Risks, benefits to CRTD gen change discussed with the patient today who wishes to proceed. Will schedule with Dr  Klein at the next available time.   2.  Paroxysmal atrial fibrillation Continue Eliquis for CHADS2VASC of at least 4 AF burden by device interrogation today 0%  3.  HTN Stable No change required today  4.  CAD No recent ischemic symptoms Continue medical therapy  Current medicines are reviewed at length with the patient today.   The patient does not have concerns regarding his medicines.  The following changes were made today:  none  Labs/ tests ordered today include: pre-procedure labs    Disposition:   Follow up with Dr [name] following generator change.    Signed, Chanetta Marshall, NP 03/05/2016 10:33 AM  [facility] [address] [place] [address] [phone] (office) [phone] (fax)

## 2016-03-18 NOTE — Interval H&P Note (Signed)
ICD Criteria  Current LVEF:50%. Within 12 months prior to implant: No   Heart failure history: Yes, Class II  Cardiomyopathy history: Yes, Ischemic Cardiomyopathy.  Atrial Fibrillation/Atrial Flutter: Yes, Persistent (> 7 Days).  Ventricular tachycardia history: Yes, Hemodynamic instability present. VT Type is unknown.  Cardiac arrest history: No.  History of syndromes with risk of sudden death: No.  Previous ICD: Yes, Reason for ICD:  Secondary prevention.  Current ICD indication: Secondary  PPM indication: No.   Class I or II Bradycardia indication present: No  Beta Blocker therapy for 3 or more months: Yes, prescribed.   Ace Inhibitor/ARB therapy for 3 or more months: Yes, prescribed.   History and Physical Interval Note:  03/18/2016 7:16 AM  [name].  has presented today for surgery, with the diagnosis of eri/cm  The various methods of treatment have been discussed with the patient and family. After consideration of risks, benefits and other options for treatment, the patient has consented to  Procedure(s):  BiV ICD Generator Changeout (N/A) as a surgical intervention .  The patient's history has been reviewed, patient examined, no change in status, stable for surgery.  I have reviewed the patient's chart and labs.  Questions were answered to the patient's satisfaction.     [name]

## 2016-03-20 ENCOUNTER — Other Ambulatory Visit: Payer: Self-pay | Admitting: Physical Medicine and Rehabilitation

## 2016-03-20 DIAGNOSIS — M5417 Radiculopathy, lumbosacral region: Secondary | ICD-10-CM

## 2016-03-24 ENCOUNTER — Ambulatory Visit
Admission: RE | Admit: 2016-03-24 | Discharge: 2016-03-24 | Disposition: A | Discharge status: Home / Self Care | Payer: Medicare Other | Source: Ambulatory Visit | Attending: Physical Medicine and Rehabilitation | Admitting: Physical Medicine and Rehabilitation

## 2016-03-24 DIAGNOSIS — M5417 Radiculopathy, lumbosacral region: Secondary | ICD-10-CM

## 2016-03-24 DIAGNOSIS — M4806 Spinal stenosis, lumbar region: Secondary | ICD-10-CM | POA: Diagnosis not present

## 2016-03-24 DIAGNOSIS — M4186 Other forms of scoliosis, lumbar region: Secondary | ICD-10-CM | POA: Diagnosis not present

## 2016-03-24 DIAGNOSIS — M5416 Radiculopathy, lumbar region: Secondary | ICD-10-CM | POA: Insufficient documentation

## 2016-03-30 ENCOUNTER — Ambulatory Visit (INDEPENDENT_AMBULATORY_CARE_PROVIDER_SITE_OTHER): Payer: Medicare Other | Admitting: *Deleted

## 2016-03-30 ENCOUNTER — Encounter: Payer: Self-pay | Admitting: Internal Medicine

## 2016-03-30 DIAGNOSIS — I5022 Chronic systolic (congestive) heart failure: Secondary | ICD-10-CM | POA: Diagnosis not present

## 2016-03-30 DIAGNOSIS — I255 Ischemic cardiomyopathy: Secondary | ICD-10-CM | POA: Diagnosis not present

## 2016-03-30 LAB — CUP PACEART INCLINIC DEVICE CHECK
Battery Remaining Longevity: 79 mo
Battery Voltage: 3.11 V
Brady Statistic AP VP Percent: 99.89 %
Brady Statistic RV Percent Paced: 99.92 %
Date Time Interrogation Session: 20170410151123
HIGH POWER IMPEDANCE MEASURED VALUE: 41 Ohm
HighPow Impedance: 49 Ohm
Implantable Lead Implant Date: 20070615
Implantable Lead Implant Date: 20120813
Implantable Lead Location: 753858
Implantable Lead Model: 4396
Implantable Lead Model: 7121
Lead Channel Impedance Value: 342 Ohm
Lead Channel Impedance Value: 532 Ohm
Lead Channel Impedance Value: 817 Ohm
Lead Channel Pacing Threshold Amplitude: 0.75 V
Lead Channel Pacing Threshold Amplitude: 0.75 V
Lead Channel Pacing Threshold Amplitude: 1 V
Lead Channel Pacing Threshold Pulse Width: 0.4 ms
Lead Channel Pacing Threshold Pulse Width: 0.4 ms
Lead Channel Sensing Intrinsic Amplitude: 1 mV
Lead Channel Setting Pacing Amplitude: 2 V
Lead Channel Setting Pacing Pulse Width: 0.8 ms
Lead Channel Setting Sensing Sensitivity: 0.3 mV
MDC IDC LEAD IMPLANT DT: 20120813
MDC IDC LEAD LOCATION: 753859
MDC IDC LEAD LOCATION: 753860
MDC IDC MSMT LEADCHNL LV IMPEDANCE VALUE: 399 Ohm
MDC IDC MSMT LEADCHNL LV PACING THRESHOLD PULSEWIDTH: 0.8 ms
MDC IDC MSMT LEADCHNL RA SENSING INTR AMPL: 1 mV
MDC IDC MSMT LEADCHNL RV IMPEDANCE VALUE: 228 Ohm
MDC IDC MSMT LEADCHNL RV IMPEDANCE VALUE: 361 Ohm
MDC IDC SET LEADCHNL LV PACING AMPLITUDE: 1.75 V
MDC IDC SET LEADCHNL RV PACING AMPLITUDE: 2.5 V
MDC IDC SET LEADCHNL RV PACING PULSEWIDTH: 0.4 ms
MDC IDC STAT BRADY AP VS PERCENT: 0.01 %
MDC IDC STAT BRADY AS VP PERCENT: 0.09 %
MDC IDC STAT BRADY AS VS PERCENT: 0.01 %
MDC IDC STAT BRADY RA PERCENT PACED: 99.9 %

## 2016-03-30 NOTE — Progress Notes (Signed)
Wound check appointment. Dermabond removed. Wound without redness or edema. Incision edges approximated, wound well healed. Normal device function. Thresholds, sensing, and impedances consistent with implant measurements. Device programmed at appropriate safety margins. Patient c/o HR staying in 80s when exercising and decreased stamina--ADL response/Exertion response increased to 4/5, which is how it was programmed prior to gen change. Patient walked 2 laps around office after adjustment and stated that he "felt fine"; HR was 112bpm. No mode switches or ventricular arrhythmias noted. Patient educated about wound care, arm mobility, and shock plan. ROV in 3 months with SK/B.

## 2016-05-29 LAB — CUP PACEART INCLINIC DEVICE CHECK: MDC IDC SESS DTM: 20170609112121

## 2016-06-16 ENCOUNTER — Telehealth: Payer: Self-pay | Admitting: Internal Medicine

## 2016-06-16 NOTE — Telephone Encounter (Signed)
Call received from the patient's PCP today. He states he saw the patient for follow up today. He reports that the patient is currently taking eliquis for management of his a-fib. However, he has also been taking meloxicam, per another provider, for management of his arthritic pain. Per Dr. Baldemar Lenis- he had a discussion with the patient that he cannot be on both eliquis and meloxicam. The patient requested Dr. Baldemar Lenis call our office to confirm with Dr. [name] that he should remain on Eliquis and not just an aspirin. I advised Dr. Baldemar Lenis that this would most likely be the recommendation, but will confirm with Dr. [name] and touch base with the patient.

## 2016-06-19 NOTE — Telephone Encounter (Signed)
I spoke with the patient and advised him of Dr. Olin Pia recommendations.

## 2016-06-19 NOTE — Telephone Encounter (Signed)
Agree wi youi 100%  On apixoban

## 2016-06-30 ENCOUNTER — Encounter: Payer: Medicare Other | Admitting: Internal Medicine

## 2016-06-30 ENCOUNTER — Ambulatory Visit (INDEPENDENT_AMBULATORY_CARE_PROVIDER_SITE_OTHER): Payer: Medicare Other | Admitting: Internal Medicine

## 2016-06-30 ENCOUNTER — Encounter: Payer: Self-pay | Admitting: Internal Medicine

## 2016-06-30 VITALS — BP 140/86 | HR 64 | Ht 66.0 in | Wt 194.0 lb

## 2016-06-30 DIAGNOSIS — I442 Atrioventricular block, complete: Secondary | ICD-10-CM

## 2016-06-30 DIAGNOSIS — I480 Paroxysmal atrial fibrillation: Secondary | ICD-10-CM

## 2016-06-30 DIAGNOSIS — I5022 Chronic systolic (congestive) heart failure: Secondary | ICD-10-CM | POA: Diagnosis not present

## 2016-06-30 DIAGNOSIS — Z95810 Presence of automatic (implantable) cardiac defibrillator: Secondary | ICD-10-CM

## 2016-06-30 DIAGNOSIS — I255 Ischemic cardiomyopathy: Secondary | ICD-10-CM | POA: Diagnosis not present

## 2016-06-30 NOTE — Patient Instructions (Signed)
Medication Instructions: - Your physician recommends that you continue on your current medications as directed. Please refer to the Current Medication list given to you today.  Labwork: - none  Procedures/Testing: - none  Follow-Up: - Remote monitoring is used to monitor your Pacemaker of ICD from home. This monitoring reduces the number of office visits required to check your device to one time per year. It allows [place] to keep an eye on the functioning of your device to ensure it is working properly. You are scheduled for a device check from home on 09/29/16. You may send your transmission at any time that day. If you have a wireless device, the transmission will be sent automatically. After your physician reviews your transmission, you will receive a postcard with your next transmission date.  - Your physician wants you to follow-up in: 9 months with Dr. [name]. You will receive a reminder letter in the mail two months in advance. If you don't receive a letter, please call our office to schedule the follow-up appointment.  Any Additional Special Instructions Will Be Listed Below (If Applicable).     If you need a refill on your cardiac medications before your next appointment, please call your pharmacy.

## 2016-06-30 NOTE — Progress Notes (Signed)
Patient Care Team: Derinda Late, MD as PCP - General (Family Medicine) Rocco Serene, MD (Unknown Physician Specialty)   HPI  Ronnie [place] Nelly Devers. is a 75 y.o. male Seen in followup for ICD implantation initially for syncope in the setting of depressed left ventricular function. He had a 6949-lead. He reached ERI summer 2012 and underwent device explantation with new defibrillator lead insertion with left ventricular lead placement and generator replacement.   He underwent device generator replacement again 3/17   He has a history of ischemic cardiomyopathy with prior stenting of an LAD. Catheterization 2012 demonstrated a patent stent and nonobstructive coronary disease. At that the time ejection fraction was 40%; He underwent stenting in [place] around 2007; Plavix was stopped 2014  Repeat Echo 12/15 Normal LV function   Labs reviewed 6/16 normal BMET and LDL 104   He was noted  to have atrial fibrillation.  His thromboembolic risk profile is notable for TIA, hypertension, age , vascular disease and heart failure. His CHADS-2 score his 4 and his CHADS-VASc score is 6  Is on NOAC \  His PCP has noted a mild drop in hemoglobin (see below) Hemoccult were negative.  Activity is much reduced. This is largely consequent pain.        Past Medical History  Diagnosis Date  . ischemic cardiomyopathy     stent to a 95%-occluded LAD in 1995; catheterization in 2012 demonstrated patent grafts and nonobstructive disease  . Syncope        . Cardiac arrest ([place])   . Biventricular defibrillator-Medtronic     Generator replacement 2012 with CRT upgrade  . Severe sinus bradycardia   . High-grade heart block   . TIA (transient ischemic attack) nov. 2011  . GERD (gastroesophageal reflux disease)   . Allergic rhinitis   . Dyspnea     PFT 12/19/10: FEV1 2.93(107%), FEV1% 72, TLC 8.02(138%), DLCO 110%, +BD  . Sciatic nerve pain   . GERD (gastroesophageal reflux disease)   . Hypertension    . Hyperlipidemia   . BPH (benign prostatic hyperplasia)   . ED (erectile dysfunction)   . Asthma   . AICD (automatic cardioverter/defibrillator) present   . Arthritis   . Myocardial infarction ([place])   . History of kidney stones     Past Surgical History  Procedure Laterality Date  . Implantation of medtronic dual-chamber cardioverter    . Tonsillectomy  1948  . Left knee replacement  2006  . Cataract extraction, bilateral    . Prostate biopsy    . Coronary angioplasty    . Eye surgery Bilateral     cataract extraction  . Joint replacement Left     total knee replacement  . Total knee revision Left 06/20/2015    Procedure: TOTAL KNEE REVISION;  Surgeon: [name], MD;  Location: [facility] ORS;  Service: Orthopedics;  Laterality: Left;  . Ep implantable device N/A 03/18/2016    Procedure:  BiV ICD Generator Changeout;  Surgeon: [name], MD;  Location: [place] [facility];  Service: Cardiovascular;  Laterality: N/A;    Current Outpatient Prescriptions  Medication Sig Dispense Refill  . amoxicillin (AMOXIL) 500 MG capsule Take 500 mg by mouth 2 (two) times daily. DENTIST APPT.    [name] atorvastatin (LIPITOR) 80 MG tablet Take 1 tablet (80 mg total) by mouth daily. 90 tablet 3  . bisoprolol (ZEBETA) 5 MG tablet Take 1 tablet (5 mg total) by mouth daily. 30 tablet 6  . Calcium  Citrate-Vitamin D (CITRACAL + D PO) Take by mouth daily.    [name] dutasteride (AVODART) 0.5 MG capsule Take 0.5 mg by mouth 3 (three) times a week.      [name] ELIQUIS 5 MG TABS tablet TAKE ONE TABLET BY MOUTH TWICE DAILY 180 tablet 3  . latanoprost (XALATAN) 0.005 % ophthalmic solution Place 1 drop into the right eye at bedtime.     [name] losartan (COZAAR) 50 MG tablet Take 50 mg by mouth daily.    . Multiple Vitamin (MULTIVITAMIN) tablet Take 1 tablet by mouth daily.      . ranitidine (ZANTAC) 75 MG tablet Take 75 mg by mouth at bedtime.     . traZODone (DESYREL) 100 MG tablet Take 100 mg by mouth at bedtime.     [name]  zolpidem (AMBIEN) 5 MG tablet Take 10 mg by mouth at bedtime as needed for sleep.      No current facility-administered medications for this visit.    Allergies  Allergen Reactions  . Beta Adrenergic Blockers Other (See Comments)    Fatigue  . Oxycodone Nausea And Vomiting  . Sulfonamide Derivatives Nausea And Vomiting    Review of Systems negative except from HPI and PMH  Physical Exam BP 140/86 mmHg  Pulse 64  Ht 5\' 6"  (1.676 m)  Wt 194 lb (87.998 kg)  BMI 31.33 kg/m2 Well developed and well nourished in no acute distress HENT normal E scleral and icterus clear Neck Supple JVP flat; carotids brisk and full Clear to ausculation Device pocket well healed; without hematoma or erythema.  There is no tethering r.egular rate and rhythm, no murmurs gallops or rub Soft with active bowel sounds No clubbing cyanosis no  Edema Alert and oriented, grossly normal motor and sensory function Skin Warm and Dry  ECG demonstrates AV biventricular pacing   Assessment and  Plan \ Ischemic Cardiomyopathy interval normalization  Complete heart block  Progressive now device dependent  Implantable defibrillator Medtronic The patient's device was interrogated.  The information was reviewed. No changes were made in the programming.    Atrial fibrillation No intercurrent atrial fibrillation or flutter  Hypertension  HFrEF  Euvolemic    With normalization of LV function, we will continue beta blockers and ARB's but will not use Entresto or Aldactone  Without symptoms of ischemia  Recurrent afib  Continue NOAC   There has been an interval change in his hemoglobin last 18 months, 14.3--12.4. Hemoccult was apparently negative. This is to be further evaluated.  In terms of his NSAIDs for his back pain, my comment was that it is a balancing between the bleeding risks of NSAIDs plus ELIQUIS versus the misery and being relatively immobile. I suggested that he take as little NSAIDs t as  possible but hopefully we can avoid chronic misery.

## 2016-07-06 ENCOUNTER — Telehealth: Payer: Self-pay | Admitting: *Deleted

## 2016-07-06 LAB — CUP PACEART INCLINIC DEVICE CHECK
Battery Remaining Longevity: 78 mo
Battery Voltage: 3.04 V
Brady Statistic RA Percent Paced: 99.89 %
Date Time Interrogation Session: 20170711133734
HIGH POWER IMPEDANCE MEASURED VALUE: 44 Ohm
HIGH POWER IMPEDANCE MEASURED VALUE: 56 Ohm
Implantable Lead Implant Date: 20070615
Implantable Lead Implant Date: 20120813
Implantable Lead Location: 753860
Implantable Lead Model: 4396
Implantable Lead Model: 5076
Implantable Lead Model: 7121
Lead Channel Impedance Value: 361 Ohm
Lead Channel Setting Pacing Amplitude: 1.75 V
Lead Channel Setting Pacing Amplitude: 2 V
Lead Channel Setting Pacing Pulse Width: 0.8 ms
MDC IDC LEAD IMPLANT DT: 20120813
MDC IDC LEAD LOCATION: 753858
MDC IDC LEAD LOCATION: 753859
MDC IDC MSMT LEADCHNL LV IMPEDANCE VALUE: 513 Ohm
MDC IDC MSMT LEADCHNL LV IMPEDANCE VALUE: 551 Ohm
MDC IDC MSMT LEADCHNL LV IMPEDANCE VALUE: 893 Ohm
MDC IDC MSMT LEADCHNL RA IMPEDANCE VALUE: 361 Ohm
MDC IDC MSMT LEADCHNL RV IMPEDANCE VALUE: 247 Ohm
MDC IDC SET LEADCHNL RV PACING AMPLITUDE: 2.5 V
MDC IDC SET LEADCHNL RV PACING PULSEWIDTH: 0.4 ms
MDC IDC SET LEADCHNL RV SENSING SENSITIVITY: 0.3 mV
MDC IDC STAT BRADY AP VP PERCENT: 99.89 %
MDC IDC STAT BRADY AP VS PERCENT: 0 %
MDC IDC STAT BRADY AS VP PERCENT: 0.11 %
MDC IDC STAT BRADY AS VS PERCENT: 0 %
MDC IDC STAT BRADY RV PERCENT PACED: 99.74 %

## 2016-07-06 NOTE — Telephone Encounter (Addendum)
Called patient regarding [employer] transmitter--patient is still using 2490 and [place] monitor.  Patient did not receive [phone] monitor when he left hospital.  Called [employer] to order new Carelink monitor, should arrive within 7-10 days.  Patient made aware and is appreciative.  Patient aware to call for assistance with setting up new monitor when he receives it if he has any issues.  Patient denies questions or concerns at this time.

## 2016-07-09 ENCOUNTER — Encounter: Payer: Self-pay | Admitting: Internal Medicine

## 2016-09-01 DIAGNOSIS — N200 Calculus of kidney: Secondary | ICD-10-CM | POA: Insufficient documentation

## 2016-09-23 ENCOUNTER — Other Ambulatory Visit: Payer: Self-pay | Admitting: Neurosurgery

## 2016-09-23 DIAGNOSIS — M545 Low back pain: Principal | ICD-10-CM

## 2016-09-23 DIAGNOSIS — G8929 Other chronic pain: Secondary | ICD-10-CM

## 2016-09-24 ENCOUNTER — Telehealth: Payer: Self-pay | Admitting: Pharmacist

## 2016-09-24 NOTE — Telephone Encounter (Signed)
Received request from [place] that pt needs a lumbar myelogram.  Pt will need to be off his Eliquis for 3 days prior. However, he takes his Eliquis for afib and has a CHADS2 score of 4 (HTN, HF, and TIA in 2011). Will defer final recommendation to Dr [name] to see if patient is ok to hold Eliquis for 3 days prior to procedure given history of TIA.

## 2016-09-28 NOTE — Telephone Encounter (Signed)
It may be that the best solution is for him to have an MRI if this could be arranged potentially at [facility] or ;  Some of these institutions are doing MRI with devices in place I will look into it

## 2016-09-29 ENCOUNTER — Ambulatory Visit (INDEPENDENT_AMBULATORY_CARE_PROVIDER_SITE_OTHER): Payer: Medicare Other | Admitting: *Deleted

## 2016-09-29 ENCOUNTER — Telehealth: Payer: Self-pay | Admitting: Cardiology

## 2016-09-29 DIAGNOSIS — I255 Ischemic cardiomyopathy: Secondary | ICD-10-CM | POA: Diagnosis not present

## 2016-09-29 DIAGNOSIS — I5022 Chronic systolic (congestive) heart failure: Secondary | ICD-10-CM

## 2016-09-29 NOTE — Telephone Encounter (Signed)
LMOVM reminding pt to send remote transmission.   

## 2016-09-30 NOTE — Telephone Encounter (Signed)
Per Dr. [name]- he is wondering if the goal was to try to do an MRI on the patient instead of a myelogram, but if this not being pursued due to his device. Per Dr. [name]- we need to clarify if an MRI was discussed with the patient.  I attempted to call the patient. I left a message with the patient's wife to have the patient call me back as she was unsure of who initially ordered the myelogram.

## 2016-10-01 NOTE — Telephone Encounter (Signed)
I spoke with the patient yesterday, he did confirm that MRI would be the preferred test, but with his device this was not an option. I advised him that per Dr. [name], that this may be an option at [facility]- we will investigate this and be back in touch with him or Dr. Newman Pies who has ordered the myelogram. The patient stated he will be gone all next week and return on the weekend.   Dr. [name] reached out to a physician at [facility] today to see if an MRI there would be an option for the patient.

## 2016-10-02 ENCOUNTER — Encounter: Payer: Self-pay | Admitting: Cardiology

## 2016-10-02 NOTE — Progress Notes (Signed)
Remote ICD transmission.   

## 2016-10-13 ENCOUNTER — Telehealth: Payer: Self-pay | Admitting: Internal Medicine

## 2016-10-13 NOTE — Telephone Encounter (Signed)
See phone note from 09/24/16.

## 2016-10-13 NOTE — Telephone Encounter (Signed)
Pt is calling because needs to get permisson to stop Eliquis for three days so he can get a test done called a Myogram.

## 2016-10-14 NOTE — Telephone Encounter (Signed)
Call back from [place]. I advised them that Dr. [name] has not yet given his ok for holding eliquis prior to myelogram due to stroke history and need for bridging.  I advised he is in the process of speaking with a physician at [facility] regarding the possibility of MRI being done there with his device, but that Dr. [name] is out this week and so I will not be able to get them more information until next week.

## 2016-10-14 NOTE — Telephone Encounter (Signed)
Will need to defer to Dr. [name] when he returns next week- the patient will most likely need to be bridged if he has a myelogram.  Dr. [name] was looking at other options for this- possibly an MRI at [facility]. I left a message for Angelita Ingles at [employer] to please call and confirm if the patient actually has a date for his myelogram.   Previous encounter also open for the same issue. See 09/24/16 phone note. Will close this encounter.

## 2016-10-14 NOTE — Telephone Encounter (Signed)
Emily Filbert, RN Registered Nurse Sign at exiting of workspace  Date of Service: 10/14/2016 10:49 AM  Edit Delete  Bookmark Copy     Will need to defer to Dr. [name] when he returns next week- the patient will most likely need to be bridged if he has a myelogram.  Dr. [name] was looking at other options for this- possibly an MRI at [facility]. I left a message for Angelita Ingles at [place] to please call and confirm if the patient actually has a date for his myelogram.                Erskine Emery, [facility] Pharmacist Signed  Date of Service: 10/13/2016 12:19 PM  Bookmark Copy     See phone note from 09/24/16.               Tonette Lederer  Signed  Date of Service: 10/13/2016 11:52 AM  Bookmark Copy     Pt is calling because needs to get permisson to stop Eliquis for three days so he can get a test done called a Myogram.

## 2016-10-16 ENCOUNTER — Telehealth: Payer: Self-pay | Admitting: Internal Medicine

## 2016-10-16 NOTE — Telephone Encounter (Signed)
New message  Pt needs a call back  Permission to stop eliquis prior to monogram

## 2016-10-16 NOTE — Telephone Encounter (Signed)
I called and spoke with the patient. I advised him I do not yet have clearance from Dr. [name] to d/c eliquis for mylegram. He is aware that Dr. [name] is out this week, and I will be able to touch base with him next week to follow up regarding proceeding with mylegram vs MRI at [facility]. He is agreeable.  The patient is also aware that I have been in touch with [facility] to update them on the reason for the delay. Will close this encounter as a previous one is open for the same issue.

## 2016-10-18 NOTE — Telephone Encounter (Signed)
I dropped the ball   Have ok from [facility] to get their help to do MRI   The pt wojld need to be seen in the clinic first.  We need to send contact info to Dr Willis Modena

## 2016-10-20 NOTE — Telephone Encounter (Signed)
Dr. [name] called and spoke with the patient and advised him that Dr. Willis Modena will need to see him for consult at [facility] prior to MRI being done. The patient was agreeable with this- Dr. [name] forwarded the patient's contact info to Dr. Willis Modena. The patient was advised he should hear from Dr. Candiss Norse office. The patient should call back if he does not hear from them within the next few days.

## 2016-10-26 ENCOUNTER — Telehealth: Payer: Self-pay | Admitting: Internal Medicine

## 2016-10-26 NOTE — Telephone Encounter (Signed)
[facility] about a clearance that was sent on 09/24/16 to see if patient has been cleared for the Myogram .

## 2016-10-26 NOTE — Telephone Encounter (Signed)
Called, spoke with [place] at [facility]. Roberta requesting clearance for mylegram. Informed Dr. [name] and nurse are out of the office today and tomorrow. Will forward to Dr. [name] and nurse.

## 2016-10-29 ENCOUNTER — Telehealth: Payer: Self-pay

## 2016-10-29 ENCOUNTER — Ambulatory Visit (INDEPENDENT_AMBULATORY_CARE_PROVIDER_SITE_OTHER): Payer: Medicare Other | Admitting: *Deleted

## 2016-10-29 DIAGNOSIS — Z95810 Presence of automatic (implantable) cardiac defibrillator: Secondary | ICD-10-CM

## 2016-10-29 LAB — CUP PACEART REMOTE DEVICE CHECK
Battery Remaining Longevity: 74 mo
Brady Statistic AP VP Percent: 99.93 %
Brady Statistic AP VS Percent: 0.01 %
Brady Statistic RA Percent Paced: 99.94 %
Brady Statistic RV Percent Paced: 99.21 %
HighPow Impedance: 41 Ohm
HighPow Impedance: 51 Ohm
Implantable Lead Implant Date: 20070615
Implantable Lead Implant Date: 20120813
Implantable Lead Location: 753858
Implantable Lead Location: 753859
Implantable Lead Model: 5076
Implantable Lead Model: 7121
Implantable Pulse Generator Implant Date: 20170329
Lead Channel Impedance Value: 342 Ohm
Lead Channel Impedance Value: 931 Ohm
Lead Channel Pacing Threshold Amplitude: 1 V
Lead Channel Pacing Threshold Pulse Width: 0.4 ms
Lead Channel Sensing Intrinsic Amplitude: 1.125 mV
Lead Channel Setting Pacing Amplitude: 1.75 V
Lead Channel Setting Pacing Pulse Width: 0.8 ms
MDC IDC LEAD IMPLANT DT: 20120813
MDC IDC LEAD LOCATION: 753860
MDC IDC LEAD MODEL: 4396
MDC IDC MSMT BATTERY VOLTAGE: 2.98 V
MDC IDC MSMT LEADCHNL LV IMPEDANCE VALUE: 475 Ohm
MDC IDC MSMT LEADCHNL LV IMPEDANCE VALUE: 532 Ohm
MDC IDC MSMT LEADCHNL RA SENSING INTR AMPL: 1.125 mV
MDC IDC MSMT LEADCHNL RV IMPEDANCE VALUE: 228 Ohm
MDC IDC MSMT LEADCHNL RV IMPEDANCE VALUE: 342 Ohm
MDC IDC MSMT LEADCHNL RV SENSING INTR AMPL: 7.5 mV
MDC IDC MSMT LEADCHNL RV SENSING INTR AMPL: 7.5 mV
MDC IDC SESS DTM: 20171013175417
MDC IDC SET LEADCHNL RA PACING AMPLITUDE: 2 V
MDC IDC SET LEADCHNL RV PACING AMPLITUDE: 2.5 V
MDC IDC SET LEADCHNL RV PACING PULSEWIDTH: 0.4 ms
MDC IDC SET LEADCHNL RV SENSING SENSITIVITY: 0.3 mV
MDC IDC STAT BRADY AS VP PERCENT: 0.06 %
MDC IDC STAT BRADY AS VS PERCENT: 0 %

## 2016-10-29 NOTE — Telephone Encounter (Signed)
Per our protocol, patient would need to hold Eliquis for 3 days prior to lumbar myelogram. However, he takes his Eliquis for afib and has a CHADS2 score of 4 (HTN, HF, and TIA in 2011). This makes anticoagulation difficult since pt is at elevated risk for both stroke and bleeding. There is no role for bridging with Lovenox since PK/PD are similar between Lovenox and Eliquis. Would see if Dr [name] and [facility] are comfortable with patient holding his Eliquis for 36-48 hours to try to minimize risk as best we can.

## 2016-10-29 NOTE — Progress Notes (Signed)
Add on device lead warning. RV tip-RV coil lead impedance 190ohms. RA,RV,LV thresholds consistent with previous OV. RV defib impedance stable. Information reviewed with industry. Avg RV tip-RV coil 250ohms previous 6 months. Pt instructed to call device clinic if device alarms again.

## 2016-10-29 NOTE — Telephone Encounter (Signed)
Pt called stated that his device alarmed, asked pt to send in a manual transmission, pt attempted to send in a manual but got an error code, number to [place] services, pt to call and troubleshoot monitor and then call device clinic.

## 2016-10-29 NOTE — Telephone Encounter (Signed)
Called pt and let him know that he would need to come into the device clinic to have device checked, pt agreeable to come to device clinic at [address] office at 11:00am today.

## 2016-10-29 NOTE — Telephone Encounter (Signed)
Dr. [name] had arranged for the patient to see Dr. Willis Modena (cardiology) at [facility] to evaluate for possibility of having an MRI at [facility] in place of the lumbar myelogram at [place].  Message received from Dr. [name] this morning that Dr. Willis Modena had contacted him. "The patient was seen and evaluated at [facility] yesterday. Unfortunately our protocol currently does not allow for doing pacer dependent patients who have non-conditional devices, only those non pacer-dependent."  Per Dr. [name]- the patient will need to proceed with his lumbar myelogram and wanted to clarify with Jinny Blossom- Pharm D how best to get him ready for this in regards to his eliquis and history of TIA.   Will forward to [facility] to review- she had initially reviewed on 09/24/16.

## 2016-10-29 NOTE — Telephone Encounter (Deleted)
Pt called stated that his device was alarming, requested pt to send in a manual transmission, pt attempted to send in a transmission but got an error, gave pt number to [place], pt to call and troubleshoot monitor then call office.

## 2016-10-30 ENCOUNTER — Encounter: Payer: Self-pay | Admitting: Internal Medicine

## 2016-10-30 ENCOUNTER — Encounter: Payer: Self-pay | Admitting: *Deleted

## 2016-10-30 NOTE — Telephone Encounter (Signed)
I spoke with Dr. [name]- orders received that the patient may hold his eliquis for 48 hours prior to his myelogram, but he should resume as soon as this is safely possible due to his history of TIA/ CHADS2 of 4. Letter faxed to [place] at [phone]. Confirmation received.   MyChart message sent to the patient to notify him of this.

## 2016-11-02 ENCOUNTER — Ambulatory Visit: Payer: Medicare Other | Admitting: *Deleted

## 2016-11-02 DIAGNOSIS — Z95810 Presence of automatic (implantable) cardiac defibrillator: Secondary | ICD-10-CM

## 2016-11-11 ENCOUNTER — Ambulatory Visit
Admission: RE | Admit: 2016-11-11 | Discharge: 2016-11-11 | Disposition: A | Discharge status: Home / Self Care | Payer: Medicare Other | Source: Ambulatory Visit | Attending: Neurosurgery | Admitting: Neurosurgery

## 2016-11-11 DIAGNOSIS — M545 Low back pain: Principal | ICD-10-CM

## 2016-11-11 DIAGNOSIS — G8929 Other chronic pain: Secondary | ICD-10-CM

## 2016-11-11 MED ORDER — DIAZEPAM 5 MG PO TABS
5.0000 mg | ORAL_TABLET | Freq: Once | ORAL | Status: AC
  Administered 2016-11-11: 5 mg via ORAL

## 2016-11-11 MED ORDER — METHYLPREDNISOLONE ACETATE 40 MG/ML INJ SUSP (RADIOLOG
120.0000 mg | Freq: Once | INTRAMUSCULAR | Status: AC
  Administered 2016-11-11: 120 mg via EPIDURAL

## 2016-11-11 MED ORDER — IOPAMIDOL (ISOVUE-M 200) INJECTION 41%
1.0000 mL | Freq: Once | INTRAMUSCULAR | Status: AC
  Administered 2016-11-11: 1 mL via EPIDURAL

## 2016-11-11 NOTE — Discharge Instructions (Signed)
Myelogram Discharge Instructions  1. Go home and rest quietly for the next 24 hours.  It is important to lie flat for the next 24 hours.  Get up only to go to the restroom.  You may lie in the bed or on a couch on your back, your stomach, your left side or your right side.  You may have one pillow under your head.  You may have pillows between your knees while you are on your side or under your knees while you are on your back.  2. DO NOT drive today.  Recline the seat as far back as it will go, while still wearing your seat belt, on the way home.  3. You may get up to go to the bathroom as needed.  You may sit up for 10 minutes to eat.  You may resume your normal diet and medications unless otherwise indicated.  Drink lots of extra fluids today and tomorrow.  4. The incidence of headache, nausea, or vomiting is about 5% (one in 20 patients).  If you develop a headache, lie flat and drink plenty of fluids until the headache goes away.  Caffeinated beverages may be helpful.  If you develop severe nausea and vomiting or a headache that does not go away with flat bed rest, call [phone].  5. You may resume normal activities after your 24 hours of bed rest is over; however, do not exert yourself strongly or do any heavy lifting tomorrow. If when you get up you have a headache when standing, go back to bed and force fluids for another 24 hours.  6. Call your physician for a follow-up appointment.  The results of your myelogram will be sent directly to your physician by the following day.  7. If you have any questions or if complications develop after you arrive home, please call [phone].  Discharge instructions have been explained to the patient.  The patient, or the person responsible for the patient, fully understands these instructions.       MAY RESUME ELIQUIS TODAY.   May resume Trazodone on Nov. 23, 2017 after 10:30 am.

## 2016-11-11 NOTE — Progress Notes (Signed)
Pt states he has been off his Eliquis and Trazodone for the past 2 days.

## 2016-11-23 ENCOUNTER — Other Ambulatory Visit: Payer: Self-pay | Admitting: Internal Medicine

## 2016-11-23 MED ORDER — APIXABAN 5 MG PO TABS: 5.0000 mg | ORAL_TABLET | Freq: Two times a day (BID) | ORAL | 2 refills | Status: DC

## 2016-12-15 NOTE — Progress Notes (Signed)
Pt seen d/t RV lead impedance warning. Device checked by industry. RV threshold and impedance and defib impedance WNL, reviewed w/ JA RV lead impedance pt auditory alert turned off, clinic will still receive alert.

## 2016-12-25 ENCOUNTER — Emergency Department
Admission: EM | Admit: 2016-12-25 | Discharge: 2016-12-25 | Disposition: A | Discharge status: Home / Self Care | Payer: Medicare Other | Attending: Emergency Medicine | Admitting: Emergency Medicine

## 2016-12-25 ENCOUNTER — Emergency Department: Payer: Medicare Other

## 2016-12-25 ENCOUNTER — Encounter: Payer: Self-pay | Admitting: Emergency Medicine

## 2016-12-25 DIAGNOSIS — I110 Hypertensive heart disease with heart failure: Secondary | ICD-10-CM | POA: Diagnosis not present

## 2016-12-25 DIAGNOSIS — J069 Acute upper respiratory infection, unspecified: Secondary | ICD-10-CM | POA: Diagnosis not present

## 2016-12-25 DIAGNOSIS — J45909 Unspecified asthma, uncomplicated: Secondary | ICD-10-CM | POA: Insufficient documentation

## 2016-12-25 DIAGNOSIS — I5022 Chronic systolic (congestive) heart failure: Secondary | ICD-10-CM | POA: Diagnosis not present

## 2016-12-25 DIAGNOSIS — Z95810 Presence of automatic (implantable) cardiac defibrillator: Secondary | ICD-10-CM | POA: Insufficient documentation

## 2016-12-25 DIAGNOSIS — Z79899 Other long term (current) drug therapy: Secondary | ICD-10-CM | POA: Diagnosis not present

## 2016-12-25 DIAGNOSIS — I252 Old myocardial infarction: Secondary | ICD-10-CM | POA: Insufficient documentation

## 2016-12-25 DIAGNOSIS — R509 Fever, unspecified: Secondary | ICD-10-CM | POA: Diagnosis present

## 2016-12-25 LAB — COMPREHENSIVE METABOLIC PANEL
ALBUMIN: 3.8 g/dL (ref 3.5–5.0)
ALT: 33 U/L (ref 17–63)
AST: 37 U/L (ref 15–41)
Alkaline Phosphatase: 55 U/L (ref 38–126)
Anion gap: 8 (ref 5–15)
BUN: 15 mg/dL (ref 6–20)
CHLORIDE: 99 mmol/L — AB (ref 101–111)
CO2: 26 mmol/L (ref 22–32)
Calcium: 9.2 mg/dL (ref 8.9–10.3)
Creatinine, Ser: 0.92 mg/dL (ref 0.61–1.24)
GFR calc Af Amer: >60 mL/min (ref >60)
GFR calc non Af Amer: >60 mL/min (ref >60)
GLUCOSE: 142 mg/dL — AB (ref 65–99)
POTASSIUM: 4.1 mmol/L (ref 3.5–5.1)
SODIUM: 133 mmol/L — AB (ref 135–145)
Total Bilirubin: 1.1 mg/dL (ref 0.3–1.2)
Total Protein: 6.6 g/dL (ref 6.5–8.1)

## 2016-12-25 LAB — URINALYSIS, COMPLETE (UACMP) WITH MICROSCOPIC
Bacteria, UA: NONE SEEN
Bilirubin Urine: NEGATIVE
GLUCOSE, UA: NEGATIVE mg/dL
Hgb urine dipstick: NEGATIVE
KETONES UR: NEGATIVE mg/dL
Leukocytes, UA: NEGATIVE
Nitrite: NEGATIVE
PROTEIN: 100 mg/dL — AB
SQUAMOUS EPITHELIAL / LPF: NONE SEEN
Specific Gravity, Urine: 1.026 (ref 1.005–1.030)
pH: 5 (ref 5.0–8.0)

## 2016-12-25 LAB — CBC
HCT: 37.8 % — ABNORMAL LOW (ref 40.0–52.0)
Hemoglobin: 12.8 g/dL — ABNORMAL LOW (ref 13.0–18.0)
MCH: 30.7 pg (ref 26.0–34.0)
MCHC: 33.8 g/dL (ref 32.0–36.0)
MCV: 90.7 fL (ref 80.0–100.0)
PLATELETS: 113 10*3/uL — AB (ref 150–440)
RBC: 4.16 MIL/uL — ABNORMAL LOW (ref 4.40–5.90)
RDW: 13.1 % (ref 11.5–14.5)
WBC: 6.2 10*3/uL (ref 3.8–10.6)

## 2016-12-25 LAB — RAPID INFLUENZA A&B ANTIGENS
Influenza A (ARMC): NEGATIVE
Influenza B (ARMC): NEGATIVE

## 2016-12-25 MED ORDER — AZITHROMYCIN 250 MG PO TABS: ORAL_TABLET | ORAL | 0 refills | Status: DC

## 2016-12-25 MED ORDER — PREDNISONE 10 MG (21) PO TBPK: ORAL_TABLET | ORAL | 0 refills | Status: DC

## 2016-12-25 MED ORDER — ALBUTEROL SULFATE HFA 108 (90 BASE) MCG/ACT IN AERS: 2.0000 | INHALATION_SPRAY | Freq: Four times a day (QID) | RESPIRATORY_TRACT | 2 refills | Status: DC | PRN

## 2016-12-25 MED ORDER — IPRATROPIUM-ALBUTEROL 0.5-2.5 (3) MG/3ML IN SOLN
3.0000 mL | Freq: Once | RESPIRATORY_TRACT | Status: AC
  Administered 2016-12-25: 3 mL via RESPIRATORY_TRACT
  Filled 2016-12-25: qty 3

## 2016-12-25 MED ORDER — PREDNISONE 20 MG PO TABS
60.0000 mg | ORAL_TABLET | Freq: Once | ORAL | Status: AC
  Administered 2016-12-25: 60 mg via ORAL
  Filled 2016-12-25: qty 3

## 2016-12-25 NOTE — ED Notes (Signed)
Patient returned from XR. 

## 2016-12-25 NOTE — ED Triage Notes (Signed)
Pt in with co fever and weakness since Tuesday, states has forceful cough.

## 2016-12-25 NOTE — ED Notes (Signed)
Patient transported to X-ray 

## 2016-12-25 NOTE — ED Provider Notes (Signed)
[facility] Emergency Department Provider Note        Time seen: ----------------------------------------- 7:43 AM on 12/25/2016 -----------------------------------------    I have reviewed the triage vital signs and the nursing notes.   HISTORY  Chief Complaint Fever    HPI [name]. is a 76 y.o. male who presents ER for fever and weakness since Tuesday. Patient states he had a forceful cough as well. He has had some chills but no body aches, sore throat, vomiting or diarrhea. Patient denies a history of same.   Past Medical History:  Diagnosis Date  . AICD (automatic cardioverter/defibrillator) present   . Allergic rhinitis   . Arthritis   . Asthma   . Biventricular defibrillator-Medtronic    Generator replacement 2012 with CRT upgrade  . BPH (benign prostatic hyperplasia)   . Cardiac arrest ([place])   . Dyspnea    PFT 12/19/10: FEV1 2.93(107%), FEV1% 72, TLC 8.02(138%), DLCO 110%, +BD  . ED (erectile dysfunction)   . GERD (gastroesophageal reflux disease)   . GERD (gastroesophageal reflux disease)   . High-grade heart block   . History of kidney stones   . Hyperlipidemia   . Hypertension   . 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 Myocardial infarction   . Sciatic nerve pain   . Severe sinus bradycardia   . Syncope       . TIA (transient ischemic attack) nov. 2011    Patient Active Problem List   Diagnosis Date Noted  . 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 Hyperlipidemia 08/25/2012  . Atrial fibrillation ([place]) 08/25/2012  . Biventricular cardioverter-defibrillator-Medtronic   . Severe sinus bradycardia   . High-grade heart block   . SHORTNESS OF BREATH 01/16/2011  . SYSTOLIC HEART FAILURE, CHRONIC 10/14/2010  . HYPERTENSION, BENIGN 10/15/2009  . Cardiomyopathy, ischemic 10/15/2009    Past Surgical  History:  Procedure Laterality Date  . CATARACT EXTRACTION, BILATERAL    . CORONARY ANGIOPLASTY    . EP IMPLANTABLE DEVICE N/A 03/18/2016   Procedure:  BiV ICD Generator Changeout;  Surgeon: [name], MD;  Location: [place] [facility];  Service: Cardiovascular;  Laterality: N/A;  . EYE SURGERY Bilateral    cataract extraction  . Implantation of Medtronic dual-chamber cardioverter    . JOINT REPLACEMENT Left    total knee replacement  . Left Knee Replacement  2006  . PROSTATE BIOPSY    . TONSILLECTOMY  1948  . TOTAL KNEE REVISION Left 06/20/2015   Procedure: TOTAL KNEE REVISION;  Surgeon: [name], MD;  Location: [facility] ORS;  Service: Orthopedics;  Laterality: Left;    Allergies Beta adrenergic blockers; Oxycodone; and Sulfonamide derivatives  Social History Social History  Substance Use Topics  . Smoking status: Never Smoker  . Smokeless tobacco: Never Used  . Alcohol use Yes     Comment: 2 glasses of alcohol per day    Review of Systems Constitutional: Positive for fever Cardiovascular: Negative for chest pain. Respiratory: Negative for shortness of breath.Positive for cough Gastrointestinal: Negative for abdominal pain, vomiting and diarrhea. Genitourinary: Negative for dysuria. Musculoskeletal: Negative for back pain. Skin: Negative for rash. Neurological: Negative for headaches, positive for generalized weakness  10-point ROS otherwise negative.  ____________________________________________   PHYSICAL EXAM:  VITAL SIGNS: ED Triage Vitals  Enc Vitals Group     BP 12/25/16 0614 (!) 152/76  Pulse Rate 12/25/16 0613 (!) 59     Resp 12/25/16 0613 20     Temp 12/25/16 0613 (!) 100.6 F (38.1 C)     Temp src --      SpO2 12/25/16 0614 95 %     Weight 12/25/16 0613 185 lb (83.9 kg)     Height 12/25/16 0613 5\' 6"  (1.676 m)     Head Circumference --      Peak Flow --      Pain Score 12/25/16 0613 2     Pain Loc --      Pain Edu? --      Excl. in [place]?  --     Constitutional: Alert and oriented. Well appearing and in no distress. Eyes: Conjunctivae are normal. PERRL. Normal extraocular movements. ENT   Head: Normocephalic and atraumatic.   Nose: No congestion/rhinnorhea.   Mouth/Throat: Mucous membranes are moist.   Neck: No stridor. Cardiovascular: Normal rate, regular rhythm. No murmurs, rubs, or gallops. Respiratory: Diminished breath sounds with wheezing bilaterally Gastrointestinal: Soft and nontender. Normal bowel sounds Musculoskeletal: Nontender with normal range of motion in all extremities. No lower extremity tenderness nor edema. Neurologic:  Normal speech and language. No gross focal neurologic deficits are appreciated.  Skin:  Skin is warm, dry and intact. No rash noted. Psychiatric: Mood and affect are normal. Speech and behavior are normal.  ____________________________________________  ED COURSE:  Pertinent labs & imaging results that were available during my care of the patient were reviewed by me and considered in my medical decision making (see chart for details). Clinical Course   Patient is in no acute distress, likely influenza. We will assess with labs and imaging.  Procedures ____________________________________________   LABS (pertinent positives/negatives)  Labs Reviewed  CBC - Abnormal; Notable for the following:       Result Value   RBC 4.16 (*)    Hemoglobin 12.8 (*)    HCT 37.8 (*)    Platelets 113 (*)    All other components within normal limits  COMPREHENSIVE METABOLIC PANEL - Abnormal; Notable for the following:    Sodium 133 (*)    Chloride 99 (*)    Glucose, Bld 142 (*)    All other components within normal limits  URINALYSIS, COMPLETE (UACMP) WITH MICROSCOPIC - Abnormal; Notable for the following:    Color, Urine YELLOW (*)    APPearance CLEAR (*)    Protein, ur 100 (*)    All other components within normal limits  RAPID INFLUENZA A&B ANTIGENS ([facility] ONLY)  CULTURE,  BLOOD (ROUTINE X 2)  CULTURE, BLOOD (ROUTINE X 2)    RADIOLOGY Images were viewed by me  Chest x-ray IMPRESSION: Stable cardiomegaly. Minimal linear opacity in the left base may be atelectatic. No consolidation or effusion. Normal vasculature.  ____________________________________________  FINAL ASSESSMENT AND PLAN  Fever, cough  Plan: Patient with labs and imaging as dictated above. Patient likely with bronchitis given the clinical presentation. He'll be given albuterol, steroids and antibiotics. This may likely be viral in origin. He appears stable for outpatient follow-up.   Earleen Newport, MD   Note: This dictation was prepared with Dragon dictation. Any transcriptional errors that result from this process are unintentional    Earleen Newport, MD 12/25/16 [phone]

## 2016-12-30 LAB — CULTURE, BLOOD (ROUTINE X 2)
CULTURE: NO GROWTH
CULTURE: NO GROWTH

## 2017-01-04 ENCOUNTER — Ambulatory Visit (INDEPENDENT_AMBULATORY_CARE_PROVIDER_SITE_OTHER): Payer: Medicare Other | Admitting: *Deleted

## 2017-01-04 DIAGNOSIS — I255 Ischemic cardiomyopathy: Secondary | ICD-10-CM

## 2017-01-04 DIAGNOSIS — I5022 Chronic systolic (congestive) heart failure: Secondary | ICD-10-CM

## 2017-01-05 NOTE — Progress Notes (Signed)
Remote ICD transmission.   

## 2017-01-08 LAB — CUP PACEART REMOTE DEVICE CHECK
Brady Statistic AP VP Percent: 99.89 %
Brady Statistic AS VP Percent: 0.1 %
Brady Statistic AS VS Percent: 0 %
Date Time Interrogation Session: 20180115081603
HighPow Impedance: 43 Ohm
HighPow Impedance: 54 Ohm
Implantable Lead Implant Date: 20070615
Implantable Lead Implant Date: 20120813
Implantable Lead Implant Date: 20120813
Implantable Lead Location: 753858
Implantable Lead Location: 753859
Implantable Lead Location: 753860
Implantable Lead Model: 7121
Lead Channel Impedance Value: 228 Ohm
Lead Channel Impedance Value: 361 Ohm
Lead Channel Impedance Value: 361 Ohm
Lead Channel Impedance Value: 456 Ohm
Lead Channel Impedance Value: 532 Ohm
Lead Channel Sensing Intrinsic Amplitude: 0.5 mV
Lead Channel Sensing Intrinsic Amplitude: 7.5 mV
Lead Channel Setting Pacing Amplitude: 2 V
Lead Channel Setting Pacing Amplitude: 2.5 V
Lead Channel Setting Sensing Sensitivity: 0.3 mV
MDC IDC MSMT BATTERY REMAINING LONGEVITY: 71 mo
MDC IDC MSMT BATTERY VOLTAGE: 3 V
MDC IDC MSMT LEADCHNL LV IMPEDANCE VALUE: 874 Ohm
MDC IDC MSMT LEADCHNL RA SENSING INTR AMPL: 0.5 mV
MDC IDC MSMT LEADCHNL RV PACING THRESHOLD AMPLITUDE: 1 V
MDC IDC MSMT LEADCHNL RV PACING THRESHOLD PULSEWIDTH: 0.4 ms
MDC IDC MSMT LEADCHNL RV SENSING INTR AMPL: 5.75 mV
MDC IDC PG IMPLANT DT: 20170329
MDC IDC SET LEADCHNL LV PACING AMPLITUDE: 1.75 V
MDC IDC SET LEADCHNL LV PACING PULSEWIDTH: 0.8 ms
MDC IDC SET LEADCHNL RV PACING PULSEWIDTH: 0.4 ms
MDC IDC STAT BRADY AP VS PERCENT: 0.01 %
MDC IDC STAT BRADY RA PERCENT PACED: 98.65 %
MDC IDC STAT BRADY RV PERCENT PACED: 99.32 %

## 2017-03-02 DIAGNOSIS — R310 Gross hematuria: Secondary | ICD-10-CM | POA: Insufficient documentation

## 2017-03-10 IMAGING — CT CT L SPINE W/O CM
1 of 7 series · 6 of 14 positions shown, 8 images · non-contrast
Comparison: CT of the lumbar spine 06/07/2014.

CLINICAL DATA: Low back pain extending into both hips. Lumbosacral
radiculitis.

EXAM:
CT LUMBAR SPINE WITHOUT CONTRAST
TECHNIQUE: Multidetector CT imaging of the lumbar spine was performed without
intravenous contrast administration. Multiplanar CT image
reconstructions were also generated.

[Series 4: l spine soft · axial · 0.37mm/px · z∈[-934,-740]mm · 6 of 137 slices shown, 8 images]
[im 20/137  soft-tissue]
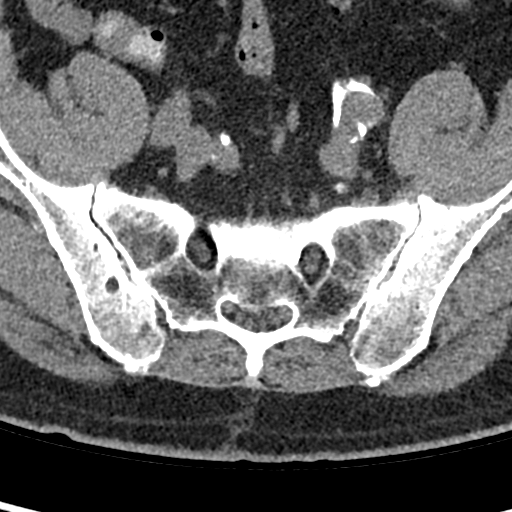
[im 20/137  bone]
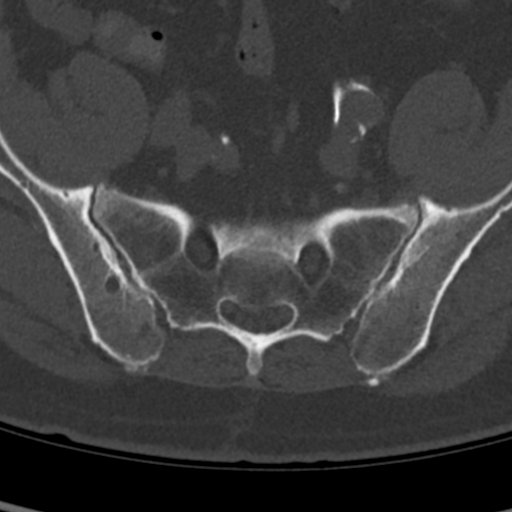
[im 39/137  bone]
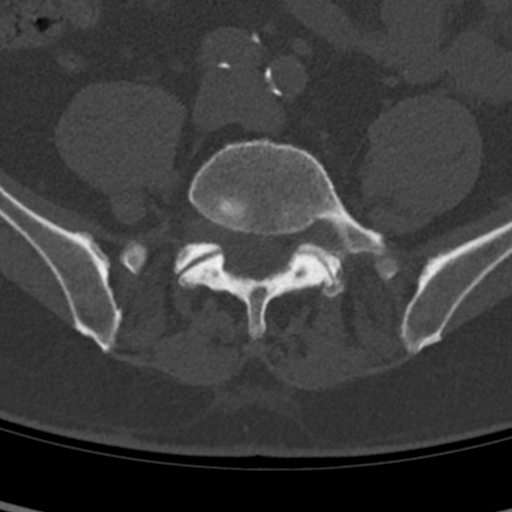
[im 59/137  bone]
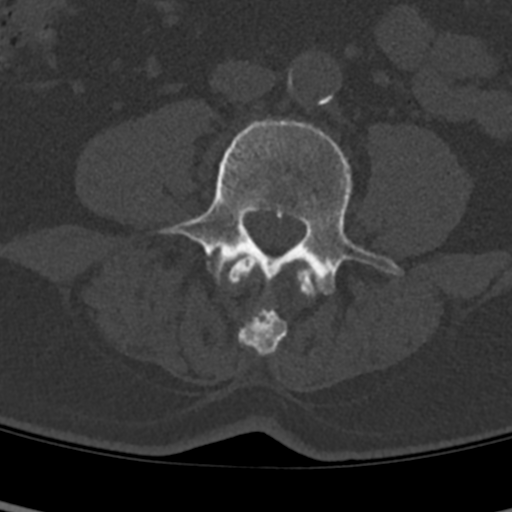
[im 78/137  bone]
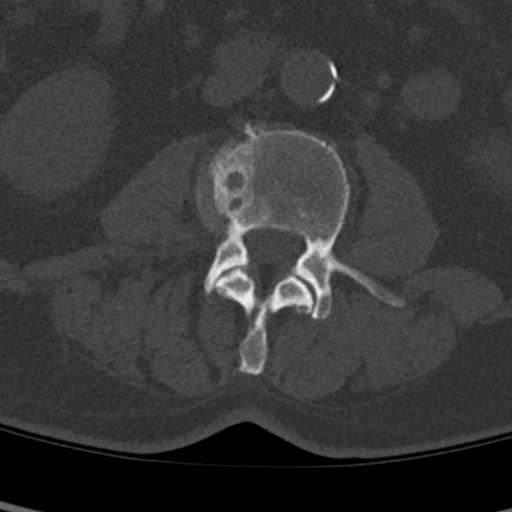
[im 98/137  soft-tissue]
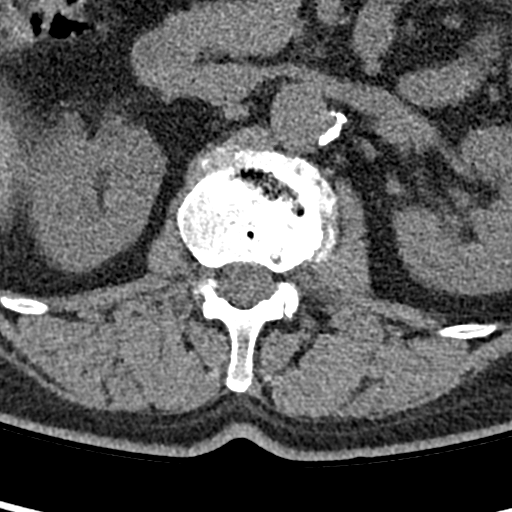
[im 98/137  bone]
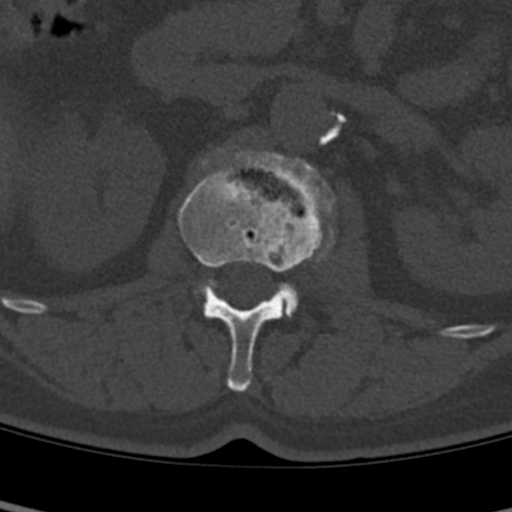
[im 117/137  bone]
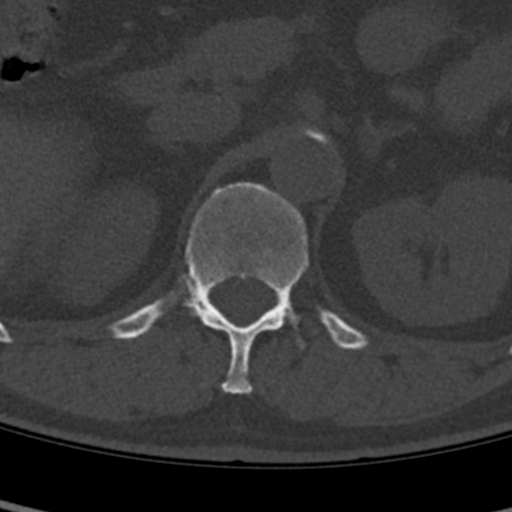

[6 of 14 positions shown; findings below may reference images not displayed]

FINDINGS: Scoliosis of the lumbar spine is convex to the left at L3-4.
Asymmetric endplate degenerative changes have progressed on the
right at L3-4 with sclerosis and subchondral cyst. Endplate
degenerative changes have progressed diffusely at L2-3, worse on the
right. A vacuum disc is again noted at L4-5. Degenerative changes
have progressed in the SI joints bilaterally.

Grade 1 anterolisthesis at L4-5 is stable. Slight retrolisthesis at
L1-2 and L2-3 is unchanged.

Limited imaging of the abdomen demonstrates atherosclerotic changes
in the aorta and branch vessels. A punctate nonobstructing stone the
medial right kidney is stable.

L1-2: A leftward disc protrusion and endplate spurring is noted.
Bilateral foraminal stenosis is stable.

L2-3: A broad-based disc protrusion and moderate facet hypertrophy
is again noted. Mild subarticular and foraminal stenosis is present
bilaterally.

L3-4: A broad-based disc protrusion is asymmetric to the right.
Moderate right subarticular and foraminal stenosis is stable. Mild
to moderate left foraminal narrowing is present as well.

L4-5: A broad-based disc protrusion is present. Mild subarticular
and foraminal narrowing is again noted, worse on the right. Next
item

L5-S1: A broad-based disc protrusion is asymmetric to the right.
Moderate right subarticular and foraminal narrowing is again seen.
Mild left foraminal narrowing is present.
IMPRESSION: 1. Progressive scoliosis and endplate degenerative changes in the
lumbar spine.
2. Multilevel subarticular and foraminal narrowing as described.

## 2017-03-25 DIAGNOSIS — N323 Diverticulum of bladder: Secondary | ICD-10-CM | POA: Insufficient documentation

## 2017-03-25 DIAGNOSIS — N210 Calculus in bladder: Secondary | ICD-10-CM | POA: Insufficient documentation

## 2017-06-11 ENCOUNTER — Other Ambulatory Visit: Payer: Self-pay | Admitting: Internal Medicine

## 2017-06-11 NOTE — Telephone Encounter (Signed)
Pt last saw Dr [name] 06/30/16, has upcoming appt on 06/29/17 with Dr [name], last labs 12/25/16 Creat 0.92, weight 83.9kg, age 76, based on specified criteria pt is on appropriate dosage of Eliquis 5mg  BID.  Will refill rx.

## 2017-06-29 ENCOUNTER — Ambulatory Visit (INDEPENDENT_AMBULATORY_CARE_PROVIDER_SITE_OTHER): Payer: Medicare Other | Admitting: Internal Medicine

## 2017-06-29 ENCOUNTER — Encounter: Payer: Self-pay | Admitting: Internal Medicine

## 2017-06-29 VITALS — BP 116/60 | HR 61 | Ht 66.0 in | Wt 188.5 lb

## 2017-06-29 DIAGNOSIS — Z95810 Presence of automatic (implantable) cardiac defibrillator: Secondary | ICD-10-CM | POA: Diagnosis not present

## 2017-06-29 DIAGNOSIS — I5022 Chronic systolic (congestive) heart failure: Secondary | ICD-10-CM | POA: Diagnosis not present

## 2017-06-29 DIAGNOSIS — I442 Atrioventricular block, complete: Secondary | ICD-10-CM

## 2017-06-29 DIAGNOSIS — I2589 Other forms of chronic ischemic heart disease: Secondary | ICD-10-CM

## 2017-06-29 DIAGNOSIS — I255 Ischemic cardiomyopathy: Secondary | ICD-10-CM | POA: Diagnosis not present

## 2017-06-29 DIAGNOSIS — I480 Paroxysmal atrial fibrillation: Secondary | ICD-10-CM

## 2017-06-29 NOTE — Progress Notes (Signed)
Patient Care Team: Derinda Late, MD as PCP - General (Family Medicine) Rocco Serene, MD (Unknown Physician Specialty)   HPI  [name]. is a 76 y.o. male Seen in followup for ICD implantation initially for syncope in the setting of depressed left ventricular function. He had a 6949-lead. He reached ERI summer 2012 and underwent device explantation with new defibrillator lead insertion with left ventricular lead placement and generator replacement.   He underwent device generator replacement again 3/17   He has a history of ischemic cardiomyopathy with prior stenting of an LAD. Catheterization 2012 demonstrated a patent stent and nonobstructive coronary disease. At that the time ejection fraction was 40%; He underwent stenting in [place] around 2007; Plavix was stopped 2014  Repeat Echo 12/15 Normal LV function   6/18 LDL 72 creatinine 0.9 K4.3 hemoglobin 14.0  Date K Cr Hgb  6/18 4.3 0.9 14         He has atrial fibrillation.  His thromboembolic risk profile is notable for TIA, hypertension, age , vascular disease and heart failure. His CHADS-2 score his 4 and his CHADS-VASc score is 6  Is on NOAC.   No SOB or chest pain, x worsening exercise tolerance which he ascribes to the being laid out by his hematuria surgery  No vt        Past Medical History:  Diagnosis Date  . AICD (automatic cardioverter/defibrillator) present   . Allergic rhinitis   . Arthritis   . Asthma   . Biventricular defibrillator-Medtronic    Generator replacement 2012 with CRT upgrade  . BPH (benign prostatic hyperplasia)   . Cardiac arrest ([place])   . Dyspnea    PFT 12/19/10: FEV1 2.93(107%), FEV1% 72, TLC 8.02(138%), DLCO 110%, +BD  . ED (erectile dysfunction)   . GERD (gastroesophageal reflux disease)   . GERD (gastroesophageal reflux disease)   . High-grade heart block   . History of kidney stones   . Hyperlipidemia   . Hypertension   . ischemic cardiomyopathy    stent to a  95%-occluded LAD in 1995; catheterization in 2012 demonstrated patent grafts and nonobstructive disease  . Myocardial infarction ([place])   . Sciatic nerve pain   . Severe sinus bradycardia   . Syncope       . TIA (transient ischemic attack) nov. 2011    Past Surgical History:  Procedure Laterality Date  . CATARACT EXTRACTION, BILATERAL    . CORONARY ANGIOPLASTY    . EP IMPLANTABLE DEVICE N/A 03/18/2016   Procedure:  BiV ICD Generator Changeout;  Surgeon: [name], MD;  Location: [place] [facility];  Service: Cardiovascular;  Laterality: N/A;  . EYE SURGERY Bilateral    cataract extraction  . Implantation of Medtronic dual-chamber cardioverter    . JOINT REPLACEMENT Left    total knee replacement  . Left Knee Replacement  2006  . PROSTATE BIOPSY    . TONSILLECTOMY  1948  . TOTAL KNEE REVISION Left 06/20/2015   Procedure: TOTAL KNEE REVISION;  Surgeon: [name], MD;  Location: [facility] ORS;  Service: Orthopedics;  Laterality: Left;    Current Outpatient Prescriptions  Medication Sig Dispense Refill  . albuterol (PROVENTIL HFA;VENTOLIN HFA) 108 (90 Base) MCG/ACT inhaler Inhale 2 puffs into the lungs every 6 (six) hours as needed for wheezing or shortness of breath. 1 Inhaler 2  . amoxicillin (AMOXIL) 500 MG capsule Take 500 mg by mouth 2 (two) times daily. DENTIST APPT.    [name] atorvastatin (LIPITOR) 80  MG tablet Take 1 tablet (80 mg total) by mouth daily. 90 tablet 3  . bisoprolol (ZEBETA) 5 MG tablet Take 1 tablet (5 mg total) by mouth daily. 30 tablet 6  . brimonidine (ALPHAGAN) 0.2 % ophthalmic solution Apply 0.2 drops to eye 2 (two) times daily.    . Calcium Citrate-Vitamin D (CITRACAL + D PO) Take by mouth daily.    [name] dutasteride (AVODART) 0.5 MG capsule Take 0.5 mg by mouth 3 (three) times a week.      [name] ELIQUIS 5 MG TABS tablet TAKE ONE TABLET BY MOUTH TWICE DAILY 180 tablet 1  . latanoprost (XALATAN) 0.005 % ophthalmic solution Place 1 drop into the right eye at bedtime.      [name] losartan (COZAAR) 50 MG tablet Take 50 mg by mouth daily.    . Multiple Vitamin (MULTIVITAMIN) tablet Take 1 tablet by mouth daily.      . ranitidine (ZANTAC) 75 MG tablet Take 75 mg by mouth at bedtime.     . traZODone (DESYREL) 100 MG tablet Take 100 mg by mouth at bedtime.     [name] zolpidem (AMBIEN) 5 MG tablet Take 10 mg by mouth at bedtime as needed for sleep.      No current facility-administered medications for this visit.     Allergies  Allergen Reactions  . Beta Adrenergic Blockers Other (See Comments)    Fatigue  . Oxycodone Nausea And Vomiting  . Sulfonamide Derivatives Nausea And Vomiting    Review of Systems negative except from HPI and PMH  Physical Exam BP 116/60 (BP Location: Left Arm, Patient Position: Sitting, Cuff Size: Normal)   Pulse 61   Ht 5\' 6"  (1.676 m)   Wt 188 lb 8 oz (85.5 kg)   BMI 30.42 kg/m  Well developed and nourished in no acute distress HENT normal Neck supple with JVP-flat Carotids brisk and full without bruits Clear Device pocket well healed; without hematoma or erythema.  There is no tethering  Regular rate and rhythm, no murmurs or gallops Abd-soft with active BS without hepatomegaly No Clubbing cyanosis edema Skin-warm and dry A & Oriented  Grossly normal sensory and motor function   ECG demonstrates AV biventricular pacing   Assessment and  Plan \ Ischemic Cardiomyopathy -- interval normalization  Complete heart block    Implantable defibrillator Medtronic  The patient's device was interrogated.  The information was reviewed. No changes were made in the programming.       Atrial fibrillation    Hypertension  HFrEF  Hyperlipidemia    Euvolemic continue current meds  On Anticoagulation;  No bleeding issues   Without symptoms of ischemia  BP well controlled   LDL at goal

## 2017-06-29 NOTE — Patient Instructions (Signed)
Medication Instructions: - Your physician recommends that you continue on your current medications as directed. Please refer to the Current Medication list given to you today.  Labwork: - none ordered  Procedures/Testing: - none ordered  Follow-Up: - Remote monitoring is used to monitor your Pacemaker of ICD from home. This monitoring reduces the number of office visits required to check your device to one time per year. It allows [place] to keep an eye on the functioning of your device to ensure it is working properly. You are scheduled for a device check from home on 09/28/17. You may send your transmission at any time that day. If you have a wireless device, the transmission will be sent automatically. After your physician reviews your transmission, you will receive a postcard with your next transmission date.  - Your physician wants you to follow-up in: 1 year with Dr. [name]. You will receive a reminder letter in the mail two months in advance. If you don't receive a letter, please call our office to schedule the follow-up appointment.   Any Additional Special Instructions Will Be Listed Below (If Applicable).     If you need a refill on your cardiac medications before your next appointment, please call your pharmacy.

## 2017-07-06 LAB — CUP PACEART INCLINIC DEVICE CHECK
Battery Remaining Longevity: 66 mo
Battery Voltage: 2.98 V
Brady Statistic AP VP Percent: 99.92 %
Brady Statistic AP VS Percent: 0.01 %
Brady Statistic AS VP Percent: 0.07 %
Brady Statistic AS VS Percent: 0 %
Brady Statistic RA Percent Paced: 98.46 %
Brady Statistic RV Percent Paced: 99.09 %
Date Time Interrogation Session: 20180710141324
HighPow Impedance: 44 Ohm
HighPow Impedance: 54 Ohm
Implantable Lead Implant Date: 20070615
Implantable Lead Implant Date: 20120813
Implantable Lead Implant Date: 20120813
Implantable Lead Location: 753858
Implantable Lead Location: 753859
Implantable Lead Location: 753860
Implantable Lead Model: 4396
Implantable Lead Model: 5076
Implantable Lead Model: 7121
Implantable Pulse Generator Implant Date: 20170329
Lead Channel Impedance Value: 228 Ohm
Lead Channel Impedance Value: 361 Ohm
Lead Channel Impedance Value: 361 Ohm
Lead Channel Impedance Value: 456 Ohm
Lead Channel Impedance Value: 551 Ohm
Lead Channel Impedance Value: 874 Ohm
Lead Channel Pacing Threshold Amplitude: 0.75 V
Lead Channel Pacing Threshold Amplitude: 1 V
Lead Channel Pacing Threshold Amplitude: 1 V
Lead Channel Pacing Threshold Pulse Width: 0.4 ms
Lead Channel Pacing Threshold Pulse Width: 0.4 ms
Lead Channel Pacing Threshold Pulse Width: 0.8 ms
Lead Channel Setting Pacing Amplitude: 1.75 V
Lead Channel Setting Pacing Amplitude: 2 V
Lead Channel Setting Pacing Amplitude: 2.5 V
Lead Channel Setting Pacing Pulse Width: 0.4 ms
Lead Channel Setting Pacing Pulse Width: 0.8 ms
Lead Channel Setting Sensing Sensitivity: 0.3 mV

## 2017-09-28 ENCOUNTER — Ambulatory Visit (INDEPENDENT_AMBULATORY_CARE_PROVIDER_SITE_OTHER): Payer: Medicare Other | Admitting: *Deleted

## 2017-09-28 DIAGNOSIS — I255 Ischemic cardiomyopathy: Secondary | ICD-10-CM | POA: Diagnosis not present

## 2017-09-28 DIAGNOSIS — I5022 Chronic systolic (congestive) heart failure: Secondary | ICD-10-CM

## 2017-09-29 NOTE — Progress Notes (Signed)
Remote ICD transmission.   

## 2017-09-30 LAB — CUP PACEART REMOTE DEVICE CHECK
Battery Remaining Longevity: 61 mo
Battery Voltage: 2.98 V
Date Time Interrogation Session: 20181009101706
HIGH POWER IMPEDANCE MEASURED VALUE: 43 Ohm
HIGH POWER IMPEDANCE MEASURED VALUE: 50 Ohm
Implantable Lead Implant Date: 20070615
Implantable Lead Location: 753858
Implantable Lead Location: 753860
Implantable Lead Model: 4396
Lead Channel Impedance Value: 228 Ohm
Lead Channel Impedance Value: 361 Ohm
Lead Channel Impedance Value: 418 Ohm
Lead Channel Sensing Intrinsic Amplitude: 7.5 mV
Lead Channel Setting Pacing Amplitude: 2 V
Lead Channel Setting Pacing Amplitude: 2.5 V
Lead Channel Setting Sensing Sensitivity: 0.3 mV
MDC IDC LEAD IMPLANT DT: 20120813
MDC IDC LEAD IMPLANT DT: 20120813
MDC IDC LEAD LOCATION: 753859
MDC IDC MSMT LEADCHNL LV IMPEDANCE VALUE: 513 Ohm
MDC IDC MSMT LEADCHNL LV IMPEDANCE VALUE: 817 Ohm
MDC IDC MSMT LEADCHNL RA IMPEDANCE VALUE: 342 Ohm
MDC IDC MSMT LEADCHNL RA SENSING INTR AMPL: 1 mV
MDC IDC MSMT LEADCHNL RA SENSING INTR AMPL: 1 mV
MDC IDC MSMT LEADCHNL RV PACING THRESHOLD AMPLITUDE: 0.875 V
MDC IDC MSMT LEADCHNL RV PACING THRESHOLD PULSEWIDTH: 0.4 ms
MDC IDC MSMT LEADCHNL RV SENSING INTR AMPL: 5.75 mV
MDC IDC PG IMPLANT DT: 20170329
MDC IDC SET LEADCHNL LV PACING AMPLITUDE: 1.75 V
MDC IDC SET LEADCHNL LV PACING PULSEWIDTH: 0.8 ms
MDC IDC SET LEADCHNL RV PACING PULSEWIDTH: 0.4 ms
MDC IDC STAT BRADY AP VP PERCENT: 99.94 %
MDC IDC STAT BRADY AP VS PERCENT: 0.01 %
MDC IDC STAT BRADY AS VP PERCENT: 0.05 %
MDC IDC STAT BRADY AS VS PERCENT: 0 %
MDC IDC STAT BRADY RA PERCENT PACED: 99.94 %
MDC IDC STAT BRADY RV PERCENT PACED: 99.41 %

## 2017-10-01 ENCOUNTER — Encounter: Payer: Self-pay | Admitting: Cardiology

## 2017-12-06 ENCOUNTER — Telehealth: Payer: Self-pay

## 2017-12-06 NOTE — Telephone Encounter (Signed)
   [facility] Pre-operative Risk Assessment    Request for surgical clearance:  1. What type of surgery is being performed? Colonoscopy and/or Upper Endoscopy   2. When is this surgery scheduled? 03-09-18   3. Are there any medications that need to be held prior to surgery and how long? Eliquis x 3 days   4. Practice name and name of physician performing surgery? [facility]ology Dept   5. What is your office phone and fax number? Phone: [phone] Fax: [phone]   6. Anesthesia type (None, local, MAC, general) ? n/a   [name] 12/06/2017, 1:28 PM  _________________________________________________________________   (provider comments below)

## 2017-12-06 NOTE — Telephone Encounter (Signed)
   Primary EP: Dr. [name]  Chart reviewed as part of pre-operative protocol coverage. Because of Ronnie TIBBS Jr.'s past medical history and time since last visit, he/she will require a follow-up visit in order to better assess preoperative cardiovascular risk. Extremely complex PMH including CAD with prior occluded LAD, ICM, chronic CHF, TIA. Colonoscopy is not scheduled until 02/2018 per request. I would advise given his complexity instead of clearing 3 months in advance via phone, he have a f/u appointment with EP APP/Klein closer to before procedure to discuss stopping Eliquis and general clearance.  Pre-op covering staff: - Please schedule appointment and call patient to inform them. - Please contact requesting surgeon's office via preferred method (i.e, phone, fax) to inform them of need for appointment prior to surgery.  [name], PA-C  12/06/2017, 4:50 PM

## 2017-12-07 NOTE — Telephone Encounter (Signed)
Called patient to set up an appointment With Dr. [name] closer to his procedure date. Patient's wife stated to call back tomorrow (12/08/2017) and do not call before 10am. Told the patient's wife I would make a note and someone would be reaching out to,orrow to schedule an appointment.

## 2017-12-10 ENCOUNTER — Telehealth: Payer: Self-pay | Admitting: *Deleted

## 2017-12-10 NOTE — Telephone Encounter (Signed)
   [facility] Pre-operative Risk Assessment    Request for surgical clearance:  1. What type of surgery is being performed? Colonoscopy   2. When is this surgery scheduled? 03-09-17    3. Are there any medications that need to be held prior to surgery and how long? Eliquis   4. Practice name and name of physician performing surgery? [facility] Clearance       Dr. Denice Paradise  5. What is your office phone and fax number? [phone] Fax: [phone]  6. Anesthesia type (None, local, MAC, general) ?  General   [name] M 12/10/2017, 4:14 PM  _________________________________________________________________   (provider comments below)

## 2017-12-10 NOTE — Telephone Encounter (Signed)
I tried to reach pt and s/w pt's wife who asked if I had pt's cell #. I answered yes. I then tried to reach pt on his cell, [email or web address] so that we may schedule an appt to see Dr. [name] early March 2019 to clear for his colonoscopy.

## 2017-12-15 NOTE — Telephone Encounter (Signed)
  His RCRI Class IV RISK, 11% risk of major cardiac event. Likely moderate to risk patient for a very low risk procedure. However Unable to Reach Patient, left message for the patient to call back. During mean time, will forward to pharmacy pool for advise on eliquis  Waiting for patient to call back

## 2017-12-16 NOTE — Telephone Encounter (Signed)
Patient with diagnosis of atrial fibrillation on Eliquis for anticoagulation.    Procedure: colonoscopy Date of procedure: 03/09/18  CHADS2-VASc score of  4 (CHF, HTN, AGE x 2)  CrCl 85 Platelet count 130  Per office protocol, patient can hold Eliquis for 2 days prior to procedure.   Patient will not need bridging with Lovenox (enoxaparin) around procedure.  Patient should restart Eliquis on the evening of procedure or day after, at discretion of procedure MD

## 2017-12-20 NOTE — Telephone Encounter (Addendum)
   Chart reviewed as part of pre-operative protocol coverage. Pre-op clearance already addressed in 12/06/17 phone note. Has extremely complex PMH including CAD with prior occluded LAD s/p PCI, ICM, cardiac arrest, chronic CHF, TIA, BiV-ICD, device lead issues, PAF. Colonoscopy is not scheduled until 02/2018 per request.   Per phone note 12/06/17, instead of clearing 3 months in advance by phone notes, it was recommended he have appointment with EP APP/Dr. [name] closer to schedule date of procedure to discuss stopping Eliquis and general clearance.  I see his appointment was scheduled for 12/24/17. This seems somewhat close. If patient prefers sooner appointment due to concerns, can keep this date. Otherwise please move this appointment closer to procedure date (Feb-March 2019) so that recommendations are most up to date. Please contact requesting party's office to make them aware of this recommendation since this is a repeat clearance request.  [name], PA-C 12/20/2017, 2:15 PM

## 2017-12-20 NOTE — Telephone Encounter (Signed)
[name], EP Scheduler, is contacting the pt to move his appt back towards end of February - 1st of March.

## 2017-12-20 NOTE — Telephone Encounter (Signed)
Pt scheduled to see Dr. [name] 12/24/17.

## 2017-12-24 ENCOUNTER — Encounter: Payer: Medicare Other | Admitting: Internal Medicine

## 2017-12-24 DIAGNOSIS — M1810 Unilateral primary osteoarthritis of first carpometacarpal joint, unspecified hand: Secondary | ICD-10-CM | POA: Insufficient documentation

## 2017-12-28 ENCOUNTER — Ambulatory Visit (INDEPENDENT_AMBULATORY_CARE_PROVIDER_SITE_OTHER): Payer: Medicare Other | Admitting: *Deleted

## 2017-12-28 DIAGNOSIS — I255 Ischemic cardiomyopathy: Secondary | ICD-10-CM | POA: Diagnosis not present

## 2017-12-28 DIAGNOSIS — I5022 Chronic systolic (congestive) heart failure: Secondary | ICD-10-CM

## 2017-12-28 NOTE — Progress Notes (Signed)
Remote ICD transmission.   

## 2017-12-29 ENCOUNTER — Encounter: Payer: Self-pay | Admitting: Cardiology

## 2017-12-29 LAB — CUP PACEART REMOTE DEVICE CHECK
Battery Remaining Longevity: 56 mo
Battery Voltage: 2.97 V
Brady Statistic AP VS Percent: 0.01 %
Brady Statistic AS VP Percent: 0.24 %
Brady Statistic AS VS Percent: 0 %
Brady Statistic RA Percent Paced: 99.45 %
Brady Statistic RV Percent Paced: 99.68 %
HIGH POWER IMPEDANCE MEASURED VALUE: 44 Ohm
HighPow Impedance: 54 Ohm
Implantable Lead Implant Date: 20070615
Implantable Lead Implant Date: 20120813
Implantable Lead Location: 753858
Implantable Lead Location: 753859
Implantable Lead Model: 7121
Lead Channel Impedance Value: 228 Ohm
Lead Channel Impedance Value: 361 Ohm
Lead Channel Impedance Value: 475 Ohm
Lead Channel Impedance Value: 779 Ohm
Lead Channel Pacing Threshold Amplitude: 0.875 V
Lead Channel Pacing Threshold Pulse Width: 0.4 ms
Lead Channel Sensing Intrinsic Amplitude: 0.75 mV
Lead Channel Sensing Intrinsic Amplitude: 0.75 mV
Lead Channel Setting Pacing Amplitude: 2 V
Lead Channel Setting Pacing Amplitude: 2.5 V
Lead Channel Setting Pacing Pulse Width: 0.4 ms
Lead Channel Setting Pacing Pulse Width: 0.8 ms
MDC IDC LEAD IMPLANT DT: 20120813
MDC IDC LEAD LOCATION: 753860
MDC IDC MSMT LEADCHNL LV IMPEDANCE VALUE: 418 Ohm
MDC IDC MSMT LEADCHNL RV IMPEDANCE VALUE: 361 Ohm
MDC IDC MSMT LEADCHNL RV SENSING INTR AMPL: 5.75 mV
MDC IDC MSMT LEADCHNL RV SENSING INTR AMPL: 7.5 mV
MDC IDC PG IMPLANT DT: 20170329
MDC IDC SESS DTM: 20190108083424
MDC IDC SET LEADCHNL LV PACING AMPLITUDE: 1.75 V
MDC IDC SET LEADCHNL RV SENSING SENSITIVITY: 0.3 mV
MDC IDC STAT BRADY AP VP PERCENT: 99.76 %

## 2018-02-21 ENCOUNTER — Ambulatory Visit (INDEPENDENT_AMBULATORY_CARE_PROVIDER_SITE_OTHER): Payer: Medicare Other | Admitting: Internal Medicine

## 2018-02-21 ENCOUNTER — Encounter: Payer: Self-pay | Admitting: Internal Medicine

## 2018-02-21 VITALS — BP 148/88 | HR 65 | Ht 66.0 in | Wt 183.0 lb

## 2018-02-21 DIAGNOSIS — I255 Ischemic cardiomyopathy: Secondary | ICD-10-CM | POA: Diagnosis not present

## 2018-02-21 DIAGNOSIS — I5022 Chronic systolic (congestive) heart failure: Secondary | ICD-10-CM | POA: Diagnosis not present

## 2018-02-21 DIAGNOSIS — I442 Atrioventricular block, complete: Secondary | ICD-10-CM

## 2018-02-21 DIAGNOSIS — Z95810 Presence of automatic (implantable) cardiac defibrillator: Secondary | ICD-10-CM | POA: Diagnosis not present

## 2018-02-21 NOTE — Patient Instructions (Signed)
Medication Instructions:  Your physician recommends that you continue on your current medications as directed. Please refer to the Current Medication list given to you today.  Labwork: None ordered.  Testing/Procedures: None ordered.  Follow-Up: Your physician recommends that you schedule a follow-up appointment in:   Remote monitoring is used to monitor your ICD from home. This monitoring reduces the number of office visits required to check your device to one time per year. It allows [place] to keep an eye on the functioning of your device to ensure it is working properly. You are scheduled for a device check from home on 03/29/2018. You may send your transmission at any time that day. If you have a wireless device, the transmission will be sent automatically. After your physician reviews your transmission, you will receive a postcard with your next transmission date.    Any Other Special Instructions Will Be Listed Below (If Applicable).     If you need a refill on your cardiac medications before your next appointment, please call your pharmacy.

## 2018-02-21 NOTE — Progress Notes (Signed)
Patient Care Team: Derinda Late, MD as PCP - General (Family Medicine) Rocco Serene, MD (Unknown Physician Specialty)   HPI  [name]. is a 77 y.o. male Seen in followup for ICD implantation initially for syncope in the setting of depressed left ventricular function. He had a 6949-lead. He reached ERI summer 2012 and underwent device explantation with new defibrillator lead insertion with left ventricular lead placement and generator replacement.   He underwent device generator replacement again 3/17      DATE TEST EF   2012 Cath  40 % Patent LAD stent  12/15 Echo   55-65 %           Date K Cr Hgb  6/18 4.3 0.9 14  2/19 4.1 0.9 12.5   He has atrial fibrillation.   His thromboembolic risk profile is notable for TIA, hypertension, age , vascular disease and heart failure. His CHADS-2 score his 4 and his CHADS-VASc score is 6  Is on NOAC.  He has had no bleeding issues   He is here today in anticipation of colonoscopy and endoscopy.  He has had no significant changes in functional capacity Although is a little bit more tired;  he denies chest pain peripheral edema.   He and his wife are moving from their home of 35 years to  independent living.  It is exceedingly stressful.    Past Medical History:  Diagnosis Date  . AICD (automatic cardioverter/defibrillator) present   . Allergic rhinitis   . Arthritis   . Asthma   . Biventricular defibrillator-Medtronic    Generator replacement 2012 with CRT upgrade  . BPH (benign prostatic hyperplasia)   . Cardiac arrest ([place])   . Dyspnea    PFT 12/19/10: FEV1 2.93(107%), FEV1% 72, TLC 8.02(138%), DLCO 110%, +BD  . ED (erectile dysfunction)   . GERD (gastroesophageal reflux disease)   . GERD (gastroesophageal reflux disease)   . High-grade heart block   . History of kidney stones   . Hyperlipidemia   . Hypertension   . ischemic cardiomyopathy    stent to a 95%-occluded LAD in 1995; catheterization in 2012  demonstrated patent grafts and nonobstructive disease  . Myocardial infarction ([place])   . Sciatic nerve pain   . Severe sinus bradycardia   . Syncope       . TIA (transient ischemic attack) nov. 2011    Past Surgical History:  Procedure Laterality Date  . CATARACT EXTRACTION, BILATERAL    . CORONARY ANGIOPLASTY    . EP IMPLANTABLE DEVICE N/A 03/18/2016   Procedure:  BiV ICD Generator Changeout;  Surgeon: [name], MD;  Location: [facility];  Service: Cardiovascular;  Laterality: N/A;  . EYE SURGERY Bilateral    cataract extraction  . Implantation of Medtronic dual-chamber cardioverter    . JOINT REPLACEMENT Left    total knee replacement  . Left Knee Replacement  2006  . PROSTATE BIOPSY    . TONSILLECTOMY  1948  . TOTAL KNEE REVISION Left 06/20/2015   Procedure: TOTAL KNEE REVISION;  Surgeon: [name], MD;  Location: [facility];  Service: Orthopedics;  Laterality: Left;    Current Outpatient Medications  Medication Sig Dispense Refill  . albuterol (PROVENTIL HFA;VENTOLIN HFA) 108 (90 Base) MCG/ACT inhaler Inhale 2 puffs into the lungs every 6 (six) hours as needed for wheezing or shortness of breath. 1 Inhaler 2  . amoxicillin (AMOXIL) 500 MG capsule Take 500 mg by mouth 2 (two) times daily.  DENTIST APPT.    [name] atorvastatin (LIPITOR) 80 MG tablet Take 1 tablet (80 mg total) by mouth daily. 90 tablet 3  . Azelastine HCl 137 MCG/SPRAY SOLN Place 2 sprays into both nostrils daily.    . bisoprolol (ZEBETA) 5 MG tablet Take 1 tablet (5 mg total) by mouth daily. 30 tablet 6  . brimonidine (ALPHAGAN) 0.2 % ophthalmic solution Apply 0.2 drops to eye 2 (two) times daily.    . Calcium Citrate-Vitamin D (CITRACAL + D PO) Take by mouth daily.    [name] dutasteride (AVODART) 0.5 MG capsule Take 0.5 mg by mouth 3 (three) times a week.      [name] ELIQUIS 5 MG TABS tablet TAKE ONE TABLET BY MOUTH TWICE DAILY 180 tablet 1  . latanoprost (XALATAN) 0.005 % ophthalmic solution Place 1 drop into  the right eye at bedtime.     [name] losartan (COZAAR) 50 MG tablet Take 50 mg by mouth daily.    . metFORMIN (GLUCOPHAGE-XR) 500 MG 24 hr tablet Take 1 tablet by mouth daily.    . montelukast (SINGULAIR) 10 MG tablet Take 1 tablet by mouth daily.    . Multiple Vitamin (MULTIVITAMIN) tablet Take 1 tablet by mouth daily.      [name] omeprazole (PRILOSEC) 40 MG capsule Take 1 capsule by mouth daily.    . ranitidine (ZANTAC) 75 MG tablet Take 75 mg by mouth at bedtime.     . traZODone (DESYREL) 100 MG tablet Take 100 mg by mouth at bedtime.     [name] zolpidem (AMBIEN) 5 MG tablet Take 10 mg by mouth at bedtime as needed for sleep.      No current facility-administered medications for this visit.     Allergies  Allergen Reactions  . Beta Adrenergic Blockers Other (See Comments)    Fatigue  . Oxycodone Nausea And Vomiting  . Sulfonamide Derivatives Nausea And Vomiting    Review of Systems negative except from HPI and PMH  Physical Exam BP (!) 148/88   Pulse 65   Ht 5\' 6"  (1.676 m)   Wt 183 lb (83 kg)   SpO2 98%   BMI 29.54 kg/m  Well developed and nourished in no acute distress HENT normal Neck supple with JVP-flat Clear Device pocket well healed; without hematoma or erythema.  There is no tethering  Regular rate and rhythm, no murmurs or gallops Abd-soft with active BS No Clubbing cyanosis edema Skin-warm and dry A & Oriented  Grossly normal sensory and motor function   ECG demonstrates AV pacing with an RS in lead V1 and a R wave in lead I  intervals 19/15/47   Assessment and  Plan \ Ischemic Cardiomyopathy -- interval normalization  Complete heart block    Implantable defibrillator-CRT Medtronic  The patient's device was interrogated.  The information was reviewed. No changes were made in the programming.     Atrial fibrillation    Hypertension  HFpEF  Hyperlipidemia   Continue current statins.  Last LDL was 53  Blood pressure little bit elevated but at home it has  been normal.  No bleeding on anticoagulation; atrial fibrillation stable  Euvolemic continue current meds  Without symptoms of ischemia  Risks for colonoscopy and endoscopy are acceptable

## 2018-02-24 LAB — CUP PACEART INCLINIC DEVICE CHECK
Implantable Lead Implant Date: 20070615
Implantable Lead Implant Date: 20120813
Implantable Lead Location: 753858
Implantable Lead Model: 4396
Implantable Lead Model: 5076
Implantable Lead Model: 7121
MDC IDC LEAD IMPLANT DT: 20120813
MDC IDC LEAD LOCATION: 753859
MDC IDC LEAD LOCATION: 753860
MDC IDC PG IMPLANT DT: 20170329
MDC IDC SESS DTM: 20190307163912

## 2018-03-04 ENCOUNTER — Other Ambulatory Visit: Payer: Self-pay | Admitting: Internal Medicine

## 2018-03-04 NOTE — Telephone Encounter (Signed)
Eliquis 5mg  refill request received; pt is 77yrs old, wt-83kg, last seen by Dr. [name] on 02/21/18, Crea-0.90 on 12/10/17 via [facility]. Will send in refill to requested Pharmacy.

## 2018-03-29 ENCOUNTER — Ambulatory Visit (INDEPENDENT_AMBULATORY_CARE_PROVIDER_SITE_OTHER): Payer: Medicare Other | Admitting: *Deleted

## 2018-03-29 ENCOUNTER — Telehealth: Payer: Self-pay | Admitting: Cardiology

## 2018-03-29 DIAGNOSIS — I255 Ischemic cardiomyopathy: Secondary | ICD-10-CM | POA: Diagnosis not present

## 2018-03-29 NOTE — Progress Notes (Signed)
Remote ICD transmission.   

## 2018-03-29 NOTE — Telephone Encounter (Signed)
Opened in error

## 2018-03-31 ENCOUNTER — Encounter: Payer: Self-pay | Admitting: Cardiology

## 2018-04-01 ENCOUNTER — Ambulatory Visit: Payer: Medicare Other | Admitting: Anesthesiology

## 2018-04-01 ENCOUNTER — Ambulatory Visit
Admission: RE | Admit: 2018-04-01 | Discharge: 2018-04-01 | Disposition: A | Discharge status: Home / Self Care | Payer: Medicare Other | Source: Ambulatory Visit | Attending: Unknown Physician Specialty | Admitting: Unknown Physician Specialty

## 2018-04-01 ENCOUNTER — Encounter
Admission: RE | Disposition: A | Discharge status: Home / Self Care | Payer: Self-pay | Source: Ambulatory Visit | Attending: Unknown Physician Specialty

## 2018-04-01 ENCOUNTER — Encounter: Payer: Self-pay | Admitting: *Deleted

## 2018-04-01 DIAGNOSIS — N400 Enlarged prostate without lower urinary tract symptoms: Secondary | ICD-10-CM | POA: Insufficient documentation

## 2018-04-01 DIAGNOSIS — K635 Polyp of colon: Secondary | ICD-10-CM | POA: Insufficient documentation

## 2018-04-01 DIAGNOSIS — Z8674 Personal history of sudden cardiac arrest: Secondary | ICD-10-CM | POA: Diagnosis not present

## 2018-04-01 DIAGNOSIS — I252 Old myocardial infarction: Secondary | ICD-10-CM | POA: Diagnosis not present

## 2018-04-01 DIAGNOSIS — I10 Essential (primary) hypertension: Secondary | ICD-10-CM | POA: Insufficient documentation

## 2018-04-01 DIAGNOSIS — I255 Ischemic cardiomyopathy: Secondary | ICD-10-CM | POA: Insufficient documentation

## 2018-04-01 DIAGNOSIS — Z7984 Long term (current) use of oral hypoglycemic drugs: Secondary | ICD-10-CM | POA: Insufficient documentation

## 2018-04-01 DIAGNOSIS — K5790 Diverticulosis of intestine, part unspecified, without perforation or abscess without bleeding: Secondary | ICD-10-CM | POA: Diagnosis not present

## 2018-04-01 DIAGNOSIS — K2950 Unspecified chronic gastritis without bleeding: Secondary | ICD-10-CM | POA: Diagnosis not present

## 2018-04-01 DIAGNOSIS — Z79899 Other long term (current) drug therapy: Secondary | ICD-10-CM | POA: Insufficient documentation

## 2018-04-01 DIAGNOSIS — K640 First degree hemorrhoids: Secondary | ICD-10-CM | POA: Diagnosis not present

## 2018-04-01 DIAGNOSIS — Z7901 Long term (current) use of anticoagulants: Secondary | ICD-10-CM | POA: Diagnosis not present

## 2018-04-01 DIAGNOSIS — J45909 Unspecified asthma, uncomplicated: Secondary | ICD-10-CM | POA: Diagnosis not present

## 2018-04-01 DIAGNOSIS — K210 Gastro-esophageal reflux disease with esophagitis: Secondary | ICD-10-CM | POA: Insufficient documentation

## 2018-04-01 DIAGNOSIS — Z95810 Presence of automatic (implantable) cardiac defibrillator: Secondary | ICD-10-CM | POA: Insufficient documentation

## 2018-04-01 DIAGNOSIS — Z1211 Encounter for screening for malignant neoplasm of colon: Secondary | ICD-10-CM | POA: Diagnosis not present

## 2018-04-01 DIAGNOSIS — D120 Benign neoplasm of cecum: Secondary | ICD-10-CM | POA: Diagnosis not present

## 2018-04-01 DIAGNOSIS — E785 Hyperlipidemia, unspecified: Secondary | ICD-10-CM | POA: Insufficient documentation

## 2018-04-01 HISTORY — PX: ESOPHAGOGASTRODUODENOSCOPY (EGD) WITH PROPOFOL: SHX5813

## 2018-04-01 HISTORY — PX: COLONOSCOPY WITH PROPOFOL: SHX5780

## 2018-04-01 LAB — GLUCOSE, CAPILLARY: Glucose-Capillary: 72 mg/dL (ref 65–99)

## 2018-04-01 SURGERY — ESOPHAGOGASTRODUODENOSCOPY (EGD) WITH PROPOFOL
Anesthesia: General

## 2018-04-01 MED ORDER — PIPERACILLIN-TAZOBACTAM 3.375 G IVPB 30 MIN
3.3750 g | Freq: Once | INTRAVENOUS | Status: AC
Start: 2018-04-01 — End: 2018-04-01
  Administered 2018-04-01: 3.375 g via INTRAVENOUS
  Filled 2018-04-01: qty 50

## 2018-04-01 MED ORDER — GLYCOPYRROLATE 0.2 MG/ML IJ SOLN
INTRAMUSCULAR | Status: DC | PRN
  Administered 2018-04-01: 0.2 mg via INTRAVENOUS

## 2018-04-01 MED ORDER — PROPOFOL 10 MG/ML IV BOLUS
INTRAVENOUS | Status: DC | PRN
  Administered 2018-04-01 (×2): 10 mg via INTRAVENOUS
  Administered 2018-04-01: 30 mg via INTRAVENOUS

## 2018-04-01 MED ORDER — IPRATROPIUM-ALBUTEROL 0.5-2.5 (3) MG/3ML IN SOLN: 3.0000 mL | Freq: Once | RESPIRATORY_TRACT | Status: DC

## 2018-04-01 MED ORDER — GLYCOPYRROLATE 0.2 MG/ML IJ SOLN
INTRAMUSCULAR | Status: AC
  Filled 2018-04-01: qty 1

## 2018-04-01 MED ORDER — LIDOCAINE HCL (PF) 2 % IJ SOLN
INTRAMUSCULAR | Status: DC | PRN
  Administered 2018-04-01: 80 mg

## 2018-04-01 MED ORDER — PROPOFOL 500 MG/50ML IV EMUL
INTRAVENOUS | Status: DC | PRN
  Administered 2018-04-01: 50 ug/kg/min via INTRAVENOUS

## 2018-04-01 MED ORDER — SODIUM CHLORIDE 0.9 % IV SOLN: INTRAVENOUS | Status: DC

## 2018-04-01 MED ORDER — FENTANYL CITRATE (PF) 100 MCG/2ML IJ SOLN
INTRAMUSCULAR | Status: AC
  Filled 2018-04-01: qty 2

## 2018-04-01 MED ORDER — PROPOFOL 10 MG/ML IV BOLUS
INTRAVENOUS | Status: AC
  Filled 2018-04-01: qty 20

## 2018-04-01 MED ORDER — MIDAZOLAM HCL 2 MG/2ML IJ SOLN
INTRAMUSCULAR | Status: AC
  Filled 2018-04-01: qty 2

## 2018-04-01 MED ORDER — LIDOCAINE HCL (PF) 2 % IJ SOLN
INTRAMUSCULAR | Status: AC
  Filled 2018-04-01: qty 10

## 2018-04-01 MED ORDER — FENTANYL CITRATE (PF) 100 MCG/2ML IJ SOLN
INTRAMUSCULAR | Status: DC | PRN
  Administered 2018-04-01 (×2): 50 ug via INTRAVENOUS

## 2018-04-01 MED ORDER — MIDAZOLAM HCL 5 MG/5ML IJ SOLN
INTRAMUSCULAR | Status: DC | PRN
  Administered 2018-04-01: 2 mg via INTRAVENOUS

## 2018-04-01 MED ORDER — SODIUM CHLORIDE 0.9 % IV SOLN
INTRAVENOUS | Status: DC
  Administered 2018-04-01: 13:00:00 via INTRAVENOUS

## 2018-04-01 MED ORDER — EPHEDRINE SULFATE 50 MG/ML IJ SOLN
INTRAMUSCULAR | Status: DC | PRN
  Administered 2018-04-01: 10 mg via INTRAVENOUS
  Administered 2018-04-01: 5 mg via INTRAVENOUS
  Administered 2018-04-01: 10 mg via INTRAVENOUS

## 2018-04-01 NOTE — Op Note (Signed)
[facility] Patient Name: [name] Procedure Date: 04/01/2018 1:20 PM MRN: [id number] Account #: [id number] Date of Birth: 1941/03/17 Admit Type: Outpatient Age: 77 Room: [facility] ENDO ROOM 1 Gender: Male Note Status: Finalized Procedure:            Upper GI endoscopy Indications:          Suspected gastro-esophageal reflux disease Providers:            [name], MD Referring MD:         Caprice Renshaw MD (Referring MD) Medicines:            Propofol per Anesthesia Complications:        No immediate complications. Procedure:            Pre-Anesthesia Assessment:                       - After reviewing the risks and benefits, the patient                        was deemed in satisfactory condition to undergo the                        procedure.                       After obtaining informed consent, the endoscope was                        passed under direct vision. Throughout the procedure,                        the patient's blood pressure, pulse, and oxygen                        saturations were monitored continuously. The Endoscope                        was introduced through the mouth, and advanced to the                        second part of duodenum. The upper GI endoscopy was                        accomplished without difficulty. The patient tolerated                        the procedure well. Findings:      LA Grade A (one or more mucosal breaks less than 5 mm, not extending       between tops of 2 mucosal folds) esophagitis with no bleeding was found       42 cm from the incisors. Biopsies were taken with a cold forceps for       histology.      A single 2 mm polyp with no bleeding was found 40 cm from the incisors.       Biopsies were taken with a cold forceps for histology.      Diffuse mildly erythematous mucosa without bleeding was found in the       gastric body and in the gastric antrum. Biopsies were taken  with a cold  forceps for histology. Biopsies were taken with a cold forceps for       Helicobacter pylori testing.      The examined duodenum was normal. Impression:           - LA Grade A reflux esophagitis. Rule out Barrett's                        esophagus. Biopsied.                       - Esophageal polyp(s) were found. Biopsied.                       - Erythematous mucosa in the gastric body and antrum.                        Biopsied.                       - Normal examined duodenum. Recommendation:       - Await pathology results. [name], MD 04/01/2018 1:37:31 PM This report has been signed electronically. Number of Addenda: 0 Note Initiated On: 04/01/2018 1:20 PM      [facility]

## 2018-04-01 NOTE — Anesthesia Preprocedure Evaluation (Signed)
Anesthesia Evaluation  Patient identified by MRN, date of birth, ID band Patient awake    Reviewed: Allergy & Precautions, H&P , NPO status , Patient's Chart, lab work & pertinent test results  History of Anesthesia Complications Negative for: history of anesthetic complications  Airway Mallampati: III  TM Distance: <3 FB Neck ROM: limited    Dental  (+) Chipped, Poor Dentition   Pulmonary neg shortness of breath, asthma ,           Cardiovascular Exercise Tolerance: Good hypertension, (-) angina+ CAD, + Past MI and + Cardiac Stents  (-) DOE + dysrhythmias + pacemaker (medtronic) + Cardiac Defibrillator      Neuro/Psych TIA Neuromuscular disease negative psych ROS   GI/Hepatic Neg liver ROS, GERD  Medicated and Controlled,  Endo/Other  negative endocrine ROS  Renal/GU negative Renal ROS  negative genitourinary   Musculoskeletal  (+) Arthritis ,   Abdominal   Peds  Hematology negative hematology ROS (+)   Anesthesia Other Findings Past Medical History: No date: AICD (automatic cardioverter/defibrillator) present No date: Allergic rhinitis No date: Arthritis No date: Asthma No date: Biventricular defibrillator-Medtronic     Comment:  Generator replacement 2012 with CRT upgrade No date: BPH (benign prostatic hyperplasia) No date: Cardiac arrest ([place]) No date: Dyspnea     Comment:  PFT 12/19/10: FEV1 2.93(107%), FEV1% 72, TLC 8.02(138%),              DLCO 110%, +BD No date: ED (erectile dysfunction) No date: GERD (gastroesophageal reflux disease) No date: GERD (gastroesophageal reflux disease) No date: High-grade heart block No date: History of kidney stones No date: Hyperlipidemia No date: Hypertension No date: ischemic cardiomyopathy     Comment:  stent to a 95%-occluded LAD in 1995; catheterization in               2012 demonstrated patent grafts and nonobstructive               disease No date:  Myocardial infarction ([place]) No date: Sciatic nerve pain No date: Severe sinus bradycardia No date: Syncope     Comment:    nov. 2011: TIA (transient ischemic attack)  Past Surgical History: No date: CATARACT EXTRACTION, BILATERAL No date: CORONARY ANGIOPLASTY 03/18/2016: EP IMPLANTABLE DEVICE; N/A     Comment:  Procedure:  BiV ICD Generator Changeout;  Surgeon:               [name], MD;  Location: [place] [facility];                Service: Cardiovascular;  Laterality: N/A; No date: EYE SURGERY; Bilateral     Comment:  cataract extraction No date: Implantation of Medtronic dual-chamber cardioverter No date: JOINT REPLACEMENT; Left     Comment:  total knee replacement 2006: Left Knee Replacement No date: PROSTATE BIOPSY 1948: TONSILLECTOMY 06/20/2015: TOTAL KNEE REVISION; Left     Comment:  Procedure: TOTAL KNEE REVISION;  Surgeon: [name],               MD;  Location: [facility];  Service: Orthopedics;                Laterality: Left;  BMI    Body Mass Index:  28.73 kg/m      Reproductive/Obstetrics negative OB ROS                             Anesthesia Physical  Anesthesia Plan  ASA: III  Anesthesia Plan: General   Post-op Pain Management:    Induction: Intravenous  PONV Risk Score and Plan: Propofol infusion and TIVA  Airway Management Planned: Natural Airway and Nasal Cannula  Additional Equipment:   Intra-op Plan:   Post-operative Plan:   Informed Consent: I have reviewed the patients History and Physical, chart, labs and discussed the procedure including the risks, benefits and alternatives for the proposed anesthesia with the patient or authorized representative who has indicated his/her understanding and acceptance.   Dental Advisory Given  Plan Discussed with: Anesthesiologist, CRNA and Surgeon  Anesthesia Plan Comments: (Patient consented for risks of anesthesia including but not limited to:  - adverse reactions  to medications - risk of intubation if required - damage to teeth, lips or other oral mucosa - sore throat or hoarseness - Damage to heart, brain, lungs or loss of life  Patient voiced understanding.)        Anesthesia Quick Evaluation

## 2018-04-01 NOTE — Op Note (Signed)
[facility] Gastroenter[facility] Patient Name: [name] Procedure Date: 04/01/2018 1:19 PM MRN: [id number] Account #: [id number] Date of Birth: [birth year] Admit Type: Outpatient Age: 77 Room: [facility] ENDO ROOM 1 Gender: Male Note Status: Finalized Procedure:            Colonoscopy Indications:          Screening for colorectal malignant neoplasm Providers:            [name], MD Referring MD:         Marga Hoots NP (Referring MD) Medicines:            Propofol per Anesthesia Complications:        No immediate complications. Procedure:            Pre-Anesthesia Assessment:                       - After reviewing the risks and benefits, the patient                        was deemed in satisfactory condition to undergo the                        procedure.                       After obtaining informed consent, the colonoscope was                        passed under direct vision. Throughout the procedure,                        the patient's blood pressure, pulse, and oxygen                        saturations were monitored continuously. The                        Colonoscope was introduced through the anus and                        advanced to the the cecum, identified by appendiceal                        orifice and ileocecal valve. The colonoscopy was                        somewhat difficult due to significant looping. The                        patient tolerated the procedure well. The quality of                        the bowel preparation was adequate to identify polyps. Findings:      Internal hemorrhoids were found during endoscopy. The hemorrhoids were       medium-sized and Grade I (internal hemorrhoids that do not prolapse).      Two sessile polyps were found in the cecum. The polyps were diminutive       in size. These polyps were removed with a jumbo cold forceps. Resection       and retrieval were complete.  Small amount of  bleeding stopped on its own       and no further bleeding occured.      A single small-mouthed diverticulum was found in the sigmoid colon. Impression:           - Internal hemorrhoids.                       - Two diminutive polyps in the cecum, removed with a                        jumbo cold forceps. Resected and retrieved. Recommendation:       - Await pathology results. [name], MD 04/01/2018 2:03:24 PM This report has been signed electronically. Number of Addenda: 0 Note Initiated On: 04/01/2018 1:19 PM Scope Withdrawal Time: 0 hours 10 minutes 21 seconds  Total Procedure Duration: 0 hours 18 minutes 34 seconds       [facility]

## 2018-04-01 NOTE — Transfer of Care (Addendum)
Immediate Anesthesia Transfer of Care Note  Patient: [name].  Procedure(s) Performed: ESOPHAGOGASTRODUODENOSCOPY (EGD) WITH PROPOFOL (N/A ) COLONOSCOPY WITH PROPOFOL (N/A )  Patient Location: PACU  Anesthesia Type:General  Level of Consciousness: sedated  Airway & Oxygen Therapy: Patient Spontanous Breathing and Patient connected to nasal cannula oxygen  Post-op Assessment: Report given to RN and Post -op Vital signs reviewed and stable  Post vital signs: Reviewed and stable  Last Vitals:  Vitals Value Taken Time  BP    Temp    Pulse    Resp    SpO2      Last Pain:  Vitals:   04/01/18 1254  TempSrc: Tympanic  PainSc: 0-No pain      Patients Stated Pain Goal: 0 (90/21/11 5520)  Complications: No apparent anesthesia complications

## 2018-04-01 NOTE — H&P (Signed)
Primary Care Physician:  Derinda Late, MD Primary Gastroenterologist:  Dr. Vira Agar  Pre-Procedure History & Physical: HPI:  Ronnie Combes. is a 77 y.o. male is here for an endoscopy and colonoscopy.  For colon screening and GERD.   Past Medical History:  Diagnosis Date  . AICD (automatic cardioverter/defibrillator) present   . Allergic rhinitis   . Arthritis   . Asthma   . Biventricular defibrillator-Medtronic    Generator replacement 2012 with CRT upgrade  . BPH (benign prostatic hyperplasia)   . Cardiac arrest ([place])   . Dyspnea    PFT 12/19/10: FEV1 2.93(107%), FEV1% 72, TLC 8.02(138%), DLCO 110%, +BD  . ED (erectile dysfunction)   . GERD (gastroesophageal reflux disease)   . GERD (gastroesophageal reflux disease)   . High-grade heart block   . History of kidney stones   . Hyperlipidemia   . Hypertension   . 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 Myocardial infarction ([place])   . Sciatic nerve pain   . Severe sinus bradycardia   . Syncope       . TIA (transient ischemic attack) nov. 2011    Past Surgical History:  Procedure Laterality Date  . CATARACT EXTRACTION, BILATERAL    . CORONARY ANGIOPLASTY    . EP IMPLANTABLE DEVICE N/A 03/18/2016   Procedure:  BiV ICD Generator Changeout;  Surgeon: [name], MD;  Location: [facility];  Service: Cardiovascular;  Laterality: N/A;  . EYE SURGERY Bilateral    cataract extraction  . Implantation of Medtronic dual-chamber cardioverter    . JOINT REPLACEMENT Left    total knee replacement  . Left Knee Replacement  2006  . PROSTATE BIOPSY    . TONSILLECTOMY  1948  . TOTAL KNEE REVISION Left 06/20/2015   Procedure: TOTAL KNEE REVISION;  Surgeon: [name], MD;  Location: [facility];  Service: Orthopedics;  Laterality: Left;    Prior to Admission medications   Medication Sig Start Date End Date  Taking? Authorizing Provider  atorvastatin (LIPITOR) 80 MG tablet Take 1 tablet (80 mg total) by mouth daily. 11/05/15  Yes [name], MD  Azelastine HCl 137 MCG/SPRAY SOLN Place 2 sprays into both nostrils daily. 01/20/18  Yes [provider]  bisoprolol (ZEBETA) 5 MG tablet Take 1 tablet (5 mg total) by mouth daily. 11/20/14  Yes [name], MD  brimonidine [facility]) 0.2 % ophthalmic solution Apply 0.2 drops to eye 2 (two) times daily.   Yes [provider]  Calcium Citrate-Vitamin D (CITRACAL + D PO) Take by mouth daily.   Yes [provider]  dutasteride (AVODART) 0.5 MG capsule Take 0.5 mg by mouth 3 (three) times a week.     Yes [provider]  ELIQUIS 5 MG TABS tablet TAKE ONE TABLET BY MOUTH TWICE DAILY 03/04/18  Yes [name], MD  latanoprost (XALATAN) 0.005 % ophthalmic solution Place 1 drop into the right eye at bedtime.  01/04/15  Yes [provider]  losartan (COZAAR) 50 MG tablet Take 50 mg by mouth daily.   Yes [provider]  metFORMIN (GLUCOPHAGE-XR) 500 MG 24 hr tablet Take 1 tablet by mouth daily. 06/17/17 06/17/18 Yes [provider]  montelukast (SINGULAIR) 10 MG tablet Take 1 tablet by mouth daily. 02/10/18  Yes [provider]  Multiple Vitamin (MULTIVITAMIN) tablet Take 1 tablet by mouth daily.  Yes [provider]  omeprazole (PRILOSEC) 40 MG capsule Take 1 capsule by mouth daily. 03/05/17  Yes [provider]  ranitidine (ZANTAC) 75 MG tablet Take 75 mg by mouth at bedtime.    Yes [provider]  traZODone (DESYREL) 100 MG tablet Take 100 mg by mouth at bedtime.  01/07/15  Yes [provider]  zolpidem (AMBIEN) 5 MG tablet Take 10 mg by mouth at bedtime as needed for sleep.    Yes [provider]  albuterol (PROVENTIL HFA;VENTOLIN HFA) 108 (90 Base) MCG/ACT inhaler Inhale 2 puffs into the lungs every 6 (six) hours as needed for wheezing or  shortness of breath. Patient not taking: Reported on 04/01/2018 12/25/16   Earleen Newport, MD  amoxicillin (AMOXIL) 500 MG capsule Take 500 mg by mouth 2 (two) times daily. DENTIST APPT. 02/26/16   [provider]    Allergies as of 12/06/2017 - Review Complete 06/29/2017  Allergen Reaction Noted  . Beta adrenergic blockers Other (See Comments) 08/24/2014  . Oxycodone Nausea And Vomiting 08/20/2011  . Sulfonamide derivatives Nausea And Vomiting     Family History  Problem Relation Age of Onset  . Emphysema Father   . Lung cancer Father   . Cirrhosis Mother   . Hypertension Brother   . Hypertension Son   . Heart attack Neg Hx   . Stroke Neg Hx     Social History   Socioeconomic History  . Marital status: Married    Spouse name: Not on file  . Number of children: 4  . Years of education: Not on file  . Highest education level: Not on file  Occupational History  . Occupation: Retired Glass blower/designer  . Financial resource strain: Not on file  . Food insecurity:    Worry: Not on file    Inability: Not on file  . Transportation needs:    Medical: Not on file    Non-medical: Not on file  Tobacco Use  . Smoking status: Never Smoker  . Smokeless tobacco: Never Used  Substance and Sexual Activity  . Alcohol use: Yes    Comment: 2 glasses of alcohol per day  . Drug use: No  . Sexual activity: Not on file  Lifestyle  . Physical activity:    Days per week: Not on file    Minutes per session: Not on file  . Stress: Not on file  Relationships  . Social connections:    Talks on phone: Not on file    Gets together: Not on file    Attends religious service: Not on file    Active member of club or organization: Not on file    Attends meetings of clubs or organizations: Not on file    Relationship status: Not on file  . Intimate partner violence:    Fear of current or ex partner: Not on file    Emotionally abused: Not on file    Physically abused: Not on  file    Forced sexual activity: Not on file  Other Topics Concern  . Not on file  Social History Narrative   Regular exercise    Review of Systems: See HPI, otherwise negative ROS  Physical Exam: BP (!) 174/95   Pulse 69   Temp (!) 96.7 F (35.9 C) (Tympanic)   Resp 18   Ht 5\' 6"  (1.676 m)   Wt 80.7 kg (178 lb)   SpO2 100%   BMI 28.73 kg/m  General:  Alert,  pleasant and cooperative in NAD Head:  Normocephalic and atraumatic. Neck:  Supple; no masses or thyromegaly. Lungs:  Clear throughout to auscultation.    Heart:  Regular rate and rhythm. Abdomen:  Soft, nontender and nondistended. Normal bowel sounds, without guarding, and without rebound.   Neurologic:  Alert and  oriented x4;  grossly normal neurologically.  Impression/Plan: Ronnie Chimes. is here for an endoscopy and colonoscopy to be performed for colon screening and GERD  Risks, benefits, limitations, and alternatives regarding  endoscopy and colonoscopy have been reviewed with the patient.  Questions have been answered.  All parties agreeable.   Gaylyn Cheers, MD  04/01/2018, 1:19 PM

## 2018-04-01 NOTE — Anesthesia Post-op Follow-up Note (Signed)
Anesthesia QCDR form completed.        

## 2018-04-04 NOTE — Anesthesia Postprocedure Evaluation (Signed)
Anesthesia Post Note  Patient: [name].  Procedure(s) Performed: ESOPHAGOGASTRODUODENOSCOPY (EGD) WITH PROPOFOL (N/A ) COLONOSCOPY WITH PROPOFOL (N/A )  Patient location during evaluation: Endoscopy Anesthesia Type: General Level of consciousness: awake and alert Pain management: pain level controlled Vital Signs Assessment: post-procedure vital signs reviewed and stable Respiratory status: spontaneous breathing, nonlabored ventilation, respiratory function stable and patient connected to nasal cannula oxygen Cardiovascular status: blood pressure returned to baseline and stable Postop Assessment: no apparent nausea or vomiting Anesthetic complications: no     Last Vitals:  Vitals:   04/01/18 1411 04/01/18 1421  BP: 124/69 (!) 141/77  Pulse: 60 (!) 59  Resp: 12 (!) 23  Temp:    SpO2: 100% 100%    Last Pain:  Vitals:   04/01/18 1421  TempSrc:   PainSc: 0-No pain                 Precious Haws Piscitello

## 2018-04-05 LAB — SURGICAL PATHOLOGY

## 2018-04-19 LAB — CUP PACEART REMOTE DEVICE CHECK
Battery Voltage: 2.96 V
Brady Statistic AP VS Percent: 0.04 %
Brady Statistic AS VP Percent: 0.05 %
Brady Statistic RA Percent Paced: 99.49 %
HIGH POWER IMPEDANCE MEASURED VALUE: 48 Ohm
HighPow Impedance: 59 Ohm
Implantable Lead Implant Date: 20070615
Implantable Lead Implant Date: 20120813
Implantable Lead Location: 753858
Implantable Lead Location: 753859
Implantable Lead Model: 7121
Lead Channel Impedance Value: 247 Ohm
Lead Channel Impedance Value: 361 Ohm
Lead Channel Impedance Value: 361 Ohm
Lead Channel Impedance Value: 418 Ohm
Lead Channel Impedance Value: 874 Ohm
Lead Channel Pacing Threshold Amplitude: 0.875 V
Lead Channel Sensing Intrinsic Amplitude: 0.375 mV
Lead Channel Sensing Intrinsic Amplitude: 0.375 mV
Lead Channel Sensing Intrinsic Amplitude: 6.625 mV
Lead Channel Setting Pacing Amplitude: 2 V
Lead Channel Setting Sensing Sensitivity: 0.3 mV
MDC IDC LEAD IMPLANT DT: 20120813
MDC IDC LEAD LOCATION: 753860
MDC IDC MSMT BATTERY REMAINING LONGEVITY: 52 mo
MDC IDC MSMT LEADCHNL LV IMPEDANCE VALUE: 589 Ohm
MDC IDC MSMT LEADCHNL RV PACING THRESHOLD PULSEWIDTH: 0.4 ms
MDC IDC MSMT LEADCHNL RV SENSING INTR AMPL: 6.625 mV
MDC IDC PG IMPLANT DT: 20170329
MDC IDC SESS DTM: 20190409071603
MDC IDC SET LEADCHNL LV PACING AMPLITUDE: 1.75 V
MDC IDC SET LEADCHNL LV PACING PULSEWIDTH: 0.8 ms
MDC IDC SET LEADCHNL RV PACING AMPLITUDE: 2.5 V
MDC IDC SET LEADCHNL RV PACING PULSEWIDTH: 0.4 ms
MDC IDC STAT BRADY AP VP PERCENT: 99.91 %
MDC IDC STAT BRADY AS VS PERCENT: 0 %
MDC IDC STAT BRADY RV PERCENT PACED: 99.67 %

## 2018-06-28 ENCOUNTER — Ambulatory Visit (INDEPENDENT_AMBULATORY_CARE_PROVIDER_SITE_OTHER): Payer: Medicare Other | Admitting: *Deleted

## 2018-06-28 DIAGNOSIS — I255 Ischemic cardiomyopathy: Secondary | ICD-10-CM

## 2018-06-28 DIAGNOSIS — I442 Atrioventricular block, complete: Secondary | ICD-10-CM

## 2018-06-28 NOTE — Progress Notes (Signed)
Remote ICD transmission.   

## 2018-07-13 LAB — CUP PACEART REMOTE DEVICE CHECK
Battery Remaining Longevity: 49 mo
Battery Voltage: 2.96 V
Brady Statistic AP VP Percent: 99.79 %
Brady Statistic AS VP Percent: 0.17 %
Brady Statistic RA Percent Paced: 99.25 %
Brady Statistic RV Percent Paced: 99.57 %
HighPow Impedance: 50 Ohm
HighPow Impedance: 61 Ohm
Implantable Lead Implant Date: 20070615
Implantable Lead Location: 753858
Implantable Lead Location: 753859
Implantable Lead Model: 4396
Implantable Lead Model: 5076
Implantable Lead Model: 7121
Implantable Pulse Generator Implant Date: 20170329
Lead Channel Impedance Value: 361 Ohm
Lead Channel Impedance Value: 399 Ohm
Lead Channel Impedance Value: 475 Ohm
Lead Channel Pacing Threshold Pulse Width: 0.4 ms
Lead Channel Setting Pacing Amplitude: 1.75 V
Lead Channel Setting Sensing Sensitivity: 0.3 mV
MDC IDC LEAD IMPLANT DT: 20120813
MDC IDC LEAD IMPLANT DT: 20120813
MDC IDC LEAD LOCATION: 753860
MDC IDC MSMT LEADCHNL LV IMPEDANCE VALUE: 532 Ohm
MDC IDC MSMT LEADCHNL LV IMPEDANCE VALUE: 836 Ohm
MDC IDC MSMT LEADCHNL RA SENSING INTR AMPL: 0.625 mV
MDC IDC MSMT LEADCHNL RA SENSING INTR AMPL: 0.625 mV
MDC IDC MSMT LEADCHNL RV IMPEDANCE VALUE: 285 Ohm
MDC IDC MSMT LEADCHNL RV PACING THRESHOLD AMPLITUDE: 0.875 V
MDC IDC MSMT LEADCHNL RV SENSING INTR AMPL: 5.25 mV
MDC IDC MSMT LEADCHNL RV SENSING INTR AMPL: 5.25 mV
MDC IDC SESS DTM: 20190709062727
MDC IDC SET LEADCHNL LV PACING PULSEWIDTH: 0.8 ms
MDC IDC SET LEADCHNL RA PACING AMPLITUDE: 2 V
MDC IDC SET LEADCHNL RV PACING AMPLITUDE: 2.5 V
MDC IDC SET LEADCHNL RV PACING PULSEWIDTH: 0.4 ms
MDC IDC STAT BRADY AP VS PERCENT: 0.04 %
MDC IDC STAT BRADY AS VS PERCENT: 0 %

## 2018-07-28 ENCOUNTER — Encounter: Payer: Self-pay | Admitting: Urology

## 2018-07-28 ENCOUNTER — Ambulatory Visit
Admission: RE | Admit: 2018-07-28 | Discharge: 2018-07-28 | Disposition: A | Discharge status: Home / Self Care | Payer: Medicare Other | Source: Ambulatory Visit | Attending: Urology | Admitting: Urology

## 2018-07-28 ENCOUNTER — Ambulatory Visit (INDEPENDENT_AMBULATORY_CARE_PROVIDER_SITE_OTHER): Payer: Medicare Other | Admitting: Urology

## 2018-07-28 VITALS — BP 126/80 | HR 75 | Ht 66.0 in | Wt 185.7 lb

## 2018-07-28 DIAGNOSIS — N200 Calculus of kidney: Secondary | ICD-10-CM | POA: Diagnosis not present

## 2018-07-28 DIAGNOSIS — I255 Ischemic cardiomyopathy: Secondary | ICD-10-CM

## 2018-07-28 DIAGNOSIS — R3912 Poor urinary stream: Secondary | ICD-10-CM

## 2018-07-28 DIAGNOSIS — N400 Enlarged prostate without lower urinary tract symptoms: Secondary | ICD-10-CM | POA: Diagnosis not present

## 2018-07-28 DIAGNOSIS — R972 Elevated prostate specific antigen [PSA]: Secondary | ICD-10-CM

## 2018-07-28 LAB — URINALYSIS, COMPLETE
Bilirubin, UA: NEGATIVE
GLUCOSE, UA: NEGATIVE
Leukocytes, UA: NEGATIVE
NITRITE UA: NEGATIVE
RBC, UA: NEGATIVE
Specific Gravity, UA: >1.03 — ABNORMAL HIGH (ref 1.005–1.030)
Urobilinogen, Ur: 0.2 mg/dL (ref 0.2–1.0)
pH, UA: 5 (ref 5.0–7.5)

## 2018-07-28 LAB — MICROSCOPIC EXAMINATION

## 2018-07-28 LAB — BLADDER SCAN AMB NON-IMAGING: Scan Result: 37

## 2018-07-28 NOTE — Progress Notes (Signed)
07/28/2018 11:14 AM   Ronnie April Jr. 04-26-1941 [id number]  Referring provider: Derinda Late, MD [phone] [facility] [place], [place] [address]  Chief Complaint  Patient presents with  . Benign Prostatic Hypertrophy    HPI:  New patient for BPH.  Patient followed with Dr. Jacqlyn Larsen.  He has had BPH for many years and on Avodart monotherapy 2-3 times per week (10 per month).  A CT scan report from April 2018 described punctate renal stones and a 5 to 6 mm bladder stone. Follow-up KUB report May 2019 revealed bladder stones which were removed in 2018 with a cystolithopaxy with Dr. Jacqlyn Larsen. BPH was noted with a transverse measurement of 4.9 cm on the prostate. PVR is 37 ml. AUASS = 11, weak st. Most satisfied. Tried tamsulosin in the past.   He has a history of PSA elevation which was as high as 6.2 off dutasteride in 2017.  Most recent PSA of 3.01 June 2018 was slightly lower than 3.27 May 2017. His father had prostate cancer. He is on Eliquis for a fib. He recalls PSA usually "2-5". He's had three biopsies. Dr. Bernardo Heater did one and Dr. Jacqlyn Larsen did two with the last one around 2015.   He has a history of kidney stones and as above a CT report from April 2018 revealed punctate bilateral stones.  A 24-hour urine report from 2017 revealed low urine output and increase fluid intake and dietary citrate was recommended. He passed a stone about three years ago. No recent flank pain or stone passage.   Modifying factors: There are no other modifying factors  Associated signs and symptoms: There are no other associated signs and symptoms Aggravating and relieving factors: There are no other aggravating or relieving factors Severity: Moderate Duration: Persistent  PMH: Past Medical History:  Diagnosis Date  . AICD (automatic cardioverter/defibrillator) present   . Allergic rhinitis   . Arthritis   . Asthma   . Biventricular  defibrillator-Medtronic    Generator replacement 2012 with CRT upgrade  . BPH (benign prostatic hyperplasia)   . Cardiac arrest ([place])   . Dyspnea    PFT 12/19/10: FEV1 2.93(107%), FEV1% 72, TLC 8.02(138%), DLCO 110%, +BD  . ED (erectile dysfunction)   . GERD (gastroesophageal reflux disease)   . GERD (gastroesophageal reflux disease)   . High-grade heart block   . History of kidney stones   . Hyperlipidemia   . Hypertension   . 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 Myocardial infarction ([place])   . Sciatic nerve pain   . Severe sinus bradycardia   . Syncope       . TIA (transient ischemic attack) nov. 2011    Surgical History: Past Surgical History:  Procedure Laterality Date  . CATARACT EXTRACTION, BILATERAL    . COLONOSCOPY WITH PROPOFOL N/A 04/01/2018   Procedure: COLONOSCOPY WITH PROPOFOL;  Surgeon: [name], MD;  [facility];  Service: Endoscopy;  Laterality: N/A;  . CORONARY ANGIOPLASTY    . EP IMPLANTABLE DEVICE N/A 03/18/2016   Procedure:  BiV ICD Generator Changeout;  Surgeon: [name], MD;  Location: [facility];  Service: Cardiovascular;  Laterality: N/A;  . ESOPHAGOGASTRODUODENOSCOPY (EGD) WITH PROPOFOL N/A 04/01/2018   Procedure: ESOPHAGOGASTRODUODENOSCOPY (EGD) WITH PROPOFOL;  Surgeon: [name], MD;  Location: [facility];  Service: Endoscopy;  Laterality:  N/A;  . EYE SURGERY Bilateral    cataract extraction  . Implantation of Medtronic dual-chamber cardioverter    . JOINT REPLACEMENT Left    total knee replacement  . Left Knee Replacement  2006  . PROSTATE BIOPSY    . TONSILLECTOMY  1948  . TOTAL KNEE REVISION Left 06/20/2015   Procedure: TOTAL KNEE REVISION;  Surgeon: [name], MD;  Location: [facility];  Service: Orthopedics;  Laterality: Left;    Home Medications:  Allergies as of 07/28/2018      Reactions    Beta Adrenergic Blockers Other (See Comments)   Fatigue   Oxycodone Nausea And Vomiting   Sulfonamide Derivatives Nausea And Vomiting      Medication List        Accurate as of 07/28/18 11:14 AM. Always use your most recent med list.          albuterol 108 (90 Base) MCG/ACT inhaler Commonly known as:  PROVENTIL HFA;VENTOLIN HFA Inhale 2 puffs into the lungs every 6 (six) hours as needed for wheezing or shortness of breath.   amoxicillin 500 MG capsule Commonly known as:  AMOXIL Take 500 mg by mouth 2 (two) times daily. DENTIST APPT.   atorvastatin 80 MG tablet Commonly known as:  LIPITOR Take 1 tablet (80 mg total) by mouth daily.   Azelastine HCl 137 MCG/SPRAY Soln Place 2 sprays into both nostrils daily.   bisoprolol 5 MG tablet Commonly known as:  ZEBETA Take 1 tablet (5 mg total) by mouth daily.   brimonidine 0.2 % ophthalmic solution Commonly known as:  ALPHAGAN Apply 0.2 drops to eye 2 (two) times daily.   CITRACAL + D PO Take by mouth daily.   dutasteride 0.5 MG capsule Commonly known as:  AVODART Take 0.5 mg by mouth 3 (three) times a week.   ELIQUIS 5 MG Tabs tablet Generic drug:  apixaban TAKE ONE TABLET BY MOUTH TWICE DAILY   latanoprost 0.005 % ophthalmic solution Commonly known as:  XALATAN Place 1 drop into the right eye at bedtime.   losartan 50 MG tablet Commonly known as:  COZAAR Take 50 mg by mouth daily.   metFORMIN 500 MG 24 hr tablet Commonly known as:  GLUCOPHAGE-XR Take 1 tablet by mouth daily.   montelukast 10 MG tablet Commonly known as:  SINGULAIR Take 1 tablet by mouth daily.   multivitamin tablet Take 1 tablet by mouth daily.   omeprazole 40 MG capsule Commonly known as:  PRILOSEC Take 1 capsule by mouth daily.   ranitidine 75 MG tablet Commonly known as:  ZANTAC Take 75 mg by mouth at bedtime.   traZODone 100 MG tablet Commonly known as:  DESYREL Take 100 mg by mouth at bedtime.   zolpidem 5 MG  tablet Commonly known as:  AMBIEN Take 10 mg by mouth at bedtime as needed for sleep.       Allergies:  Allergies  Allergen Reactions  . Beta Adrenergic Blockers Other (See Comments)    Fatigue  . Oxycodone Nausea And Vomiting  . Sulfonamide Derivatives Nausea And Vomiting    Family History: Family History  Problem Relation Age of Onset  . Emphysema Father   . Lung cancer Father   . Cirrhosis Mother   . Hypertension Brother   . Hypertension Son   . Heart attack Neg Hx   . Stroke Neg Hx     Social History:  reports that he has never smoked. He has never used smokeless tobacco. He reports  that he drinks alcohol. He reports that he does not use drugs.  ROS:                                        Physical Exam: BP 126/80 (BP Location: Left Arm, Patient Position: Sitting, Cuff Size: Normal)   Pulse 75   Ht 5\' 6"  (1.676 m)   Wt 84.2 kg   BMI 29.97 kg/m   Constitutional:  Alert and oriented, No acute distress. HEENT: [place] AT, moist mucus membranes.  Trachea midline, no masses. Cardiovascular: No clubbing, cyanosis, or edema. Respiratory: Normal respiratory effort, no increased work of breathing. GI: Abdomen is soft, nontender, nondistended, no abdominal masses GU: No CVA tenderness DRE: 30 grams, smooth and no hard area or nodule  Lymph: No cervical or inguinal lymphadenopathy. Skin: No rashes, bruises or suspicious lesions. Neurologic: Grossly intact, no focal deficits, moving all 4 extremities. Psychiatric: Normal mood and affect.  Laboratory Data: Lab Results  Component Value Date   WBC 6.2 12/25/2016   HGB 12.8 (L) 12/25/2016   HCT 37.8 (L) 12/25/2016   MCV 90.7 12/25/2016   PLT 113 (L) 12/25/2016    Lab Results  Component Value Date   CREATININE 0.92 12/25/2016    No results found for: PSA  No results found for: TESTOSTERONE  No results found for: HGBA1C  Urinalysis    Component Value Date/Time   COLORURINE YELLOW (A)  12/25/2016 0652   APPEARANCEUR CLEAR (A) 12/25/2016 0652   APPEARANCEUR Clear 06/02/2014 1320   LABSPEC 1.026 12/25/2016 0652   LABSPEC 1.021 06/02/2014 1320   PHURINE 5.0 12/25/2016 [place] 12/25/2016 0652   GLUCOSEU Negative 06/02/2014 [place] 12/25/2016 [place] 12/25/2016 0652   BILIRUBINUR Negative 06/02/2014 [place] 12/25/2016 0652   PROTEINUR 100 (A) 12/25/2016 0652   NITRITE NEGATIVE 12/25/2016 0652   LEUKOCYTESUR NEGATIVE 12/25/2016 0652   LEUKOCYTESUR Trace 06/02/2014 1320    Lab Results  Component Value Date   BACTERIA NONE SEEN 12/25/2016    Pertinent records: Care everywhere office notes, labs, urine reports and imaging reports reviewed No results found for this or any previous visit. No results found for this or any previous visit. No results found for this or any previous visit. No results found for this or any previous visit. No results found for this or any previous visit. No results found for this or any previous visit. No results found for this or any previous visit. No results found for this or any previous visit.  Assessment & Plan:    1. Benign prostatic hyperplasia, lower urinary tract symptoms present, weak stream - Pt satisfied with dutasteride monotherapy. Discussed alpha blockers.  - Urinalysis, Complete - Bladder Scan (Post Void Residual) in office  2. Elevated PSA - I had a long discussion with the patient on the nature of elevated PSA - benign vs malignant causes. We discussed age specific levels and that PCa can be seen on a biopsy with very low PSA levels (<=2.5). We discussed the nature risks and benefits of continued surveillance, other lab tests, imaging as well as prostate biopsy. We discussed the management of prostate cancer might include active surveillance or treatment depending on biopsy findings. All questions answered. Given his stable PSA, age, prior biopsies we  will monitor. Also, discussed natyre r/b of MRI scan.  3. H/o Bladder stone - check KUB   4. Renal stones - check KUB.   No follow-ups on file.  Festus Aloe, MD  [facility] [address] [place], [place] [address] [phone]

## 2018-08-03 ENCOUNTER — Telehealth: Payer: Self-pay | Admitting: Family Medicine

## 2018-08-03 NOTE — Telephone Encounter (Signed)
Patient notified

## 2018-08-03 NOTE — Telephone Encounter (Signed)
-----   Message from Festus Aloe, MD sent at 07/29/2018 11:24 PM EDT ----- Notify patient his KUB looks good.  No large stones and if anything only a tiny stone seen in the left kidney.  ----- Message ----- From: Garnette Gunner, CMA Sent: 07/29/2018   8:07 AM EDT To: Festus Aloe, MD    ----- Message ----- From: Interface, Rad Results In Sent: 07/28/2018   4:57 PM EDT To: Rowe Robert Clinical

## 2018-08-05 ENCOUNTER — Telehealth: Payer: Self-pay | Admitting: Family Medicine

## 2018-08-05 NOTE — Telephone Encounter (Signed)
Patient notified

## 2018-08-05 NOTE — Telephone Encounter (Signed)
-----   Message from Festus Aloe, MD sent at 08/04/2018  4:47 PM EDT ----- Notify patient KUB shows only a tiny stone in the left kidney.  No significant stones or bladder stones.   ----- Message ----- From: Kyra Manges, CMA Sent: 08/03/2018   8:18 AM EDT To: Festus Aloe, MD    ----- Message ----- From: Festus Aloe, MD Sent: 07/29/2018  11:24 PM EDT To: Kyra Manges, CMA  Notify patient his KUB looks good.  No large stones and if anything only a tiny stone seen in the left kidney.  ----- Message ----- From: Garnette Gunner, CMA Sent: 07/29/2018   8:07 AM EDT To: Festus Aloe, MD    ----- Message ----- From: Interface, Rad Results In Sent: 07/28/2018   4:57 PM EDT To: Rowe Robert Clinical

## 2018-09-27 ENCOUNTER — Ambulatory Visit (INDEPENDENT_AMBULATORY_CARE_PROVIDER_SITE_OTHER): Payer: Medicare Other | Admitting: *Deleted

## 2018-09-27 DIAGNOSIS — I255 Ischemic cardiomyopathy: Secondary | ICD-10-CM | POA: Diagnosis not present

## 2018-09-28 NOTE — Progress Notes (Signed)
Remote ICD transmission.   

## 2018-10-06 ENCOUNTER — Encounter: Payer: Self-pay | Admitting: Cardiology

## 2018-10-20 LAB — CUP PACEART REMOTE DEVICE CHECK
Battery Remaining Longevity: 43 mo
Brady Statistic AP VS Percent: 0.02 %
Brady Statistic AS VP Percent: 4.02 %
Brady Statistic RA Percent Paced: 95.47 %
Date Time Interrogation Session: 20191008062603
HIGH POWER IMPEDANCE MEASURED VALUE: 52 Ohm
HighPow Impedance: 65 Ohm
Implantable Lead Implant Date: 20070615
Implantable Lead Implant Date: 20120813
Implantable Lead Location: 753858
Implantable Lead Location: 753860
Implantable Lead Model: 4396
Implantable Pulse Generator Implant Date: 20170329
Lead Channel Impedance Value: 247 Ohm
Lead Channel Impedance Value: 361 Ohm
Lead Channel Impedance Value: 418 Ohm
Lead Channel Impedance Value: 475 Ohm
Lead Channel Pacing Threshold Amplitude: 1 V
Lead Channel Pacing Threshold Pulse Width: 0.4 ms
Lead Channel Sensing Intrinsic Amplitude: 5.25 mV
Lead Channel Sensing Intrinsic Amplitude: 5.25 mV
Lead Channel Setting Pacing Amplitude: 1.75 V
Lead Channel Setting Pacing Amplitude: 2 V
Lead Channel Setting Pacing Amplitude: 2.5 V
Lead Channel Setting Pacing Pulse Width: 0.4 ms
Lead Channel Setting Pacing Pulse Width: 0.8 ms
Lead Channel Setting Sensing Sensitivity: 0.3 mV
MDC IDC LEAD IMPLANT DT: 20120813
MDC IDC LEAD LOCATION: 753859
MDC IDC MSMT BATTERY VOLTAGE: 2.96 V
MDC IDC MSMT LEADCHNL LV IMPEDANCE VALUE: 1007 Ohm
MDC IDC MSMT LEADCHNL LV IMPEDANCE VALUE: 589 Ohm
MDC IDC MSMT LEADCHNL RA SENSING INTR AMPL: 1.875 mV
MDC IDC MSMT LEADCHNL RA SENSING INTR AMPL: 1.875 mV
MDC IDC STAT BRADY AP VP PERCENT: 95.96 %
MDC IDC STAT BRADY AS VS PERCENT: 0 %
MDC IDC STAT BRADY RV PERCENT PACED: 99.72 %

## 2018-12-06 ENCOUNTER — Other Ambulatory Visit: Payer: Self-pay | Admitting: Internal Medicine

## 2018-12-06 NOTE — Telephone Encounter (Signed)
Pt last saw Dr [name] 02/21/18, last labs 06/14/18 at [place] 1.0, age 77, weight 84.2kg, based on specified criteria pt is on appropriate dosage of Eliquis 5mg  BID.  Will refill rx.

## 2018-12-27 ENCOUNTER — Ambulatory Visit (INDEPENDENT_AMBULATORY_CARE_PROVIDER_SITE_OTHER): Payer: Medicare Other

## 2018-12-27 DIAGNOSIS — I255 Ischemic cardiomyopathy: Secondary | ICD-10-CM

## 2018-12-27 DIAGNOSIS — I5022 Chronic systolic (congestive) heart failure: Secondary | ICD-10-CM

## 2018-12-28 NOTE — Progress Notes (Signed)
Remote ICD transmission.   

## 2018-12-29 LAB — CUP PACEART REMOTE DEVICE CHECK
Battery Remaining Longevity: 39 mo
Brady Statistic AP VP Percent: 95.33 %
Brady Statistic AP VS Percent: 0.01 %
Brady Statistic AS VS Percent: 0 %
Brady Statistic RV Percent Paced: 99.82 %
HIGH POWER IMPEDANCE MEASURED VALUE: 60 Ohm
HighPow Impedance: 51 Ohm
Implantable Lead Implant Date: 20070615
Implantable Lead Implant Date: 20120813
Implantable Lead Implant Date: 20120813
Implantable Lead Location: 753858
Implantable Lead Location: 753859
Implantable Lead Location: 753860
Implantable Lead Model: 4396
Implantable Lead Model: 5076
Implantable Lead Model: 7121
Lead Channel Impedance Value: 399 Ohm
Lead Channel Impedance Value: 456 Ohm
Lead Channel Impedance Value: 589 Ohm
Lead Channel Impedance Value: 893 Ohm
Lead Channel Pacing Threshold Pulse Width: 0.4 ms
Lead Channel Sensing Intrinsic Amplitude: 0.375 mV
Lead Channel Sensing Intrinsic Amplitude: 0.375 mV
Lead Channel Sensing Intrinsic Amplitude: 5.25 mV
Lead Channel Setting Pacing Amplitude: 1.75 V
Lead Channel Setting Pacing Amplitude: 2.5 V
Lead Channel Setting Pacing Pulse Width: 0.4 ms
Lead Channel Setting Sensing Sensitivity: 0.3 mV
MDC IDC MSMT BATTERY VOLTAGE: 2.95 V
MDC IDC MSMT LEADCHNL RA IMPEDANCE VALUE: 399 Ohm
MDC IDC MSMT LEADCHNL RV IMPEDANCE VALUE: 247 Ohm
MDC IDC MSMT LEADCHNL RV PACING THRESHOLD AMPLITUDE: 1 V
MDC IDC MSMT LEADCHNL RV SENSING INTR AMPL: 5.25 mV
MDC IDC PG IMPLANT DT: 20170329
MDC IDC SESS DTM: 20200107081804
MDC IDC SET LEADCHNL LV PACING PULSEWIDTH: 0.8 ms
MDC IDC SET LEADCHNL RA PACING AMPLITUDE: 2 V
MDC IDC STAT BRADY AS VP PERCENT: 4.66 %
MDC IDC STAT BRADY RA PERCENT PACED: 95.04 %

## 2019-01-30 ENCOUNTER — Ambulatory Visit: Payer: Medicare Other | Admitting: Urology

## 2019-02-15 ENCOUNTER — Ambulatory Visit
Admission: RE | Admit: 2019-02-15 | Discharge: 2019-02-15 | Disposition: A | Discharge status: Home / Self Care | Payer: Medicare Other | Source: Ambulatory Visit | Attending: Urology | Admitting: Urology

## 2019-02-15 ENCOUNTER — Encounter: Payer: Self-pay | Admitting: Urology

## 2019-02-15 ENCOUNTER — Other Ambulatory Visit: Payer: Self-pay

## 2019-02-15 ENCOUNTER — Ambulatory Visit (INDEPENDENT_AMBULATORY_CARE_PROVIDER_SITE_OTHER): Payer: Medicare Other | Admitting: Urology

## 2019-02-15 VITALS — BP 122/73 | HR 76 | Ht 65.0 in | Wt 193.8 lb

## 2019-02-15 DIAGNOSIS — N200 Calculus of kidney: Secondary | ICD-10-CM

## 2019-02-15 DIAGNOSIS — R972 Elevated prostate specific antigen [PSA]: Secondary | ICD-10-CM

## 2019-02-15 DIAGNOSIS — I255 Ischemic cardiomyopathy: Secondary | ICD-10-CM

## 2019-02-15 DIAGNOSIS — N400 Enlarged prostate without lower urinary tract symptoms: Secondary | ICD-10-CM | POA: Diagnosis not present

## 2019-02-15 NOTE — Patient Instructions (Signed)
Benign Prostatic Hyperplasia ° °Benign prostatic hyperplasia (BPH) is an enlarged prostate gland that is caused by the normal aging process and not by cancer. The prostate is a walnut-sized gland that is involved in the production of semen. It is located in front of the rectum and below the bladder. The bladder stores urine and the urethra is the tube that carries the urine out of the body. The prostate may get bigger as a man gets older. °An enlarged prostate can press on the urethra. This can make it harder to pass urine. The build-up of urine in the bladder can cause infection. Back pressure and infection may progress to bladder damage and kidney (renal) failure. °What are the causes? °This condition is part of a normal aging process. However, not all men develop problems from this condition. If the prostate enlarges away from the urethra, urine flow will not be blocked. If it enlarges toward the urethra and compresses it, there will be problems passing urine. °What increases the risk? °This condition is more likely to develop in men over the age of 50 years. °What are the signs or symptoms? °Symptoms of this condition include: °· Getting up often during the night to urinate. °· Needing to urinate frequently during the day. °· Difficulty starting urine flow. °· Decrease in size and strength of your urine stream. °· Leaking (dribbling) after urinating. °· Inability to pass urine. This needs immediate treatment. °· Inability to completely empty your bladder. °· Pain when you pass urine. This is more common if there is also an infection. °· Urinary tract infection (UTI). °How is this diagnosed? °This condition is diagnosed based on your medical history, a physical exam, and your symptoms. Tests will also be done, such as: °· A post-void bladder scan. This measures any amount of urine that may remain in your bladder after you finish urinating. °· A digital rectal exam. In a rectal exam, your health care provider  checks your prostate by putting a lubricated, gloved finger into your rectum to feel the back of your prostate gland. This exam detects the size of your gland and any abnormal lumps or growths. °· An exam of your urine (urinalysis). °· A prostate specific antigen (PSA) screening. This is a blood test used to screen for prostate cancer. °· An ultrasound. This test uses sound waves to electronically produce a picture of your prostate gland. °Your health care provider may refer you to a specialist in kidney and prostate diseases (urologist). °How is this treated? °Once symptoms begin, your health care provider will monitor your condition (active surveillance or watchful waiting). Treatment for this condition will depend on the severity of your condition. Treatment may include: °· Observation and yearly exams. This may be the only treatment needed if your condition and symptoms are mild. °· Medicines to relieve your symptoms, including: °? Medicines to shrink the prostate. °? Medicines to relax the muscle of the prostate. °· Surgery in severe cases. Surgery may include: °? Prostatectomy. In this procedure, the prostate tissue is removed completely through an open incision or with a laparascope or robotics. °? Transurethral resection of the prostate (TURP). In this procedure, a tool is inserted through the opening at the tip of the penis (urethra). It is used to cut away tissue of the inner core of the prostate. The pieces are removed through the same opening of the penis. This removes the blockage. °? Transurethral incision (TUIP). In this procedure, small cuts are made in the prostate. This lessens   the prostate's pressure on the urethra. °? Transurethral microwave thermotherapy (TUMT). This procedure uses microwaves to create heat. The heat destroys and removes a small amount of prostate tissue. °? Transurethral needle ablation (TUNA). This procedure uses radio frequencies to destroy and remove a small amount of  prostate tissue. °? Interstitial laser coagulation (ILC). This procedure uses a laser to destroy and remove a small amount of prostate tissue. °? Transurethral electrovaporization (TUVP). This procedure uses electrodes to destroy and remove a small amount of prostate tissue. °? Prostatic urethral lift. This procedure inserts an implant to push the lobes of the prostate away from the urethra. °Follow these instructions at home: °· Take over-the-counter and prescription medicines only as told by your health care provider. °· Monitor your symptoms for any changes. Contact your health care provider with any changes. °· Avoid drinking large amounts of liquid before going to bed or out in public. °· Avoid or reduce how much caffeine or alcohol you drink. °· Give yourself time when you urinate. °· Keep all follow-up visits as told by your health care provider. This is important. °Contact a health care provider if: °· You have unexplained back pain. °· Your symptoms do not get better with treatment. °· You develop side effects from the medicine you are taking. °· Your urine becomes very dark or has a bad smell. °· Your lower abdomen becomes distended and you have trouble passing your urine. °Get help right away if: °· You have a fever or chills. °· You suddenly cannot urinate. °· You feel lightheaded, or very dizzy, or you faint. °· There are large amounts of blood or clots in the urine. °· Your urinary problems become hard to manage. °· You develop moderate to severe low back or flank pain. The flank is the side of your body between the ribs and the hip. °These symptoms may represent a serious problem that is an emergency. Do not wait to see if the symptoms will go away. Get medical help right away. Call your local emergency services (911 in the [place] Do not drive yourself to the hospital. °Summary °· Benign prostatic hyperplasia (BPH) is an enlarged prostate that is caused by the normal aging process and not by  cancer. °· An enlarged prostate can press on the urethra. This can make it hard to pass urine. °· This condition is part of a normal aging process and is more likely to develop in men over the age of 50 years. °· Get help right away if you suddenly cannot urinate. °This information is not intended to replace advice given to you by your health care provider. Make sure you discuss any questions you have with your health care provider. °Document Released: 12/07/2005 Document Revised: 01/11/2017 Document Reviewed: 01/11/2017 °Elsevier Interactive Patient Education © 2019 [employer] Inc[employer] ° ° °Prostate Cancer Screening ° °The prostate is a walnut-sized gland that is located below the bladder and in front of the rectum in males. The function of the prostate (prostate gland) is to add fluid to semen during ejaculation. Prostate cancer is the second most common type of cancer in men. °A screening test for cancer is a test that is done before cancer symptoms start. Screening can help to identify cancer at an early stage, when the cancer can be treated more easily. The recommended prostate cancer screening test is a blood test called the prostate-specific antigen (PSA) test. PSA is a protein that is made in the prostate. As you age, your prostate naturally produces   more PSA. Abnormally high PSA levels may be caused by: °· Prostate cancer. °· An enlarged prostate that is not caused by cancer (benign prostatic hyperplasia, BPH). This condition is very common in older men. °· A prostate gland infection (prostatitis). °· Medicines to assist with hair growth, such as finasteride. °Depending on the PSA results, you may need more tests, such as: °· A physical exam to check the size of your prostate gland. °· Blood and imaging tests. °· A procedure to remove tissue samples from your prostate gland for testing (biopsy). °Who should have screening? °Screening recommendations vary based on age. °· If you are younger than age 40, screening  is not recommended. °· If you are age 40-54 and you have no risk factors, screening is not recommended. °· If you are younger than age 55, ask your health care provider if you need screening if you have one of these risk factors: °? Being of African-American descent. °? Having a family history of prostate cancer. °· If you are age 55-69, talk with your health care provider about your need for screening and how often screening should be done. °· If you are older than age 70, screening is not recommended. This is because the risks that screening can cause are greater than the benefits that it may provide (risks outweigh the benefits). °If you are at high risk for prostate cancer, your health care provider may recommend that you have screenings more often or start screening at a younger age. You may be at high risk if you: °· Are older than age 55. °· Are African-American. °· Have a father, brother, or uncle who has been diagnosed with prostate cancer. The risk may be higher if your family member's cancer occurred at an early age. °What are the benefits of screening? °There is a small chance that screening may lower your risk of dying from prostate cancer. The chance is small because prostate cancer is typically a slow-growing cancer, and most men with prostate cancer die from a different cause. °What are the risks of screening? °The main risk of prostate cancer screening is diagnosing and treating prostate cancer that would never have caused any symptoms or problems (overdiagnosis and overtreatment). PSA screening cannot tell you if your PSA is high due to cancer or a different cause. A prostate biopsy is the only procedure to diagnose prostate cancer. Even the results of a biopsy may not tell you if your cancer needs to be treated. Slow-growing prostate cancer may not need any treatment other than monitoring, so diagnosing and treating it may cause unnecessary stress or other side effects. °A prostate biopsy may also  cause: °· Infection or fever. °· A false negative. This is a result that shows that you do not have prostate cancer when you actually do have prostate cancer. °Questions to ask your health care provider °· When should I start prostate cancer screening? °· What is my risk for prostate cancer? °· How often do I need screening? °· What type of screening tests do I need? °· How do I get my test results? °· What do my results mean? °· Do I need treatment? °Contact a health care provider if: °· You have difficulty urinating. °· You have pain when you urinate or ejaculate. °· You have blood in your urine or semen. °· You have pain in your back or in the area of your prostate. °· You have trouble getting or maintaining an erection (erectile dysfunction, ED). °Summary °· Prostate cancer   is a common type of cancer in men. The prostate (prostate gland) is located below the bladder and in front of the rectum. This gland adds fluid to semen during ejaculation. °· Prostate cancer screening may identify cancer at an early stage, when the cancer can be treated more easily. °· The prostate-specific antigen (PSA) test is the recommended screening test for prostate cancer. °· Discuss the risks and benefits of prostate cancer screening with your health care provider. If you are age 70 or older, screening is likely to lead to more risks than benefits (risks outweigh the benefits). °This information is not intended to replace advice given to you by your health care provider. Make sure you discuss any questions you have with your health care provider. °Document Released: 09/17/2017 Document Revised: 09/17/2017 Document Reviewed: 09/17/2017 °Elsevier Interactive Patient Education © 2019 Elsevier Inc. ° ° °

## 2019-02-15 NOTE — Progress Notes (Signed)
02/15/2019 3:10 PM   Ronnie April Jr. 1941-10-19 [id number]  Referring provider: Derinda Late, MD [phone] [address] [place], [place] [address]  Chief Complaint  Patient presents with  . Nephrolithiasis    HPI:  F/u - 78 yo with following issues:  He has had BPH for many years and on Avodart monotherapy 2-3 times per week (10 per month).  A CT scan report from April 2018 described punctate renal stones and a 5 to 6 mm bladder stone. Follow-up KUB report May 2019 revealed bladder stones which were removed in 2018 with a cystolithopaxy with Dr. Jacqlyn Larsen. BPH was noted with a transverse measurement of 4.9 cm on the prostate. PVR was 37 ml. AUASS = 11, weak st. Most satisfied. Tried tamsulosin in the past.   He has a history of PSA elevation which was as high as 6.2 off dutasteride in 2017.  Most recent PSA of 3.01 June 2018 was slightly lower than 3.27 May 2017. His father had prostate cancer. He is on Eliquis for a fib. He recalls PSA usually "2-5". He's had three biopsies. Dr. Bernardo Heater did one and Dr. Jacqlyn Larsen did two with the last one around 2015. Normal DRE Aug 2019.   He has a history of kidney stones and as above a CT report from April 2018 revealed punctate bilateral stones.  A 24-hour urine report from 2017 revealed low urine output and increase fluid intake and dietary citrate was recommended. He passed a stone about three years ago. No recent flank pain or stone passage.  He was seen for the above. KUB today without obvious stone on my unofficial review. Read is pending. He's had no flank pain or stone passage. No gross hematuria. He has some PV dribble.   PMH: Past Medical History:  Diagnosis Date  . AICD (automatic cardioverter/defibrillator) present   . Allergic rhinitis   . Arthritis   . Asthma   . Biventricular defibrillator-Medtronic    Generator replacement 2012 with CRT upgrade  . BPH (benign prostatic hyperplasia)     . Cardiac arrest ([place])   . Dyspnea    PFT 12/19/10: FEV1 2.93(107%), FEV1% 72, TLC 8.02(138%), DLCO 110%, +BD  . ED (erectile dysfunction)   . GERD (gastroesophageal reflux disease)   . GERD (gastroesophageal reflux disease)   . High-grade heart block   . History of kidney stones   . Hyperlipidemia   . Hypertension   . 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 Myocardial infarction ([place])   . Sciatic nerve pain   . Severe sinus bradycardia   . Syncope       . TIA (transient ischemic attack) nov. 2011    Surgical History: Past Surgical History:  Procedure Laterality Date  . CATARACT EXTRACTION, BILATERAL    . COLONOSCOPY WITH PROPOFOL N/A 04/01/2018   Procedure: COLONOSCOPY WITH PROPOFOL;  Surgeon: [name], MD;  Location: [facility] ENDOSCOPY;  Service: Endoscopy;  Laterality: N/A;  . CORONARY ANGIOPLASTY    . EP IMPLANTABLE DEVICE N/A 03/18/2016   Procedure:  BiV ICD Generator Changeout;  Surgeon: [name], MD;  Location: [facility];  Service: Cardiovascular;  Laterality: N/A;  . ESOPHAGOGASTRODUODENOSCOPY (EGD) WITH PROPOFOL N/A 04/01/2018   Procedure: ESOPHAGOGASTRODUODENOSCOPY (EGD) WITH PROPOFOL;  Surgeon: [name], MD;  Location: [facility] ENDOSCOPY;  Service: Endoscopy;  Laterality: N/A;  .  EYE SURGERY Bilateral    cataract extraction  . Implantation of Medtronic dual-chamber cardioverter    . JOINT REPLACEMENT Left    total knee replacement  . Left Knee Replacement  2006  . PROSTATE BIOPSY    . TONSILLECTOMY  1948  . TOTAL KNEE REVISION Left 06/20/2015   Procedure: TOTAL KNEE REVISION;  Surgeon: [name], MD;  Location: [facility] ORS;  Service: Orthopedics;  Laterality: Left;    Home Medications:  Allergies as of 02/15/2019      Reactions   Beta Adrenergic Blockers Other (See Comments)   Fatigue   Oxycodone Nausea And Vomiting   Sulfonamide  Derivatives Nausea And Vomiting      Medication List       Accurate as of February 15, 2019  3:10 PM. Always use your most recent med list.        atorvastatin 80 MG tablet Commonly known as:  LIPITOR Take 1 tablet (80 mg total) by mouth daily.   bisoprolol 5 MG tablet Commonly known as:  ZEBETA Take 1 tablet (5 mg total) by mouth daily.   CITRACAL + D PO Take by mouth daily.   dorzolamide 2 % ophthalmic solution Commonly known as:  TRUSOPT 1 drop 3 (three) times daily.   dutasteride 0.5 MG capsule Commonly known as:  AVODART Take 0.5 mg by mouth 3 (three) times a week.   ELIQUIS 5 MG Tabs tablet Generic drug:  apixaban TAKE 1 TABLET BY MOUTH TWICE DAILY   latanoprost 0.005 % ophthalmic solution Commonly known as:  XALATAN Place 1 drop into the right eye at bedtime.   losartan 50 MG tablet Commonly known as:  COZAAR Take 50 mg by mouth daily.   metFORMIN 500 MG 24 hr tablet Commonly known as:  GLUCOPHAGE-XR Take 1 tablet by mouth daily.   montelukast 10 MG tablet Commonly known as:  SINGULAIR Take 1 tablet by mouth daily.   multivitamin tablet Take 1 tablet by mouth daily.   omeprazole 40 MG capsule Commonly known as:  PRILOSEC Take 1 capsule by mouth daily.   ranitidine 75 MG tablet Commonly known as:  ZANTAC Take 75 mg by mouth at bedtime.   traZODone 100 MG tablet Commonly known as:  DESYREL Take 100 mg by mouth at bedtime.   zolpidem 5 MG tablet Commonly known as:  AMBIEN Take 10 mg by mouth at bedtime as needed for sleep.       Allergies:  Allergies  Allergen Reactions  . Beta Adrenergic Blockers Other (See Comments)    Fatigue  . Oxycodone Nausea And Vomiting  . Sulfonamide Derivatives Nausea And Vomiting    Family History: Family History  Problem Relation Age of Onset  . Emphysema Father   . Lung cancer Father   . Cirrhosis Mother   . Hypertension Brother   . Hypertension Son   . Heart attack Neg Hx   . Stroke Neg Hx      Social History:  reports that he has never smoked. He has never used smokeless tobacco. He reports current alcohol use. He reports that he does not use drugs.  ROS: UROLOGY Frequent Urination?: Yes Hard to postpone urination?: Yes Burning/pain with urination?: No Get up at night to urinate?: Yes Leakage of urine?: Yes Urine stream starts and stops?: No Trouble starting stream?: No Do you have to strain to urinate?: No Blood in urine?: No Urinary tract infection?: No Sexually transmitted disease?: No Injury to kidneys or bladder?: No Painful intercourse?:  No Weak stream?: Yes Erection problems?: Yes Penile pain?: No  Gastrointestinal Nausea?: No Vomiting?: No Indigestion/heartburn?: No Diarrhea?: No Constipation?: No  Constitutional Fever: No Night sweats?: No Weight loss?: No Fatigue?: No  Skin Skin rash/lesions?: No Itching?: No  Eyes Blurred vision?: No Double vision?: No  Ears/Nose/Throat Sore throat?: No Sinus problems?: Yes  Hematologic/Lymphatic Swollen glands?: No Easy bruising?: No  Cardiovascular Leg swelling?: No Chest pain?: No  Respiratory Cough?: No Shortness of breath?: No  Endocrine Excessive thirst?: No  Musculoskeletal Back pain?: Yes Joint pain?: Yes  Neurological Headaches?: No Dizziness?: No  Psychologic Depression?: No Anxiety?: No  Physical Exam: BP 122/73 (BP Location: Left Arm, Patient Position: Sitting, Cuff Size: Normal)   Pulse 76   Ht 5\' 5"  (1.651 m)   Wt 87.9 kg   BMI 32.25 kg/m   Constitutional:  Alert and oriented, No acute distress. HEENT: Gap AT, moist mucus membranes.  Trachea midline, no masses. Cardiovascular: No clubbing, cyanosis, or edema. Respiratory: Normal respiratory effort, no increased work of breathing. GI: Abdomen is soft, nontender, nondistended, no abdominal masses GU: No CVA tenderness Lymph: No cervical lymphadenopathy. Skin: No rashes, bruises or suspicious  lesions. Neurologic: Grossly intact, no focal deficits, moving all 4 extremities. Psychiatric: Normal mood and affect.  Laboratory Data: Lab Results  Component Value Date   WBC 6.2 12/25/2016   HGB 12.8 (L) 12/25/2016   HCT 37.8 (L) 12/25/2016   MCV 90.7 12/25/2016   PLT 113 (L) 12/25/2016    Lab Results  Component Value Date   CREATININE 0.92 12/25/2016    No results found for: PSA  No results found for: TESTOSTERONE  No results found for: HGBA1C  Urinalysis    Component Value Date/Time   COLORURINE YELLOW (A) 12/25/2016 0652   APPEARANCEUR Clear 07/28/2018 1107   LABSPEC 1.026 12/25/2016 0652   LABSPEC 1.021 06/02/2014 1320   PHURINE 5.0 12/25/2016 0652   GLUCOSEU Negative 07/28/2018 1107   GLUCOSEU Negative 06/02/2014 [place] 12/25/2016 0652   BILIRUBINUR Negative 07/28/2018 1107   BILIRUBINUR Negative 06/02/2014 1320   KETONESUR NEGATIVE 12/25/2016 0652   PROTEINUR 1+ (A) 07/28/2018 1107   PROTEINUR 100 (A) 12/25/2016 0652   NITRITE Negative 07/28/2018 1107   NITRITE NEGATIVE 12/25/2016 0652   LEUKOCYTESUR Negative 07/28/2018 1107   LEUKOCYTESUR Trace 06/02/2014 1320    Lab Results  Component Value Date   LABMICR See below: 07/28/2018   WBCUA 0-5 07/28/2018   RBCUA 0-2 07/28/2018   LABEPIT 0-10 07/28/2018   MUCUS Present (A) 07/28/2018   BACTERIA Few (A) 07/28/2018    Pertinent Imaging: CT in 2017 and KUB in 2019   Results for orders placed during the hospital encounter of 07/28/18  Abdomen 1 view (KUB)   Narrative CLINICAL DATA:  Nephrolithiasis  EXAM: ABDOMEN - 1 VIEW  COMPARISON:  None  FINDINGS: Scattered large and small bowel gas is noted. No obstructive changes are seen. A tiny density is noted over the left renal outline. This may be related to ingested material within the transverse colon although the possibility of a small renal stone could not be totally excluded. Multiple phleboliths are noted within the  pelvis. Degenerative changes of the lumbar spine are seen.  IMPRESSION: Tiny density overlying the left renal shadow as described. This may represent a small stone. No other focal abnormality is noted.   Electronically Signed   By: Inez Catalina M.D.   On: 07/28/2018 16:54    No results found  for this or any previous visit. No results found for this or any previous visit. No results found for this or any previous visit. No results found for this or any previous visit. No results found for this or any previous visit. No results found for this or any previous visit. No results found for this or any previous visit.  Assessment & Plan:    BPH with luts - stable   PSA - check in 6 months   Kidney stones - resolved    No follow-ups on file.  Festus Aloe, MD  [facility] [address] [place], [place] [address] [phone]

## 2019-03-28 ENCOUNTER — Other Ambulatory Visit: Payer: Self-pay

## 2019-03-28 ENCOUNTER — Ambulatory Visit (INDEPENDENT_AMBULATORY_CARE_PROVIDER_SITE_OTHER): Payer: Medicare Other | Admitting: *Deleted

## 2019-03-28 ENCOUNTER — Telehealth: Payer: Self-pay

## 2019-03-28 DIAGNOSIS — I255 Ischemic cardiomyopathy: Secondary | ICD-10-CM | POA: Diagnosis not present

## 2019-03-28 DIAGNOSIS — I5022 Chronic systolic (congestive) heart failure: Secondary | ICD-10-CM

## 2019-03-28 LAB — CUP PACEART REMOTE DEVICE CHECK
Battery Remaining Longevity: 34 mo
Battery Voltage: 2.95 V
Brady Statistic AP VP Percent: 98.31 %
Brady Statistic AP VS Percent: 0.01 %
Brady Statistic AS VP Percent: 1.67 %
Brady Statistic AS VS Percent: 0 %
Brady Statistic RA Percent Paced: 97.76 %
Brady Statistic RV Percent Paced: 99.76 %
Date Time Interrogation Session: 20200407175513
HighPow Impedance: 50 Ohm
HighPow Impedance: 62 Ohm
Implantable Lead Implant Date: 20070615
Implantable Lead Implant Date: 20120813
Implantable Lead Implant Date: 20120813
Implantable Lead Location: 753858
Implantable Lead Location: 753859
Implantable Lead Location: 753860
Implantable Lead Model: 4396
Implantable Lead Model: 5076
Implantable Lead Model: 7121
Implantable Pulse Generator Implant Date: 20170329
Lead Channel Impedance Value: 285 Ohm
Lead Channel Impedance Value: 361 Ohm
Lead Channel Impedance Value: 361 Ohm
Lead Channel Impedance Value: 475 Ohm
Lead Channel Impedance Value: 589 Ohm
Lead Channel Impedance Value: 950 Ohm
Lead Channel Pacing Threshold Amplitude: 0.875 V
Lead Channel Pacing Threshold Pulse Width: 0.4 ms
Lead Channel Sensing Intrinsic Amplitude: 0.375 mV
Lead Channel Sensing Intrinsic Amplitude: 0.375 mV
Lead Channel Sensing Intrinsic Amplitude: 7 mV
Lead Channel Sensing Intrinsic Amplitude: 7 mV
Lead Channel Setting Pacing Amplitude: 1.75 V
Lead Channel Setting Pacing Amplitude: 2 V
Lead Channel Setting Pacing Amplitude: 2.5 V
Lead Channel Setting Pacing Pulse Width: 0.4 ms
Lead Channel Setting Pacing Pulse Width: 0.8 ms
Lead Channel Setting Sensing Sensitivity: 0.3 mV

## 2019-03-28 NOTE — Telephone Encounter (Signed)
Spoke with patient to remind of missed remote transmission 

## 2019-04-06 ENCOUNTER — Encounter: Payer: Self-pay | Admitting: Cardiology

## 2019-04-06 NOTE — Progress Notes (Signed)
Remote ICD transmission.   

## 2019-05-02 ENCOUNTER — Telehealth: Payer: Medicare Other | Admitting: Internal Medicine

## 2019-06-07 ENCOUNTER — Other Ambulatory Visit: Payer: Self-pay | Admitting: Internal Medicine

## 2019-06-07 NOTE — Telephone Encounter (Signed)
Eliquis 5mg  refill request received; pt is 78 yrs old, wt-87.9kg, Crea-0.90 on 12/22/2018 via [place] at [facility], last seen by Dr. [name] on 02/21/18 and has a virtual appt on 06/08/2019 at 1130am; will send in refill to requested pharmacy.

## 2019-06-08 ENCOUNTER — Telehealth (INDEPENDENT_AMBULATORY_CARE_PROVIDER_SITE_OTHER): Payer: Medicare Other | Admitting: Internal Medicine

## 2019-06-08 ENCOUNTER — Other Ambulatory Visit: Payer: Self-pay

## 2019-06-08 VITALS — Ht 66.0 in | Wt 192.0 lb

## 2019-06-08 DIAGNOSIS — I255 Ischemic cardiomyopathy: Secondary | ICD-10-CM

## 2019-06-08 NOTE — Progress Notes (Signed)
Electrophysiology TeleHealth Note   Due to national recommendations of social distancing due to COVID 19, an audio/video telehealth visit is felt to be most appropriate for this patient at this time.  See MyChart message from today for the patient's consent to telehealth for [facility].   Date:  06/08/2019   ID:  Ronnie Chimes., DOB [birth year], MRN [id number]  Location: patient's home  Provider location: [address] [place]  Evaluation Performed: Follow-up visit  PCP:  Derinda Late, MD  Cardiologist:     Electrophysiologist:  SK   Chief Complaint:  cardiomyopahty  History of Present Illness:    Ronnie Chimes. is a 78 y.o. male who presents via audio/video conferencing for a telehealth visit today.  Since last being seen in our clinic for syncope with complete heart block ischemic cardiomyopathy and CRT-D, the patient reports increasing fataigue    No chest pain or edema; sleeps on pillows ( wedge) 2/2 sinus drainage  With covid he has put on 15lbs    Date Procedure Brand-Generator Comments  6/07 Dual Chamber ICD Medtronic  [phone]  8/12 Gen Change LV lead insertion/ ICD lead replacment    3/17 Generator Replacment  Medtronic        DATE TEST EF   2012 Cath  40 % Patent LAD stent  12/15 Echo   55-65 %           Date K Cr Hgb  6/18 4.3 0.9 14  2/19 4.1 0.9 12.5  1/20 4.5 0.9 14.1   He has atrial fibrillation-- thromboembolic risk profile is notable for TIA, hypertension, age , vascular disease and heart failure  CHADS-VASc score is 6  Is on apixoban .    The patient denies symptoms of fevers, chills, cough, or new SOB worrisome for COVID 19.    Past Medical History:  Diagnosis Date  . AICD (automatic cardioverter/defibrillator) present   . Allergic rhinitis   . Arthritis   . Asthma   . Biventricular defibrillator-Medtronic    Generator replacement 2012 with CRT upgrade  . BPH (benign prostatic hyperplasia)   .  Cardiac arrest ([place])   . Dyspnea    PFT 12/19/10: FEV1 2.93(107%), FEV1% 72, TLC 8.02(138%), DLCO 110%, +BD  . ED (erectile dysfunction)   . GERD (gastroesophageal reflux disease)   . GERD (gastroesophageal reflux disease)   . High-grade heart block   . History of kidney stones   . Hyperlipidemia   . Hypertension   . 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 Myocardial infarction ([place])   . Sciatic nerve pain   . Severe sinus bradycardia   . Syncope       . TIA (transient ischemic attack) nov. 2011    Past Surgical History:  Procedure Laterality Date  . CATARACT EXTRACTION, BILATERAL    . COLONOSCOPY WITH PROPOFOL N/A 04/01/2018   Procedure: COLONOSCOPY WITH PROPOFOL;  Surgeon: [name], MD;  Location: [facility] ENDOSCOPY;  Service: Endoscopy;  Laterality: N/A;  . CORONARY ANGIOPLASTY    . EP IMPLANTABLE DEVICE N/A 03/18/2016   Procedure:  BiV ICD Generator Changeout;  Surgeon: [name], MD;  Location: [place] CV LAB;  Service: Cardiovascular;  Laterality: N/A;  . ESOPHAGOGASTRODUODENOSCOPY (EGD) WITH PROPOFOL N/A 04/01/2018   Procedure: ESOPHAGOGASTRODUODENOSCOPY (EGD) WITH PROPOFOL;  Surgeon: [name], MD;  Location: [facility] END[facility];  Service: Endoscopy;  Laterality: N/A;  . EYE SURGERY Bilateral    cataract extraction  . Implantation of Medtronic dual-chamber cardioverter    . JOINT REPLACEMENT Left    total knee replacement  . Left Knee Replacement  2006  . PROSTATE BIOPSY    . TONSILLECTOMY  1948  . TOTAL KNEE REVISION Left 06/20/2015   Procedure: TOTAL KNEE REVISION;  Surgeon: [name], MD;  Location: [facility] ORS;  Service: Orthopedics;  Laterality: Left;    Current Outpatient Medications  Medication Sig Dispense Refill  . atorvastatin (LIPITOR) 80 MG tablet Take 1 tablet (80 mg total) by mouth daily. 90 tablet 3  . bisoprolol (ZEBETA) 5 MG tablet  Take 1 tablet (5 mg total) by mouth daily. 30 tablet 6  . Calcium Citrate-Vitamin D (CITRACAL + D PO) Take by mouth daily.    . dorzolamide (TRUSOPT) 2 % ophthalmic solution 1 drop 3 (three) times daily.    [name] dutasteride (AVODART) 0.5 MG capsule Take 0.5 mg by mouth 3 (three) times a week.      [name] ELIQUIS 5 MG TABS tablet TAKE ONE TABLET BY MOUTH TWICE DAILY 180 tablet 1  . latanoprost (XALATAN) 0.005 % ophthalmic solution Place 1 drop into the right eye at bedtime.     [name] losartan (COZAAR) 50 MG tablet Take 50 mg by mouth daily.    . montelukast (SINGULAIR) 10 MG tablet Take 1 tablet by mouth daily.    . Multiple Vitamin (MULTIVITAMIN) tablet Take 1 tablet by mouth daily.      [name] omeprazole (PRILOSEC) 40 MG capsule Take 1 capsule by mouth daily.    . metFORMIN (GLUCOPHAGE-XR) 500 MG 24 hr tablet Take 1 tablet by mouth daily.    . traZODone (DESYREL) 100 MG tablet Take 100 mg by mouth at bedtime.     [name] zolpidem (AMBIEN) 5 MG tablet Take 10 mg by mouth at bedtime as needed for sleep.      No current facility-administered medications for this visit.     Allergies:   Beta adrenergic blockers, Oxycodone, and Sulfonamide derivatives   Social History:  The patient  reports that he has never smoked. He has never used smokeless tobacco. He reports current alcohol use. He reports that he does not use drugs.   Family History:  The patient's   family history includes Cirrhosis in his mother; Emphysema in his father; Hypertension in his brother and son; Lung cancer in his father.   ROS:  Please see the history of present illness.   All other systems are personally reviewed and negative.    Exam:    Vital Signs:  Ht 5\' 6"  (1.676 m)   Wt 192 lb (87.1 kg)   BMI 30.99 kg/m     Well appearing, alert and conversant, regular work of breathing,  good skin color Eyes- anicteric, neuro- grossly intact, skin- no apparent rash or lesions or cyanosis, mouth- oral mucosa is pink   Labs/Other Tests and Data  Reviewed:    Recent Labs: No results found for requested labs within last 8760 hours.   Wt Readings from Last 3 Encounters:  06/08/19 192 lb (87.1 kg)  02/15/19 193 lb 12.8 oz (87.9 kg)  07/28/18 185 lb 11.2 oz (84.2 kg)     Other studies personally reviewed: Additional studies/ records that were reviewed today include. As above  Last device remote is reviewed from [place] PDF dated 4/20 which reveals normal device function,   arrhythmias -Atrial fibrillation >> 8hrs *  ASSESSMENT & PLAN:    Ischemic Cardiomyopathy -- interval normalization  Complete heart block    Implantable defibrillator-CRT Medtronic     Atrial fibrillation    Hypertension  HFpEF  Hyperlipidemia   With increasing fatigue, we looked specifically at heart rate histograms.  He has about 95% of his beats at less than 90 bpm which is just below his ADL rate.  There is also a small blip at about 120 with scant beats in between.  We discussed the potential that his heart rate response to exercise needs reprogramming.  We will bring him into the office for exercise testing with reprogramming  We will also reevaluate LV function; last in 2015  Chronic low-grade edema  Interval atrial fibrillation but has been intermittent and unassociated with a rapid rate; only nuisance bleeding on Eliquis  COVID has been a real problem because of quarantine and inability to exercise.  No room for exercise equipment; suggested he try to [place] 19 screen The patient denies symptoms of COVID 19 at this time.  The importance of social distancing was discussed today.  Follow-up:   46m Next remote: As Scheduled   Current medicines are reviewed at length with the patient today.   The patient does not have concerns regarding his medicines.  The following changes were made today:  none  Labs/ tests ordered today include:   GXT with programmer ( and Me) Echo LV function      Future tests ( post COVID )   in    months  Patient Risk:  after full review of this patients clinical status, I feel that they are at moderate risk at this time.  Today, I have spent 21 minutes with the patient with telehealth technology discussing the above.  Signed, Virl Axe, MD  06/08/2019 11:55 AM     [address] [place] [address] [phone] (office) [phone] (fax)

## 2019-06-09 NOTE — Patient Instructions (Addendum)
Medication Instructions:  - Your physician recommends that you continue on your current medications as directed. Please refer to the Current Medication list given to you today.  If you need a refill on your cardiac medications before your next appointment, please call your pharmacy.   Lab work: - none ordered  If you have labs (blood work) drawn today and your tests are completely normal, you will receive your results only by: [name] MyChart Message (if you have MyChart) OR . A paper copy in the mail If you have any lab test that is abnormal or we need to change your treatment, we will call you to review the results.  Testing/Procedures: - Your physician has requested that you have an echocardiogram on a day Dr. [name] is in the office (will need device reprogramming).  Echocardiography is a painless test that uses sound waves to create images of your heart. It provides your doctor with information about the size and shape of your heart and how well your heart's chambers and valves are working. This procedure takes approximately one hour. There are no restrictions for this procedure.   Follow-Up: At [facility], you and your health needs are our priority.  As part of our continuing mission to provide you with exceptional heart care, we have created designated Provider Care Teams.  These Care Teams include your primary Cardiologist (physician) and Advanced Practice Providers (APPs -  Physician Assistants and Nurse Practitioners) who all work together to provide you with the care you need, when you need it.  You will need a follow up appointment in 6 months (December) with Dr. [name].   Please call our office 2 months in advance to schedule this appointment.   (Call in early October to schedule).   Remote monitoring is used to monitor your Pacemaker of ICD from home. This monitoring reduces the number of office visits required to check your device to one time per year. It allows [place] to keep an eye on  the functioning of your device to ensure it is working properly. You are scheduled for a device check from home on 06/27/2019. You may send your transmission at any time that day. If you have a wireless device, the transmission will be sent automatically. After your physician reviews your transmission, you will receive a postcard with your next transmission date.   Any Other Special Instructions Will Be Listed Below (If Applicable). - N/A

## 2019-06-27 ENCOUNTER — Ambulatory Visit (INDEPENDENT_AMBULATORY_CARE_PROVIDER_SITE_OTHER): Payer: Medicare Other | Admitting: *Deleted

## 2019-06-27 DIAGNOSIS — I5022 Chronic systolic (congestive) heart failure: Secondary | ICD-10-CM

## 2019-06-27 DIAGNOSIS — I255 Ischemic cardiomyopathy: Secondary | ICD-10-CM

## 2019-06-27 LAB — CUP PACEART REMOTE DEVICE CHECK
Battery Remaining Longevity: 29 mo
Battery Voltage: 2.95 V
Brady Statistic AP VP Percent: 94.34 %
Brady Statistic AP VS Percent: 0.01 %
Brady Statistic AS VP Percent: 5.64 %
Brady Statistic AS VS Percent: 0.01 %
Brady Statistic RA Percent Paced: 93.66 %
Brady Statistic RV Percent Paced: 99.69 %
Date Time Interrogation Session: 20200707042203
HighPow Impedance: 50 Ohm
HighPow Impedance: 62 Ohm
Implantable Lead Implant Date: 20070615
Implantable Lead Implant Date: 20120813
Implantable Lead Implant Date: 20120813
Implantable Lead Location: 753858
Implantable Lead Location: 753859
Implantable Lead Location: 753860
Implantable Lead Model: 4396
Implantable Lead Model: 5076
Implantable Lead Model: 7121
Implantable Pulse Generator Implant Date: 20170329
Lead Channel Impedance Value: 228 Ohm
Lead Channel Impedance Value: 361 Ohm
Lead Channel Impedance Value: 361 Ohm
Lead Channel Impedance Value: 456 Ohm
Lead Channel Impedance Value: 589 Ohm
Lead Channel Impedance Value: 893 Ohm
Lead Channel Pacing Threshold Amplitude: 1 V
Lead Channel Pacing Threshold Pulse Width: 0.4 ms
Lead Channel Sensing Intrinsic Amplitude: 0.625 mV
Lead Channel Sensing Intrinsic Amplitude: 0.625 mV
Lead Channel Sensing Intrinsic Amplitude: 7 mV
Lead Channel Sensing Intrinsic Amplitude: 7 mV
Lead Channel Setting Pacing Amplitude: 1.75 V
Lead Channel Setting Pacing Amplitude: 2 V
Lead Channel Setting Pacing Amplitude: 2.5 V
Lead Channel Setting Pacing Pulse Width: 0.4 ms
Lead Channel Setting Pacing Pulse Width: 0.8 ms
Lead Channel Setting Sensing Sensitivity: 0.3 mV

## 2019-07-04 ENCOUNTER — Telehealth: Payer: Self-pay

## 2019-07-04 NOTE — Telephone Encounter (Signed)
COVID-19 Pre-Screening Questions:   ?   In the past 7 to 10 days have you had a cough, shortness of breath, headache, congestion, fever (100 or greater) body aches, chills, sore throat, or sudden loss of taste or sense of smell?  No Have you been around anyone with known Covid 19. No  Have you been around anyone who is awaiting Covid 19 test results in the past 7 to 10 days? NO  Have you been around anyone who has been exposed to Covid 19, or has mentioned symptoms of Covid 19 within the past 7 to 10 days? No  If you have any concerns/questions about symptoms patients report during screening (either on the phone or at threshold). Contact the provider seeing the patient or DOD for further guidance. If neither are available contact a member of the leadership team."

## 2019-07-05 ENCOUNTER — Telehealth: Payer: Self-pay | Admitting: Internal Medicine

## 2019-07-05 NOTE — Telephone Encounter (Signed)

## 2019-07-06 ENCOUNTER — Ambulatory Visit (INDEPENDENT_AMBULATORY_CARE_PROVIDER_SITE_OTHER): Payer: Medicare Other

## 2019-07-06 ENCOUNTER — Encounter: Payer: Self-pay | Admitting: Internal Medicine

## 2019-07-06 ENCOUNTER — Other Ambulatory Visit: Payer: Self-pay

## 2019-07-06 ENCOUNTER — Ambulatory Visit (INDEPENDENT_AMBULATORY_CARE_PROVIDER_SITE_OTHER): Payer: Medicare Other | Admitting: Internal Medicine

## 2019-07-06 VITALS — BP 160/98 | HR 63 | Ht 66.0 in | Wt 195.0 lb

## 2019-07-06 DIAGNOSIS — I255 Ischemic cardiomyopathy: Secondary | ICD-10-CM

## 2019-07-06 DIAGNOSIS — I5022 Chronic systolic (congestive) heart failure: Secondary | ICD-10-CM | POA: Diagnosis not present

## 2019-07-06 DIAGNOSIS — Z95810 Presence of automatic (implantable) cardiac defibrillator: Secondary | ICD-10-CM | POA: Diagnosis not present

## 2019-07-06 DIAGNOSIS — I459 Conduction disorder, unspecified: Secondary | ICD-10-CM | POA: Diagnosis not present

## 2019-07-06 MED ORDER — PERFLUTREN LIPID MICROSPHERE
1.0000 mL | INTRAVENOUS | Status: AC | PRN
  Administered 2019-07-06: 2 mL via INTRAVENOUS

## 2019-07-06 NOTE — Progress Notes (Signed)
Patient Care Team: Derinda Late, MD as PCP - General (Family Medicine) Rocco Serene, MD (Unknown Physician Specialty)   HPI  [name]. is a 78 y.o. male Seen in followup for ICD implantation initially for syncope in the setting of depressed left ventricular function. He had a 6949-lead. He reached ERI summer 2012 and underwent device explantation with new defibrillator lead insertion with left ventricular lead placement and generator replacement.   He underwent device generator replacement again 3/17      DATE TEST EF   2012 Cath  40 % Patent LAD stent  12/15 Echo   55-65 %           Date K Cr Hgb  6/18 4.3 0.9 14  2/19 4.1 0.9 12.5   He has atrial fibrillation.   His thromboembolic risk profile is notable for TIA, hypertension, age , vascular disease and heart failure. His CHADS-2 score his 4 and his CHADS-VASc score is 6  Is on apixoban  No  bleeding issues   When seen with telehealth visit  6/20 concern was worsening fatigue and HR histogram on device suggested irate response MIGHT BE CONTRIBUTING  Brought into the office for reprogrammiing   He and his wife are moving from their home of 35 years to  independent living.  It has been  exceedingly stressful but is much better even on e month later    Past Medical History:  Diagnosis Date  . AICD (automatic cardioverter/defibrillator) present   . Allergic rhinitis   . Arthritis   . Asthma   . Biventricular defibrillator-Medtronic    Generator replacement 2012 with CRT upgrade  . BPH (benign prostatic hyperplasia)   . Cardiac arrest ([place])   . Dyspnea    PFT 12/19/10: FEV1 2.93(107%), FEV1% 72, TLC 8.02(138%), DLCO 110%, +BD  . ED (erectile dysfunction)   . GERD (gastroesophageal reflux disease)   . GERD (gastroesophageal reflux disease)   . High-grade heart block   . History of kidney stones   . Hyperlipidemia   . Hypertension   . ischemic cardiomyopathy    stent to a 95%-occluded LAD in 1995;  catheterization in 2012 demonstrated patent grafts and nonobstructive disease  . Myocardial infarction ([place])   . Sciatic nerve pain   . Severe sinus bradycardia   . Syncope       . TIA (transient ischemic attack) nov. 2011    Past Surgical History:  Procedure Laterality Date  . CATARACT EXTRACTION, BILATERAL    . COLONOSCOPY WITH PROPOFOL N/A 04/01/2018   Procedure: COLONOSCOPY WITH PROPOFOL;  Surgeon: [name], MD;  Location: [facility] ENDOSCOPY;  Service: Endoscopy;  Laterality: N/A;  . CORONARY ANGIOPLASTY    . EP IMPLANTABLE DEVICE N/A 03/18/2016   Procedure:  BiV ICD Generator Changeout;  Surgeon: [name], MD;  Location: [place] CV LAB;  Service: Cardiovascular;  Laterality: N/A;  . ESOPHAGOGASTRODUODENOSCOPY (EGD) WITH PROPOFOL N/A 04/01/2018   Procedure: ESOPHAGOGASTRODUODENOSCOPY (EGD) WITH PROPOFOL;  Surgeon: [name], MD;  Location: [facility] ENDOSCOPY;  Service: Endoscopy;  Laterality: N/A;  . EYE SURGERY Bilateral    cataract extraction  . Implantation of Medtronic dual-chamber cardioverter    . JOINT REPLACEMENT Left    total knee replacement  . Left Knee Replacement  2006  . PROSTATE BIOPSY    . TONSILLECTOMY  1948  . TOTAL KNEE REVISION Left 06/20/2015   Procedure: TOTAL KNEE REVISION;  Surgeon: [name], MD;  Location: [facility] ORS;  Service: Orthopedics;  Laterality: Left;    Current Outpatient Medications  Medication Sig Dispense Refill  . atorvastatin (LIPITOR) 80 MG tablet Take 1 tablet (80 mg total) by mouth daily. 90 tablet 3  . bisoprolol (ZEBETA) 5 MG tablet Take 1 tablet (5 mg total) by mouth daily. 30 tablet 6  . Calcium Citrate-Vitamin D (CITRACAL + D PO) Take by mouth daily.    . dorzolamide (TRUSOPT) 2 % ophthalmic solution 1 drop 3 (three) times daily.    [name] dutasteride (AVODART) 0.5 MG capsule Take 0.5 mg by mouth 3 (three) times a week.      [name] ELIQUIS 5 MG TABS tablet TAKE ONE TABLET BY MOUTH TWICE DAILY 180 tablet 1  . latanoprost  (XALATAN) 0.005 % ophthalmic solution Place 1 drop into the right eye at bedtime.     [name] losartan (COZAAR) 50 MG tablet Take 50 mg by mouth daily.    . montelukast (SINGULAIR) 10 MG tablet Take 1 tablet by mouth daily.    . Multiple Vitamin (MULTIVITAMIN) tablet Take 1 tablet by mouth daily.      [name] omeprazole (PRILOSEC) 40 MG capsule Take 1 capsule by mouth daily.    . traZODone (DESYREL) 100 MG tablet Take 100 mg by mouth at bedtime.     [name] zolpidem (AMBIEN) 5 MG tablet Take 10 mg by mouth at bedtime as needed for sleep.     . metFORMIN (GLUCOPHAGE-XR) 500 MG 24 hr tablet Take 2 tablets by mouth daily.      No current facility-administered medications for this visit.     Allergies  Allergen Reactions  . Beta Adrenergic Blockers Other (See Comments)    Fatigue  . Oxycodone Nausea And Vomiting  . Sulfonamide Derivatives Nausea And Vomiting    Review of Systems negative except from HPI and PMH  Physical Exam BP (!) 160/98 (BP Location: Right Arm, Patient Position: Sitting, Cuff Size: Normal)   Pulse 63   Ht 5\' 6"  (1.676 m)   Wt 195 lb (88.5 kg)   BMI 31.47 kg/m  Well developed and well nourished in no acute distress HENT normal Neck supple with JVP-flat Clear Device pocket well healed; without hematoma or erythema.  There is no tethering  Regular rate and rhythm, no  gallop No  murmur Abd-soft with active BS No Clubbing cyanosis  edema Skin-warm and dry A & Oriented  Grossly normal sensory and motor function  ECG AV pacing    Assessment and  Plan \ Ischemic Cardiomyopathy -- interval normalization  Complete heart block    Implantable defibrillator-CRT Medtronic   Atrial fibrillation paroxysmal  Bradycardia-sinus  Hypertension  HFpEF  Hyperlipidemia    Symptoms improved With reprogramming by decreasing slope of exertion response 5>>3 and ADL rate 90>>50  Will followup as outpt and see how things go   Euvolemic continue current meds On Anticoagulation;  No  bleeding issues   BP well controlled

## 2019-07-06 NOTE — Patient Instructions (Signed)

## 2019-07-08 ENCOUNTER — Encounter: Payer: Self-pay | Admitting: Cardiology

## 2019-07-08 NOTE — Progress Notes (Signed)
Remote ICD transmission.   

## 2019-08-11 ENCOUNTER — Other Ambulatory Visit: Payer: Medicare Other

## 2019-08-11 ENCOUNTER — Other Ambulatory Visit: Payer: Self-pay

## 2019-08-11 DIAGNOSIS — R972 Elevated prostate specific antigen [PSA]: Secondary | ICD-10-CM

## 2019-08-12 LAB — PSA: Prostate Specific Ag, Serum: 4.2 ng/mL — ABNORMAL HIGH (ref 0.0–4.0)

## 2019-08-16 ENCOUNTER — Encounter: Payer: Self-pay | Admitting: Urology

## 2019-08-16 ENCOUNTER — Ambulatory Visit (INDEPENDENT_AMBULATORY_CARE_PROVIDER_SITE_OTHER): Payer: Medicare Other | Admitting: Urology

## 2019-08-16 ENCOUNTER — Other Ambulatory Visit: Payer: Self-pay

## 2019-08-16 VITALS — BP 111/71 | HR 69 | Ht 66.0 in | Wt 193.5 lb

## 2019-08-16 DIAGNOSIS — R972 Elevated prostate specific antigen [PSA]: Secondary | ICD-10-CM | POA: Diagnosis not present

## 2019-08-16 DIAGNOSIS — N400 Enlarged prostate without lower urinary tract symptoms: Secondary | ICD-10-CM | POA: Diagnosis not present

## 2019-08-16 DIAGNOSIS — I255 Ischemic cardiomyopathy: Secondary | ICD-10-CM | POA: Diagnosis not present

## 2019-08-16 NOTE — Progress Notes (Signed)
08/16/2019 2:50 PM   Ronnie April Jr. [birth year] [id number]  Referring provider: Derinda Late, MD [phone] [address] [facility] [place],  [place] [address]  No chief complaint on file.   HPI:  F/u  --   1) BPH - formanyyears on Avodart monotherapy 2-3 times per week (10 per month). A CT scan report from April 2018 described punctate renal stones and a 5 to 6 mm bladder stone.Follow-up KUB report May 2018 revealed bladder stoneswhich wereremoved with a cystolithopaxy with Dr. Jacqlyn Larsen. BPH was noted with a transverse measurement of 4.9 cm on the prostate.PVR was 37 ml. AUASS = 11, weak st. Most satisfied.Tried tamsulosin in the past.  2) PSA elevation - PSA as high as 6.2 off dutasteridein 2017. PSA was 3.12 (6.24) in June 2019 and 3.7 in June 2018. His father had prostate cancer. He is on Eliquis for a fib. He recallsPSA usually"2-5" or 4-10. He's had three biopsies. Dr. Bernardo Heater did one and Dr. Jacqlyn Larsen did two with the last one around2015.  PMH sig for pacemaker and Eliquis use.   3) kidney stones - CT report from April 2018 revealed punctate bilateral stones. A 24-hour urine report from 2017 revealed low urine output and increase fluid intake and dietary citrate was recommended. He passed a stone about three years ago. No recentflank pain or stone passage. F/u KUB was negative 02/16/2019.   He returns and PSA was 4.2 (8.4) on 08/11/2019, but his PSA was 3.35 (6.7)  on 06/22/2019 with PCP. No voiding complaints.    PMH: Past Medical History:  Diagnosis Date  . AICD (automatic cardioverter/defibrillator) present   . Allergic rhinitis   . Arthritis   . Asthma   . Biventricular defibrillator-Medtronic    Generator replacement 2012 with CRT upgrade  . BPH (benign prostatic hyperplasia)   . Cardiac arrest ([place])   . Dyspnea    PFT 12/19/10: FEV1 2.93(107%), FEV1% 72, TLC 8.02(138%), DLCO 110%, +BD  . ED (erectile dysfunction)    . GERD (gastroesophageal reflux disease)   . GERD (gastroesophageal reflux disease)   . High-grade heart block   . History of kidney stones   . Hyperlipidemia   . Hypertension   . 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 Myocardial infarction ([place])   . Sciatic nerve pain   . Severe sinus bradycardia   . Syncope       . TIA (transient ischemic attack) nov. 2011    Surgical History: Past Surgical History:  Procedure Laterality Date  . CATARACT EXTRACTION, BILATERAL    . COLONOSCOPY WITH PROPOFOL N/A 04/01/2018   Procedure: COLONOSCOPY WITH PROPOFOL;  Surgeon: [name], MD;  Location: [facility] ENDOSCOPY;  Service: Endoscopy;  Laterality: N/A;  . CORONARY ANGIOPLASTY    . EP IMPLANTABLE DEVICE N/A 03/18/2016   Procedure:  BiV ICD Generator Changeout;  Surgeon: [name], MD;  Location: [place] CV LAB;  Service: Cardiovascular;  Laterality: N/A;  . ESOPHAGOGASTRODUODENOSCOPY (EGD) WITH PROPOFOL N/A 04/01/2018   Procedure: ESOPHAGOGASTRODUODENOSCOPY (EGD) WITH PROPOFOL;  Surgeon: [name], MD;  Location: [facility] ENDOSCOPY;  Service: Endoscopy;  Laterality: N/A;  . EYE SURGERY Bilateral    cataract extraction  . Implantation of Medtronic dual-chamber cardioverter    . JOINT REPLACEMENT Left    total knee replacement  . Left Knee Replacement  2006  . PROSTATE BIOPSY    .  TONSILLECTOMY  1948  . TOTAL KNEE REVISION Left 06/20/2015   Procedure: TOTAL KNEE REVISION;  Surgeon: [name], MD;  Location: [facility] ORS;  Service: Orthopedics;  Laterality: Left;    Home Medications:  Allergies as of 08/16/2019      Reactions   Beta Adrenergic Blockers Other (See Comments)   Fatigue   Oxycodone Nausea And Vomiting   Sulfonamide Derivatives Nausea And Vomiting      Medication List       Accurate as of August 16, 2019  2:50 PM. If you have any questions, ask your nurse  or doctor.        atorvastatin 80 MG tablet Commonly known as: LIPITOR Take 1 tablet (80 mg total) by mouth daily.   bisoprolol 5 MG tablet Commonly known as: ZEBETA Take 1 tablet (5 mg total) by mouth daily.   CITRACAL + D PO Take by mouth daily.   dorzolamide 2 % ophthalmic solution Commonly known as: TRUSOPT 1 drop 3 (three) times daily.   dutasteride 0.5 MG capsule Commonly known as: AVODART Take 0.5 mg by mouth 3 (three) times a week.   Eliquis 5 MG Tabs tablet Generic drug: apixaban TAKE ONE TABLET BY MOUTH TWICE DAILY   latanoprost 0.005 % ophthalmic solution Commonly known as: XALATAN Place 1 drop into the right eye at bedtime.   losartan 50 MG tablet Commonly known as: COZAAR Take 50 mg by mouth daily.   metFORMIN 500 MG 24 hr tablet Commonly known as: GLUCOPHAGE-XR Take 2 tablets by mouth daily.   montelukast 10 MG tablet Commonly known as: SINGULAIR Take 1 tablet by mouth daily.   multivitamin tablet Take 1 tablet by mouth daily.   omeprazole 40 MG capsule Commonly known as: PRILOSEC Take 1 capsule by mouth daily.   traZODone 100 MG tablet Commonly known as: DESYREL Take 100 mg by mouth at bedtime.   zolpidem 5 MG tablet Commonly known as: AMBIEN Take 10 mg by mouth at bedtime as needed for sleep.       Allergies:  Allergies  Allergen Reactions  . Beta Adrenergic Blockers Other (See Comments)    Fatigue  . Oxycodone Nausea And Vomiting  . Sulfonamide Derivatives Nausea And Vomiting    Family History: Family History  Problem Relation Age of Onset  . Emphysema Father   . Lung cancer Father   . Cirrhosis Mother   . Hypertension Brother   . Hypertension Son   . Heart attack Neg Hx   . Stroke Neg Hx     Social History:  reports that he has never smoked. He has never used smokeless tobacco. He reports current alcohol use. He reports that he does not use drugs.  ROS:                                         Physical Exam: There were no vitals taken for this visit.  Constitutional:  Alert and oriented, No acute distress. HEENT: [place] AT, moist mucus membranes.  Trachea midline, no masses. Cardiovascular: No clubbing, cyanosis, or edema. Respiratory: Normal respiratory effort, no increased work of breathing. GI: Abdomen is soft, nontender, nondistended, no abdominal masses GU: No CVA tenderness DRE: Prostate 75 g, smooth without hard area or nodule Skin: No rashes, bruises or suspicious lesions. Neurologic: Grossly intact, no focal deficits, moving all 4 extremities. Psychiatric: Normal mood and affect.  Laboratory Data:  Lab Results  Component Value Date   WBC 6.2 12/25/2016   HGB 12.8 (L) 12/25/2016   HCT 37.8 (L) 12/25/2016   MCV 90.7 12/25/2016   PLT 113 (L) 12/25/2016    Lab Results  Component Value Date   CREATININE 0.92 12/25/2016    No results found for: PSA  No results found for: TESTOSTERONE  No results found for: HGBA1C  Urinalysis    Component Value Date/Time   COLORURINE YELLOW (A) 12/25/2016 0652   APPEARANCEUR Clear 07/28/2018 1107   LABSPEC 1.026 12/25/2016 0652   LABSPEC 1.021 06/02/2014 1320   PHURINE 5.0 12/25/2016 0652   GLUCOSEU Negative 07/28/2018 1107   GLUCOSEU Negative 06/02/2014 [place] 12/25/2016 0652   BILIRUBINUR Negative 07/28/2018 1107   BILIRUBINUR Negative 06/02/2014 1320   KETONESUR NEGATIVE 12/25/2016 0652   PROTEINUR 1+ (A) 07/28/2018 1107   PROTEINUR 100 (A) 12/25/2016 0652   NITRITE Negative 07/28/2018 1107   NITRITE NEGATIVE 12/25/2016 0652   LEUKOCYTESUR Negative 07/28/2018 1107   LEUKOCYTESUR Trace 06/02/2014 1320    Lab Results  Component Value Date   LABMICR See below: 07/28/2018   WBCUA 0-5 07/28/2018   RBCUA 0-2 07/28/2018   LABEPIT 0-10 07/28/2018   MUCUS Present (A) 07/28/2018   BACTERIA Few (A) 07/28/2018    Pertinent Imaging: n/a Results for orders placed during the hospital encounter of  02/15/19  Abdomen 1 view (KUB)   Narrative CLINICAL DATA:  History of kidney stones, follow-up examination  EXAM: ABDOMEN - 1 VIEW  COMPARISON:  07/28/2018  FINDINGS: Scattered large and small bowel gas is noted. Previously seen tiny calcification over the left kidney is not well appreciated on today's exam and may have been related to bowel content. Multiple phleboliths are seen within the pelvis. Degenerative changes of the lumbar spine are noted with mild scoliosis concave to the right.  IMPRESSION: No definitive renal or ureteral calculi are noted.   Electronically Signed   By: Inez Catalina M.D.   On: 02/16/2019 08:42    No results found for this or any previous visit. No results found for this or any previous visit. No results found for this or any previous visit. No results found for this or any previous visit. No results found for this or any previous visit. No results found for this or any previous visit. No results found for this or any previous visit.  Assessment & Plan:    PSA - discussed PSA up slightly compared to last year but in the realm of his prior levels and his PSA was stable last month. Discussed PSA elevation can be PCa or BPH. Nl DRE with a large prostate. He cant get MRI (pacer) and on eliquis increasing risk of bx - he will cont surveillance. PSA in 6 mo, f/u in 1 year.   BPH - stable   No follow-ups on file.  Festus Aloe, MD  [facility] [address], [place], [place] [address] [phone]

## 2019-08-16 NOTE — Patient Instructions (Signed)
Prostate-Specific Antigen Test Why am I having this test? The prostate-specific antigen (PSA) test is a screening test for prostate cancer. It can identify early signs of prostate cancer, which may allow for more effective treatment. Your health care provider may recommend that you have a PSA test starting at age 77 or that you have one earlier or later, depending on your risk factors for prostate cancer. You may also have a PSA test:  To monitor treatment of prostate cancer.  To check whether prostate cancer has returned after treatment.  If you have signs of other conditions that can affect PSA levels, such as: ? An enlarged prostate that is not caused by cancer (benign prostatic hyperplasia, BPH). This condition is very common in older men. ? A prostate infection. What is being tested? This test measures the amount of PSA in your blood. PSA is a protein that is made in the prostate. The prostate naturally produces more PSA as you age, but very high levels may be a sign of a medical condition. What kind of sample is taken?  A blood sample is required for this test. It is usually collected by inserting a needle into a blood vessel or by sticking a finger with a small needle. Blood for this test should be drawn before having an exam of the prostate. How do I prepare for this test? Do not ejaculate starting 24 hours before your test, or as long as told by your health care provider. Tell a health care provider about:  Any allergies you have.  All medicines you are taking, including vitamins, herbs, eye drops, creams, and over-the-counter medicines. This also includes: ? Medicines to assist with hair growth, such as finasteride. ? Any recent exposure to a medicine called diethylstilbestrol.  Any blood disorders you have.  Any recent procedures you have had, especially any procedures involving the prostate or rectum.  Any medical conditions you have.  Any recent urinary tract infections  (UTIs) you have had. How are the results reported? Your test results will be reported as a value that indicates how much PSA is in your blood. This will be given as nanograms of PSA per milliliter of blood (ng/mL). Your health care provider will compare your results to normal ranges that were established after testing a large group of people (reference ranges). Reference ranges may vary among labs and hospitals. PSA levels vary from person to person and generally increase with age. Because of this variation, there is no single PSA value that is considered normal for everyone. Instead, PSA reference ranges are used to describe whether your PSA levels are considered low or high (elevated). Common reference ranges are:  Low: 0-2.5 ng/mL.  Slightly to moderately elevated: 2.6-10.0 ng/mL.  Moderately elevated: 10.0-19.9 ng/mL.  Significantly elevated: 20 ng/mL or greater. Sometimes, the test results may report that a condition is present when it is not present (false-positive result). What do the results mean? A test result that is higher than 4 ng/mL may mean that you are at an increased risk for prostate cancer. However, a PSA test by itself is not enough to diagnose prostate cancer. High PSA levels may also be caused by the natural aging process, prostate infection, or BPH. PSA screening cannot tell you if your PSA is high due to cancer or a different cause. A prostate biopsy is the only way to diagnose prostate cancer. A risk of having the PSA test is diagnosing and treating prostate cancer that would never have caused any   symptoms or problems (overdiagnosis and overtreatment). Talk with your health care provider about what your results mean. Questions to ask your health care provider Ask your health care provider, or the department that is doing the test:  When will my results be ready?  How will I get my results?  What are my treatment options?  What other tests do I need?  What are my  next steps? Summary  The prostate-specific antigen (PSA) test is a screening test for prostate cancer.  Your health care provider may recommend that you have a PSA test starting at age 77 or that you have one earlier or later, depending on your risk factors for prostate cancer.  A test result that is higher than 4 ng/mL may mean that you are at an increased risk for prostate cancer. However, elevated levels can be caused by a number of conditions other than prostate cancer.  Talk with your health care provider about what your results mean. This information is not intended to replace advice given to you by your health care provider. Make sure you discuss any questions you have with your health care provider. Document Released: 01/09/2005 Document Revised: 11/19/2017 Document Reviewed: 09/13/2017 Elsevier Patient Education  2020 Elsevier Inc.  

## 2019-09-26 ENCOUNTER — Ambulatory Visit (INDEPENDENT_AMBULATORY_CARE_PROVIDER_SITE_OTHER): Payer: Medicare Other | Admitting: *Deleted

## 2019-09-26 DIAGNOSIS — I480 Paroxysmal atrial fibrillation: Secondary | ICD-10-CM | POA: Diagnosis not present

## 2019-09-26 DIAGNOSIS — I255 Ischemic cardiomyopathy: Secondary | ICD-10-CM

## 2019-09-27 LAB — CUP PACEART REMOTE DEVICE CHECK
Battery Remaining Longevity: 24 mo
Battery Voltage: 2.94 V
Brady Statistic AP VP Percent: 98.9 %
Brady Statistic AP VS Percent: 0.04 %
Brady Statistic AS VP Percent: 1.06 %
Brady Statistic AS VS Percent: 0 %
Brady Statistic RA Percent Paced: 98.51 %
Brady Statistic RV Percent Paced: 99.72 %
Date Time Interrogation Session: 20201006062604
HighPow Impedance: 50 Ohm
HighPow Impedance: 61 Ohm
Implantable Lead Implant Date: 20070615
Implantable Lead Implant Date: 20120813
Implantable Lead Implant Date: 20120813
Implantable Lead Location: 753858
Implantable Lead Location: 753859
Implantable Lead Location: 753860
Implantable Lead Model: 4396
Implantable Lead Model: 5076
Implantable Lead Model: 7121
Implantable Pulse Generator Implant Date: 20170329
Lead Channel Impedance Value: 228 Ohm
Lead Channel Impedance Value: 361 Ohm
Lead Channel Impedance Value: 361 Ohm
Lead Channel Impedance Value: 456 Ohm
Lead Channel Impedance Value: 551 Ohm
Lead Channel Impedance Value: 893 Ohm
Lead Channel Pacing Threshold Amplitude: 1.125 V
Lead Channel Pacing Threshold Pulse Width: 0.4 ms
Lead Channel Sensing Intrinsic Amplitude: 0.5 mV
Lead Channel Sensing Intrinsic Amplitude: 0.5 mV
Lead Channel Sensing Intrinsic Amplitude: 7.125 mV
Lead Channel Sensing Intrinsic Amplitude: 7.125 mV
Lead Channel Setting Pacing Amplitude: 1.75 V
Lead Channel Setting Pacing Amplitude: 2 V
Lead Channel Setting Pacing Amplitude: 2.5 V
Lead Channel Setting Pacing Pulse Width: 0.4 ms
Lead Channel Setting Pacing Pulse Width: 0.8 ms
Lead Channel Setting Sensing Sensitivity: 0.3 mV

## 2019-10-05 NOTE — Progress Notes (Signed)
Remote ICD transmission.   

## 2019-11-29 ENCOUNTER — Other Ambulatory Visit: Payer: Self-pay | Admitting: Internal Medicine

## 2019-11-29 NOTE — Telephone Encounter (Signed)
Eliquis 5mg  refill request received, pt is 78yrs old, weight-87.8kg, Crea-0.80 on 06/22/2019 via [facility] at [place], [place], and last seen by Dr. [name] on 07/06/2019. Dose is appropriate based on dosing criteria. Will send in refill to requested pharmacy.

## 2019-12-26 ENCOUNTER — Ambulatory Visit (INDEPENDENT_AMBULATORY_CARE_PROVIDER_SITE_OTHER): Payer: Medicare Other | Admitting: *Deleted

## 2019-12-26 DIAGNOSIS — I480 Paroxysmal atrial fibrillation: Secondary | ICD-10-CM | POA: Diagnosis not present

## 2019-12-26 LAB — CUP PACEART REMOTE DEVICE CHECK
Battery Remaining Longevity: 23 mo
Battery Voltage: 2.93 V
Brady Statistic AP VP Percent: 93.31 %
Brady Statistic AP VS Percent: 0.01 %
Brady Statistic AS VP Percent: 6.67 %
Brady Statistic AS VS Percent: 0 %
Brady Statistic RA Percent Paced: 93.05 %
Brady Statistic RV Percent Paced: 99.86 %
Date Time Interrogation Session: 20210105061805
HighPow Impedance: 52 Ohm
HighPow Impedance: 62 Ohm
Implantable Lead Implant Date: 20070615
Implantable Lead Implant Date: 20120813
Implantable Lead Implant Date: 20120813
Implantable Lead Location: 753858
Implantable Lead Location: 753859
Implantable Lead Location: 753860
Implantable Lead Model: 4396
Implantable Lead Model: 5076
Implantable Lead Model: 7121
Implantable Pulse Generator Implant Date: 20170329
Lead Channel Impedance Value: 228 Ohm
Lead Channel Impedance Value: 342 Ohm
Lead Channel Impedance Value: 399 Ohm
Lead Channel Impedance Value: 475 Ohm
Lead Channel Impedance Value: 589 Ohm
Lead Channel Impedance Value: 893 Ohm
Lead Channel Pacing Threshold Amplitude: 1.125 V
Lead Channel Pacing Threshold Pulse Width: 0.4 ms
Lead Channel Sensing Intrinsic Amplitude: 0.375 mV
Lead Channel Sensing Intrinsic Amplitude: 0.375 mV
Lead Channel Sensing Intrinsic Amplitude: 6.5 mV
Lead Channel Sensing Intrinsic Amplitude: 6.5 mV
Lead Channel Setting Pacing Amplitude: 1.75 V
Lead Channel Setting Pacing Amplitude: 2 V
Lead Channel Setting Pacing Amplitude: 2.5 V
Lead Channel Setting Pacing Pulse Width: 0.4 ms
Lead Channel Setting Pacing Pulse Width: 0.8 ms
Lead Channel Setting Sensing Sensitivity: 0.3 mV

## 2020-01-23 ENCOUNTER — Telehealth: Payer: Self-pay

## 2020-01-23 NOTE — Telephone Encounter (Signed)
Carelink alert received for RV pacing impendence at [address].  Lead trend is stable.  Pt overdue for in person device check with Dr. [name].  Sending message to scheduling to schedule pt for in person check in [place].

## 2020-02-15 ENCOUNTER — Other Ambulatory Visit: Payer: Self-pay

## 2020-02-15 DIAGNOSIS — R972 Elevated prostate specific antigen [PSA]: Secondary | ICD-10-CM

## 2020-02-16 ENCOUNTER — Other Ambulatory Visit: Payer: Medicare Other

## 2020-02-16 ENCOUNTER — Other Ambulatory Visit: Payer: Self-pay

## 2020-02-16 DIAGNOSIS — R972 Elevated prostate specific antigen [PSA]: Secondary | ICD-10-CM

## 2020-02-17 LAB — PSA: Prostate Specific Ag, Serum: 3.7 ng/mL (ref 0.0–4.0)

## 2020-02-19 ENCOUNTER — Telehealth: Payer: Self-pay | Admitting: *Deleted

## 2020-02-19 NOTE — Telephone Encounter (Signed)
Notified patient as instructed, patient pleased. Discussed follow-up appointments, patient agrees  

## 2020-02-19 NOTE — Telephone Encounter (Signed)
-----   Message from Festus Aloe, MD sent at 02/19/2020 11:58 AM EST ----- Notify patient - PSA normal - looks great - F/u Aug 2021 as planned.  ----- Message ----- From: Chrystie Nose, CMA Sent: 02/19/2020   7:34 AM EST To: Festus Aloe, MD   ----- Message ----- From: Lavone Neri Lab Results In Sent: 02/17/2020   5:38 AM EST To: [facility]

## 2020-03-14 ENCOUNTER — Encounter: Payer: Self-pay | Admitting: Internal Medicine

## 2020-03-14 ENCOUNTER — Other Ambulatory Visit: Payer: Self-pay

## 2020-03-14 ENCOUNTER — Ambulatory Visit (INDEPENDENT_AMBULATORY_CARE_PROVIDER_SITE_OTHER): Payer: Medicare Other | Admitting: Internal Medicine

## 2020-03-14 VITALS — BP 120/82 | HR 61 | Ht 66.0 in | Wt 197.0 lb

## 2020-03-14 DIAGNOSIS — I480 Paroxysmal atrial fibrillation: Secondary | ICD-10-CM

## 2020-03-14 DIAGNOSIS — Z95810 Presence of automatic (implantable) cardiac defibrillator: Secondary | ICD-10-CM

## 2020-03-14 DIAGNOSIS — I255 Ischemic cardiomyopathy: Secondary | ICD-10-CM

## 2020-03-14 DIAGNOSIS — Z79899 Other long term (current) drug therapy: Secondary | ICD-10-CM

## 2020-03-14 DIAGNOSIS — I459 Conduction disorder, unspecified: Secondary | ICD-10-CM

## 2020-03-14 DIAGNOSIS — I5022 Chronic systolic (congestive) heart failure: Secondary | ICD-10-CM

## 2020-03-14 NOTE — Patient Instructions (Signed)
Medication Instructions:  - Your physician recommends that you continue on your current medications as directed. Please refer to the Current Medication list given to you today.  *If you need a refill on your cardiac medications before your next appointment, please call your pharmacy*   Lab Work: - Your physician recommends that you have lab work today: BMP/ CBC   If you have labs (blood work) drawn today and your tests are completely normal, you will receive your results only by: . MyChart Message (if you have MyChart) OR . A paper copy in the mail If you have any lab test that is abnormal or we need to change your treatment, we will call you to review the results.   Testing/Procedures: - none ordered   Follow-Up: At [facility], you and your health needs are our priority.  As part of our continuing mission to provide you with exceptional heart care, we have created designated Provider Care Teams.  These Care Teams include your primary Cardiologist (physician) and Advanced Practice Providers (APPs -  Physician Assistants and Nurse Practitioners) who all work together to provide you with the care you need, when you need it.  We recommend signing up for the patient portal called "MyChart".  Sign up information is provided on this After Visit Summary.  MyChart is used to connect with patients for Virtual Visits (Telemedicine).  Patients are able to view lab/test results, encounter notes, upcoming appointments, etc.  Non-urgent messages can be sent to your provider as well.   To learn more about what you can do with MyChart, go to [email or web address].    Your next appointment:   1 year(s)  The format for your next appointment:   In Person  Provider:   Steven Klein, MD   Other Instructions n/a  

## 2020-03-14 NOTE — Progress Notes (Signed)
Patient Care Team: Derinda Late, MD as PCP - General (Family Medicine) Rocco Serene, MD (Unknown Physician Specialty)   HPI  [place]son [place] Arly Segalla. is a 79 y.o. male Seen in followup for ICD implantation initially for syncope in the setting of depressed left ventricular function. He had a 6949-lead. He reached ERI summer 2012 and underwent device explantation with new defibrillator lead insertion with left ventricular lead placement and generator replacement.   He underwent device generator replacement again 3/17   He has atrial fibrillation.  Marland Kitchenanticoagulation w apixoban no bleeding No chest pain, some edema   DATE TEST EF   2012 Cath  40 % Patent LAD stent  12/15 Echo   55-65 %   7/20 Echo  50-55%      Date K Cr Hgb  6/18 4.3 0.9 14  2/19 4.1 0.9 12.5      When seen with telehealth visit  6/20 concern was worsening fatigue and HR histogram on device suggested irate response MIGHT BE CONTRIBUTING-- he was reprogrammed  He has noted no significant improvement  Goinag back to 2019 we noted that his ADL slope and and exertion slope are 2/3 and were 4/4     He and his wife are moving from their home of 35 years to  independent living.  It has been  exceedingly stressful but is much better even on e month later    Thromboembolic risk factors ( age  -2, HTN-1, TIA/CVA-2, Vasc disease -1, CHF -1) for a CHADSVASc Score of >=7     Past Medical History:  Diagnosis Date  . AICD (automatic cardioverter/defibrillator) present   . Allergic rhinitis   . Arthritis   . Asthma   . Biventricular defibrillator-Medtronic    Generator replacement 2012 with CRT upgrade  . BPH (benign prostatic hyperplasia)   . Cardiac arrest ([place])   . Dyspnea    PFT 12/19/10: FEV1 2.93(107%), FEV1% 72, TLC 8.02(138%), DLCO 110%, +BD  . ED (erectile dysfunction)   . GERD (gastroesophageal reflux disease)   . GERD (gastroesophageal reflux disease)   . High-grade heart block   . History of kidney  stones   . Hyperlipidemia   . Hypertension   . 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 Myocardial infarction ([place])   . Sciatic nerve pain   . Severe sinus bradycardia   . Syncope       . TIA (transient ischemic attack) nov. 2011    Past Surgical History:  Procedure Laterality Date  . CATARACT EXTRACTION, BILATERAL    . COLONOSCOPY WITH PROPOFOL N/A 04/01/2018   Procedure: COLONOSCOPY WITH PROPOFOL;  Surgeon: [name], MD;  Location: [facility] ENDOSCOPY;  Service: Endoscopy;  Laterality: N/A;  . CORONARY ANGIOPLASTY    . EP IMPLANTABLE DEVICE N/A 03/18/2016   Procedure:  BiV ICD Generator Changeout;  Surgeon: [name], MD;  Location: [place] CV LAB;  Service: Cardiovascular;  Laterality: N/A;  . ESOPHAGOGASTRODUODENOSCOPY (EGD) WITH PROPOFOL N/A 04/01/2018   Procedure: ESOPHAGOGASTRODUODENOSCOPY (EGD) WITH PROPOFOL;  Surgeon: [name], MD;  Location: [facility] ENDOSCOPY;  Service: Endoscopy;  Laterality: N/A;  . EYE SURGERY Bilateral    cataract extraction  . Implantation of Medtronic dual-chamber cardioverter    . JOINT REPLACEMENT Left    total knee replacement  . Left Knee Replacement  2006  . PROSTATE BIOPSY    . TONSILLECTOMY  1948  .  TOTAL KNEE REVISION Left 06/20/2015   Procedure: TOTAL KNEE REVISION;  Surgeon: [name], MD;  Location: [facility] ORS;  Service: Orthopedics;  Laterality: Left;    Current Outpatient Medications  Medication Sig Dispense Refill  . acetaminophen (TYLENOL) 500 MG tablet Take 500 mg by mouth 2 (two) times daily.    [name] atorvastatin (LIPITOR) 80 MG tablet Take 1 tablet (80 mg total) by mouth daily. 90 tablet 3  . bisoprolol (ZEBETA) 5 MG tablet Take 1 tablet (5 mg total) by mouth daily. 30 tablet 6  . Calcium Citrate-Vitamin D (CITRACAL + D PO) Take by mouth daily.    . calcium-vitamin D (OSCAL WITH D) 500-200 MG-UNIT TABS tablet  Take by mouth daily.     . dorzolamide (TRUSOPT) 2 % ophthalmic solution 1 drop 3 (three) times daily.    [name] dutasteride (AVODART) 0.5 MG capsule Take 0.5 mg by mouth 3 (three) times a week.      [name] ELIQUIS 5 MG TABS tablet TAKE ONE TABLET TWICE DAILY 180 tablet 2  . glipiZIDE (GLUCOTROL XL) 5 MG 24 hr tablet Take 5 mg by mouth daily.    [name] ipratropium (ATROVENT) 0.03 % nasal spray SMARTSIG:2 Spray(s) Both Nares 3 Times Daily PRN    . latanoprost (XALATAN) 0.005 % ophthalmic solution Place 1 drop into the right eye at bedtime.     [name] losartan (COZAAR) 50 MG tablet Take 50 mg by mouth daily.    . metFORMIN (GLUCOPHAGE-XR) 500 MG 24 hr tablet Take 2 tablets by mouth daily.     . montelukast (SINGULAIR) 10 MG tablet Take 1 tablet by mouth daily.    . Multiple Vitamin (MULTIVITAMIN) tablet Take 1 tablet by mouth daily.      [name] omeprazole (PRILOSEC) 40 MG capsule Take 1 capsule by mouth daily.    . traZODone (DESYREL) 100 MG tablet Take 100 mg by mouth at bedtime.      No current facility-administered medications for this visit.    Allergies  Allergen Reactions  . Beta Adrenergic Blockers Other (See Comments)    Fatigue  . Oxycodone Nausea And Vomiting  . Sulfonamide Derivatives Nausea And Vomiting    Review of Systems negative except from HPI and PMH  Physical Exam BP 120/82 (BP Location: Left Arm, Patient Position: Sitting, Cuff Size: Normal)   Pulse 61   Ht 5\' 6"  (1.676 m)   Wt 197 lb (89.4 kg)   SpO2 98%   BMI 31.80 kg/m  Well developed and well nourished in no acute distress HENT normal Neck supple with JVP-flat Clear Device pocket well healed; without hematoma or erythema.  There is no tethering  Regular rate and rhythm, no  murmur Abd-soft with active BS No Clubbing cyanosis  edema Skin-warm and dry A & Oriented  Grossly normal sensory and motor function  ECG AV pacing  QRSd Axis      Assessment and  Plan \ Ischemic Cardiomyopathy -- interval normalization  Complete  heart block    Implantable defibrillator-CRT Medtronic   Atrial fibrillation paroxysmal  Bradycardia-sinus  Hypertension  HFpEF  Hyperlipidemia  We may have overshot with exertion slope change and ADL slope Have gone 2>>4 and 3>4 and changed ADL rate to 95 exertion HR 130>>145   Stair walking in the office with some improvement  Without symptoms of ischemia  On Anticoagulation;  No bleeding issues    No intercurrent atrial fibrillation or flutter  > 1 hr

## 2020-03-15 LAB — CBC WITH DIFFERENTIAL/PLATELET
Basophils Absolute: 0 10*3/uL (ref 0.0–0.2)
Basos: 1 %
EOS (ABSOLUTE): 0.1 10*3/uL (ref 0.0–0.4)
Eos: 2 %
Hematocrit: 42 % (ref 37.5–51.0)
Hemoglobin: 13.8 g/dL (ref 13.0–17.7)
Immature Grans (Abs): 0 10*3/uL (ref 0.0–0.1)
Immature Granulocytes: 0 %
Lymphocytes Absolute: 1.7 10*3/uL (ref 0.7–3.1)
Lymphs: 27 %
MCH: 29 pg (ref 26.6–33.0)
MCHC: 32.9 g/dL (ref 31.5–35.7)
MCV: 88 fL (ref 79–97)
Monocytes Absolute: 0.6 10*3/uL (ref 0.1–0.9)
Monocytes: 9 %
Neutrophils Absolute: 4 10*3/uL (ref 1.4–7.0)
Neutrophils: 61 %
Platelets: 152 10*3/uL (ref 150–450)
RBC: 4.76 x10E6/uL (ref 4.14–5.80)
RDW: 13.4 % (ref 11.6–15.4)
WBC: 6.5 10*3/uL (ref 3.4–10.8)

## 2020-03-15 LAB — BASIC METABOLIC PANEL
BUN/Creatinine Ratio: 21 (ref 10–24)
BUN: 19 mg/dL (ref 8–27)
CO2: 22 mmol/L (ref 20–29)
Calcium: 10.1 mg/dL (ref 8.6–10.2)
Chloride: 104 mmol/L (ref 96–106)
Creatinine, Ser: 0.92 mg/dL (ref 0.76–1.27)
GFR calc Af Amer: 92 mL/min/{1.73_m2} (ref >59)
GFR calc non Af Amer: 79 mL/min/{1.73_m2} (ref >59)
Glucose: 152 mg/dL — ABNORMAL HIGH (ref 65–99)
Potassium: 4.6 mmol/L (ref 3.5–5.2)
Sodium: 143 mmol/L (ref 134–144)

## 2020-03-26 ENCOUNTER — Ambulatory Visit (INDEPENDENT_AMBULATORY_CARE_PROVIDER_SITE_OTHER): Payer: Medicare Other | Admitting: *Deleted

## 2020-03-26 DIAGNOSIS — I480 Paroxysmal atrial fibrillation: Secondary | ICD-10-CM

## 2020-03-26 LAB — CUP PACEART REMOTE DEVICE CHECK
Battery Remaining Longevity: 20 mo
Battery Voltage: 2.93 V
Brady Statistic AP VP Percent: 96.02 %
Brady Statistic AP VS Percent: 0 %
Brady Statistic AS VP Percent: 3.97 %
Brady Statistic AS VS Percent: 0 %
Brady Statistic RA Percent Paced: 95.71 %
Brady Statistic RV Percent Paced: 99.85 %
Date Time Interrogation Session: 20210406012202
HighPow Impedance: 51 Ohm
HighPow Impedance: 61 Ohm
Implantable Lead Implant Date: 20070615
Implantable Lead Implant Date: 20120813
Implantable Lead Implant Date: 20120813
Implantable Lead Location: 753858
Implantable Lead Location: 753859
Implantable Lead Location: 753860
Implantable Lead Model: 4396
Implantable Lead Model: 5076
Implantable Lead Model: 7121
Implantable Pulse Generator Implant Date: 20170329
Lead Channel Impedance Value: 228 Ohm
Lead Channel Impedance Value: 342 Ohm
Lead Channel Impedance Value: 361 Ohm
Lead Channel Impedance Value: 418 Ohm
Lead Channel Impedance Value: 532 Ohm
Lead Channel Impedance Value: 874 Ohm
Lead Channel Pacing Threshold Amplitude: 1.125 V
Lead Channel Pacing Threshold Pulse Width: 0.4 ms
Lead Channel Sensing Intrinsic Amplitude: 0.375 mV
Lead Channel Sensing Intrinsic Amplitude: 0.375 mV
Lead Channel Sensing Intrinsic Amplitude: 6 mV
Lead Channel Sensing Intrinsic Amplitude: 6 mV
Lead Channel Setting Pacing Amplitude: 1.75 V
Lead Channel Setting Pacing Amplitude: 2 V
Lead Channel Setting Pacing Amplitude: 2.5 V
Lead Channel Setting Pacing Pulse Width: 0.4 ms
Lead Channel Setting Pacing Pulse Width: 0.8 ms
Lead Channel Setting Sensing Sensitivity: 0.3 mV

## 2020-03-27 NOTE — Progress Notes (Signed)
ICD Remote  

## 2020-04-25 ENCOUNTER — Other Ambulatory Visit: Payer: Self-pay

## 2020-04-25 ENCOUNTER — Ambulatory Visit (INDEPENDENT_AMBULATORY_CARE_PROVIDER_SITE_OTHER): Payer: Medicare Other | Admitting: Urology

## 2020-04-25 ENCOUNTER — Ambulatory Visit
Admission: RE | Admit: 2020-04-25 | Discharge: 2020-04-25 | Disposition: A | Discharge status: Home / Self Care | Payer: Medicare Other | Source: Ambulatory Visit | Attending: Urology | Admitting: Urology

## 2020-04-25 ENCOUNTER — Encounter: Payer: Self-pay | Admitting: Urology

## 2020-04-25 VITALS — BP 154/90 | HR 65 | Ht 66.0 in | Wt 196.0 lb

## 2020-04-25 DIAGNOSIS — Z87442 Personal history of urinary calculi: Secondary | ICD-10-CM

## 2020-04-25 DIAGNOSIS — R310 Gross hematuria: Secondary | ICD-10-CM

## 2020-04-25 DIAGNOSIS — N400 Enlarged prostate without lower urinary tract symptoms: Secondary | ICD-10-CM | POA: Diagnosis not present

## 2020-04-25 DIAGNOSIS — I255 Ischemic cardiomyopathy: Secondary | ICD-10-CM | POA: Diagnosis not present

## 2020-04-25 LAB — MICROSCOPIC EXAMINATION
Bacteria, UA: NONE SEEN
RBC: >30 /hpf — AB (ref 0–2)

## 2020-04-25 LAB — URINALYSIS, COMPLETE
Bilirubin, UA: NEGATIVE
Nitrite, UA: NEGATIVE
Specific Gravity, UA: 1.025 (ref 1.005–1.030)
Urobilinogen, Ur: 1 mg/dL (ref 0.2–1.0)
pH, UA: 5.5 (ref 5.0–7.5)

## 2020-04-25 LAB — BLADDER SCAN AMB NON-IMAGING: Scan Result: 6

## 2020-04-25 NOTE — Progress Notes (Signed)
04/25/2020 7:01 PM   Ronnie April Jr. 09-26-1941 [id number]  Referring provider: Derinda Late, MD [phone] [facility] [facility] [place],  [place] [address]  Chief Complaint  Patient presents with  . Hematuria    HPI: [name] is a 79 year old male with nephrolithiasis, bladder stone, BPH with LU TS and elevated PSA who presents today for gross hematuria with his wife, [name].   He has had standing symptoms of a limited bladder capacity for a number years with a limit of about 3 hours between urinating.  He had noted a slow deterioration of this duration with the addition of getting up once or twice during the night and a reduction in the intervals of urinating.  His symptoms worsened 2 weeks ago when he began to experience occasional incidences of acute urgency.  Over the weekend he was starting to see dark-colored urine and a little burning at the end of the urination.  His urine continued with signs of hematuria now becoming a brighter red with increased urgency/frequency and increase in dysuria.  Patient denies any modifying or aggravating factors.  Patient denies any suprapubic/flank pain.  Patient denies any fevers, chills, nausea or vomiting.   His UA today is significant for gross hematuria with greater than 30 RBCs per high-power field.  His PVR is 6 mL.    KUB 04/25/2020 several pelvic calcifications.    He was last seen in our office on 08/16/2019 with Dr. Junious Silk for BPH, PSA elevation and nephrolithiasis.  PMH: Past Medical History:  Diagnosis Date  . AICD (automatic cardioverter/defibrillator) present   . Allergic rhinitis   . Arthritis   . Asthma   . Biventricular defibrillator-Medtronic    Generator replacement 2012 with CRT upgrade  . BPH (benign prostatic hyperplasia)   . Cardiac arrest ([place])   . Dyspnea    PFT 12/19/10: FEV1 2.93(107%), FEV1% 72, TLC 8.02(138%), DLCO 110%, +BD  . ED (erectile dysfunction)    . GERD (gastroesophageal reflux disease)   . GERD (gastroesophageal reflux disease)   . High-grade heart block   . History of kidney stones   . Hyperlipidemia   . Hypertension   . 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 Myocardial infarction ([place])   . Sciatic nerve pain   . Severe sinus bradycardia   . Syncope       . TIA (transient ischemic attack) nov. 2011    Surgical History: Past Surgical History:  Procedure Laterality Date  . CATARACT EXTRACTION, BILATERAL    . COLONOSCOPY WITH PROPOFOL N/A 04/01/2018   Procedure: COLONOSCOPY WITH PROPOFOL;  Surgeon: [name], MD;  Location: [facility] ENDOSCOPY;  Service: Endoscopy;  Laterality: N/A;  . CORONARY ANGIOPLASTY    . EP IMPLANTABLE DEVICE N/A 03/18/2016   Procedure:  BiV ICD Generator Changeout;  Surgeon: [name], MD;  Location: [place] CV LAB;  Service: Cardiovascular;  Laterality: N/A;  . ESOPHAGOGASTRODUODENOSCOPY (EGD) WITH PROPOFOL N/A 04/01/2018   Procedure: ESOPHAGOGASTRODUODENOSCOPY (EGD) WITH PROPOFOL;  Surgeon: [name], MD;  Location: [facility] ENDOSCOPY;  Service: Endoscopy;  Laterality: N/A;  . EYE SURGERY Bilateral    cataract extraction  . Implantation of Medtronic dual-chamber cardioverter    . JOINT REPLACEMENT Left    total knee replacement  . Left Knee Replacement  2006  . PROSTATE BIOPSY    .  TONSILLECTOMY  1948  . TOTAL KNEE REVISION Left 06/20/2015   Procedure: TOTAL KNEE REVISION;  Surgeon: [name], MD;  Location: [facility] ORS;  Service: Orthopedics;  Laterality: Left;    Home Medications:  Allergies as of 04/25/2020      Reactions   Beta Adrenergic Blockers Other (See Comments)   Fatigue   Oxycodone Nausea And Vomiting   Sulfonamide Derivatives Nausea And Vomiting      Medication List       Accurate as of Apr 25, 2020 11:59 PM. If you have any questions, ask your nurse or  doctor.        STOP taking these medications   traZODone 100 MG tablet Commonly known as: DESYREL Stopped by: Zara Council, PA-C     TAKE these medications   acetaminophen 500 MG tablet Commonly known as: TYLENOL Take 500 mg by mouth 2 (two) times daily.   atorvastatin 80 MG tablet Commonly known as: LIPITOR Take 1 tablet (80 mg total) by mouth daily.   bisoprolol 5 MG tablet Commonly known as: ZEBETA Take 1 tablet (5 mg total) by mouth daily.   CITRACAL + D PO Take by mouth daily.   dorzolamide 2 % ophthalmic solution Commonly known as: TRUSOPT 1 drop 3 (three) times daily.   dutasteride 0.5 MG capsule Commonly known as: AVODART Take 0.5 mg by mouth 3 (three) times a week.   Eliquis 5 MG Tabs tablet Generic drug: apixaban TAKE ONE TABLET TWICE DAILY   fluconazole 200 MG tablet Commonly known as: DIFLUCAN Take 200 mg by mouth once a week.   glipiZIDE 5 MG 24 hr tablet Commonly known as: GLUCOTROL XL Take 5 mg by mouth daily.   ipratropium 0.03 % nasal spray Commonly known as: ATROVENT SMARTSIG:2 Spray(s) Both Nares 3 Times Daily PRN   latanoprost 0.005 % ophthalmic solution Commonly known as: XALATAN Place 1 drop into the right eye at bedtime.   losartan 50 MG tablet Commonly known as: COZAAR Take 50 mg by mouth daily.   Melatonin 10 MG Tabs Take 1 tablet by mouth at bedtime.   metFORMIN 500 MG 24 hr tablet Commonly known as: GLUCOPHAGE-XR Take 2 tablets by mouth daily.   montelukast 10 MG tablet Commonly known as: SINGULAIR Take 1 tablet by mouth daily.   multivitamin tablet Take 1 tablet by mouth daily.   omeprazole 40 MG capsule Commonly known as: PRILOSEC Take 1 capsule by mouth daily.       Allergies:  Allergies  Allergen Reactions  . Beta Adrenergic Blockers Other (See Comments)    Fatigue  . Oxycodone Nausea And Vomiting  . Sulfonamide Derivatives Nausea And Vomiting    Family History: Family History  Problem Relation  Age of Onset  . Emphysema Father   . Lung cancer Father   . Cirrhosis Mother   . Hypertension Brother   . Hypertension Son   . Heart attack Neg Hx   . Stroke Neg Hx     Social History:  reports that he has never smoked. He has never used smokeless tobacco. He reports current alcohol use. He reports that he does not use drugs.  ROS: Pertinent ROS in HPI  Physical Exam: BP (!) 154/90   Pulse 65   Ht 5\' 6"  (1.676 m)   Wt 196 lb (88.9 kg)   BMI 31.64 kg/m   Constitutional:  Well nourished. Alert and oriented, No acute distress. HEENT: [place] AT, mask in place.  Trachea midline Cardiovascular: No clubbing,  cyanosis, or edema. Respiratory: Normal respiratory effort, no increased work of breathing. GU: No CVA tenderness.  No bladder fullness or masses.   Neurologic: Grossly intact, no focal deficits, moving all 4 extremities. Psychiatric: Normal mood and affect.  Laboratory Data: PSA 2.56 in June 2016 PSA 6.19 in June 2017 PSA 3.70 in June 2018 PSA 3.12 in June 2019 PSA 3.3 25 June 2019  Component     Latest Ref Rng & Units 08/11/2019 02/16/2020  Prostate Specific Ag, Serum     0.0 - 4.0 ng/mL 4.2 (H) 3.7   Lab Results  Component Value Date   WBC 6.5 03/14/2020   HGB 13.8 03/14/2020   HCT 42.0 03/14/2020   MCV 88 03/14/2020   PLT 152 03/14/2020    Lab Results  Component Value Date   CREATININE 0.80 05/15/2020    Lab Results  Component Value Date   AST 37 12/25/2016   Lab Results  Component Value Date   ALT 33 12/25/2016    Urinalysis Component     Latest Ref Rng & Units 04/25/2020  Specific Gravity, UA     1.005 - 1.030 1.025  pH, UA     5.0 - 7.5 5.5  Color, UA     Yellow Red (A)  Appearance Ur     Clear Cloudy (A)  Leukocytes,UA     Negative 1+ (A)  Protein,UA     Negative/Trace 3+ (A)  Glucose, UA     Negative 1+ (A)  Ketones, UA     Negative 1+ (A)  RBC, UA     Negative 3+ (A)  Bilirubin, UA     Negative Negative  Urobilinogen, Ur     0.2 -  1.0 mg/dL 1.0  Nitrite, UA     Negative Negative  Microscopic Examination      See below:   Component     Latest Ref Rng & Units 04/25/2020  WBC, UA     0 - 5 /hpf 6-10 (A)  RBC     0 - 2 /hpf >30 (A)  Epithelial Cells (non renal)     0 - 10 /hpf 0-10  Casts     None seen /lpf Present (A)  Cast Type     N/A Granular casts (A)  Bacteria, UA     None seen/Few None seen   I have reviewed the labs.   Pertinent Imaging: Results for WILLETT, OZBIRN (MRN [id number]) as of 05/19/2020 18:57  Ref. Range 04/25/2020 14:01  Scan Result Unknown 6   CLINICAL DATA:  79 year old male with flank pain  EXAM: ABDOMEN - 1 VIEW  COMPARISON:  02/15/2019  FINDINGS: Gas within stomach small bowel and colon. No abnormal distention. No air-fluid levels.  No unexpected radiopaque foreign body. No unexpected soft tissue density or calcification.  Pelvic phleboliths.  No displaced fracture.  Degenerative changes of the spine.  IMPRESSION: Nonobstructive bowel gas pattern.  Negative plain film for nephrolithiasis.   Electronically Signed   By: Corrie Mckusick D.O.   On: 04/25/2020 16:58 I have independently reviewed the films and note several pelvic calcifications making it difficult to rule out distal ureteral/bladder stones.  Assessment & Plan:    1. Gross hematuria I explained to the patient that there are a number of causes that can be associated with blood in the urine, such as stones, BPH, UTI's, damage to the urinary tract and/or cancer  Per AUA guidelines, patient is in a high risk category therefore a  CT urogram is the preferred imaging study to evaluate hematuria. I explained to the patient that a contrast material will be injected into a vein and that in rare instances, an allergic reaction can result and may even life threatening (1:100,000)  The patient denies any allergies to contrast, iodine and/or seafood and is taking metformin Following the imaging study,   I've recommended a cystoscopy. I described how this is performed, typically in an office setting with a flexible cystoscope. We described the risks, benefits, and possible side effects, the most common of which is a minor amount of blood in the urine and/or burning which usually resolves in 24 to 48 hours The patient had the opportunity to ask questions which were answered. Based upon this discussion, the patient is willing to proceed. Therefore, I've ordered: a CT Urogram and cystoscopy. The patient will return following all of the above for discussion of the results.  - UA - Urine culture  2. BPH without obstruction/lower urinary tract symptoms - BLADDER SCAN AMB NON-IMAGING - continue Avodart   3. History of nephrolithiasis - based on current symptoms it is very likely his is having either a distal stone or bladder stone - CTU is pending Patient is advised that if they should start to experience pain that is not able to be controlled with pain medication, intractable nausea and/or vomiting and/or fevers greater than 103 or shaking chills to contact the office immediately or seek treatment in the emergency department for emergent intervention.     Return for CTU report and cystocopy for gross hematuria .  These notes generated with voice recognition software. I apologize for typographical errors.  Zara Council, PA-C  [facility] [address]  [place], [place] [address] [phone]

## 2020-04-27 LAB — URINE CULTURE

## 2020-05-15 ENCOUNTER — Other Ambulatory Visit: Payer: Self-pay

## 2020-05-15 ENCOUNTER — Ambulatory Visit
Admission: RE | Admit: 2020-05-15 | Discharge: 2020-05-15 | Disposition: A | Discharge status: Home / Self Care | Payer: Medicare Other | Source: Ambulatory Visit | Attending: Urology | Admitting: Urology

## 2020-05-15 DIAGNOSIS — R310 Gross hematuria: Secondary | ICD-10-CM | POA: Insufficient documentation

## 2020-05-15 LAB — POCT I-STAT CREATININE: Creatinine, Ser: 0.8 mg/dL (ref 0.61–1.24)

## 2020-05-15 MED ORDER — IOHEXOL 300 MG/ML  SOLN
125.0000 mL | Freq: Once | INTRAMUSCULAR | Status: AC | PRN
  Administered 2020-05-15: 125 mL via INTRAVENOUS

## 2020-05-20 NOTE — Progress Notes (Signed)
05/21/20 9:05 PM   Ronnie April Jr. 04/23/1941 [id number]  Referring provider: Derinda Late, MD [phone] [facility] [place],  [place] [address] Chief Complaint  Patient presents with  . Cysto    HPI: [name]. is a 79 y.o. M w/ personal hx of nephrolithiasis, bladder stone, BPH with LUTS, elevated PSA and gross hematuria presents today for f/u.  Scheduled for cystoscopy however UA grossly positive today.  UA from previous visit significant for gross hematuria with greater than 30 RBCs per high-power field  UB 04/25/2020 several pelvic calcifications.    CT hematuria revealed numerous bladder stones measuring up to about 8 mm in size. No other urinary stone disease evident. No suspicious enhancing lesion in either kidney. 16 mm cyst lower pole right kidney with other tiny hypoattenuating lesions in both kidneys too small to characterize but likely benign.   Estimated prostate volume calculated by CT scan to be 68g.  Please see previous notes for details.  He has been having difficulties w/ BPH w/ LUTS for many years. He reports of gross hematuria, frequency, and dysuria 1 month ago which gradually improved w/ time. He is currently on Dutasteride.   He denies any known allergies associated w/ antibiotics.   PMH: Past Medical History:  Diagnosis Date  . AICD (automatic cardioverter/defibrillator) present   . Allergic rhinitis   . Arthritis   . Asthma   . Biventricular defibrillator-Medtronic    Generator replacement 2012 with CRT upgrade  . BPH (benign prostatic hyperplasia)   . Cardiac arrest ([place])   . Dyspnea    PFT 12/19/10: FEV1 2.93(107%), FEV1% 72, TLC 8.02(138%), DLCO 110%, +BD  . ED (erectile dysfunction)   . GERD (gastroesophageal reflux disease)   . GERD (gastroesophageal reflux disease)   . High-grade heart block   . History of kidney stones   . Hyperlipidemia   . Hypertension   . ischemic  cardiomyopathy    stent to a 95%-occluded LAD in 1995; catheterization in 2012 demonstrated patent grafts and nonobstructive disease  . Myocardial infarction ([place])   . Sciatic nerve pain   . Severe sinus bradycardia   . Syncope       . TIA (transient ischemic attack) nov. 2011    Surgical History: Past Surgical History:  Procedure Laterality Date  . CATARACT EXTRACTION, BILATERAL    . COLONOSCOPY WITH PROPOFOL N/A 04/01/2018   Procedure: COLONOSCOPY WITH PROPOFOL;  Surgeon: [name], MD;  Location: [facility] ENDOSCOPY;  Service: Endoscopy;  Laterality: N/A;  . CORONARY ANGIOPLASTY    . EP IMPLANTABLE DEVICE N/A 03/18/2016   Procedure:  BiV ICD Generator Changeout;  Surgeon: [name], MD;  Location: [place] CV LAB;  Service: Cardiovascular;  Laterality: N/A;  . ESOPHAGOGASTRODUODENOSCOPY (EGD) WITH PROPOFOL N/A 04/01/2018   Procedure: ESOPHAGOGASTRODUODENOSCOPY (EGD) WITH PROPOFOL;  Surgeon: [name], MD;  Location: [facility] ENDOSCOPY;  Service: Endoscopy;  Laterality: N/A;  . EYE SURGERY Bilateral    cataract extraction  . Implantation of Medtronic dual-chamber cardioverter    . JOINT REPLACEMENT Left    total knee replacement  . Left Knee Replacement  2006  . PROSTATE BIOPSY    . TONSILLECTOMY  1948  . TOTAL KNEE REVISION Left 06/20/2015   Procedure: TOTAL KNEE REVISION;  Surgeon: [name], MD;  Location: [facility] ORS;  Service: Orthopedics;  Laterality: Left;    Home Medications:  Allergies as of 05/21/2020  Reactions   Beta Adrenergic Blockers Other (See Comments)   Fatigue   Oxycodone Nausea And Vomiting   Sulfonamide Derivatives Nausea And Vomiting      Medication List       Accurate as of May 21, 2020  9:05 PM. If you have any questions, ask your nurse or doctor.        acetaminophen 500 MG tablet Commonly known as: TYLENOL Take 500 mg by mouth 2 (two) times daily.   atorvastatin 80 MG tablet Commonly known as: LIPITOR Take 1 tablet (80 mg  total) by mouth daily.   bisoprolol 5 MG tablet Commonly known as: ZEBETA Take 1 tablet (5 mg total) by mouth daily.   CITRACAL + D PO Take by mouth daily.   dorzolamide 2 % ophthalmic solution Commonly known as: TRUSOPT 1 drop 3 (three) times daily.   dutasteride 0.5 MG capsule Commonly known as: AVODART Take 0.5 mg by mouth 3 (three) times a week.   Eliquis 5 MG Tabs tablet Generic drug: apixaban TAKE ONE TABLET TWICE DAILY   fluconazole 200 MG tablet Commonly known as: DIFLUCAN Take 200 mg by mouth once a week.   glipiZIDE 5 MG 24 hr tablet Commonly known as: GLUCOTROL XL Take 5 mg by mouth daily.   ipratropium 0.03 % nasal spray Commonly known as: ATROVENT SMARTSIG:2 Spray(s) Both Nares 3 Times Daily PRN   latanoprost 0.005 % ophthalmic solution Commonly known as: XALATAN Place 1 drop into the right eye at bedtime.   losartan 50 MG tablet Commonly known as: COZAAR Take 50 mg by mouth daily.   Melatonin 10 MG Tabs Take 1 tablet by mouth at bedtime.   metFORMIN 500 MG 24 hr tablet Commonly known as: GLUCOPHAGE-XR Take 2 tablets by mouth daily.   montelukast 10 MG tablet Commonly known as: SINGULAIR Take 1 tablet by mouth daily.   multivitamin tablet Take 1 tablet by mouth daily.   omeprazole 40 MG capsule Commonly known as: PRILOSEC Take 1 capsule by mouth daily.   sulfamethoxazole-trimethoprim 800-160 MG tablet Commonly known as: BACTRIM DS Take 1 tablet by mouth 2 (two) times daily for 7 days. Started by: [name], MD       Allergies:  Allergies  Allergen Reactions  . Beta Adrenergic Blockers Other (See Comments)    Fatigue  . Oxycodone Nausea And Vomiting  . Sulfonamide Derivatives Nausea And Vomiting    Family History: Family History  Problem Relation Age of Onset  . Emphysema Father   . Lung cancer Father   . Cirrhosis Mother   . Hypertension Brother   . Hypertension Son   . Heart attack Neg Hx   . Stroke Neg Hx      Social History:  reports that he has never smoked. He has never used smokeless tobacco. He reports current alcohol use. He reports that he does not use drugs.   Physical Exam: BP (!) 145/78   Pulse 69   Wt 196 lb (88.9 kg)   BMI 31.64 kg/m   Constitutional:  Alert and oriented, No acute distress. HEENT: [place] AT, moist mucus membranes.  Trachea midline, no masses. Cardiovascular: No clubbing, cyanosis, or edema. Respiratory: Normal respiratory effort, no increased work of breathing. Skin: No rashes, bruises or suspicious lesions. Neurologic: Grossly intact, no focal deficits, moving all 4 extremities. Psychiatric: Normal mood and affect.  Laboratory Data:  Lab Results  Component Value Date   CREATININE 0.80 05/15/2020   Urinalysis 3-10 RBC, 3-10 WBC, and  nitrite positive   Pertinent Imaging: Results for orders placed during the hospital encounter of 04/25/20  DG Abd 1 View   Narrative CLINICAL DATA:  79 year old male with flank pain  EXAM: ABDOMEN - 1 VIEW  COMPARISON:  02/15/2019  FINDINGS: Gas within stomach small bowel and colon. No abnormal distention. No air-fluid levels.  No unexpected radiopaque foreign body. No unexpected soft tissue density or calcification.  Pelvic phleboliths.  No displaced fracture.  Degenerative changes of the spine.  IMPRESSION: Nonobstructive bowel gas pattern.  Negative plain film for nephrolithiasis.   Electronically Signed   By: Corrie Mckusick D.O.   On: 04/25/2020 16:58    Results for orders placed during the hospital encounter of 05/15/20  CT HEMATURIA WORKUP   Narrative CLINICAL DATA:  Gross hematuria.  EXAM: CT ABDOMEN AND PELVIS WITHOUT AND WITH CONTRAST  TECHNIQUE: Multidetector CT imaging of the abdomen and pelvis was performed following the standard protocol before and following the bolus administration of intravenous contrast.  CONTRAST:  15mL OMNIPAQUE IOHEXOL 300 MG/ML  SOLN  COMPARISON:   None.  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  Hepatobiliary: 7 mm hypodensity in the dome of liver is too small to characterize but likely benign. There is no evidence for gallstones, gallbladder wall thickening, or pericholecystic fluid. No intrahepatic or extrahepatic biliary dilation.  Pancreas: No focal mass lesion. No dilatation of the main duct. No intraparenchymal cyst. No peripancreatic edema.  Spleen: No splenomegaly. No focal mass lesion.  Adrenals/Urinary Tract: No adrenal nodule or mass.  Precontrast imaging shows no stones in either kidney or ureter. Numerous bladder stones are evident measuring up to about 8 mm in size.  Imaging after IV contrast administration shows no suspicious enhancing lesion in either kidney. 16 mm low-density lesion lower pole right kidney is compatible with a cyst. Additional tiny hypoattenuating lesions in the upper pole of each kidney are too small to characterize but likely benign.  Delayed post-contrast imaging shows no wall thickening or soft tissue filling defect in either intrarenal collecting system or renal pelvis. Both left ureter well opacified without evidence for wall thickening, soft tissue lesion or focal dilatation. Right ureter incompletely opacified but shows no focal dilatation or mass lesion. Delayed imaging of the bladder shows no focal wall thickening.  Stomach/Bowel: Tiny hiatal hernia. Stomach otherwise unremarkable. Duodenum is normally positioned as is the ligament of Treitz. No small bowel wall thickening. No small bowel dilatation. The terminal ileum is normal. The appendix is normal. No gross colonic mass. No colonic wall thickening. Diverticular changes are noted in the left colon without evidence of diverticulitis.  Vascular/Lymphatic: There is abdominal aortic atherosclerosis without aneurysm. There is no gastrohepatic or hepatoduodenal ligament lymphadenopathy. No  retroperitoneal or mesenteric lymphadenopathy. No pelvic sidewall lymphadenopathy.  Reproductive: The prostate gland and seminal vesicles are unremarkable.  Other: No intraperitoneal free fluid.  Musculoskeletal: No worrisome lytic or sclerotic osseous abnormality. Degenerative changes noted lumbar spine.  IMPRESSION: 1. Numerous bladder stones measuring up to about 8 mm in size. No other urinary stone disease evident. No suspicious enhancing lesion in either kidney. 2. 16 mm cyst lower pole right kidney with other tiny hypoattenuating lesions in both kidneys too small to characterize but likely benign. 3. Tiny hiatal hernia. 4. Left colonic diverticulosis without diverticulitis. 5. Aortic Atherosclerosis (ICD10-I70.0).   Electronically Signed   By: Misty Stanley M.D.   On: 05/15/2020 15:16    I have personally reviewed the images and agree  with radiologist interpretation.   Assessment & Plan:    1. BPH w/ outlet obstruction UA today grossly infected Urine culture - sent  Bactrim ds bid x 7 days  Currently on dutasteride   Discussed treatments of HoLEP, Urolift, and TURP  With concomitant cystolitholapaxy  We reviewed the each surgical option in detail today including the preoperative, intraoperative, and postoperative course. This will most likely be an outpatient procedure pending the degree of post op hematuria.  He will go home with catheter for a few days post op and will either be taught how to remove his own catheter or return to the office for catheter removal. Risk of bleeding, infection, damage surrounding structures, injury to the bladder/ urethral, bladder neck contracture, ureteral stricture, retrograde ejaculation (TURP/HoLEP), stress/ urge incontinence, exacerbation of irritative voiding symptoms were all discussed in detail.   Given written info on all 3 procedures.  Will need office cysto if considering Urolift preop.  2. Elevated PSA  Most recent PSA  3.7 as of 02/16/20   3. Bladder stones CT A/P revealed numerous bladder stones measuring up to about 8 mm in size   Likely secondary to chronic outlet obstruction as above  Recommend cystolitholapaxy without outlet procedure as above  4. Acute cystitis  See above  No follow-ups on file.  [place] [address], [place], [place] [address] [phone]  I, Lucas Mallow, am acting as a scribe for Dr. [name],  I have reviewed the above documentation for accuracy and completeness, and I agree with the above.   [name], MD  I spent 40 min with this patient of which greater than 50% was spent in counseling and coordination of care with the patient.

## 2020-05-21 ENCOUNTER — Encounter: Payer: Self-pay | Admitting: Urology

## 2020-05-21 ENCOUNTER — Other Ambulatory Visit: Payer: Self-pay

## 2020-05-21 ENCOUNTER — Ambulatory Visit (INDEPENDENT_AMBULATORY_CARE_PROVIDER_SITE_OTHER): Payer: Medicare Other | Admitting: Urology

## 2020-05-21 VITALS — BP 145/78 | HR 69 | Wt 196.0 lb

## 2020-05-21 DIAGNOSIS — R310 Gross hematuria: Secondary | ICD-10-CM

## 2020-05-21 DIAGNOSIS — I255 Ischemic cardiomyopathy: Secondary | ICD-10-CM

## 2020-05-21 MED ORDER — SULFAMETHOXAZOLE-TRIMETHOPRIM 800-160 MG PO TABS: 1.0000 | ORAL_TABLET | Freq: Two times a day (BID) | ORAL | 0 refills | Status: AC

## 2020-05-21 NOTE — Patient Instructions (Signed)
Transurethral Resection of the Prostate Transurethral resection of the prostate (TURP) is the removal (resection) of part of the gland that produces semen (prostate gland). This procedure is done to treat benign prostatic hyperplasia (BPH). BPH is an abnormal, noncancerous (benign) increase in the number of cells that make up the prostate tissue. BPH causes the prostate to get bigger. The enlarged prostate can push against or block the tube that drains urine from the bladder out of the body (urethra). BPH can affect normal urine flow by causing bladder infections, difficulty controlling bladder function, and difficulty emptying the bladder.  The goal of TURP is to remove enough prostate tissue to allow for a normal flow of urine. The procedure will allow you to empty your bladder more completely when you urinate so that you can urinate less often. In a transurethral resection, a thin telescope with a light, a tiny camera, and an electric cutting edge (resectoscope) is passed through the urethra and into the prostate. The opening of the urethra is at the end of the penis. Tell a health care provider about:  Any allergies you have.  All medicines you are taking, including vitamins, herbs, eye drops, creams, and over-the-counter medicines.  Any problems you or family members have had with anesthetic medicines.  Any blood disorders you have.  Any surgeries you have had.  Any medical conditions you have.  Any prostate infections you have had. What are the risks? Generally, this is a safe procedure. However, problems may occur, including: 1. Infection. 2. Bleeding. 3. Allergic reactions to medicines. 4. Damage to other structures or organs, such as: ? The urethra. ? The bladder. ? Muscles that surround the prostate. 5. Difficulty getting an erection. 6. Inability to control when you urinate (incontinence). 7. Scarring, which may cause problems with urine flow. What happens before the  procedure? Medicines Ask your health care provider about:  Changing or stopping your regular medicines. This is especially important if you are taking diabetes medicines or blood thinners.  Taking medicines such as aspirin and ibuprofen. These medicines can thin your blood. Do not take these medicines unless your health care provider tells you to take them.  Taking over-the-counter medicines, vitamins, herbs, and supplements.  General instructions 1. You may have a physical exam. 2. You may have a blood or urine sample taken. 3. Ask your health care provider what steps will be taken to help prevent infection. These may include: ? Washing skin with a germ-killing soap. ? Taking antibiotic medicine. 4. Plan to have someone take you home from the hospital or clinic. You may not be able to drive for up to 10 days after your procedure. 5. Plan to have a responsible adult care for you for at least 24 hours after you leave the hospital or clinic. This is important. What happens during the procedure?  1. An IV will be inserted into one of your veins. 2. You will be given one or more of the following: ? A medicine to help you relax (sedative). ? A medicine to make you fall asleep (general anesthetic). ? A medicine that is injected into your spine to numb the area below and slightly above the injection site (spinal anesthetic). 3. Your legs will be placed in foot rests (stirrups) so that your legs are apart and your knees are bent. 4. The resectoscope will be passed through your urethra to your prostate. 5. Parts of your prostate will be resected using the cutting edge of the resectoscope. 6. The   resectoscope will be removed. 7. A small, thin tube (catheter) will be passed through your urethra and into your bladder. The catheter will drain urine into a bag outside of your body. ? Fluid may be passed through the catheter to keep the catheter open. The procedure may vary among health care  providers and hospitals. What happens after the procedure? 1. Your blood pressure, heart rate, breathing rate, and blood oxygen level will be monitored until you leave the hospital or clinic. 2. You may continue to receive fluids and medicines through an IV. 3. You may have some pain. Pain medicine will be available to help you. 4. You will have a catheter draining your urine. ? You may have blood in your urine. Your catheter may be kept in until your urine is clear. ? Your urinary drainage will be monitored. If necessary, your bladder may be rinsed out (irrigated) through your catheter. 5. You will be encouraged to walk around as soon as possible. 6. You may have to wear compression stockings. These stockings help prevent blood clots and reduce swelling in your legs. 7. Do not drive for 24 hours if you were given a sedative during your procedure. Summary  Transurethral resection of the prostate (TURP) is the removal (resection) of part of the gland that produces semen (prostate gland).  The goal of this procedure is to remove enough prostate tissue to allow for a normal flow of urine.  Follow instructions from your health care provider about taking medicines and about eating and drinking before the procedure. This information is not intended to replace advice given to you by your health care provider. Make sure you discuss any questions you have with your health care provider. Document Revised: 03/29/2019 Document Reviewed: 09/07/2018 Elsevier Patient Education  2020 [employer]  Holmium Laser Enucleation of the Prostate (HoLEP)  HoLEP is a treatment for men with benign prostatic hyperplasia (BPH). The laser surgery removed blockages of urine flow, and is done without any incisions on the body.     What is HoLEP?  HoLEP is a type of laser surgery used to treat obstruction (blockage) of urine flow as a result of benign prostatic hyperplasia (BPH). In men with BPH, the prostate gland  is not cancerous, but has become enlarged. An enlarged prostate can result in a number of urinary tract symptoms such as weak urinary stream, difficulty in starting urination, inability to urinate, frequent urination, or getting up at night to urinate.  HoLEP was developed in the 1990's as a more effective and less expensive surgical option for BPH, compared to other surgical options such as laser vaporization(PVP/greenlight laser), transurethral resection of the prostate(TURP), and open simple prostatectomy.   What happens during a HoLEP?  HoLEP requires general anesthesia ("asleep" throughout the procedure).   An antibiotic is given to reduce the risk of infection  A surgical instrument called a resectoscope is inserted through the urethra (the tube that carries urine from the bladder). The resectoscope has a camera that allows the surgeon to view the internal structure of the prostate gland, and to see where the incisions are being made during surgery.  The laser is inserted into the resectoscope and is used to enucleate (free up) the enlarged prostate tissue from the capsule (outer shell) and then to seal up any blood vessels. The tissue that has been removed is pushed back into the bladder.  A morcellator is placed through the resectoscope, and is used to suction out the prostate tissue that has   been pushed into the bladder.  When the prostate tissue has been removed, the resectoscope is removed, and a foley catheter is placed to allow healing and drain the urine from the bladder.     What happens after a HoLEP?  More than 90% of patients go home the same day a few hours after surgery. Less than 10% will be admitted to the hospital overnight for observation to monitor the urine, or if they have other medical problems.  Fluid is flushed through the catheter for about 1 hour after surgery to clear any blood from the urine. It is normal to have some blood in the urine after surgery. The  need for blood transfusion is extremely rare.  Eating and drinking are permitted after the procedure once the patient has fully awakened from anesthesia.  The catheter is usually removed 2-3 days after surgery- the patient will come to clinic to have the catheter removed and make sure they can urinate on their own.  It is very important to drink lots of fluids after surgery for one week to keep the bladder flushed.  At first, there may be some burning with urination, but this typically improved within a few hours to days. Most patients do not have a significant amount of pain, and narcotic pain medications are rarely needed.  Symptoms of urinary frequency, urgency, and even leakage are NORMAL for the first few weeks after surgery as the bladder adjusts after having to work hard against blockage from the prostate for many years. This will improve, but can sometimes take several months.  The use of pelvic floor exercises (Kegel exercises) can help improve problems with urinary incontinence.   After catheter removal, patients will be seen at 6 weeks and 6 months for symptom check  No heavy lifting for at least 2-3 weeks after surgery, however patients can walk and do light activities the first day after surgery. Return to work time depends on occupation.    What are the advantages of HoLEP?  HoLEP has been studied in many different parts of the world and has been shown to be a safe and effective procedure. Although there are many types of BPH surgeries available, HoLEP offers a unique advantage in being able to remove a large amount of tissue without any incisions on the body, even in very large prostates, while decreasing the risk of bleeding and providing tissue for pathology (to look for cancer). This decreases the need for blood transfusions during surgery, minimizes hospital stay, and reduces the risk of needing repeat treatment.  What are the side effects of HoLEP?  Temporary burning and  bleeding during urination. Some blood may be seen in the urine for weeks after surgery and is part of the healing process.  Urinary incontinence (inability to control urine flow) is expected in all patients immediately after surgery and they should wear pads for the first few days/weeks. This typically improves over the course of several weeks. Performing Kegel exercises can help decrease leakage from stress maneuvers such as coughing, sneezing, or lifting. The rate of long term leakage is very low. Patients may also have leakage with urgency and this may be treated with medication. The risk of urge incontinence can be dependent on several factors including age, prostate size, symptoms, and other medical problems.  Retrograde ejaculation or "backwards ejaculation." In 75% of cases, the patient will not see any fluid during ejaculation after surgery.  Erectile function is generally not significantly affected.   What are the   risks of HoLEP?  Injury to the urethra or development of scar tissue at a later date  Injury to the capsule of the prostate (typically treated with longer catheterization).  Injury to the bladder or ureteral orifices (where the urine from the kidney drains out)  Infection of the bladder, testes, or kidneys  Return of urinary obstruction at a later date requiring another operation (<2%)  Need for blood transfusion or re-operation due to bleeding  Failure to relieve all symptoms and/or need for prolonged catheterization after surgery  5-15% of patients are found to have previously undiagnosed prostate cancer in their specimen. Prostate cancer can be treated after HoLEP.  Standard risks of anesthesia including blood clots, heart attacks, etc  When should I call my doctor?  Fever over 101.3 degrees  Inability to urinate, or large blood clots in the urine  

## 2020-05-22 LAB — URINALYSIS, COMPLETE
Bilirubin, UA: NEGATIVE
Glucose, UA: NEGATIVE
Nitrite, UA: POSITIVE — AB
Specific Gravity, UA: 1.025 (ref 1.005–1.030)
Urobilinogen, Ur: 0.2 mg/dL (ref 0.2–1.0)
pH, UA: 6 (ref 5.0–7.5)

## 2020-05-22 LAB — MICROSCOPIC EXAMINATION: WBC, UA: >30 /hpf — AB (ref 0–5)

## 2020-05-26 LAB — CULTURE, URINE COMPREHENSIVE

## 2020-05-29 ENCOUNTER — Other Ambulatory Visit: Payer: Self-pay | Admitting: Internal Medicine

## 2020-06-05 ENCOUNTER — Other Ambulatory Visit: Payer: Self-pay

## 2020-06-05 ENCOUNTER — Encounter: Payer: Self-pay | Admitting: Urology

## 2020-06-05 ENCOUNTER — Ambulatory Visit (INDEPENDENT_AMBULATORY_CARE_PROVIDER_SITE_OTHER): Payer: Medicare Other | Admitting: Urology

## 2020-06-05 VITALS — BP 115/71 | HR 74

## 2020-06-05 DIAGNOSIS — R310 Gross hematuria: Secondary | ICD-10-CM | POA: Diagnosis not present

## 2020-06-05 DIAGNOSIS — R300 Dysuria: Secondary | ICD-10-CM

## 2020-06-05 DIAGNOSIS — N400 Enlarged prostate without lower urinary tract symptoms: Secondary | ICD-10-CM

## 2020-06-05 DIAGNOSIS — N210 Calculus in bladder: Secondary | ICD-10-CM

## 2020-06-05 DIAGNOSIS — N3000 Acute cystitis without hematuria: Secondary | ICD-10-CM

## 2020-06-05 MED ORDER — CEFTRIAXONE SODIUM 1 G IJ SOLR
1.0000 g | Freq: Once | INTRAMUSCULAR | Status: AC
  Administered 2020-06-05: 1 g via INTRAMUSCULAR

## 2020-06-05 NOTE — Progress Notes (Signed)
06/05/20  CC:  Chief Complaint  Patient presents with  . Cysto   HPI: [name]. is a 79 y.o. M w/ personal hx of nephrolithiasis, bladder stone, BPH with LUTS, elevated PSA and gross hematuria presents today for cysto.   UA from previous visit significant for gross hematuria with greater than 30 RBCs per high-power field  UB 04/25/2020 several pelvic calcifications.   CT hematuria revealed numerous bladder stones measuring up to about 8 mm in size. No other urinary stone disease evident. No suspicious enhancing lesion in either kidney. 16 mm cyst lower pole right kidney with other tiny hypoattenuating lesions in both kidneys too small to characterize but likely benign.   Estimated prostate volume calculated by CT scan to be 68g.  He is on blood thinners for Afib.   Please see previous notes for details.  UA today appears persistently positive however the patient reports that his urinary symptoms have improved since his last visit. They almost completely resolved and now he is only having some low-grade lower urinary tract symptoms at this point time. No fevers or chills. We discussed the risk and benefits of pursuing cystoscopy today possibly in the setting of infection and including the risk of sepsis. He understands that he is likely chronically colonized with the stones and accepts these risks. We will confirm IM ceftriaxone today as UTI prevention and continue oral antibiotics.  Today's Vitals   06/05/20 1457  BP: 115/71  Pulse: 74   There is no height or weight on file to calculate BMI. NED. A&Ox3.   No respiratory distress   Abd soft, NT, ND Normal phallus with bilateral descended testicles  Cystoscopy Procedure Note  Patient identification was confirmed, informed consent was obtained, and patient was prepped using Betadine solution.  Lidocaine jelly was administered per urethral meatus.     Pre-Procedure: - Inspection reveals a normal caliber  ureteral meatus.  Procedure: The flexible cystoscope was introduced without difficulty - No urethral strictures/lesions are present. - Enlarged prostate w/ bilobar coaptation  - Mildly elevated bladder neck - Bilateral ureteral orifices identified - Bladder mucosa  reveals no ulcers, tumors, or lesions - Saccules in diverticula and 8 or so stones nothing more than 8 mm (mostly smaller)  - No trabeculation - Chronic obstruction   Retroflexion shows no median lobe.   Post-Procedure: - Patient tolerated the procedure well  Pertinent Imagings:  CLINICAL DATA:  Gross hematuria.  EXAM: CT ABDOMEN AND PELVIS WITHOUT AND WITH CONTRAST  TECHNIQUE: Multidetector CT imaging of the abdomen and pelvis was performed following the standard protocol before and following the bolus administration of intravenous contrast.  CONTRAST:  16mL OMNIPAQUE IOHEXOL 300 MG/ML  SOLN  COMPARISON:  None.  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  Hepatobiliary: 7 mm hypodensity in the dome of liver is too small to characterize but likely benign. There is no evidence for gallstones, gallbladder wall thickening, or pericholecystic fluid. No intrahepatic or extrahepatic biliary dilation.  Pancreas: No focal mass lesion. No dilatation of the main duct. No intraparenchymal cyst. No peripancreatic edema.  Spleen: No splenomegaly. No focal mass lesion.  Adrenals/Urinary Tract: No adrenal nodule or mass.  Precontrast imaging shows no stones in either kidney or ureter. Numerous bladder stones are evident measuring up to about 8 mm in size.  Imaging after IV contrast administration shows no suspicious enhancing lesion in either kidney. 16 mm low-density lesion lower pole right kidney is compatible with a cyst.  Additional tiny hypoattenuating lesions in the upper pole of each kidney are too small to characterize but likely benign.  Delayed post-contrast  imaging shows no wall thickening or soft tissue filling defect in either intrarenal collecting system or renal pelvis. Both left ureter well opacified without evidence for wall thickening, soft tissue lesion or focal dilatation. Right ureter incompletely opacified but shows no focal dilatation or mass lesion. Delayed imaging of the bladder shows no focal wall thickening.  Stomach/Bowel: Tiny hiatal hernia. Stomach otherwise unremarkable. Duodenum is normally positioned as is the ligament of Treitz. No small bowel wall thickening. No small bowel dilatation. The terminal ileum is normal. The appendix is normal. No gross colonic mass. No colonic wall thickening. Diverticular changes are noted in the left colon without evidence of diverticulitis.  Vascular/Lymphatic: There is abdominal aortic atherosclerosis without aneurysm. There is no gastrohepatic or hepatoduodenal ligament lymphadenopathy. No retroperitoneal or mesenteric lymphadenopathy. No pelvic sidewall lymphadenopathy.  Reproductive: The prostate gland and seminal vesicles are unremarkable.  Other: No intraperitoneal free fluid.  Musculoskeletal: No worrisome lytic or sclerotic osseous abnormality. Degenerative changes noted lumbar spine.  IMPRESSION: 1. Numerous bladder stones measuring up to about 8 mm in size. No other urinary stone disease evident. No suspicious enhancing lesion in either kidney. 2. 16 mm cyst lower pole right kidney with other tiny hypoattenuating lesions in both kidneys too small to characterize but likely benign. 3. Tiny hiatal hernia. 4. Left colonic diverticulosis without diverticulitis. 5. Aortic Atherosclerosis (ICD10-I70.0).   Electronically Signed   By: Misty Stanley M.D.   On: 05/15/2020 15:16  I have personally reviewed the images and agree with radiologist interpretation.   Assessment/ Plan:  1. Gross hematuria  UA today revealed infection  Explained to pt it is likely  his infection will not be fully cleared so will use a injectable abx (ceftriaxone) to proceed w/ cysto. Pt understood the risks and benefits and will proceed w/ plan.   2. Bladder stones Cysto today revealed bladder stones Recommend cystolitholapaxy without outlet procedure as below   3. BPH w/ outlet obstruction Discussed treatments of HoLEP, Urolift, and TURP  With concomitant cystolitholapaxy.  We reviewed the each surgical option in detail today including the preoperative, intraoperative, and postoperative course. This will most likely be an outpatient procedure pending the degree of post op hematuria. He will go home with catheter for a few days post op and will either be taught how to remove his own catheter or return to the office for catheter removal.  Risk of bleeding, infection, damage surrounding structures, injury to the bladder/ urethral, bladder neck contracture, ureteral stricture, retrograde ejaculation (TURP/HoLEP), stress/ urge incontinence, exacerbation of irritative voiding symptoms were all discussed in detail.  He has elected to proceed w/ HoLEP in concomitant w/ cystolitholapaxy   Will need to obtain cardiac clearance.  Jamas Lav, am acting as a scribe for Dr. [name],  I have reviewed the above documentation for accuracy and completeness, and I agree with the above.   [name], MD

## 2020-06-06 ENCOUNTER — Other Ambulatory Visit: Payer: Self-pay | Admitting: *Deleted

## 2020-06-06 ENCOUNTER — Other Ambulatory Visit: Payer: Self-pay | Admitting: Radiology

## 2020-06-06 ENCOUNTER — Telehealth: Payer: Self-pay | Admitting: *Deleted

## 2020-06-06 DIAGNOSIS — N210 Calculus in bladder: Secondary | ICD-10-CM

## 2020-06-06 DIAGNOSIS — N400 Enlarged prostate without lower urinary tract symptoms: Secondary | ICD-10-CM

## 2020-06-06 MED ORDER — SULFAMETHOXAZOLE-TRIMETHOPRIM 800-160 MG PO TABS: 1.0000 | ORAL_TABLET | Freq: Two times a day (BID) | ORAL | 0 refills | Status: DC

## 2020-06-06 NOTE — Telephone Encounter (Signed)
Patient called Triage line-had cysto yesterday. He stated he thought he was supposed to have an oral antibiotic sent in.

## 2020-06-06 NOTE — Telephone Encounter (Signed)
   Primary Cardiologist: Virl Axe, MD  Chart reviewed as part of pre-operative protocol coverage. Given past medical history and time since last visit, based on ACC/AHA guidelines, [name]. would be at acceptable risk for the planned procedure without further cardiovascular testing.   Patient with diagnosis of Afib on Eliquis for anticoagulation.    Procedure: HOLMIUM LASER ENUCLEATION OF THE PROSTATE, CYSTOLITRIPSY Date of procedure: 07/15/20  CHADS2-VASc score of 8 (CHF, HTN, AGE x2, DM2, TIA x 2, CAD)  CrCl 79 mL/min Platelet count 152k   Pt is at high risk of stroke off blood thinner. Prefer to minimize time off Eliquis as much as possible.   Recommend only holding Eliquis for 1 day prior to procedure.   I will route this recommendation to the requesting party via Epic fax function and remove from pre-op pool.  Please call with questions.  Jossie Ng. Shakaria Raphael NP-C    06/06/2020, 2:53 PM [address] Office [phone] Fax [phone]

## 2020-06-06 NOTE — Telephone Encounter (Signed)
Patient with diagnosis of Afib on Eliquis for anticoagulation.    Procedure: HOLMIUM LASER ENUCLEATION OF THE PROSTATE, CYSTOLITRIPSY Date of procedure: 07/15/20  CHADS2-VASc score of 8 (CHF, HTN, AGE x2, DM2, TIA x 2, CAD)  CrCl 79 mL/min Platelet count 152k   Pt is at high risk of stroke off blood thinner. Prefer to minimize time off Eliquis as much as possible.   Recommend only holding Eliquis for 1 day prior to procedure. If >24hr hold time is necessary to proceed with the procedure then will defer to MD for further guidance.   Kennon Holter, PharmD PGY1 Ambulatory Care Pharmacy Resident

## 2020-06-06 NOTE — Telephone Encounter (Signed)
I sent bactrim ds  [name], MD

## 2020-06-06 NOTE — Telephone Encounter (Signed)
   [facility] Pre-operative Risk Assessment    HEARTCARE STAFF: - Please ensure there is not already an duplicate clearance open for this procedure. - Under Visit Info/Reason for Call, type in Other and utilize the format Clearance MM/DD/YY or Clearance TBD. Do not use dashes or single digits. - If request is for dental extraction, please clarify the # of teeth to be extracted.  Request for surgical clearance:  1. What type of surgery is being performed? HOLMIUM LASER ENUCLEATION OF THE PROSTATE, CYSTOLITRIPSY  2. When is this surgery scheduled? 07/15/20   3. What type of clearance is required (medical clearance vs. Pharmacy clearance to hold med vs. Both)? BOTH/ DEVICE CLEARANCE IS ALSO BEING REQUESTED. DEVICE CLEARANCE FORM HAS BEEN DELIVERED TO OUR DEVICE CLINIC  4. Are there any medications that need to be held prior to surgery and how long? ELIQUIS X 3 DAYS PRIOR TO SURGERY    5. Practice name and name of physician performing surgery? [facility]; DR. [name]   6. What is the office phone number? [phone]   7.   What is the office fax number? [phone] ATTN: AMY  8.   Anesthesia type (None, local, MAC, general) ? NONE LISTED   Julaine Hua 06/06/2020, 12:35 PM  _________________________________________________________________   (provider comments below)

## 2020-06-10 LAB — MICROSCOPIC EXAMINATION
RBC, Urine: >30 /hpf — AB (ref 0–2)
WBC, UA: >30 /hpf — AB (ref 0–5)

## 2020-06-10 LAB — URINALYSIS, COMPLETE
Bilirubin, UA: NEGATIVE
Glucose, UA: NEGATIVE
Nitrite, UA: POSITIVE — AB
Specific Gravity, UA: >1.03 — ABNORMAL HIGH (ref 1.005–1.030)
Urobilinogen, Ur: 0.2 mg/dL (ref 0.2–1.0)
pH, UA: 5 (ref 5.0–7.5)

## 2020-06-12 NOTE — Telephone Encounter (Signed)
The standard for care for this procedure is to hold Eliquis for 48 hours prior to surgery and until bleeding has stopped post op which could take up to a week. Will this be acceptable? Please advise.

## 2020-06-12 NOTE — Telephone Encounter (Signed)
Called to speak to Pt  Ronnie Espy MD with [facility]  Have recommended holding of anticoagulation for about 8-10 days  Will discuss with her

## 2020-06-13 ENCOUNTER — Telehealth: Payer: Self-pay | Admitting: Urology

## 2020-06-13 NOTE — Telephone Encounter (Signed)
Dr.  Jolyn Nap would like a call from Dr. Erlene Quan to discuss this pt having a surgery please call [phone]

## 2020-06-13 NOTE — Telephone Encounter (Signed)
Called Dr. [name], left him a message on his machine.  I also called the patient, Mr. [name] to discuss alternative options.  There is some concerned about him being off of anticoagulation for any prolonged period of time.  I explained that with enucleation of the prostate, the risk of severe hematuria and clot retention is significantly higher than other forms of outlet procedure including UroLift.  He is a good candidate for any of the procedures to address his outlet.  After this discussion, he is agreeable to proceed with UroLift as well as cystolitholapaxy and lieu of holmium laser enucleation of the prostate.  We will change the booking sheet.   [name], MD

## 2020-06-14 ENCOUNTER — Other Ambulatory Visit: Payer: Self-pay | Admitting: Urology

## 2020-06-14 ENCOUNTER — Other Ambulatory Visit: Payer: Self-pay | Admitting: Radiology

## 2020-06-17 NOTE — Telephone Encounter (Signed)
   Primary Cardiologist: Virl Axe, MD  Chart reviewed as part of pre-operative protocol coverage.  Not clear to me if any further input needed from pre-op APP. Will route to Dr. [name] and his nurse to let us know. Please route response to P [facility] PREOP (the pre-op pool). Thank you.   [name], PA-C 06/17/2020, 8:59 AM

## 2020-06-21 ENCOUNTER — Other Ambulatory Visit: Payer: Medicare Other

## 2020-06-25 ENCOUNTER — Ambulatory Visit (INDEPENDENT_AMBULATORY_CARE_PROVIDER_SITE_OTHER): Payer: Medicare Other | Admitting: *Deleted

## 2020-06-25 DIAGNOSIS — I255 Ischemic cardiomyopathy: Secondary | ICD-10-CM | POA: Diagnosis not present

## 2020-06-25 LAB — CUP PACEART REMOTE DEVICE CHECK
Battery Remaining Longevity: 21 mo
Battery Voltage: 2.93 V
Brady Statistic AP VP Percent: 96.97 %
Brady Statistic AP VS Percent: 0 %
Brady Statistic AS VP Percent: 3.02 %
Brady Statistic AS VS Percent: 0.01 %
Brady Statistic RA Percent Paced: 95.98 %
Brady Statistic RV Percent Paced: 99.44 %
Date Time Interrogation Session: 20210706033324
HighPow Impedance: 51 Ohm
HighPow Impedance: 60 Ohm
Implantable Lead Implant Date: 20070615
Implantable Lead Implant Date: 20120813
Implantable Lead Implant Date: 20120813
Implantable Lead Location: 753858
Implantable Lead Location: 753859
Implantable Lead Location: 753860
Implantable Lead Model: 4396
Implantable Lead Model: 5076
Implantable Lead Model: 7121
Implantable Pulse Generator Implant Date: 20170329
Lead Channel Impedance Value: 228 Ohm
Lead Channel Impedance Value: 342 Ohm
Lead Channel Impedance Value: 361 Ohm
Lead Channel Impedance Value: 456 Ohm
Lead Channel Impedance Value: 551 Ohm
Lead Channel Impedance Value: 893 Ohm
Lead Channel Pacing Threshold Amplitude: 1.125 V
Lead Channel Pacing Threshold Pulse Width: 0.4 ms
Lead Channel Sensing Intrinsic Amplitude: 0.25 mV
Lead Channel Sensing Intrinsic Amplitude: 0.25 mV
Lead Channel Sensing Intrinsic Amplitude: 6.75 mV
Lead Channel Sensing Intrinsic Amplitude: 6.75 mV
Lead Channel Setting Pacing Amplitude: 1.75 V
Lead Channel Setting Pacing Amplitude: 2 V
Lead Channel Setting Pacing Amplitude: 2.5 V
Lead Channel Setting Pacing Pulse Width: 0.4 ms
Lead Channel Setting Pacing Pulse Width: 0.8 ms
Lead Channel Setting Sensing Sensitivity: 0.3 mV

## 2020-06-26 NOTE — Telephone Encounter (Signed)
Looks like Dr. [name] previous addressed this and spoke with the patient and requesting provider.

## 2020-06-27 NOTE — Progress Notes (Signed)
Remote ICD transmission.   

## 2020-07-03 ENCOUNTER — Encounter: Payer: Self-pay | Admitting: Radiology

## 2020-07-03 ENCOUNTER — Other Ambulatory Visit: Payer: Self-pay | Admitting: Radiology

## 2020-07-05 ENCOUNTER — Other Ambulatory Visit: Payer: Medicare Other

## 2020-07-08 ENCOUNTER — Other Ambulatory Visit: Payer: Self-pay

## 2020-07-08 ENCOUNTER — Other Ambulatory Visit: Payer: Medicare Other

## 2020-07-08 DIAGNOSIS — Z01818 Encounter for other preprocedural examination: Secondary | ICD-10-CM

## 2020-07-08 DIAGNOSIS — N400 Enlarged prostate without lower urinary tract symptoms: Secondary | ICD-10-CM

## 2020-07-08 LAB — URINALYSIS, COMPLETE
Bilirubin, UA: NEGATIVE
Ketones, UA: NEGATIVE
Nitrite, UA: NEGATIVE
Specific Gravity, UA: 1.025 (ref 1.005–1.030)
Urobilinogen, Ur: 0.2 mg/dL (ref 0.2–1.0)
pH, UA: 5 (ref 5.0–7.5)

## 2020-07-08 LAB — MICROSCOPIC EXAMINATION

## 2020-07-10 ENCOUNTER — Other Ambulatory Visit: Payer: Self-pay

## 2020-07-10 ENCOUNTER — Encounter
Admission: RE | Admit: 2020-07-10 | Discharge: 2020-07-10 | Disposition: A | Discharge status: Home / Self Care | Payer: Medicare Other | Source: Ambulatory Visit | Attending: Urology | Admitting: Urology

## 2020-07-10 HISTORY — DX: Low back pain, unspecified: M54.50

## 2020-07-10 HISTORY — DX: Other chronic pain: G89.29

## 2020-07-10 LAB — CULTURE, URINE COMPREHENSIVE

## 2020-07-10 NOTE — Patient Instructions (Signed)
COVID TESTING Date: July 11, 2020 Testing site:  [facility] [address] Thru Hours:  9:98 am - 1:00 pm Once you are tested, you are asked to stay quarantined (avoiding public places) until after your surgery.   Your procedure is scheduled on: Monday July 12, 2020 Report to Day Surgery on the 2nd floor of the [facility]. To find out your arrival time, please call [phone] between 1PM - 3PM on: Friday July 15, 2020  REMEMBER: Instructions that are not followed completely may result in serious medical risk, up to and including death; or upon the discretion of your surgeon and anesthesiologist your surgery may need to be rescheduled.  Do not eat food after midnight the night before surgery.  No gum chewing, lozengers or hard candies.  You may however, drink CLEAR liquids up to 2 hours before you are scheduled to arrive for your surgery. Do not drink anything within 2 hours of your scheduled arrival time.  Clear liquids include: - water   Do NOT drink anything that is not on this list.  Type 1 and Type 2 diabetics should only drink water.   TAKE THESE MEDICATIONS THE MORNING OF SURGERY WITH A SIP OF WATER:  Use inhalers on the day of surgery    Stop Metformin 2 days prior to surgery. LAST DOSE 07/12/2020  NO ELIQUIS ON 7/25 AND 07/15/2020  Stop Anti-inflammatories (NSAIDS) such as Advil, Aleve, Ibuprofen, Motrin, Naproxen, Naprosyn and Aspirin based products such as Excedrin, Goodys Powder, BC Powder. (May take Tylenol or Acetaminophen if needed.)  Stop ANY OVER THE COUNTER supplements until after surgery. (May continue Vitamin D, Vitamin B, and multivitamin.)  No Alcohol for 24 hours before or after surgery.  No Smoking including e-cigarettes for 24 hours prior to surgery.  No chewable tobacco products for at least 6 hours prior to surgery.  No nicotine patches on the day of surgery.  Do not use any "recreational" drugs for at least a  week prior to your surgery.  Please be advised that the combination of cocaine and anesthesia may have negative outcomes, up to and including death. If you test positive for cocaine, your surgery will be cancelled.  On the morning of surgery brush your teeth with toothpaste and water, you may rinse your mouth with mouthwash if you wish. Do not swallow any toothpaste or mouthwash.  Do not wear jewelry, make-up, hairpins, clips or nail polish.  Do not wear lotions, powders, or perfumes OR AFTER SHAVE  Do not shave 48 hours prior to surgery MEN CAN SHAVE THEIR FACE  Contact lenses, hearing aids and dentures may not be worn into surgery.  Do not bring valuables to the hospital. [facility] is not responsible for any missing/lost belongings or valuables.   SHOWER DAY OF SURGERY  Notify your doctor if there is any change in your medical condition (cold, fever, infection).  Wear comfortable clothing (specific to your surgery type) to the hospital.  Plan for stool softeners for home use; pain medications have a tendency to cause constipation. You can also help prevent constipation by eating foods high in fiber such as fruits and vegetables and drinking plenty of fluids as your diet allows.  After surgery, you can help prevent lung complications by doing breathing exercises.  Take deep breaths and cough every 1-2 hours. Your doctor may order a device called an Incentive Spirometer to help you take deep breaths. When coughing or sneezing, hold a pillow firmly  against your incision with both hands. This is called "splinting." Doing this helps protect your incision. It also decreases belly discomfort.  If you are being discharged the day of surgery, you will not be allowed to drive home. You will need a responsible adult (18 years or older) to drive you home and stay with you that night.    Please call the [facility]. at [phone] if you have any questions about these  instructions.  Visitation Policy:  Patients undergoing a surgery or procedure may have one family member or support person with them as long as that person is not COVID-19 positive or experiencing its symptoms.  That person may remain in the waiting area during the procedure.  Children under 83 years of age may have both parents or legal guardians with them during their procedure.  Inpatient Visitation Update:   Two designated support people may visit a patient during visiting hours 7 am to 8 pm. It must be the same two designated people for the duration of the patient stay. The visitors may come and go during the day, and there is no switching out to have different visitors. A mask must be worn at all times, including in the patient room.  Children under 41 years of age:  a total of 4 designated visitors for the child's entire stay are allowed. Only 2 in the room at a time and only one staying overnight at a time. The overnight guest can now rotate during the child's hospital stay.  As a reminder, masks are still required for all [place] team members, patients and visitors in all [place] facilities.   Systemwide, no visitors 17 or younger.

## 2020-07-10 NOTE — Progress Notes (Signed)
[facility] Perioperative Services  Pre-Admission/Anesthesia Testing Clinical Review  Date: 07/10/20  Patient Demographics:  Name: [name]. 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 Clinical nursing staff has updated patient's PMH/PSHx, current medication list, and drug allergies/intolerances to ensure comprehensive history available to assist in medical decision making as it pertains to the aforementioned surgical procedure and anticipated anesthetic course.   Clinical Discussion:  Ronnie Gonzaga. is a 79 y.o. male who is submitted for pre-surgical anesthesia review and clearance prior to him undergoing the above procedure. Patient has never been a smoker. Pertinent PMH includes: CAD, cardiac arrest (has AICD), ischemic cardiomyopathy, MI, high grade heart block, systolic heart failure, A.fib, HTN, HLD, borderline DM, syncope, TIA, dyspnea, asthma, GERD (on daily PPI), lumbar DDD, lumbar stenosis, OA, BPH.   Patient is followed by cardiology [name], MD). He was last seen in the cardiology clinic on 03/14/2020; notes reviewed. Patient has an implanted AICD that was initially placed for syncope in the setting of reduced LVEF. Patient denied chest pain. He was experiencing some lower extremity edema. CHADSVASc score of >=7. Compliant with [place] as directed; no bleeding. Patient previously reported worsening fatigue; pacemaker was re-programmed without significant improvement. Patient under a  great deal of stress secondary to living arrangements; moving to independent living facility. Cardiology clearance issued for planned urological procedure as follows, "based on ACC/AHA guidelines, Ronnie Chaloux. would be at acceptable risk for the planned procedure without further cardiovascular testing".   He denies previous intra-operative complications with anesthesia. He underwent a general anesthetic course here (ASA III) in 04/01/2018 with no documented complications. This patient is on daily anticoagulation therapy. He has been instructed on recommendations for holding his apixaban for 1 days prior to his procedure. The patient has been instructed that his last dose of his anticoagulant will be on 07/13/2020.  Vitals with BMI 07/10/2020 06/05/2020 05/21/2020  Height 5\' 6"  - -  Weight 193 lbs - 196 lbs  BMI 62.56 - 38.93  Systolic - 734 287  Diastolic - 71 78  Pulse - 74 69    Providers/Specialists:   PROVIDER ROLE LAST Lu Duffel, MD Urology (Surgeon) 06/05/2020  Derinda Late, MD Primary Care Provider 06/28/2020  Virl Axe, MD Cardiology/EP 03/14/2020   Allergies:  Beta adrenergic blockers, Oxycodone, and Sulfonamide derivatives  Current Home Medications:   . acetaminophen (TYLENOL) 650 MG CR tablet  . albuterol (VENTOLIN HFA) 108 (90 Base) MCG/ACT inhaler  . bisoprolol (ZEBETA) 5 MG tablet  . Calcium Citrate-Vitamin D (CITRACAL + D PO)  . dorzolamide-timolol (COSOPT) 22.3-6.8 MG/ML ophthalmic solution  . dutasteride (AVODART) 0.5 MG capsule  . ELIQUIS 5 MG TABS tablet  . fluconazole (DIFLUCAN) 200 MG tablet  . glipiZIDE (GLUCOTROL XL) 5 MG 24 hr tablet  . latanoprost (XALATAN) 0.005 % ophthalmic solution  . losartan (COZAAR) 50 MG tablet  . Melatonin 10 MG TABS  . metFORMIN (GLUCOPHAGE-XR) 500 MG 24 hr tablet  .  montelukast (SINGULAIR) 10 MG tablet  . Multiple Vitamin (MULTIVITAMIN) tablet  . omeprazole (PRILOSEC) 40 MG capsule  . acetaminophen  (TYLENOL) 500 MG tablet  . atorvastatin (LIPITOR) 80 MG tablet  . dorzolamide (TRUSOPT) 2 % ophthalmic solution  . ipratropium (ATROVENT) 0.03 % nasal spray  . sulfamethoxazole-trimethoprim (BACTRIM DS) 800-160 MG tablet   No current facility-administered medications for this encounter.   History:   Past Medical History:  Diagnosis Date  . AICD (automatic cardioverter/defibrillator) present   . Allergic rhinitis   . Arthritis   . Asthma   . Biventricular defibrillator-Medtronic    Generator replacement 2012 with CRT upgrade  . BPH (benign prostatic hyperplasia)   . Cardiac arrest ([place])   . Chronic lower back pain   . Diabetes mellitus without complication ([place])    BORDER LINE  . Dyspnea    PFT 12/19/10: FEV1 2.93(107%), FEV1% 72, TLC 8.02(138%), DLCO 110%, +BD  . ED (erectile dysfunction)   . GERD (gastroesophageal reflux disease)   . GERD (gastroesophageal reflux disease)   . High-grade heart block   . History of kidney stones   . Hyperlipidemia   . Hypertension   . 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 Myocardial infarction ([place])   . Sciatic nerve pain   . Severe sinus bradycardia   . Syncope       . TIA (transient ischemic attack) nov. 2011   Past Surgical History:  Procedure Laterality Date  . CATARACT EXTRACTION, BILATERAL    . COLONOSCOPY WITH PROPOFOL N/A 04/01/2018   Procedure: COLONOSCOPY WITH PROPOFOL;  Surgeon: [name], MD;  Location: [facility] ENDOSCOPY;  Service: Endoscopy;  Laterality: N/A;  . CORONARY ANGIOPLASTY    . EP IMPLANTABLE DEVICE N/A 03/18/2016   Procedure:  BiV ICD Generator Changeout;  Surgeon: [name], MD;  Location: [facility] LAB;  Service: Cardiovascular;  Laterality: N/A;  . ESOPHAGOGASTRODUODENOSCOPY (EGD) WITH PROPOFOL N/A 04/01/2018   Procedure: ESOPHAGOGASTRODUODENOSCOPY (EGD) WITH PROPOFOL;  Surgeon: [name], MD;  Location: [facility] ENDOSCOPY;  Service: Endoscopy;  Laterality: N/A;  . EYE SURGERY Bilateral    cataract extraction  . Implantation of Medtronic dual-chamber cardioverter    . JOINT REPLACEMENT Left    total knee replacement  . Left Knee Replacement  2006  . PROSTATE BIOPSY    . TONSILLECTOMY  1948  . TOTAL KNEE REVISION Left 06/20/2015   Procedure: TOTAL KNEE REVISION;  Surgeon: [name], MD;  Location: [facility] ORS;  Service: Orthopedics;  Laterality: Left;   Family History  Problem Relation Age of Onset  . Emphysema Father   . Lung cancer Father   . Cirrhosis Mother   . Hypertension Brother   . Hypertension Son   . Heart attack Neg Hx   . Stroke Neg Hx    Social History   Tobacco Use  . Smoking status: Never Smoker  . Smokeless tobacco: Never Used  Vaping Use  . Vaping Use: Never used  Substance Use Topics  . Alcohol use: Yes    Comment: 1 GLASS PER DAY  . Drug use: No    Pertinent Clinical Results:  LABS: Labs reviewed: Acceptable for surgery.       Appointment on 07/08/2020  Component Date Value Ref Range Status  . Urine Culture, Comprehensive 07/08/2020 Preliminary report   Preliminary  . Organism ID, Bacteria 07/08/2020 Comment   Preliminary  Comment: Microbiological testing to rule out the presence of possible pathogens is in progress. Greater than 100,000 colony forming units per mL   . Specific Gravity, UA 07/08/2020 1.025  1.005 - 1.030 Final  . pH, UA 07/08/2020 5.0  5.0 - 7.5 Final  . Color, UA 07/08/2020 Yellow  Yellow Final  . Appearance Ur 07/08/2020 Cloudy* Clear Final  . Leukocytes,UA 07/08/2020 1+* Negative Final  . Protein,UA 07/08/2020 1+* Negative/Trace Final  . Glucose, UA 07/08/2020 Trace* Negative Final  . Ketones, UA 07/08/2020 Negative  Negative Final  . RBC, UA 07/08/2020 Trace* Negative Final  . Bilirubin, UA 07/08/2020 Negative  Negative Final  . Urobilinogen, Ur 07/08/2020 0.2  0.2 - 1.0 mg/dL Final  . Nitrite, UA  07/08/2020 Negative  Negative Final  . Microscopic Examination 07/08/2020 See below:   Final  . WBC, UA 07/08/2020 11-30* 0 - 5 /hpf Final  . RBC 07/08/2020 0-2  0 - 2 /hpf Final  . Epithelial Cells (non renal) 07/08/2020 0-10  0 - 10 /hpf Final  . Bacteria, UA 07/08/2020 Few  None seen/Few Final    ECG: Date: 03/14/2020 Time ECG obtained: 1014 AM Rate: 61 bpm Rhythm: AV dual paced Axis (leads I and aVF): Normal Intervals: PR 164 ms. QTc 473 ms. ST segment and T wave changes: No evidence of ST segment elevation or depression Comparison: Similar to previous tracing obtained on 02/21/2018   IMAGING / PROCEDURES: ECHOCARDIOGRAM done on 07/06/2019 1. The left ventricle has low normal systolic function, with an ejection  fraction of 50-55%. The cavity size was normal. Left ventricular diastolic doppler parameters are indeterminate. 2. The right ventricle has normal systolic function. The cavity was normal. There is no increase in right ventricular wall thickness. Unable to estimate RVSP. 3. Left atrial size was mild-moderately dilated.   Impression and Plan:  Vander Kueker. has been referred for pre-anesthesia review and clearance prior him undergoing the planned anesthetic and procedural courses. Available labs, pertinent testing, and imaging results were personally reviewed by me. This patient has been appropriately cleared by cardiology/electrophysiology.   Based on clinical review performed today (07/10/20), barring and significant acute changes in the patient's overall condition, it is anticipated that he will be able to proceed with the planned surgical intervention. Any acute changes in clinical condition may necessitate his procedure being postponed and/or cancelled. Pre-surgical instructions were reviewed with the patient during his PAT appointment and questions were fielded by PAT clinical staff.  Honor Loh, MSN, APRN, FNP-C, CEN [facility]    Peri-operative Services Nurse Practitioner Phone: [phone] 07/10/20 3:27 PM  NOTE: This note has been prepared using Dragon dictation software. Despite my best ability to proofread, there is always the potential that unintentional transcriptional errors may still occur from this process.

## 2020-07-11 ENCOUNTER — Telehealth: Payer: Self-pay | Admitting: *Deleted

## 2020-07-11 ENCOUNTER — Other Ambulatory Visit: Payer: Medicare Other

## 2020-07-11 ENCOUNTER — Other Ambulatory Visit
Admission: RE | Admit: 2020-07-11 | Discharge: 2020-07-11 | Disposition: A | Discharge status: Home / Self Care | Payer: Medicare Other | Source: Ambulatory Visit | Attending: Urology | Admitting: Urology

## 2020-07-11 ENCOUNTER — Other Ambulatory Visit: Payer: Self-pay | Admitting: Radiology

## 2020-07-11 DIAGNOSIS — Z20822 Contact with and (suspected) exposure to covid-19: Secondary | ICD-10-CM | POA: Diagnosis not present

## 2020-07-11 DIAGNOSIS — Z01812 Encounter for preprocedural laboratory examination: Secondary | ICD-10-CM | POA: Insufficient documentation

## 2020-07-11 LAB — SARS CORONAVIRUS 2 (TAT 6-24 HRS): SARS Coronavirus 2: NEGATIVE

## 2020-07-11 MED ORDER — SULFAMETHOXAZOLE-TRIMETHOPRIM 800-160 MG PO TABS
1.0000 | ORAL_TABLET | Freq: Two times a day (BID) | ORAL | 0 refills | Status: DC
Start: 2020-07-11 — End: 2020-08-21

## 2020-07-11 NOTE — Telephone Encounter (Addendum)
Patient informed, RX sent in. Voiced understanding.    ----- Message from [name], MD sent at 07/11/2020  8:19 AM EDT ----- Please treat preop with bactrim ds bid x 7 days starting 5 days prior to surgery  [name], MD

## 2020-07-15 ENCOUNTER — Ambulatory Visit: Payer: Medicare Other | Admitting: Anesthesiology

## 2020-07-15 ENCOUNTER — Encounter
Admission: RE | Disposition: A | Discharge status: Home / Self Care | Payer: Self-pay | Source: Home / Self Care | Attending: Urology

## 2020-07-15 ENCOUNTER — Other Ambulatory Visit: Payer: Self-pay

## 2020-07-15 ENCOUNTER — Ambulatory Visit
Admission: RE | Admit: 2020-07-15 | Discharge: 2020-07-15 | Disposition: A | Discharge status: Home / Self Care | Payer: Medicare Other | Attending: Urology | Admitting: Urology

## 2020-07-15 ENCOUNTER — Ambulatory Visit: Payer: Medicare Other | Admitting: Urgent Care

## 2020-07-15 ENCOUNTER — Encounter: Payer: Self-pay | Admitting: Urology

## 2020-07-15 DIAGNOSIS — Z87442 Personal history of urinary calculi: Secondary | ICD-10-CM | POA: Insufficient documentation

## 2020-07-15 DIAGNOSIS — N138 Other obstructive and reflux uropathy: Secondary | ICD-10-CM | POA: Diagnosis not present

## 2020-07-15 DIAGNOSIS — I255 Ischemic cardiomyopathy: Secondary | ICD-10-CM | POA: Insufficient documentation

## 2020-07-15 DIAGNOSIS — I252 Old myocardial infarction: Secondary | ICD-10-CM | POA: Insufficient documentation

## 2020-07-15 DIAGNOSIS — N401 Enlarged prostate with lower urinary tract symptoms: Secondary | ICD-10-CM | POA: Diagnosis not present

## 2020-07-15 DIAGNOSIS — G709 Myoneural disorder, unspecified: Secondary | ICD-10-CM | POA: Diagnosis not present

## 2020-07-15 DIAGNOSIS — J45909 Unspecified asthma, uncomplicated: Secondary | ICD-10-CM | POA: Diagnosis not present

## 2020-07-15 DIAGNOSIS — K449 Diaphragmatic hernia without obstruction or gangrene: Secondary | ICD-10-CM | POA: Insufficient documentation

## 2020-07-15 DIAGNOSIS — I4891 Unspecified atrial fibrillation: Secondary | ICD-10-CM | POA: Insufficient documentation

## 2020-07-15 DIAGNOSIS — I2510 Atherosclerotic heart disease of native coronary artery without angina pectoris: Secondary | ICD-10-CM | POA: Diagnosis not present

## 2020-07-15 DIAGNOSIS — M5430 Sciatica, unspecified side: Secondary | ICD-10-CM | POA: Insufficient documentation

## 2020-07-15 DIAGNOSIS — M1990 Unspecified osteoarthritis, unspecified site: Secondary | ICD-10-CM | POA: Insufficient documentation

## 2020-07-15 DIAGNOSIS — K219 Gastro-esophageal reflux disease without esophagitis: Secondary | ICD-10-CM | POA: Diagnosis not present

## 2020-07-15 DIAGNOSIS — E785 Hyperlipidemia, unspecified: Secondary | ICD-10-CM | POA: Diagnosis not present

## 2020-07-15 DIAGNOSIS — N210 Calculus in bladder: Secondary | ICD-10-CM | POA: Diagnosis not present

## 2020-07-15 DIAGNOSIS — Z8673 Personal history of transient ischemic attack (TIA), and cerebral infarction without residual deficits: Secondary | ICD-10-CM | POA: Insufficient documentation

## 2020-07-15 DIAGNOSIS — M545 Low back pain: Secondary | ICD-10-CM | POA: Insufficient documentation

## 2020-07-15 DIAGNOSIS — Z95810 Presence of automatic (implantable) cardiac defibrillator: Secondary | ICD-10-CM | POA: Insufficient documentation

## 2020-07-15 DIAGNOSIS — Z96652 Presence of left artificial knee joint: Secondary | ICD-10-CM | POA: Insufficient documentation

## 2020-07-15 DIAGNOSIS — N320 Bladder-neck obstruction: Secondary | ICD-10-CM

## 2020-07-15 DIAGNOSIS — R0602 Shortness of breath: Secondary | ICD-10-CM | POA: Diagnosis not present

## 2020-07-15 DIAGNOSIS — R001 Bradycardia, unspecified: Secondary | ICD-10-CM | POA: Diagnosis not present

## 2020-07-15 DIAGNOSIS — R310 Gross hematuria: Secondary | ICD-10-CM | POA: Diagnosis not present

## 2020-07-15 DIAGNOSIS — Z7901 Long term (current) use of anticoagulants: Secondary | ICD-10-CM | POA: Insufficient documentation

## 2020-07-15 DIAGNOSIS — Z9842 Cataract extraction status, left eye: Secondary | ICD-10-CM | POA: Diagnosis not present

## 2020-07-15 DIAGNOSIS — Z9841 Cataract extraction status, right eye: Secondary | ICD-10-CM | POA: Insufficient documentation

## 2020-07-15 DIAGNOSIS — I459 Conduction disorder, unspecified: Secondary | ICD-10-CM | POA: Diagnosis not present

## 2020-07-15 DIAGNOSIS — I10 Essential (primary) hypertension: Secondary | ICD-10-CM | POA: Diagnosis not present

## 2020-07-15 DIAGNOSIS — I700 Atherosclerosis of aorta: Secondary | ICD-10-CM | POA: Insufficient documentation

## 2020-07-15 DIAGNOSIS — G8929 Other chronic pain: Secondary | ICD-10-CM | POA: Insufficient documentation

## 2020-07-15 DIAGNOSIS — K5730 Diverticulosis of large intestine without perforation or abscess without bleeding: Secondary | ICD-10-CM | POA: Insufficient documentation

## 2020-07-15 DIAGNOSIS — N400 Enlarged prostate without lower urinary tract symptoms: Secondary | ICD-10-CM

## 2020-07-15 DIAGNOSIS — E118 Type 2 diabetes mellitus with unspecified complications: Secondary | ICD-10-CM | POA: Diagnosis not present

## 2020-07-15 HISTORY — PX: CYSTOSCOPY WITH LITHOLAPAXY: SHX1425

## 2020-07-15 HISTORY — PX: CYSTOSCOPY WITH INSERTION OF UROLIFT: SHX6678

## 2020-07-15 LAB — GLUCOSE, CAPILLARY
Glucose-Capillary: 145 mg/dL — ABNORMAL HIGH (ref 70–99)
Glucose-Capillary: 130 mg/dL — ABNORMAL HIGH (ref 70–99)

## 2020-07-15 SURGERY — CYSTOSCOPY WITH INSERTION OF UROLIFT
Anesthesia: General | Site: Prostate

## 2020-07-15 MED ORDER — EPHEDRINE SULFATE 50 MG/ML IJ SOLN
INTRAMUSCULAR | Status: DC | PRN
  Administered 2020-07-15 (×3): 10 mg via INTRAVENOUS

## 2020-07-15 MED ORDER — LIDOCAINE HCL (CARDIAC) PF 100 MG/5ML IV SOSY
PREFILLED_SYRINGE | INTRAVENOUS | Status: DC | PRN
  Administered 2020-07-15: 100 mg via INTRAVENOUS

## 2020-07-15 MED ORDER — CHLORHEXIDINE GLUCONATE 0.12 % MT SOLN: 15.0000 mL | Freq: Once | OROMUCOSAL | Status: AC

## 2020-07-15 MED ORDER — LIDOCAINE HCL (PF) 2 % IJ SOLN
INTRAMUSCULAR | Status: AC
  Filled 2020-07-15: qty 5

## 2020-07-15 MED ORDER — ORAL CARE MOUTH RINSE: 15.0000 mL | Freq: Once | OROMUCOSAL | Status: AC

## 2020-07-15 MED ORDER — HYDROCODONE-ACETAMINOPHEN 5-325 MG PO TABS: 1.0000 | ORAL_TABLET | Freq: Four times a day (QID) | ORAL | 0 refills | Status: DC | PRN

## 2020-07-15 MED ORDER — DEXAMETHASONE SODIUM PHOSPHATE 10 MG/ML IJ SOLN
INTRAMUSCULAR | Status: DC | PRN
  Administered 2020-07-15: 10 mg via INTRAVENOUS

## 2020-07-15 MED ORDER — CHLORHEXIDINE GLUCONATE 0.12 % MT SOLN
OROMUCOSAL | Status: AC
  Administered 2020-07-15: 15 mL via OROMUCOSAL
  Filled 2020-07-15: qty 15

## 2020-07-15 MED ORDER — FENTANYL CITRATE (PF) 100 MCG/2ML IJ SOLN: 25.0000 ug | INTRAMUSCULAR | Status: DC | PRN

## 2020-07-15 MED ORDER — FENTANYL CITRATE (PF) 100 MCG/2ML IJ SOLN
INTRAMUSCULAR | Status: AC
  Filled 2020-07-15: qty 2

## 2020-07-15 MED ORDER — EPHEDRINE 5 MG/ML INJ
INTRAVENOUS | Status: AC
  Filled 2020-07-15: qty 10

## 2020-07-15 MED ORDER — PROPOFOL 10 MG/ML IV BOLUS
INTRAVENOUS | Status: DC | PRN
  Administered 2020-07-15: 180 mg via INTRAVENOUS

## 2020-07-15 MED ORDER — FENTANYL CITRATE (PF) 100 MCG/2ML IJ SOLN
INTRAMUSCULAR | Status: DC | PRN
  Administered 2020-07-15: 25 ug via INTRAVENOUS

## 2020-07-15 MED ORDER — CEFAZOLIN SODIUM-DEXTROSE 2-4 GM/100ML-% IV SOLN
2.0000 g | INTRAVENOUS | Status: AC
  Administered 2020-07-15: 2 g via INTRAVENOUS

## 2020-07-15 MED ORDER — ONDANSETRON HCL 4 MG/2ML IJ SOLN
INTRAMUSCULAR | Status: DC | PRN
  Administered 2020-07-15: 4 mg via INTRAVENOUS

## 2020-07-15 MED ORDER — PROPOFOL 10 MG/ML IV BOLUS
INTRAVENOUS | Status: AC
  Filled 2020-07-15: qty 20

## 2020-07-15 MED ORDER — CEFAZOLIN SODIUM-DEXTROSE 2-4 GM/100ML-% IV SOLN
INTRAVENOUS | Status: AC
  Filled 2020-07-15: qty 100

## 2020-07-15 MED ORDER — DEXAMETHASONE SODIUM PHOSPHATE 10 MG/ML IJ SOLN
INTRAMUSCULAR | Status: AC
  Filled 2020-07-15: qty 1

## 2020-07-15 MED ORDER — GLYCOPYRROLATE 0.2 MG/ML IJ SOLN
INTRAMUSCULAR | Status: AC
  Filled 2020-07-15: qty 1

## 2020-07-15 MED ORDER — ONDANSETRON HCL 4 MG/2ML IJ SOLN
INTRAMUSCULAR | Status: AC
  Filled 2020-07-15: qty 2

## 2020-07-15 MED ORDER — SODIUM CHLORIDE 0.9 % IV SOLN: INTRAVENOUS | Status: DC

## 2020-07-15 SURGICAL SUPPLY — 17 items
BAG DRAIN CYSTO-URO LG1000N (MISCELLANEOUS) ×4 IMPLANT
BASKET ZERO TIP 1.9FR (BASKET) ×2 IMPLANT
BSKT STON RTRVL ZERO TP 1.9FR (BASKET)
FIBER LASER FLEXIVA 550 (UROLOGICAL SUPPLIES) ×2 IMPLANT
GLOVE BIO SURGEON STRL SZ 6.5 (GLOVE) ×3 IMPLANT
GLOVE BIO SURGEONS STRL SZ 6.5 (GLOVE) ×1
GOWN STRL REUS W/ TWL LRG LVL3 (GOWN DISPOSABLE) ×4 IMPLANT
GOWN STRL REUS W/TWL LRG LVL3 (GOWN DISPOSABLE) ×8
KIT TURNOVER CYSTO (KITS) ×4 IMPLANT
PACK CYSTO AR (MISCELLANEOUS) ×4 IMPLANT
SET CYSTO W/LG BORE CLAMP LF (SET/KITS/TRAYS/PACK) ×4 IMPLANT
SET IRRIG Y TYPE TUR BLADDER L (SET/KITS/TRAYS/PACK) ×4 IMPLANT
SURGILUBE 2OZ TUBE FLIPTOP (MISCELLANEOUS) IMPLANT
SYRINGE IRR TOOMEY STRL 70CC (SYRINGE) ×4 IMPLANT
SYSTEM UROLIFT (Male Continence) ×10 IMPLANT
WATER STERILE IRR 1000ML POUR (IV SOLUTION) ×4 IMPLANT
WATER STERILE IRR 3000ML UROMA (IV SOLUTION) ×4 IMPLANT

## 2020-07-15 NOTE — Anesthesia Preprocedure Evaluation (Signed)
Anesthesia Evaluation  Patient identified by MRN, date of birth, ID band Patient awake    Reviewed: Allergy & Precautions, H&P , NPO status , Patient's Chart, lab work & pertinent test results  History of Anesthesia Complications Negative for: history of anesthetic complications  Airway Mallampati: III  TM Distance: <3 FB Neck ROM: limited    Dental  (+) Chipped   Pulmonary shortness of breath, asthma ,    Pulmonary exam normal        Cardiovascular Exercise Tolerance: Good hypertension, (-) angina+ CAD, + Past MI and + Cardiac Stents  (-) DOE Normal cardiovascular exam+ dysrhythmias + Cardiac Defibrillator      Neuro/Psych TIA Neuromuscular disease negative psych ROS   GI/Hepatic Neg liver ROS, GERD  Medicated and Controlled,  Endo/Other  diabetes, Type 2  Renal/GU Renal disease     Musculoskeletal   Abdominal   Peds  Hematology negative hematology ROS (+)   Anesthesia Other Findings Past Medical History: No date: AICD (automatic cardioverter/defibrillator) present No date: Allergic rhinitis No date: Arthritis No date: Asthma No date: Biventricular defibrillator-Medtronic     Comment:  Generator replacement 2012 with CRT upgrade No date: BPH (benign prostatic hyperplasia) No date: Cardiac arrest ([place]) No date: Chronic lower back pain No date: Diabetes mellitus without complication ([place])     Comment:  BORDER LINE No date: Dyspnea     Comment:  PFT 12/19/10: FEV1 2.93(107%), FEV1% 72, TLC 8.02(138%),              DLCO 110%, +BD No date: ED (erectile dysfunction) No date: GERD (gastroesophageal reflux disease) No date: GERD (gastroesophageal reflux disease) No date: High-grade heart block No date: History of kidney stones No date: Hyperlipidemia No date: Hypertension No date: ischemic cardiomyopathy     Comment:  stent to a 95%-occluded LAD in 1995; catheterization in               2012 demonstrated  patent grafts and nonobstructive               disease No date: Myocardial infarction ([place]) No date: Sciatic nerve pain No date: Severe sinus bradycardia No date: Syncope     Comment:    nov. 2011: TIA (transient ischemic attack)  Past Surgical History: No date: CATARACT EXTRACTION, BILATERAL 04/01/2018: COLONOSCOPY WITH PROPOFOL; N/A     Comment:  Procedure: COLONOSCOPY WITH PROPOFOL;  Surgeon: [name], MD;  Location: [facility] ENDOSCOPY;  Service:               Endoscopy;  Laterality: N/A; No date: CORONARY ANGIOPLASTY 03/18/2016: EP IMPLANTABLE DEVICE; N/A     Comment:  Procedure:  BiV ICD Generator Changeout;  Surgeon:               [name], MD;  Location: [facility] CV LAB;                Service: Cardiovascular;  Laterality: N/A; 04/01/2018: ESOPHAGOGASTRODUODENOSCOPY (EGD) WITH PROPOFOL; N/A     Comment:  Procedure: ESOPHAGOGASTRODUODENOSCOPY (EGD) WITH               PROPOFOL;  Surgeon: [name], MD;  Location:               [facility] ENDOSCOPY;  Service: Endoscopy;  Laterality: N/A; No date: EYE SURGERY; Bilateral     Comment:  cataract extraction No date: Implantation of Medtronic dual-chamber  cardioverter No date: JOINT REPLACEMENT; Left     Comment:  total knee replacement 2006: Left Knee Replacement No date: PROSTATE BIOPSY 1948: TONSILLECTOMY 06/20/2015: TOTAL KNEE REVISION; Left     Comment:  Procedure: TOTAL KNEE REVISION;  Surgeon: [name],               MD;  Location: [facility] ORS;  Service: Orthopedics;                Laterality: Left;     Reproductive/Obstetrics negative OB ROS                             Anesthesia Physical Anesthesia Plan  ASA: IV  Anesthesia Plan: General LMA   Post-op Pain Management:    Induction: Intravenous  PONV Risk Score and Plan: Dexamethasone, Ondansetron, Midazolam and Treatment may vary due to age or medical condition  Airway Management Planned: LMA  Additional  Equipment:   Intra-op Plan:   Post-operative Plan: Extubation in OR  Informed Consent: I have reviewed the patients History and Physical, chart, labs and discussed the procedure including the risks, benefits and alternatives for the proposed anesthesia with the patient or authorized representative who has indicated his/her understanding and acceptance.     Dental Advisory Given  Plan Discussed with: Anesthesiologist, CRNA and Surgeon  Anesthesia Plan Comments: (Patient consented for risks of anesthesia including but not limited to:  - adverse reactions to medications - damage to eyes, teeth, lips or other oral mucosa - nerve damage due to positioning  - sore throat or hoarseness - Damage to heart, brain, nerves, lungs, other parts of body or loss of life  Patient voiced understanding.)        Anesthesia Quick Evaluation

## 2020-07-15 NOTE — Anesthesia Postprocedure Evaluation (Signed)
Anesthesia Post Note  Patient: Brodin Gelpi.  Procedure(s) Performed: CYSTOSCOPY WITH INSERTION OF UROLIFT (N/A Prostate) CYSTOSCOPY WITH LITHOLAPAXY (N/A Bladder)  Patient location during evaluation: PACU Anesthesia Type: General Level of consciousness: awake and alert Pain management: pain level controlled Vital Signs Assessment: post-procedure vital signs reviewed and stable Respiratory status: spontaneous breathing, nonlabored ventilation, respiratory function stable and patient connected to nasal cannula oxygen Cardiovascular status: blood pressure returned to baseline and stable Postop Assessment: no apparent nausea or vomiting Anesthetic complications: no   No complications documented.   Last Vitals:  Vitals:   07/15/20 1056 07/15/20 1121  BP: (!) 132/82 (!) 152/83  Pulse: 62 61  Resp: 16 16  Temp: (!) 36.3 C   SpO2: 100% 100%    Last Pain:  Vitals:   07/15/20 1121  TempSrc:   PainSc: 2                  Precious Haws Demichael Traum

## 2020-07-15 NOTE — Transfer of Care (Signed)
Immediate Anesthesia Transfer of Care Note  Patient: [name].  Procedure(s) Performed: Procedure(s): CYSTOSCOPY WITH INSERTION OF UROLIFT (N/A) CYSTOSCOPY WITH LITHOLAPAXY (N/A)  Patient Location: PACU  Anesthesia Type:General  Level of Consciousness: sedated  Airway & Oxygen Therapy: Patient Spontanous Breathing and Patient connected to face mask oxygen  Post-op Assessment: Report given to RN and Post -op Vital signs reviewed and stable  Post vital signs: Reviewed and stable  Last Vitals:  Vitals:   07/15/20 0858 07/15/20 1014  BP: (!) 154/82 115/76  Pulse: 62 59  Resp: 16 14  Temp: (!) 36.3 C (!) (P) 36.1 C  SpO2: 63% 335%    Complications: No apparent anesthesia complications

## 2020-07-15 NOTE — H&P (Signed)
Updated 07/15/20 [facility] CT[facility]   CC:     Chief Complaint  Patient presents with  . Cysto   HPI: [name]. is a 79 y.o. M w/ personal hx ofnephrolithiasis, bladder stone, BPH with LUTS, elevated PSA and gross hematuria presents today forcysto.   UA from previous visitsignificant for gross hematuria with greater than 30 RBCs per high-power field  UB 04/25/2020 several pelvic calcifications.  CT hematuria revealed numerous bladder stones measuring up to about 8 mm in size. No other urinary stone disease evident. No suspicious enhancing lesion in either kidney. 16 mm cyst lower pole right kidney with other tiny hypoattenuating lesions in both kidneys too small to characterize but likely benign.  Estimated prostate volume calculated by CT scan to be 68g.  He is on blood thinners for Afib.   Please see previous notes for details.  UA today appears persistently positive however the patient reports that his urinary symptoms have improved since his last visit. They almost completely resolved and now he is only having some low-grade lower urinary tract symptoms at this point time. No fevers or chills. We discussed the risk and benefits of pursuing cystoscopy today possibly in the setting of infection and including the risk of sepsis. He understands that he is likely chronically colonized with the stones and accepts these risks. We will confirm IM ceftriaxone today as UTI prevention and continue oral antibiotics.     Today's Vitals   06/05/20 1457  BP: 115/71  Pulse: 74   There is no height or weight on file to calculate BMI. NED. A&Ox3.   No respiratory distress   Abd soft, NT, ND Normal phallus with bilateral descended testicles  Cystoscopy Procedure Note  Patient identification was confirmed, informed consent was obtained, and patient was prepped using Betadine solution.  Lidocaine jelly was administered per urethral meatus.     Pre-Procedure: -  Inspection reveals a normal caliber ureteral meatus.  Procedure: The flexible cystoscope was introduced without difficulty - No urethral strictures/lesions are present. - Enlarged prostate w/ bilobar coaptation  - Mildly elevated bladder neck - Bilateral ureteral orifices identified - Bladder mucosa  reveals no ulcers, tumors, or lesions - Saccules in diverticula and 8 or so stones nothing more than 8 mm (mostly smaller)  - No trabeculation - Chronic obstruction   Retroflexion shows no median lobe.   Post-Procedure: - Patient tolerated the procedure well  Pertinent Imagings:  CLINICAL DATA: Gross hematuria.  EXAM: CT ABDOMEN AND PELVIS WITHOUT AND WITH CONTRAST  TECHNIQUE: Multidetector CT imaging of the abdomen and pelvis was performed following the standard protocol before and following the bolus administration of intravenous contrast.  CONTRAST: 134mL OMNIPAQUE IOHEXOL 300 MG/ML SOLN  COMPARISON: None.  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  Hepatobiliary: 7 mm hypodensity in the dome of liver is too small to characterize but likely benign. There is no evidence for gallstones, gallbladder wall thickening, or pericholecystic fluid. No intrahepatic or extrahepatic biliary dilation.  Pancreas: No focal mass lesion. No dilatation of the main duct. No intraparenchymal cyst. No peripancreatic edema.  Spleen: No splenomegaly. No focal mass lesion.  Adrenals/Urinary Tract: No adrenal nodule or mass.  Precontrast imaging shows no stones in either kidney or ureter. Numerous bladder stones are evident measuring up to about 8 mm in size.  Imaging after IV contrast administration shows no suspicious enhancing lesion in either kidney. 16 mm low-density lesion lower pole right kidney is compatible with a  cyst. Additional tiny hypoattenuating lesions in the upper pole of each kidney are too small to characterize but  likely benign.  Delayed post-contrast imaging shows no wall thickening or soft tissue filling defect in either intrarenal collecting system or renal pelvis. Both left ureter well opacified without evidence for wall thickening, soft tissue lesion or focal dilatation. Right ureter incompletely opacified but shows no focal dilatation or mass lesion. Delayed imaging of the bladder shows no focal wall thickening.  Stomach/Bowel: Tiny hiatal hernia. Stomach otherwise unremarkable. Duodenum is normally positioned as is the ligament of Treitz. No small bowel wall thickening. No small bowel dilatation. The terminal ileum is normal. The appendix is normal. No gross colonic mass. No colonic wall thickening. Diverticular changes are noted in the left colon without evidence of diverticulitis.  Vascular/Lymphatic: There is abdominal aortic atherosclerosis without aneurysm. There is no gastrohepatic or hepatoduodenal ligament lymphadenopathy. No retroperitoneal or mesenteric lymphadenopathy. No pelvic sidewall lymphadenopathy.  Reproductive: The prostate gland and seminal vesicles are unremarkable.  Other: No intraperitoneal free fluid.  Musculoskeletal: No worrisome lytic or sclerotic osseous abnormality. Degenerative changes noted lumbar spine.  IMPRESSION: 1. Numerous bladder stones measuring up to about 8 mm in size. No other urinary stone disease evident. No suspicious enhancing lesion in either kidney. 2. 16 mm cyst lower pole right kidney with other tiny hypoattenuating lesions in both kidneys too small to characterize but likely benign. 3. Tiny hiatal hernia. 4. Left colonic diverticulosis without diverticulitis. 5. Aortic Atherosclerosis (ICD10-I70.0).   Electronically Signed By: Misty Stanley M.D. On: 05/15/2020 15:16  I have personally reviewed the images and agree with radiologist interpretation.   Assessment/ Plan:  1. Gross hematuria  UA today  revealed infection  Explained to pt it is likely his infection will not be fully cleared so will use a injectable abx (ceftriaxone) to proceed w/ cysto. Pt understood the risks and benefits and will proceed w/ plan.   2. Bladder stones Cysto today revealed bladder stones Recommend cystolitholapaxy without outlet procedure as below   3. BPH w/ outlet obstruction Discussed treatments ofHoLEP, Urolift, and TURPWith concomitant cystolitholapaxy.  We reviewed theeach surgical optionin detail today including the preoperative, intraoperative, and postoperative course. This will most likely be an outpatient procedure pending the degree of post op hematuria. He will go home with catheter for a few days post op and will either be taught how to remove his own catheter or return to the office for catheter removal.  Risk of bleeding, infection, damage surrounding structures, injury to the bladder/ urethral, bladder neck contracture, ureteral stricture, retrograde ejaculation(TURP/HoLEP), stress/ urge incontinence, exacerbation of irritative voiding symptoms were all discussed in detail.  He has elected to proceed w/ Urolift with cystolithalopaxy  Will need to obtain cardiac clearance.  Jamas Lav, am acting as a scribe for Dr. [name],  I have reviewed the above documentation for accuracy and completeness, and I agree with the above.   [name], MD

## 2020-07-15 NOTE — Anesthesia Procedure Notes (Signed)
Procedure Name: LMA Insertion Date/Time: 07/15/2020 9:26 AM Performed by: Doreen Salvage, CRNA Pre-anesthesia Checklist: Patient identified, Patient being monitored, Timeout performed, Emergency Drugs available and Suction available Patient Re-evaluated:Patient Re-evaluated prior to induction Oxygen Delivery Method: Circle system utilized Preoxygenation: Pre-oxygenation with 100% oxygen Induction Type: IV induction Ventilation: Mask ventilation without difficulty LMA: LMA inserted Tube type: Oral Tube size: 3.5 mm Number of attempts: 1 Placement Confirmation: positive ETCO2 and breath sounds checked- equal and bilateral Tube secured with: Tape Dental Injury: Teeth and Oropharynx as per pre-operative assessment

## 2020-07-15 NOTE — Op Note (Signed)
Preoperative diagnosis: BPH with obstructive symptomatology, bladder stones   Postoperative diagnosis: BPH with obstructive symptomatology, bladder stones   Principal procedure: Urolift procedure, with the placement of 5 implants.;  Cystolitholapaxy   Surgeon: [name]   Anesthesia: LMA   Complications: None   Drains: None   Estimated blood loss: < 5 mL   Indications: 79 year-old male with obstructive symptomatology secondary to BPH and bladder stones.  The patient's symptoms have progressed, and he has requested further management.  Management options including TURP with resection/ablation of the prostate as well as Urolift were discussed.  The patient has chosen to have a Urolift procedure.  He has been instructed to the procedure as well as risks and complications which include but are not limited to infection, bleeding, and inadequate treatment with the Urolift procedure alone, anesthetic complications, among others.  He understands these and desires to proceed.   Findings:  There were no urethral lesions.  Prostatic urethra was obstructed secondary to bilobar hypertrophy.  The bladder was inspected circumferentially, multiple bladder stones measuring up to 8 mm, approximately 10.  Trabeculated bladder.   Description of procedure: The patient was properly identified in the holding area.  He received preoperative IV antibiotics.  He was taken to the operating room where general anesthetic was administered with the LMA.  He is placed in the dorsolithotomy position.  Genitalia and perineum were prepped and draped.  Proper timeout was performed.  A 21 French scope was advanced per urethra into the bladder.  Notably, the bladder itself was trabeculated and there were multiple stones layering around the bladder neck up to 8 mm.  Approximately 10 or so of the stones.  Elected to exchange the scope for 26 French resectoscope in order to use continuous irrigation.  In order to accommodate the  scope, male sounds were used per urethral meatus.  A 550 m laser fiber was then brought in and using the settings of 1 J and 20 Hz, the stones were fragmented into smaller pieces.  These were then able to be irrigated from the bladder using a Toomey syringe.  All stones were cleared from the bladder at this point in time.  The scope was removed.   A 41F cystoscope was inserted into the bladder with findings as described above.  The 1st pair of implants were placed at the bladder neck ~1.5 cm from the bladder Neck. The 2nd pair of implants were placed at the level of the verumontanum.  The fossa was inspected and noted to have a fairly good anterior channel.  There is some of slight bulging on the right side in particular and thus on final stacking technique was used on the proximal prostatic urethra in order to achieve symmetry compared to the right.  A total of 5 implants were delivered.  A final cystoscopy was conducted first to inspect the location and state of each implant and second, to confirm the presence of a continuous anterior channel was present through the prostatic urethra with irrigation flow turned off.      Following this, the scope was removed.  After anesthetic reversal he was transported to the PACU in stable condition.  He tolerated the procedure well.   Plan: He will follow-up with me in 4 to 6 weeks for IPSS/PVR.

## 2020-07-15 NOTE — Discharge Instructions (Signed)
AMBULATORY SURGERY  DISCHARGE INSTRUCTIONS   1) The drugs that you were given will stay in your system until tomorrow so for the next 24 hours you should not:  A) Drive an automobile B) Make any legal decisions C) Drink any alcoholic beverage   2) You may resume regular meals tomorrow.  Today it is better to start with liquids and gradually work up to solid foods.  You may eat anything you prefer, but it is better to start with liquids, then soup and crackers, and gradually work up to solid foods.   3) Please notify your doctor immediately if you have any unusual bleeding, trouble breathing, redness and pain at the surgery site, drainage, fever, or pain not relieved by medication.    4) Additional Instructions:        Please contact your physician with any problems or Same Day Surgery at [phone], Monday through Friday 6 am to 4 pm, or  at [facility] number at [phone].Urolift Post-Operative Instructions     Patient Expectations   1. Mild blood in your urine for about 1 week.  You may resume your blood thinners starting tomorrow as long as your urine is clear or very light pink.  If there is any question whatsoever, call our office and we can help guide you on whether or not to resume this medication.  2. Urinary buring, frequency, and urgency for 10 days.  3. Mild pelvic pain 1-2 weeks.     Return to Activity     1. Drink water post procedure.  2. Take meds as needed.  Tylenol and/or Motrin is most helpful.  You may also by Pyridium/Azo over-the-counter for urinary burning.  3. No lifting or straining 48hrs.  4. Other activity when they feel up to it.

## 2020-07-16 ENCOUNTER — Encounter: Payer: Self-pay | Admitting: Urology

## 2020-07-18 ENCOUNTER — Encounter: Payer: Self-pay | Admitting: Urology

## 2020-08-02 ENCOUNTER — Other Ambulatory Visit: Payer: Medicare Other

## 2020-08-05 ENCOUNTER — Other Ambulatory Visit: Payer: Medicare Other

## 2020-08-08 ENCOUNTER — Other Ambulatory Visit: Payer: Medicare Other

## 2020-08-13 ENCOUNTER — Other Ambulatory Visit: Payer: Medicare Other

## 2020-08-13 ENCOUNTER — Ambulatory Visit: Payer: Medicare Other | Admitting: Urology

## 2020-08-19 NOTE — Progress Notes (Signed)
08/21/2020 11:56 AM   Ronnie April Jr. November 10, 1941 [id number]  Referring provider: Derinda Late, MD [phone] [facility] [place],  [place] [address] Chief Complaint  Patient presents with  . Follow-up    HPI: [name]. is a 79 y.o. male who presents for follow up 5 weeks post op Urolift/cystolitholpaxy on 07/15/2020. He is accompanied by his wife.    CT hematuria revealed numerous bladder stones measuring up to about 8 mm in size. No other urinary stone disease evident. No suspicious enhancing lesion in either kidney. 16 mm cyst lower pole right kidney with other tiny hypoattenuating lesions in both kidneys too small to characterize but likely benign.  Estimated prostate volume calculated by CT scan to be 68g.  He is on blood thinners for Afib. Initially planned for HoLEP but unable to come of anticoagulation for sufficient time thus elected Urolift as alternative.    PVR today is 24 mL. Overall the patient feels better. After surgery he had no bladder control. He used diapers x 10-14 days and bladder control gradually improved. Bleeding stopped 36 hours after procedure. He never had any significant pain.   He is no longer using diapers. He has urgency after sitting for long periods of time. He remains on avodart. Denies burning with urination.    IPSS    Row Name 08/21/20 1300         International Prostate Symptom Score   How often have you had the sensation of not emptying your bladder? Less than half the time     How often have you had to urinate less than every two hours? About half the time     How often have you found you stopped and started again several times when you urinated? Less than half the time     How often have you found it difficult to postpone urination? More than half the time     How often have you had a weak urinary stream? About half the time     How often have you had to strain to start  urination? Less than 1 in 5 times     How many times did you typically get up at night to urinate? 2 Times     Total IPSS Score 17       Quality of Life due to urinary symptoms   If you were to spend the rest of your life with your urinary condition just the way it is now how would you feel about that? Mixed            Score:  1-7 Mild 8-19 Moderate 20-35 Severe  PMH: Past Medical History:  Diagnosis Date  . AICD (automatic cardioverter/defibrillator) present   . Allergic rhinitis   . Arthritis   . Asthma   . Biventricular defibrillator-Medtronic    Generator replacement 2012 with CRT upgrade  . BPH (benign prostatic hyperplasia)   . Cardiac arrest ([place])   . Chronic lower back pain   . Diabetes mellitus without complication ([place])    BORDER LINE  . Dyspnea    PFT 12/19/10: FEV1 2.93(107%), FEV1% 72, TLC 8.02(138%), DLCO 110%, +BD  . ED (erectile dysfunction)   . GERD (gastroesophageal reflux disease)   . GERD (gastroesophageal reflux disease)   . High-grade heart block   . History of kidney stones   . Hyperlipidemia   . Hypertension   . 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 Myocardial infarction ([place])   . Sciatic nerve pain   . Severe sinus bradycardia   . Syncope       . TIA (transient ischemic attack) nov. 2011    Surgical History: Past Surgical History:  Procedure Laterality Date  . CATARACT EXTRACTION, BILATERAL    . COLONOSCOPY WITH PROPOFOL N/A 04/01/2018   Procedure: COLONOSCOPY WITH PROPOFOL;  Surgeon: [name], MD;  Location: [facility] ENDOSCOPY;  Service: Endoscopy;  Laterality: N/A;  . CORONARY ANGIOPLASTY    . CYSTOSCOPY WITH INSERTION OF UROLIFT N/A 07/15/2020   Procedure: CYSTOSCOPY WITH INSERTION OF UROLIFT;  Surgeon: [name], MD;  Location: [facility] ORS;  Service: Urology;  Laterality: N/A;  . CYSTOSCOPY WITH LITHOLAPAXY N/A  07/15/2020   Procedure: CYSTOSCOPY WITH LITHOLAPAXY;  Surgeon: [name], MD;  Location: [facility] ORS;  Service: Urology;  Laterality: N/A;  . EP IMPLANTABLE DEVICE N/A 03/18/2016   Procedure:  BiV ICD Generator Changeout;  Surgeon: [name], MD;  Location: [place] CV LAB;  Service: Cardiovascular;  Laterality: N/A;  . ESOPHAGOGASTRODUODENOSCOPY (EGD) WITH PROPOFOL N/A 04/01/2018   Procedure: ESOPHAGOGASTRODUODENOSCOPY (EGD) WITH PROPOFOL;  Surgeon: [name], MD;  [facility] ENDOSCOPY;  Service: Endoscopy;  Laterality: N/A;  . EYE SURGERY Bilateral    cataract extraction  . Implantation of Medtronic dual-chamber cardioverter    . JOINT REPLACEMENT Left    total knee replacement  . Left Knee Replacement  2006  . PROSTATE BIOPSY    . TONSILLECTOMY  1948  . TOTAL KNEE REVISION Left 06/20/2015   Procedure: TOTAL KNEE REVISION;  Surgeon: [name], MD;  Location: [facility] ORS;  Service: Orthopedics;  Laterality: Left;    Home Medications:  Allergies as of 08/21/2020      Reactions   Beta Adrenergic Blockers Other (See Comments)   Fatigue   Oxycodone Nausea And Vomiting   Sulfonamide Derivatives Nausea And Vomiting      Medication List       Accurate as of August 21, 2020 11:59 PM. If you have any questions, ask your nurse or doctor.        STOP taking these medications   dorzolamide-timolol 22.3-6.8 MG/ML ophthalmic solution Commonly known as: COSOPT Stopped by: [name], MD   HYDROcodone-acetaminophen 5-325 MG tablet Commonly known as: NORCO/VICODIN Stopped by: [name], MD   ipratropium 0.03 % nasal spray Commonly known as: ATROVENT Stopped by: [name], MD   sulfamethoxazole-trimethoprim 800-160 MG tablet Commonly known as: BACTRIM DS Stopped by: [name], MD     TAKE these medications   acetaminophen 500 MG tablet Commonly known as: TYLENOL Take 500 mg by mouth 2 (two) times daily. What changed: Another medication with  the same name was removed. Continue taking this medication, and follow the directions you see here. Changed by: [name], MD   albuterol 108 (90 Base) MCG/ACT inhaler Commonly known as: VENTOLIN HFA Inhale 1-2 puffs into the lungs every 6 (six) hours as needed for wheezing or shortness of breath.   atorvastatin 80 MG tablet Commonly known as: LIPITOR Take 1 tablet (80 mg total) by mouth daily.   bisoprolol 5 MG tablet Commonly known as: ZEBETA Take 1 tablet (5 mg total) by mouth daily.   CITRACAL + D PO Take 2 tablets by mouth daily.   dorzolamide 2 % ophthalmic solution Commonly known as: TRUSOPT 1 drop 3 (three) times  daily.   dutasteride 0.5 MG capsule Commonly known as: AVODART Take 0.5 mg by mouth every 3 (three) days.   Eliquis 5 MG Tabs tablet Generic drug: apixaban TAKE ONE TABLET TWICE DAILY What changed: how much to take   fluconazole 200 MG tablet Commonly known as: DIFLUCAN Take 200 mg by mouth once a week.   glipiZIDE 5 MG 24 hr tablet Commonly known as: GLUCOTROL XL Take 5 mg by mouth daily.   latanoprost 0.005 % ophthalmic solution Commonly known as: XALATAN Place 1 drop into the right eye at bedtime.   losartan 50 MG tablet Commonly known as: COZAAR Take 50 mg by mouth daily.   Melatonin 10 MG Tabs Take 10 mg by mouth at bedtime.   metFORMIN 500 MG 24 hr tablet Commonly known as: GLUCOPHAGE-XR Take 500 mg by mouth in the morning and at bedtime.   montelukast 10 MG tablet Commonly known as: SINGULAIR Take 10 mg by mouth daily.   multivitamin tablet Take 2 tablets by mouth daily.   omeprazole 40 MG capsule Commonly known as: PRILOSEC Take 40 mg by mouth daily.       Allergies:  Allergies  Allergen Reactions  . Beta Adrenergic Blockers Other (See Comments)    Fatigue  . Oxycodone Nausea And Vomiting  . Sulfonamide Derivatives Nausea And Vomiting    Family History: Family History  Problem Relation Age of Onset  .  Emphysema Father   . Lung cancer Father   . Cirrhosis Mother   . Hypertension Brother   . Hypertension Son   . Heart attack Neg Hx   . Stroke Neg Hx     Social History:  reports that he has never smoked. He has never used smokeless tobacco. He reports current alcohol use. He reports that he does not use drugs.   Physical Exam: BP (!) 161/101   Pulse 63   Constitutional:  Alert and oriented, No acute distress. HEENT: [place] AT, moist mucus membranes.  Trachea midline, no masses. Cardiovascular: No clubbing, cyanosis, or edema. Respiratory: Normal respiratory effort, no increased work of breathing. Skin: No rashes, bruises or suspicious lesions. Neurologic: Grossly intact, no focal deficits, moving all 4 extremities. Psychiatric: Normal mood and affect.  Laboratory Data:  Lab Results  Component Value Date   CREATININE 0.80 05/15/2020     Pertinent Imaging: Results for orders placed or performed in visit on 08/21/20  BLADDER SCAN AMB NON-IMAGING  Result Value Ref Range   Scan Result 24 ML      Assessment & Plan:    1. Bladders stones S/p cystolitholpaxy on 07/15/2020.   2. BPH w/ outlet obstruction  S/p Urolift on 07/15/2020.  IPSS score: 17, moderate. PVR is 24 mL.  Continue Avodart for now Anticipate continued gradual improvement of urinary symptoms  Follow-up 4 months with IPSS/PVR to reassess  [facility] [address], [place], [place] [address] [phone]  I, Selena Batten, am acting as a scribe for Dr. [name].  I have reviewed the above documentation for accuracy and completeness, and I agree with the above.   [name], MD

## 2020-08-21 ENCOUNTER — Ambulatory Visit (INDEPENDENT_AMBULATORY_CARE_PROVIDER_SITE_OTHER): Payer: Medicare Other | Admitting: Urology

## 2020-08-21 ENCOUNTER — Other Ambulatory Visit: Payer: Self-pay

## 2020-08-21 VITALS — BP 161/101 | HR 63

## 2020-08-21 DIAGNOSIS — N400 Enlarged prostate without lower urinary tract symptoms: Secondary | ICD-10-CM

## 2020-08-21 DIAGNOSIS — I255 Ischemic cardiomyopathy: Secondary | ICD-10-CM | POA: Diagnosis not present

## 2020-08-21 LAB — BLADDER SCAN AMB NON-IMAGING: Scan Result: 24

## 2020-08-22 ENCOUNTER — Encounter: Payer: Self-pay | Admitting: Urology

## 2020-09-06 ENCOUNTER — Ambulatory Visit: Payer: Self-pay | Admitting: Urology

## 2020-09-13 ENCOUNTER — Telehealth: Payer: Self-pay | Admitting: Internal Medicine

## 2020-09-13 ENCOUNTER — Telehealth: Payer: Self-pay | Admitting: Cardiology

## 2020-09-13 NOTE — Telephone Encounter (Signed)
New message   Pt c/o swelling: STAT is pt has developed SOB within 24 hours  1) How much weight have you gained and in what time span? Pt is not sure    2) If swelling, where is the swelling located? Feet and ankles   3) Are you currently taking a fluid pill? No   4) Are you currently SOB? Yes when he is up doing activity , pt walks everyday and he can no longer do it without stopping to rest   5) Do you have a log of your daily weights (if so, list)? no  6) Have you gained 3 pounds in a day or 5 pounds in a week? No   7) Have you traveled recently? No  He would like to know if Dr [name] thinks he needs to get in to be seen?   Best number -[phone]

## 2020-09-13 NOTE — Telephone Encounter (Signed)
Attempted phone call to pt.  Left voicemail to contact RN at [phone].  Per chart ok to leave detailed message.  Requested pt go to ED for emergent symptoms such as SOB or CP as office  phones will not open again until Monday 09/16/2020 at 8am.

## 2020-09-13 NOTE — Telephone Encounter (Signed)
Patient called in reporting a gradual onset of shortness of breath and LE edema over the past 6 weeks. States he is not short of breath at rest but notices it with more strenuous activity. No chest pain or palpitations. Also noted some LE edema that has progressed. Does not take lasix. He called into the office earlier today with symptoms and was awaiting a callback but missed the call. Currently he is comfortable without significant symptoms. Discussed options with the patient. He would like to wait to come in to the office for visit and understands he would need to go to the ED if things worsen or develops chest pain over the weekend. Advised I would send a message to the office to arrange for outpatient visit.

## 2020-09-16 ENCOUNTER — Telehealth: Payer: Self-pay | Admitting: Internal Medicine

## 2020-09-16 NOTE — Telephone Encounter (Signed)
Pt scheduled to see Dr [name] 09/17/2020 at 0840am in [name]  Pt is aware.

## 2020-09-16 NOTE — Telephone Encounter (Signed)
Follow up:     Patient calling stating that he was on some to call back on Friday. Please call patient.

## 2020-09-16 NOTE — Telephone Encounter (Signed)
The pt states [employer] is sending a new handheld and will receive it in 7-10 business days. Kern Alberta has scheduled the pt to come into the office to see [name] tomorrow at 8:40. The pt states to let Rosann Auerbach know he can be reached at [phone], [phone].

## 2020-09-16 NOTE — Progress Notes (Signed)
Patient Care Team: Derinda Late, MD as PCP - General (Family Medicine) [name], MD as PCP - Cardiology (Cardiology) Rocco Serene, MD (Unknown Physician Specialty)   HPI  [name]. is a 79 y.o. male Seen in followup for ICD implantation initially for syncope in the setting of depressed left ventricular function.  And ischemic heart disease.  He had a 6949-lead. He reached ERI summer 2012 and underwent device explantation with new defibrillator lead insertion with left ventricular lead placement and generator replacement.   He underwent device generator replacement again 3/17   Atrial fibrillation. anticoagulation with Apixoban without bleeding.  Intercurrently has undergone bladder surgery for obstructive symptoms; also multiple stones.  Some improvement but not perfect.  No longer wearing a diaper.  No longer wetting the bed.  DATE TEST EF   2012 Cath  40 % Patent LAD stent  12/15 Echo   55-65 %   7/20 Echo  50-55%      Date K Cr Hgb  6/18 4.3 0.9 14  2/19 4.1 0.9 12.5  3/21 4.6 0.92 13.8    Unfortunately, has developed significant shortness of breath over the last 6-8 weeks.    Accompanied by peripheral edema abdominal distention orthopnea.  His Fitbit has also shown some increased heartbeats over the same period of time.  Unaccompanied by chest discomfort.  Notably, he has prior LAD stenting event was also unassociated with chest pain.     He and his wife are moving from their home of 35 years to  independent living.  It has been  exceedingly stressful but is much better even one month later    Thromboembolic risk factors ( age  -2, HTN-1, TIA/CVA-2, Vasc disease -1, CHF -1) for a CHADSVASc Score of >=7     Past Medical History:  Diagnosis Date  . AICD (automatic cardioverter/defibrillator) present   . Allergic rhinitis   . Arthritis   . Asthma   . Biventricular defibrillator-Medtronic    Generator replacement 2012 with CRT upgrade  . BPH (benign  prostatic hyperplasia)   . Cardiac arrest ([place])   . Chronic lower back pain   . Diabetes mellitus without complication ([place])    BORDER LINE  . Dyspnea    PFT 12/19/10: FEV1 2.93(107%), FEV1% 72, TLC 8.02(138%), DLCO 110%, +BD  . ED (erectile dysfunction)   . GERD (gastroesophageal reflux disease)   . GERD (gastroesophageal reflux disease)   . High-grade heart block   . History of kidney stones   . Hyperlipidemia   . Hypertension   . 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 Myocardial infarction ([place])   . Sciatic nerve pain   . Severe sinus bradycardia   . Syncope       . TIA (transient ischemic attack) nov. 2011    Past Surgical History:  Procedure Laterality Date  . CATARACT EXTRACTION, BILATERAL    . COLONOSCOPY WITH PROPOFOL N/A 04/01/2018   Procedure: COLONOSCOPY WITH PROPOFOL;  Surgeon: [name], MD;  Location[facility] ENDOSCOPY;  Service: Endoscopy;  Laterality: N/A;  . CORONARY ANGIOPLASTY    . CYSTOSCOPY WITH INSERTION OF UROLIFT N/A 07/15/2020   Procedure: CYSTOSCOPY WITH INSERTION OF UROLIFT;  Surgeon: [name], MD;  Location: [facility] ORS;  Service: Urology;  Laterality: N/A;  . CYSTOSCOPY WITH LITHOLAPAXY N/A 07/15/2020   Procedure: CYSTOSCOPY WITH LITHOLAPAXY;  Surgeon: [name], MD;  Location: [facility] ORS;  Service: Urology;  Laterality: N/A;  . EP IMPLANTABLE DEVICE N/A 03/18/2016   Procedure:  BiV ICD Generator Changeout;  Surgeon: [name], MD;  Location: [facility] LAB;  Service: Cardiovascular;  Laterality: N/A;  . ESOPHAGOGASTRODUODENOSCOPY (EGD) WITH PROPOFOL N/A 04/01/2018   Procedure: ESOPHAGOGASTRODUODENOSCOPY (EGD) WITH PROPOFOL;  Surgeon: [name], MD;  Location[facility] ENDOSCOPY;  Service: Endoscopy;  Laterality: N/A;  . EYE SURGERY Bilateral    cataract extraction  . Implantation of Medtronic dual-chamber cardioverter     . JOINT REPLACEMENT Left    total knee replacement  . Left Knee Replacement  2006  . PROSTATE BIOPSY    . TONSILLECTOMY  1948  . TOTAL KNEE REVISION Left 06/20/2015   Procedure: TOTAL KNEE REVISION;  Surgeon: [name], MD;  Location: [facility] ORS;  Service: Orthopedics;  Laterality: Left;    Current Outpatient Medications  Medication Sig Dispense Refill  . acetaminophen (TYLENOL) 500 MG tablet Take 500 mg by mouth 2 (two) times daily.     [name] albuterol (VENTOLIN HFA) 108 (90 Base) MCG/ACT inhaler Inhale 1-2 puffs into the lungs every 6 (six) hours as needed for wheezing or shortness of breath.    [name] apixaban (ELIQUIS) 5 MG TABS tablet Take 5 mg by mouth 2 (two) times daily.    [name] atorvastatin (LIPITOR) 80 MG tablet Take 1 tablet (80 mg total) by mouth daily. 90 tablet 3  . bisoprolol (ZEBETA) 5 MG tablet Take 1 tablet (5 mg total) by mouth daily. 30 tablet 6  . Calcium Citrate-Vitamin D (CITRACAL + D PO) Take 2 tablets by mouth daily.     . dorzolamide (TRUSOPT) 2 % ophthalmic solution 1 drop 2 (two) times daily.     [name] dutasteride (AVODART) 0.5 MG capsule Take 0.5 mg by mouth every 3 (three) days.     . fluconazole (DIFLUCAN) 200 MG tablet Take 200 mg by mouth once a week.    [name] glipiZIDE (GLUCOTROL XL) 5 MG 24 hr tablet Take 5 mg by mouth daily.    [name] latanoprost (XALATAN) 0.005 % ophthalmic solution Place 1 drop into the right eye at bedtime.     [name] losartan (COZAAR) 50 MG tablet Take 50 mg by mouth daily.    . Melatonin 10 MG TABS Take 10 mg by mouth at bedtime.     . metFORMIN (GLUCOPHAGE-XR) 500 MG 24 hr tablet Take 500 mg by mouth in the morning and at bedtime.     . montelukast (SINGULAIR) 10 MG tablet Take 10 mg by mouth daily.     . Multiple Vitamin (MULTIVITAMIN) tablet Take 2 tablets by mouth daily.     [name] omeprazole (PRILOSEC) 40 MG capsule Take 40 mg by mouth daily.     [name] ELIQUIS 5 MG TABS tablet TAKE ONE TABLET TWICE DAILY (Patient taking differently: Take 5 mg by mouth 2 (two)  times daily. ) 180 tablet 2   No current facility-administered medications for this visit.    Allergies  Allergen Reactions  . Beta Adrenergic Blockers Other (See Comments)    Fatigue  . Oxycodone Nausea And Vomiting  . Sulfonamide Derivatives Nausea And Vomiting    Review of Systems negative except from HPI and PMH  Physical Exam BP 130/80 (BP Location: Left Arm, Patient Position: Sitting, Cuff Size: Normal)   Pulse 61   Ht 5\' 6"  (1.676 m)   Wt 199 lb 8 oz (90.5 kg)   SpO2 93%  BMI 32.20 kg/m  Well developed and well nourished in no acute distress HENT normal Neck supple with JVP 10 cm Clear Presacral edema Device pocket well healed; without hematoma or erythema.  There is no tethering  Regular rate and rhythm, no  gallop No  murmur Abd-soft with active BS No Clubbing cyanosis 2-3+ to the knee edema Skin-warm and dry A & Oriented  Grossly normal sensory and motor function  ECG AV pacing at 61 Able 17/14/47 QRS upright lead I and RS lead V1   Assessment and  Plan \ Ischemic Cardiomyopathy -- interval normalization prior LAD stenting  Complete heart block    Congestive heart failure acute  Implantable defibrillator-CRT Medtronic   Atrial fibrillation paroxysmal  Bradycardia-sinus  Hypertension  HFpEF  Hyperlipidemia  No chest pain to suggest ischemia as the cause for his acute decompensation; it is however time to his surgery and perioperative MI must be entertained.  We will obtain a Myoview to look for both infarct, ischemia as well as reassess LVEF.  Would have a low threshold for entertaining catheterization  He is significantly volume overloaded as noted; this dates to the beginning of August both by symptoms as well as by OptiVol.  The triggering event is not clear as noted previously.  We will begin him on diuretics furosemide 40 mg every other day x4 days we will reassess in about 10 to 14 days.  Blood pressure is well controlled.  No interval  atrial fibrillation of note.  It is unlikely that a pulmonary embolism complicated his surgery although possible given that he has been on basically on interrupted anticoagulation  Device function is normal.  He has had these increased heart rates to the 110-120 range.  This is noted both on his histograms as well as on his Fitbit.  They are more of late and I wonder if they are not compensatory for his congestive heart failure.  We will follow for now.

## 2020-09-16 NOTE — Telephone Encounter (Signed)
Spoke with pt who complains of generalized weakness, SOB, and HR of 63-108.  Pt with history of AFIB, anticoagulated with Eliquis.  Requested pt send a transmission from device and pt received msg [phone].  Confirmed with device clinic msg is regarding hand wand needs recharging.  Recommended pt call tech support for assistance at [phone] for assistance in sending remote transmission.  Provided Device Clinic phone number [phone] to contact after speaking with tech support.  Pt verbalizes understanding and agrees with current plan.

## 2020-09-17 ENCOUNTER — Ambulatory Visit (INDEPENDENT_AMBULATORY_CARE_PROVIDER_SITE_OTHER): Payer: Medicare Other | Admitting: Internal Medicine

## 2020-09-17 ENCOUNTER — Encounter: Payer: Self-pay | Admitting: Internal Medicine

## 2020-09-17 ENCOUNTER — Other Ambulatory Visit: Payer: Self-pay

## 2020-09-17 VITALS — BP 130/80 | HR 61 | Ht 66.0 in | Wt 199.5 lb

## 2020-09-17 DIAGNOSIS — Z95810 Presence of automatic (implantable) cardiac defibrillator: Secondary | ICD-10-CM

## 2020-09-17 DIAGNOSIS — I5022 Chronic systolic (congestive) heart failure: Secondary | ICD-10-CM

## 2020-09-17 DIAGNOSIS — I255 Ischemic cardiomyopathy: Secondary | ICD-10-CM | POA: Diagnosis not present

## 2020-09-17 DIAGNOSIS — R0602 Shortness of breath: Secondary | ICD-10-CM

## 2020-09-17 DIAGNOSIS — I459 Conduction disorder, unspecified: Secondary | ICD-10-CM | POA: Diagnosis not present

## 2020-09-17 MED ORDER — FUROSEMIDE 40 MG PO TABS: ORAL_TABLET | ORAL | 0 refills | Status: DC

## 2020-09-17 NOTE — Patient Instructions (Signed)
Medication Instructions:  - Your physician has recommended you make the following change in your medication:   1) START lasix (furosemide) 40 mg - take 1 tablet by mouth EVERY OTHER day x 4 doses  *If you need a refill on your cardiac medications before your next appointment, please call your pharmacy*   Lab Work: - Your physician recommends that you have lab work today: BMP/ BNP/ CBC  If you have labs (blood work) drawn today and your tests are completely normal, you will receive your results only by: [name] MyChart Message (if you have MyChart) OR . A paper copy in the mail If you have any lab test that is abnormal or we need to change your treatment, we will call you to review the results.   Testing/Procedures: - Your physician has requested that you have a lexiscan myoview (Cardiac Nuclear scan)  [place]  Your caregiver has ordered a Stress Test with nuclear imaging. The purpose of this test is to evaluate the blood supply to your heart muscle. This procedure is referred to as a "Non-Invasive Stress Test." This is because other than having an IV started in your vein, nothing is inserted or "invades" your body. Cardiac stress tests are done to find areas of poor blood flow to the heart by determining the extent of coronary artery disease (CAD). Some patients exercise on a treadmill, which naturally increases the blood flow to your heart, while others who are  unable to walk on a treadmill due to physical limitations have a pharmacologic/chemical stress agent called Lexiscan . This medicine will mimic walking on a treadmill by temporarily increasing your coronary blood flow.   Please note: these test may take anywhere between 2-4 hours to complete  PLEASE REPORT TO [place] AT THE FIRST DESK WILL DIRECT YOU WHERE TO GO  Date of Procedure:_____________________________________  Arrival Time for Procedure:______________________________  Instructions  regarding medication:   _x___ : Hold diabetes medication morning of procedure- Glipizide/ Metformin  _x___:  Hold betablocker(s) night before procedure and morning of procedure- Bisoprolol  _x___:  You may take all of your other regular medications the morning of your procedure with enough water to get them down safely  PLEASE NOTIFY THE OFFICE AT LEAST 24 HOURS IN ADVANCE IF YOU ARE UNABLE TO [place].  [phone] AND  PLEASE NOTIFY NUCLEAR MEDICINE AT [facility] AT LEAST 24 HOURS IN ADVANCE IF YOU ARE UNABLE TO KEEP YOUR APPOINTMENT. [phone]  How to prepare for your Myoview test:  1. Do not eat or drink after midnight 2. No caffeine for 24 hours prior to test 3. No smoking 24 hours prior to test. 4. Your medication may be taken with water.  If your doctor stopped a medication because of this test, do not take that medication. 5. Ladies, please do not wear dresses.  Skirts or pants are appropriate. Please wear a short sleeve shirt. 6. No perfume, cologne or lotion. 7. Wear comfortable walking shoes. No heels!   Follow-Up: At [facility], you and your health needs are our priority.  As part of our continuing mission to provide you with exceptional heart care, we have created designated Provider Care Teams.  These Care Teams include your primary Cardiologist (physician) and Advanced Practice Providers (APPs -  Physician Assistants and Nurse Practitioners) who all work together to provide you with the care you need, when you need it.  We recommend signing up for the patient portal called "MyChart".  Sign up information is provided on this After Visit Summary.  MyChart is used to connect with patients for Virtual Visits (Telemedicine).  Patients are able to view lab/test results, encounter notes, upcoming appointments, etc.  Non-urgent messages can be sent to your provider as well.   To learn more about what you can do with MyChart, go to [email or web address].    Your  next appointment:   2 week(s)  The format for your next appointment:   Virtual Visit   Provider:   Virl Axe, MD   Other Instructions   Cardiac Nuclear Scan A cardiac nuclear scan is a test that measures blood flow to the heart when a person is resting and when he or she is exercising. The test looks for problems such as:  Not enough blood reaching a portion of the heart.  The heart muscle not working normally. You may need this test if:  You have heart disease.  You have had abnormal lab results.  You have had heart surgery or a balloon procedure to open up blocked arteries (angioplasty).  You have chest pain.  You have shortness of breath. In this test, a radioactive dye (tracer) is injected into your bloodstream. After the tracer has traveled to your heart, an imaging device is used to measure how much of the tracer is absorbed by or distributed to various areas of your heart. This procedure is usually done at a hospital and takes 2-4 hours. Tell a health care provider about:  Any allergies you have.  All medicines you are taking, including vitamins, herbs, eye drops, creams, and over-the-counter medicines.  Any problems you or family members have had with anesthetic medicines.  Any blood disorders you have.  Any surgeries you have had.  Any medical conditions you have.  Whether you are pregnant or may be pregnant. What are the risks? Generally, this is a safe procedure. However, problems may occur, including:  Serious chest pain and heart attack. This is only a risk if the stress portion of the test is done.  Rapid heartbeat.  Sensation of warmth in your chest. This usually passes quickly.  Allergic reaction to the tracer. What happens before the procedure?  Ask your health care provider about changing or stopping your regular medicines. This is especially important if you are taking diabetes medicines or blood thinners.  Follow instructions from  your health care provider about eating or drinking restrictions.  Remove your jewelry on the day of the procedure. What happens during the procedure?  An IV will be inserted into one of your veins.  Your health care provider will inject a small amount of radioactive tracer through the IV.  You will wait for 20-40 minutes while the tracer travels through your bloodstream.  Your heart activity will be monitored with an electrocardiogram (ECG).  You will lie down on an exam table.  Images of your heart will be taken for about 15-20 minutes.  You may also have a stress test. For this test, one of the following may be done: ? You will exercise on a treadmill or stationary bike. While you exercise, your heart's activity will be monitored with an ECG, and your blood pressure will be checked. ? You will be given medicines that will increase blood flow to parts of your heart. This is done if you are unable to exercise.  When blood flow to your heart has peaked, a tracer will again be injected through the IV.  After 20-40 minutes, you will  get back on the exam table and have more images taken of your heart.  Depending on the type of tracer used, scans may need to be repeated 3-4 hours later.  Your IV line will be removed when the procedure is over. The procedure may vary among health care providers and hospitals. What happens after the procedure?  Unless your health care provider tells you otherwise, you may return to your normal schedule, including diet, activities, and medicines.  Unless your health care provider tells you otherwise, you may increase your fluid intake. This will help to flush the contrast dye from your body. Drink enough fluid to keep your urine pale yellow.  Ask your health care provider, or the department that is doing the test: ? When will my results be ready? ? How will I get my results? Summary  A cardiac nuclear scan measures the blood flow to the heart when a  person is resting and when he or she is exercising.  Tell your health care provider if you are pregnant.  Before the procedure, ask your health care provider about changing or stopping your regular medicines. This is especially important if you are taking diabetes medicines or blood thinners.  After the procedure, unless your health care provider tells you otherwise, increase your fluid intake. This will help flush the contrast dye from your body.  After the procedure, unless your health care provider tells you otherwise, you may return to your normal schedule, including diet, activities, and medicines. This information is not intended to replace advice given to you by your health care provider. Make sure you discuss any questions you have with your health care provider. Document Revised: 05/23/2018 Document Reviewed: 05/23/2018 Elsevier Patient Education  [place].

## 2020-09-19 LAB — CBC WITH DIFFERENTIAL/PLATELET
Basophils Absolute: 0.1 10*3/uL (ref 0.0–0.2)
Basos: 1 %
EOS (ABSOLUTE): 0.1 10*3/uL (ref 0.0–0.4)
Eos: 1 %
Hematocrit: 36.6 % — ABNORMAL LOW (ref 37.5–51.0)
Hemoglobin: 12 g/dL — ABNORMAL LOW (ref 13.0–17.7)
Immature Grans (Abs): 0 10*3/uL (ref 0.0–0.1)
Immature Granulocytes: 0 %
Lymphocytes Absolute: 0.9 10*3/uL (ref 0.7–3.1)
Lymphs: 11 %
MCH: 28.8 pg (ref 26.6–33.0)
MCHC: 32.8 g/dL (ref 31.5–35.7)
MCV: 88 fL (ref 79–97)
Monocytes Absolute: 0.7 10*3/uL (ref 0.1–0.9)
Monocytes: 9 %
Neutrophils Absolute: 6.2 10*3/uL (ref 1.4–7.0)
Neutrophils: 78 %
Platelets: 179 10*3/uL (ref 150–450)
RBC: 4.16 x10E6/uL (ref 4.14–5.80)
RDW: 13.8 % (ref 11.6–15.4)
WBC: 7.9 10*3/uL (ref 3.4–10.8)

## 2020-09-19 LAB — CUP PACEART INCLINIC DEVICE CHECK
Battery Impedance: 47 Ohm
Battery Remaining Longevity: 20 mo
Brady Statistic AP VP Percent: 93.5 %
Brady Statistic AP VS Percent: 0.1 % — CL
Brady Statistic AS VP Percent: 6.4 %
Brady Statistic AS VS Percent: 0.1 % — CL
Date Time Interrogation Session: 20210928210936
Implantable Lead Implant Date: 20070615
Implantable Lead Implant Date: 20120813
Implantable Lead Implant Date: 20120813
Implantable Lead Location: 753858
Implantable Lead Location: 753859
Implantable Lead Location: 753860
Implantable Lead Model: 4396
Implantable Lead Model: 5076
Implantable Lead Model: 7121
Implantable Pulse Generator Implant Date: 20170329
Lead Channel Impedance Value: 342 Ohm
Lead Channel Impedance Value: 342 Ohm
Lead Channel Impedance Value: 418 Ohm
Lead Channel Pacing Threshold Amplitude: 0.75 V
Lead Channel Pacing Threshold Amplitude: 1 V
Lead Channel Pacing Threshold Amplitude: 1.25 V
Lead Channel Pacing Threshold Pulse Width: 0.4 ms
Lead Channel Pacing Threshold Pulse Width: 0.4 ms
Lead Channel Pacing Threshold Pulse Width: 0.4 ms

## 2020-09-19 LAB — BASIC METABOLIC PANEL
BUN/Creatinine Ratio: 21 (ref 10–24)
BUN: 19 mg/dL (ref 8–27)
CO2: 22 mmol/L (ref 20–29)
Calcium: 10.1 mg/dL (ref 8.6–10.2)
Chloride: 104 mmol/L (ref 96–106)
Creatinine, Ser: 0.9 mg/dL (ref 0.76–1.27)
GFR calc Af Amer: 94 mL/min/{1.73_m2} (ref >59)
GFR calc non Af Amer: 82 mL/min/{1.73_m2} (ref >59)
Glucose: 146 mg/dL — ABNORMAL HIGH (ref 65–99)
Potassium: 4.4 mmol/L (ref 3.5–5.2)
Sodium: 144 mmol/L (ref 134–144)

## 2020-09-19 LAB — BRAIN NATRIURETIC PEPTIDE: BNP: 562 pg/mL — ABNORMAL HIGH (ref 0.0–100.0)

## 2020-09-24 ENCOUNTER — Ambulatory Visit (INDEPENDENT_AMBULATORY_CARE_PROVIDER_SITE_OTHER): Payer: Medicare Other

## 2020-09-24 ENCOUNTER — Other Ambulatory Visit: Payer: Self-pay

## 2020-09-24 ENCOUNTER — Ambulatory Visit
Admission: RE | Admit: 2020-09-24 | Discharge: 2020-09-24 | Disposition: A | Discharge status: Home / Self Care | Payer: Medicare Other | Source: Ambulatory Visit | Attending: Internal Medicine | Admitting: Internal Medicine

## 2020-09-24 DIAGNOSIS — R0602 Shortness of breath: Secondary | ICD-10-CM | POA: Diagnosis present

## 2020-09-24 DIAGNOSIS — I255 Ischemic cardiomyopathy: Secondary | ICD-10-CM

## 2020-09-24 LAB — CUP PACEART REMOTE DEVICE CHECK
Battery Remaining Longevity: 19 mo
Battery Voltage: 2.91 V
Brady Statistic AP VP Percent: 72.38 %
Brady Statistic AP VS Percent: 0 %
Brady Statistic AS VP Percent: 27.58 %
Brady Statistic AS VS Percent: 0.04 %
Brady Statistic RA Percent Paced: 72.2 %
Brady Statistic RV Percent Paced: 99.83 %
Date Time Interrogation Session: 20211005022705
HighPow Impedance: 53 Ohm
HighPow Impedance: 67 Ohm
Implantable Lead Implant Date: 20070615
Implantable Lead Implant Date: 20120813
Implantable Lead Implant Date: 20120813
Implantable Lead Location: 753858
Implantable Lead Location: 753859
Implantable Lead Location: 753860
Implantable Lead Model: 4396
Implantable Lead Model: 5076
Implantable Lead Model: 7121
Implantable Pulse Generator Implant Date: 20170329
Lead Channel Impedance Value: 247 Ohm
Lead Channel Impedance Value: 399 Ohm
Lead Channel Impedance Value: 399 Ohm
Lead Channel Impedance Value: 513 Ohm
Lead Channel Impedance Value: 589 Ohm
Lead Channel Impedance Value: 988 Ohm
Lead Channel Pacing Threshold Amplitude: 1.125 V
Lead Channel Pacing Threshold Pulse Width: 0.4 ms
Lead Channel Sensing Intrinsic Amplitude: 0.625 mV
Lead Channel Sensing Intrinsic Amplitude: 0.625 mV
Lead Channel Sensing Intrinsic Amplitude: 6.375 mV
Lead Channel Sensing Intrinsic Amplitude: 6.375 mV
Lead Channel Setting Pacing Amplitude: 1.75 V
Lead Channel Setting Pacing Amplitude: 2 V
Lead Channel Setting Pacing Amplitude: 2.5 V
Lead Channel Setting Pacing Pulse Width: 0.4 ms
Lead Channel Setting Pacing Pulse Width: 0.8 ms
Lead Channel Setting Sensing Sensitivity: 0.3 mV

## 2020-09-24 LAB — NM MYOCAR MULTI W/SPECT W/WALL MOTION / EF
LV dias vol: 96 mL (ref 62–150)
LV sys vol: 46 mL
Peak HR: 106 {beats}/min
Percent HR: 74 %
Rest HR: 104 {beats}/min
TID: 0.99

## 2020-09-24 MED ORDER — TECHNETIUM TC 99M TETROFOSMIN IV KIT
9.9460 | PACK | Freq: Once | INTRAVENOUS | Status: AC | PRN
  Administered 2020-09-24: 9.946 via INTRAVENOUS

## 2020-09-24 MED ORDER — REGADENOSON 0.4 MG/5ML IV SOLN
0.4000 mg | Freq: Once | INTRAVENOUS | Status: AC
  Administered 2020-09-24: 0.4 mg via INTRAVENOUS

## 2020-09-24 MED ORDER — TECHNETIUM TC 99M TETROFOSMIN IV KIT
30.7630 | PACK | Freq: Once | INTRAVENOUS | Status: AC | PRN
  Administered 2020-09-24: 30.763 via INTRAVENOUS

## 2020-09-27 NOTE — Progress Notes (Signed)
Remote ICD transmission.   

## 2020-09-30 NOTE — Progress Notes (Signed)
Patient Care Team: Derinda Late, MD as PCP - General (Family Medicine) [name], MD as PCP - Cardiology (Cardiology) Rocco Serene, MD (Unknown Physician Specialty)   HPI  [name]. is a 79 y.o. male Seen in followup for ICD implantation initially for syncope in the setting of depressed left ventricular function.  And ischemic heart disease.  He had a 6949-lead. He reached ERI summer 2012 and underwent device explantation with new defibrillator lead insertion with left ventricular lead placement and generator replacement.   He underwent device generator replacement again 3/17   Atrial fibrillation. anticoagulation with Apixoban without bleeding.  Interval significant SOB at last visit with edema orthopnea and abdominal distension No chest pain Myoview (See Below) much improved following increase diuretics. He has noted variability in heart rate in the 60s or 110s. (See below)  No chest pain. No edema    DATE TEST EF   2012 Cath  40 % Patent LAD stent  12/15 Echo   55-65 %   7/20 Echo  50-55%   10/21 Myoview 46% No significant ischemia     Date K Cr Hgb  6/18 4.3 0.9 14  2/19 4.1 0.9 12.5  3/21 4.6 0.92 13.8  9/21 4.4 0.9 12.0       Thromboembolic risk factors ( age  -2, HTN-1, TIA/CVA-2, Vasc disease -1, CHF -1) for a CHADSVASc Score of >=7     Past Medical History:  Diagnosis Date   AICD (automatic cardioverter/defibrillator) present    Allergic rhinitis    Arthritis    Asthma    Biventricular defibrillator-Medtronic    Generator replacement 2012 with CRT upgrade   BPH (benign prostatic hyperplasia)    Cardiac arrest ([place])    Chronic lower back pain    Diabetes mellitus without complication ([place])    BORDER LINE   Dyspnea    PFT 12/19/10: FEV1 2.93(107%), FEV1% 72, TLC 8.02(138%), DLCO 110%, +BD   ED (erectile dysfunction)    GERD (gastroesophageal reflux disease)    GERD (gastroesophageal reflux disease)    High-grade  heart block    History of kidney stones    Hyperlipidemia    Hypertension    ischemic cardiomyopathy    stent to a 95%-occluded LAD in 1995; catheterization in 2012 demonstrated patent grafts and nonobstructive disease   Myocardial infarction [facility])    Sciatic nerve pain    Severe sinus bradycardia    Syncope        TIA (transient ischemic attack) nov. 2011    Past Surgical History:  Procedure Laterality Date   CATARACT EXTRACTION, BILATERAL     COLONOSCOPY WITH PROPOFOL N/A 04/01/2018   Procedure: COLONOSCOPY WITH PROPOFOL;  Surgeon: [name], MD;  Location: [facility] ENDOSCOPY;  Service: Endoscopy;  Laterality: N/A;   CORONARY ANGIOPLASTY     CYSTOSCOPY WITH INSERTION OF UROLIFT N/A 07/15/2020   Procedure: CYSTOSCOPY WITH INSERTION OF UROLIFT;  Surgeon: [name], MD;  Location: [facility] ORS;  Service: Urology;  Laterality: N/A;   CYSTOSCOPY WITH LITHOLAPAXY N/A 07/15/2020   Procedure: CYSTOSCOPY WITH LITHOLAPAXY;  Surgeon: [name], MD;  Location: [facility] ORS;  Service: Urology;  Laterality: N/A;   EP IMPLANTABLE DEVICE N/A 03/18/2016   Procedure:  BiV ICD Generator Changeout;  Surgeon: [name], MD;  Location: [place] CV LAB;  Service: Cardiovascular;  Laterality: N/A;   ESOPHAGOGASTRODUODENOSCOPY (EGD) WITH PROPOFOL N/A 04/01/2018   Procedure: ESOPHAGOGASTRODUODENOSCOPY (EGD) WITH PROPOFOL;  Surgeon: [name], MD;  Location: [facility] ENDOSCOPY;  Service: Endoscopy;  Laterality: N/A;   EYE SURGERY Bilateral    cataract extraction   Implantation of Medtronic dual-chamber cardioverter     JOINT REPLACEMENT Left    total knee replacement   Left Knee Replacement  2006   PROSTATE BIOPSY     TONSILLECTOMY  1948   TOTAL KNEE REVISION Left 06/20/2015   Procedure: TOTAL KNEE REVISION;  Surgeon: [name], MD;  Location: [facility] ORS;  Service: Orthopedics;  Laterality: Left;    Current Outpatient Medications  Medication Sig Dispense Refill    acetaminophen (TYLENOL) 500 MG tablet Take 500 mg by mouth 2 (two) times daily.      albuterol (VENTOLIN HFA) 108 (90 Base) MCG/ACT inhaler Inhale 1-2 puffs into the lungs every 6 (six) hours as needed for wheezing or shortness of breath.     apixaban (ELIQUIS) 5 MG TABS tablet Take 5 mg by mouth 2 (two) times daily.     atorvastatin (LIPITOR) 80 MG tablet Take 1 tablet (80 mg total) by mouth daily. 90 tablet 3   bisoprolol (ZEBETA) 5 MG tablet Take 1 tablet (5 mg total) by mouth daily. 30 tablet 6   Calcium Citrate-Vitamin D (CITRACAL + D PO) Take 2 tablets by mouth daily.      dorzolamide (TRUSOPT) 2 % ophthalmic solution 1 drop 2 (two) times daily.      dutasteride (AVODART) 0.5 MG capsule Take 0.5 mg by mouth every 3 (three) days.      fluconazole (DIFLUCAN) 200 MG tablet Take 200 mg by mouth once a week.     glipiZIDE (GLUCOTROL XL) 5 MG 24 hr tablet Take 5 mg by mouth daily.     latanoprost (XALATAN) 0.005 % ophthalmic solution Place 1 drop into the right eye at bedtime.      losartan (COZAAR) 50 MG tablet Take 50 mg by mouth daily.     Melatonin 10 MG TABS Take 10 mg by mouth at bedtime.      metFORMIN (GLUCOPHAGE-XR) 500 MG 24 hr tablet Take 500 mg by mouth in the morning and at bedtime.      montelukast (SINGULAIR) 10 MG tablet Take 10 mg by mouth daily.      Multiple Vitamin (MULTIVITAMIN) tablet Take 2 tablets by mouth daily.      omeprazole (PRILOSEC) 40 MG capsule Take 40 mg by mouth daily.      No current facility-administered medications for this visit.    Allergies  Allergen Reactions   Beta Adrenergic Blockers Other (See Comments)    Fatigue   Oxycodone Nausea And Vomiting   Sulfonamide Derivatives Nausea And Vomiting    Review of Systems negative except from HPI and PMH  Physical Exam BP 124/80 (BP Location: Left Arm, Patient Position: Sitting, Cuff Size: Normal)    Pulse 61    Ht 5\' 6"  (1.676 m)    Wt 190 lb (86.2 kg)    BMI 30.67 kg/m  Well  developed and well nourished in no acute distress HENT normal Neck supple with JVP-flat Clear Device pocket well healed; without hematoma or erythema.  There is no tethering  Regular rate and rhythm, no   murmur Abd-soft with active BS No Clubbing cyanosis edema Skin-warm and dry A & Oriented  Grossly normal sensory and motor function  ECG AV pacing with a QRS morphology upright in lead I RS in lead V1 and QRS duration of 144 ms    Assessment and  Plan \ Ischemic  Cardiomyopathy -- interval normalization prior LAD stenting  Complete heart block    Congestive heart failure acute  Implantable defibrillator-CRT Medtronic   Atrial fibrillation paroxysmal  Bradycardia-sinus  Hypertension  HFpEF chronic  Anemia   Hyperlipidemia  The more rapid heartbeats have continued. Device evaluation today suggest that they may be PMT. Given that he is atrially and ventricularly lead dependent, we will reprogram him DDIR. It could also be an atrial tachycardia that are tract. This programming will also obviate that.  Heart failure significantly improved on the increased level of diuretics. Back to baseline. We will have him follow-up with the ICM clinic  Hemoglobin was most recently down; will have follow-up with his PCP to recheck.  At next visit can consider electrocardiographic optimization of CRT by changing LV offset

## 2020-10-01 ENCOUNTER — Ambulatory Visit (INDEPENDENT_AMBULATORY_CARE_PROVIDER_SITE_OTHER): Payer: Medicare Other | Admitting: Internal Medicine

## 2020-10-01 ENCOUNTER — Other Ambulatory Visit: Payer: Self-pay

## 2020-10-01 ENCOUNTER — Encounter: Payer: Self-pay | Admitting: Internal Medicine

## 2020-10-01 VITALS — BP 124/80 | HR 61 | Ht 66.0 in | Wt 190.0 lb

## 2020-10-01 DIAGNOSIS — I459 Conduction disorder, unspecified: Secondary | ICD-10-CM | POA: Diagnosis not present

## 2020-10-01 DIAGNOSIS — I255 Ischemic cardiomyopathy: Secondary | ICD-10-CM

## 2020-10-01 DIAGNOSIS — I5022 Chronic systolic (congestive) heart failure: Secondary | ICD-10-CM | POA: Diagnosis not present

## 2020-10-01 DIAGNOSIS — Z95810 Presence of automatic (implantable) cardiac defibrillator: Secondary | ICD-10-CM | POA: Diagnosis not present

## 2020-10-01 DIAGNOSIS — I480 Paroxysmal atrial fibrillation: Secondary | ICD-10-CM

## 2020-10-01 NOTE — Patient Instructions (Signed)
Medication Instructions:   1. Your physician recommends that you continue on your current medications as directed. Please refer to the Current Medication list given to you today.  *If you need a refill on your cardiac medications before your next appointment, please call your pharmacy*   Lab Work:  1. Please follow up with your Primary Care Provider for a follow up Complete Blood Count.   If you have labs (blood work) drawn today and your tests are completely normal, you will receive your results only by: [name] MyChart Message (if you have MyChart) OR . A paper copy in the mail If you have any lab test that is abnormal or we need to change your treatment, we will call you to review the results.   Testing/Procedures:  1. None Ordered   Follow-Up: At [facility], you and your health needs are our priority.  As part of our continuing mission to provide you with exceptional heart care, we have created designated Provider Care Teams.  These Care Teams include your primary Cardiologist (physician) and Advanced Practice Providers (APPs -  Physician Assistants and Nurse Practitioners) who all work together to provide you with the care you need, when you need it.  We recommend signing up for the patient portal called "MyChart".  Sign up information is provided on this After Visit Summary.  MyChart is used to connect with patients for Virtual Visits (Telemedicine).  Patients are able to view lab/test results, encounter notes, upcoming appointments, etc.  Non-urgent messages can be sent to your provider as well.   To learn more about what you can do with MyChart, go to [email or web address].    Your next appointment:   6 month(s)  The format for your next appointment:   In Person  Provider:   You may see Virl Axe, MD     Other Instructions  1. We will refer you to the [facility] clinic.

## 2020-10-11 ENCOUNTER — Telehealth: Payer: Self-pay

## 2020-10-11 NOTE — Telephone Encounter (Signed)
Referred to [facility] clinic by Dr [name].   Spoke with patient and agreeable to monthly follow up.  He keeps device monitor by bedside.  He reports he has gained 5 lbs in last week and his leg stays indented when he pushes on the skin.  He said he thinks he is retaining fluid.  He is currently not at home and unable to send a report.  He reports Dr [name] ordered to take Furosemide 40 mg daily.  He is aware to limit salt and fluid intake.  Advised pt will discuss with Dr Olin Pia nurse and call him back.

## 2020-10-11 NOTE — Telephone Encounter (Signed)
Call back to patient and advised of Heather's recommendation to report the Furosemide 40 mg qod x 4 doses.  His current weight is 192.5 lbs.  He has been weighing at night.  Advised to weigh every morning after emptying bladder and before he eats or drinks anything.  Advised for consistency to weigh in same clothes such as pajamas.  ICM remote transmission scheduled for 10/14/2020.

## 2020-10-11 NOTE — Telephone Encounter (Signed)
-----   Message from Emily Filbert, RN sent at 10/01/2020  9:56 AM EDT ----- Hello sweet friend! Dr. [name] would like to refer this patient to the [facility] clinic.  He saw him in office today and the patient was made aware you would reach out to him. He's a nice man.

## 2020-10-11 NOTE — Telephone Encounter (Signed)
Per Dr Olin Pia nurse Alvis Lemmings, RN Dr [name] ordered [name] term dosage of Furosemide 40 mg every other day x 4 doses on 09/17/2020 and pt was to call on 10/25 to provide update on ho he was doing.  She recommended patient repeat the Furosemide 40 mg every other day x 4 doses and then will follow up with patient on Monday to see how he is doing.

## 2020-10-14 ENCOUNTER — Ambulatory Visit (INDEPENDENT_AMBULATORY_CARE_PROVIDER_SITE_OTHER): Payer: Medicare Other

## 2020-10-14 DIAGNOSIS — I5022 Chronic systolic (congestive) heart failure: Secondary | ICD-10-CM

## 2020-10-14 DIAGNOSIS — Z95810 Presence of automatic (implantable) cardiac defibrillator: Secondary | ICD-10-CM

## 2020-10-15 NOTE — Progress Notes (Signed)
EPIC Encounter for ICM Monitoring  Patient Name: [name]. is a 79 y.o. male Date: 10/15/2020 Primary Care Physican: Derinda Late, MD Primary Cardiologist: [name] Electrophysiologist: Vergie Living Pacing: 99.7%  10/15/2020 Weight: 189 lbs       1st ICM Remote Transmission.  Heart Failure questions reviewed.  Patients weight dropped 3.5 lbs since 10/22 and after taking 3 doses of Furosemide.  Discussed diet and he is not strict on salt intake but has stopped using salt shaker.  [place] and eats one meal a day but it is not low salt.    Optivol thoracic impedance improved and close to baseline normal after 3 dosages of Khaniya Tenaglia term Furosemide.   Prescribed: Furosemide 40 mg take 1 tablet qod x 4 doses (only 1 time dose).  Furosemide not prescribed as long term medication  Recommendations:  Pt will take the 4th Furosemide dosage on 10/28.  Discussed limiting salt intake to 2000 mg daily and importance of reviewing food labels for salt content.  Advised will call him back of Dr [name] has any new recommendations.    Follow-up plan: ICM clinic phone appointment on 10/21/2020 to recheck fluid levels.   91 day device clinic remote transmission 12/27/2020.    EP/Cardiology Office Visits: Recall 03/14/2021 with Dr. [name].    Copy of ICM check sent to Dr. [name].   3 month ICM trend: 10/14/2020    1 Year ICM trend:       Rosalene Billings, RN 10/15/2020 11:55 AM

## 2020-10-17 ENCOUNTER — Telehealth: Payer: Self-pay | Admitting: Internal Medicine

## 2020-10-17 NOTE — Telephone Encounter (Signed)
Ronnie Sprang, MD  10/15/2020 8:19 PM EDT     Please Inform Patient that stress test showed no evidence of ischemia, stable heart muscle function   Thanks

## 2020-10-17 NOTE — Telephone Encounter (Signed)
I attempted to call the patient at his cell # & home # to discuss stress test results and the recommendations from Dr. [name] based on his last ICM transmission with Margarita Grizzle.  No answer at either #. I did leave a detailed message of results on his identified home voice mail (ok per DPR) of the results of his stress test.  I also advised him that Dr. [name] would like him to take lasix 40 mg twice a week to try to help with his fluid.  I asked that he call back to discuss further.  I stated I would be out of the office tomorrow, but I would leave a detailed message in his chart for triage/ if he called Margarita Grizzle back I would also make her aware of MD recommendations.

## 2020-10-18 MED ORDER — FUROSEMIDE 40 MG PO TABS: ORAL_TABLET | ORAL | 3 refills | Status: DC

## 2020-10-18 NOTE — Progress Notes (Signed)
Spoke with patient.  Confirmed he received Heather's message regarding stress test and taking Furosemide 40 mg 1 tablet twice a week.  He has enough of supply on hand.  Advised will [facility] but will call when future refills are needed.

## 2020-10-18 NOTE — Progress Notes (Signed)
Copy of 10/17/2020 phone note from Alvis Lemmings:  I attempted to call the patient at his cell # & home # to discuss stress test results and the recommendations from Dr. [name] based on his last ICM transmission with Margarita Grizzle.  No answer at either #. I did leave a detailed message of results on his identified home voice mail (ok per DPR) of the results of his stress test.  I also advised him that Dr. [name] would like him to take lasix 40 mg twice a week to try to help with his fluid.  I asked that he call back to discuss further.  I stated I would be out of the office tomorrow, but I would leave a detailed message in his chart for triage/ if he called Margarita Grizzle back I would also make her aware of MD recommendations.

## 2020-10-21 ENCOUNTER — Ambulatory Visit (INDEPENDENT_AMBULATORY_CARE_PROVIDER_SITE_OTHER): Payer: Medicare Other

## 2020-10-21 DIAGNOSIS — I5022 Chronic systolic (congestive) heart failure: Secondary | ICD-10-CM

## 2020-10-21 DIAGNOSIS — Z95810 Presence of automatic (implantable) cardiac defibrillator: Secondary | ICD-10-CM

## 2020-10-21 NOTE — Telephone Encounter (Signed)
Message received from Sharman Cheek, RN (ICM clinic) from 10/18/20:  Spoke with patient.  Confirmed he received Keanu Frickey's message regarding stress test and taking Furosemide 40 mg 1 tablet twice a week.  He has enough of supply on hand.  Advised will [facility] but will call when future refills are needed.

## 2020-10-22 ENCOUNTER — Telehealth: Payer: Self-pay

## 2020-10-22 NOTE — Progress Notes (Signed)
EPIC Encounter for ICM Monitoring  Patient Name: [name]. is a 79 y.o. male Date: 10/22/2020 Primary Care Physican: Derinda Late, MD Primary Cardiologist: [name] Electrophysiologist: Vergie Living Pacing: 99.7%         10/15/2020 Weight: 189 lbs                                                           Attempted call to patient and unable to reach.  Left detailed message per DPR regarding transmission. Transmission reviewed.    Optivol thoracic impedance returned to normal after starting maintenance Furosemide dosage twice a week.   Prescribed: Furosemide 40 mg take 1 tablet twice a week.  Recommendations: Left voice mail with ICM number and encouraged to call if experiencing any fluid symptoms.   Follow-up plan: ICM clinic phone appointment on 11/25/2020.   91 day device clinic remote transmission 12/27/2020.    EP/Cardiology Office Visits: Recall 03/14/2021 with Dr. [name].    Copy of ICM check sent to Dr. [name].   3 month ICM trend: 10/21/2020    1 Year ICM trend:       Rosalene Billings, RN 10/22/2020 12:28 PM

## 2020-10-22 NOTE — Telephone Encounter (Signed)
Remote ICM transmission received.  Attempted call to patient regarding ICM remote transmission and left detailed message per DPR.  Advised to return call for any fluid symptoms or questions.  

## 2020-10-29 ENCOUNTER — Other Ambulatory Visit: Payer: Self-pay | Admitting: Otolaryngology

## 2020-10-29 DIAGNOSIS — R633 Feeding difficulties, unspecified: Secondary | ICD-10-CM

## 2020-10-29 DIAGNOSIS — R55 Syncope and collapse: Secondary | ICD-10-CM

## 2020-10-29 DIAGNOSIS — R059 Cough, unspecified: Secondary | ICD-10-CM

## 2020-10-29 DIAGNOSIS — R1310 Dysphagia, unspecified: Secondary | ICD-10-CM

## 2020-10-29 DIAGNOSIS — R1314 Dysphagia, pharyngoesophageal phase: Secondary | ICD-10-CM

## 2020-10-29 DIAGNOSIS — R490 Dysphonia: Secondary | ICD-10-CM

## 2020-11-06 ENCOUNTER — Ambulatory Visit
Admission: RE | Admit: 2020-11-06 | Discharge: 2020-11-06 | Disposition: A | Discharge status: Home / Self Care | Payer: Medicare Other | Source: Ambulatory Visit | Attending: Otolaryngology | Admitting: Otolaryngology

## 2020-11-06 ENCOUNTER — Other Ambulatory Visit: Payer: Self-pay

## 2020-11-06 DIAGNOSIS — R059 Cough, unspecified: Secondary | ICD-10-CM | POA: Diagnosis present

## 2020-11-06 DIAGNOSIS — R490 Dysphonia: Secondary | ICD-10-CM | POA: Insufficient documentation

## 2020-11-06 DIAGNOSIS — R1310 Dysphagia, unspecified: Secondary | ICD-10-CM | POA: Insufficient documentation

## 2020-11-06 NOTE — Therapy (Signed)
[facility] [place], [place], [address] Phone: [phone]   Fax:     Modified Barium Swallow  Patient Details  Name: [name]. MRN: [id number] Date of Birth: 06-22-1941 No data recorded  Encounter Date: 11/06/2020   End of Session - 11/06/20 1355    Visit Number 1    Number of Visits 1    Date for SLP Re-Evaluation 11/06/20    SLP Start Time 1300    SLP Stop Time  1345    SLP Time Calculation (min) 45 min    Activity Tolerance Patient tolerated treatment well           Past Medical History:  Diagnosis Date  . AICD (automatic cardioverter/defibrillator) present   . Allergic rhinitis   . Arthritis   . Asthma   . Biventricular defibrillator-Medtronic    Generator replacement 2012 with CRT upgrade  . BPH (benign prostatic hyperplasia)   . Cardiac arrest ([place])   . Chronic lower back pain   . Diabetes mellitus without complication ([place])    BORDER LINE  . Dyspnea    PFT 12/19/10: FEV1 2.93(107%), FEV1% 72, TLC 8.02(138%), DLCO 110%, +BD  . ED (erectile dysfunction)   . GERD (gastroesophageal reflux disease)   . GERD (gastroesophageal reflux disease)   . High-grade heart block   . History of kidney stones   . Hyperlipidemia   . Hypertension   . 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 Myocardial infarction ([place])   . Sciatic nerve pain   . Severe sinus bradycardia   . Syncope       . TIA (transient ischemic attack) nov. 2011    Past Surgical History:  Procedure Laterality Date  . CATARACT EXTRACTION, BILATERAL    . COLONOSCOPY WITH PROPOFOL N/A 04/01/2018   Procedure: COLONOSCOPY WITH PROPOFOL;  Surgeon: [name], MD;  Location: [facility] ENDOSCOPY;  Service: Endoscopy;  Laterality: N/A;  . CORONARY ANGIOPLASTY    . CYSTOSCOPY WITH INSERTION OF UROLIFT N/A 07/15/2020    Procedure: CYSTOSCOPY WITH INSERTION OF UROLIFT;  Surgeon: [name], MD;  Location: [facility] ORS;  Service: Urology;  Laterality: N/A;  . CYSTOSCOPY WITH LITHOLAPAXY N/A 07/15/2020   Procedure: CYSTOSCOPY WITH LITHOLAPAXY;  Surgeon: [name], MD;  Location: [facility] ORS;  Service: Urology;  Laterality: N/A;  . EP IMPLANTABLE DEVICE N/A 03/18/2016   Procedure:  BiV ICD Generator Changeout;  Surgeon: [name], MD;  Location: [facility];  Service: Cardiovascular;  Laterality: N/A;  . ESOPHAGOGASTRODUODENOSCOPY (EGD) WITH PROPOFOL N/A 04/01/2018   Procedure: ESOPHAGOGASTRODUODENOSCOPY (EGD) WITH PROPOFOL;  Surgeon: [name], MD;  Location: [facility] ENDOSCOPY;  Service: Endoscopy;  Laterality: N/A;  . EYE SURGERY Bilateral    cataract extraction  . Implantation of Medtronic dual-chamber cardioverter    . JOINT REPLACEMENT Left    total knee replacement  . Left Knee Replacement  2006  . PROSTATE BIOPSY    . TONSILLECTOMY  1948  . TOTAL KNEE REVISION Left 06/20/2015   Procedure: TOTAL KNEE REVISION;  Surgeon: [name], MD;  Location: [facility] ORS;  Service: Orthopedics;  Laterality: Left;    There were no vitals filed for this visit.   Subjective: Patient behavior: (alertness, ability to follow instructions, etc.): The patient is alert, able to verbalize his swallowing complaints, and follow directions.   Chief complaint: reflux  of secretions during laryngoscopy   Objective:  Radiological Procedure: A videoflouroscopic evaluation of oral-preparatory, reflex initiation, and pharyngeal phases of the swallow was performed; as well as a screening of the upper esophageal phase.  I. POSTURE: Upright in MBS chair  II. VIEW: Lateral  III. COMPENSATORY STRATEGIES: N/A  IV. BOLUSES ADMINISTERED:   Thin Liquid: 1 small, 4 small rapid consecutive, 2 large rapid consecutive   Nectar-thick Liquid: 2 moderate rapid consecutive   Honey-thick Liquid: DNT   Puree: 1 teaspoon, 1  heaping teaspoon   Mechanical Soft: 1/4 graham cracker in applesauce   V. RESULTS OF EVALUATION: A. ORAL PREPARATORY PHASE: (The lips, tongue, and velum are observed for strength and coordination)       **Overall Severity Rating: Within normal limits  B. SWALLOW INITIATION/REFLEX: (The reflex is normal if "triggered" by the time the bolus reached the base of the tongue)  **Overall Severity Rating: Mild; triggers while falling from the valleculae to the pyriform sinuses  C. PHARYNGEAL PHASE: (Pharyngeal function is normal if the bolus shows rapid, smooth, and continuous transit through the pharynx and there is no pharyngeal residue after the swallow)  **Overall Severity Rating: Within normal limits  D. LARYNGEAL PENETRATION: (Material entering into the laryngeal inlet/vestibule but not aspirated) None  E. ASPIRATION: None  F. ESOPHAGEAL PHASE: (Screening of the upper esophagus) No observed abnormality within the viewable cervical esophagus, no observed backflow through the UES  ASSESSMENT: This 79 year old man; with reflux of secretions during laryngoscopy; is presenting with minimal oropharyngeal dysphagia characterized by delayed pharyngeal swallow initiation.  Oral control of the bolus including oral hold, rotary mastication, and anterior to posterior transfer is within functional limits.   Aspects of the pharyngeal stage of swallowing including tongue base retraction, hyolaryngeal excursion, epiglottic inversion, and duration/amplitude of UES opening are within normal limits.  There is no observed pharyngeal residue, laryngeal penetration, tracheal aspiration, or esophagus-to-pharynx backflow.   Delayed pharyngeal swallow initiation is consistent with effects of laryngopharyngeal reflux (inflammation, edema, and resultant decreased sensation of the larynx and pharynx).    PLAN/RECOMMENDATIONS:   A. Diet: Regular   B. Swallowing Precautions: No special precautions indicated by this  study   C. Recommended consultation to: follow up with MDs as recommended   D. Therapy recommendations: speech therapy is not indicated for swallowing   E. Results and recommendations were discussed with the patient immediately following the study and the final report routed to the referring MD.  Dysphagia, unspecified type - Plan: DG SWALLOW FUNC OP MEDICARE SPEECH PATH, DG SWALLOW FUNC OP MEDICARE SPEECH PATH  Cough - Plan: DG SWALLOW FUNC OP [place], DG SWALLOW FUNC OP MEDICARE SPEECH PATH  Dysphonia - Plan: DG SWALLOW FUNC OP MEDICARE SPEECH PATH, DG SWALLOW FUNC OP MEDICARE SPEECH PATH        Problem List Patient Active Problem List   Diagnosis Date Noted  . Arthritis of carpometacarpal (CMC) joint of thumb 12/24/2017  . Acquired bladder diverticulum 03/25/2017  . Bladder calculus 03/25/2017  . Gross hematuria 03/02/2017  . Nephrolithiasis 09/01/2016  . Diabetes mellitus without complication ([place]) 24/23/5361  . 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 Asthma 08/08/2014  . Osteoarthritis 08/08/2014  . Lumbar stenosis with neurogenic claudication 07/03/2014  . CAD (coronary artery disease), native coronary artery 05/31/2014  . DDD (degenerative disc disease), lumbar 05/31/2014  . Esophageal reflux 05/31/2014  . Thoracic or lumbosacral neuritis or radiculitis 05/31/2014  . ED (erectile  dysfunction) of organic origin 09/21/2012  . Elevated prostate specific antigen (PSA) 09/21/2012  . Encysted hydrocele 09/21/2012  . Family history of malignant neoplasm of prostate 09/21/2012  . Incomplete emptying of bladder 09/21/2012  . Nodular prostate with urinary obstruction 09/21/2012  . Hyperlipidemia 08/25/2012  . Atrial fibrillation ([place]) 08/25/2012  . Biventricular cardioverter-defibrillator-Medtronic   . Severe sinus bradycardia   . High-grade heart block   . SHORTNESS OF BREATH 01/16/2011  . SYSTOLIC HEART  FAILURE, CHRONIC 10/14/2010  . HYPERTENSION, BENIGN 10/15/2009  . Cardiomyopathy, ischemic 10/15/2009  . Chronic ischemic heart disease 10/15/2009   Leroy Sea, MS/CCC- SLP  Lou Miner 11/06/2020, 1:56 PM  [facility] [place], [place], [address] Phone: [phone]   Fax:     Name: [name]. MRN: [id number] Date of Birth: November 27, 1941

## 2020-11-25 ENCOUNTER — Ambulatory Visit (INDEPENDENT_AMBULATORY_CARE_PROVIDER_SITE_OTHER): Payer: Medicare Other

## 2020-11-25 DIAGNOSIS — Z95810 Presence of automatic (implantable) cardiac defibrillator: Secondary | ICD-10-CM

## 2020-11-25 DIAGNOSIS — I5022 Chronic systolic (congestive) heart failure: Secondary | ICD-10-CM

## 2020-11-26 NOTE — Progress Notes (Signed)
EPIC Encounter for ICM Monitoring  Patient Name: Caidin Heidenreich. is a 79 y.o. male Date: 11/26/2020 Primary Care Physican: Derinda Late, MD Primary Cardiologist:Klein Electrophysiologist:Klein Bi-V Pacing:99.9% 11/26/2020 Weight:187lbs   Spoke with patient and reports feeling well at this time.  Denies fluid symptoms.  He weighs routinely twice a day always wearing same type of clothing.    Optivol thoracic impedance normal fluid levels.   Prescribed: Furosemide40 mg take 1 tablet twice a week.  Recommendations: No changes and encouraged to call if experiencing any fluid symptoms.  Follow-up plan: ICM clinic phone appointment on1/09/2021. 91 day device clinic remote transmission 12/27/2020.   EP/Cardiology Office Visits:Recall 3/25/2022with Dr. [name].   Copy of ICM check sent to [facility].   3 month ICM trend: 11/25/2020    1 Year ICM trend:       Rosalene Billings, RN 11/26/2020 4:42 PM

## 2020-11-27 ENCOUNTER — Other Ambulatory Visit: Payer: Self-pay | Admitting: Internal Medicine

## 2020-12-25 ENCOUNTER — Ambulatory Visit (INDEPENDENT_AMBULATORY_CARE_PROVIDER_SITE_OTHER): Payer: Medicare Other | Admitting: Urology

## 2020-12-25 ENCOUNTER — Encounter: Payer: Self-pay | Admitting: Urology

## 2020-12-25 ENCOUNTER — Other Ambulatory Visit: Payer: Self-pay

## 2020-12-25 VITALS — BP 118/75 | HR 68

## 2020-12-25 DIAGNOSIS — N200 Calculus of kidney: Secondary | ICD-10-CM | POA: Diagnosis not present

## 2020-12-25 DIAGNOSIS — N400 Enlarged prostate without lower urinary tract symptoms: Secondary | ICD-10-CM | POA: Diagnosis not present

## 2020-12-25 LAB — BLADDER SCAN AMB NON-IMAGING: Scan Result: 12

## 2020-12-25 NOTE — Progress Notes (Signed)
12/25/2020 3:34 PM   Ronnie Riggers Jr. [birth year] [id number]  Referring provider: Kandyce Rud, MD [phone] [address] [name] [place],  [place] [address]  Chief Complaint  Patient presents with  . Benign Prostatic Hypertrophy    HPI: 80 year old male with a personal history of nephrolithiasis and BPH bladder stones status post UroLift/cystolitholapaxy who returns today for 81-month follow-up.  We will initially plan for holep however he is unable to come off blood thinners for this thus he underwent UroLift as an alternative.  He remains on Avodart.  Today, he reports his symptoms are dramatically improved.  He has a better stream and feels he is able to empty better.  He continues to have some urgency frequency which can be bothersome.  He is recently started on Lasix which he takes every third day.  His urgency frequency is exacerbated with this by also unrelated including days which he does not take this medication.  No dysuria or gross hematuria.   IPSS    Row Name 12/25/20 1300         International Prostate Symptom Score   How often have you had the sensation of not emptying your bladder? Less than 1 in 5     How often have you had to urinate less than every two hours? Less than half the time     How often have you found you stopped and started again several times when you urinated? About half the time     How often have you found it difficult to postpone urination? Less than 1 in 5 times     How often have you had a weak urinary stream? Less than half the time     How often have you had to strain to start urination? Less than 1 in 5 times     How many times did you typically get up at night to urinate? 2 Times     Total IPSS Score 12           Quality of Life due to urinary symptoms   If you were to spend the rest of your life with your urinary condition just the way it is now how would you feel about that? Mostly  Satisfied            Score:  1-7 Mild 8-19 Moderate 20-35 Severe    PMH: Past Medical History:  Diagnosis Date  . AICD (automatic cardioverter/defibrillator) present   . Allergic rhinitis   . Arthritis   . Asthma   . Biventricular defibrillator-Medtronic    Generator replacement 2012 with CRT upgrade  . BPH (benign prostatic hyperplasia)   . Cardiac arrest ([facility])   . Chronic lower back pain   . Diabetes mellitus without complication ([facility])    BORDER LINE  . Dyspnea    PFT 12/19/10: FEV1 2.93(107%), FEV1% 72, TLC 8.02(138%), DLCO 110%, +BD  . ED (erectile dysfunction)   . GERD (gastroesophageal reflux disease)   . GERD (gastroesophageal reflux disease)   . High-grade heart block   . History of kidney stones   . Hyperlipidemia   . Hypertension   . 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 Myocardial infarction ([facility])   . Sciatic nerve pain   . Severe sinus bradycardia   . Syncope       . TIA (transient ischemic attack) nov.  2011    Surgical History: Past Surgical History:  Procedure Laterality Date  . CATARACT EXTRACTION, BILATERAL    . COLONOSCOPY WITH PROPOFOL N/A 04/01/2018   Procedure: COLONOSCOPY WITH PROPOFOL;  Surgeon: [name], MD;  Location: [facility] ENDOSCOPY;  Service: Endoscopy;  Laterality: N/A;  . CORONARY ANGIOPLASTY    . CYSTOSCOPY WITH INSERTION OF UROLIFT N/A 07/15/2020   Procedure: CYSTOSCOPY WITH INSERTION OF UROLIFT;  Surgeon: [name], MD;  Location: [facility] ORS;  Service: Urology;  Laterality: N/A;  . CYSTOSCOPY WITH LITHOLAPAXY N/A 07/15/2020   Procedure: CYSTOSCOPY WITH LITHOLAPAXY;  Surgeon: [name], MD;  Location: [facility] ORS;  Service: Urology;  Laterality: N/A;  . EP IMPLANTABLE DEVICE N/A 03/18/2016   Procedure:  BiV ICD Generator Changeout;  Surgeon: [name], MD;  Location: [facility];  Service:  Cardiovascular;  Laterality: N/A;  . ESOPHAGOGASTRODUODENOSCOPY (EGD) WITH PROPOFOL N/A 04/01/2018   Procedure: ESOPHAGOGASTRODUODENOSCOPY (EGD) WITH PROPOFOL;  Surgeon: [name], MD;  Location: [facility] ENDOSCOPY;  Service: Endoscopy;  Laterality: N/A;  . EYE SURGERY Bilateral    cataract extraction  . Implantation of Medtronic dual-chamber cardioverter    . JOINT REPLACEMENT Left    total knee replacement  . Left Knee Replacement  2006  . PROSTATE BIOPSY    . TONSILLECTOMY  1948  . TOTAL KNEE REVISION Left 06/20/2015   Procedure: TOTAL KNEE REVISION;  Surgeon: [name], MD;  Location: [facility] ORS;  Service: Orthopedics;  Laterality: Left;    Home Medications:  Allergies as of 12/25/2020      Reactions   Beta Adrenergic Blockers Other (See Comments)   Fatigue   Oxycodone Nausea And Vomiting   Sulfonamide Derivatives Nausea And Vomiting      Medication List       Accurate as of December 25, 2020 11:59 PM. If you have any questions, ask your nurse or doctor.        acetaminophen 500 MG tablet Commonly known as: TYLENOL Take 500 mg by mouth 2 (two) times daily.   albuterol 108 (90 Base) MCG/ACT inhaler Commonly known as: VENTOLIN HFA Inhale 1-2 puffs into the lungs every 6 (six) hours as needed for wheezing or shortness of breath.   apixaban 5 MG Tabs tablet Commonly known as: ELIQUIS Take 5 mg by mouth 2 (two) times daily.   atorvastatin 80 MG tablet Commonly known as: LIPITOR Take 1 tablet (80 mg total) by mouth daily.   bisoprolol 5 MG tablet Commonly known as: ZEBETA Take 1 tablet (5 mg total) by mouth daily.   CITRACAL + D PO Take 2 tablets by mouth daily.   dorzolamide 2 % ophthalmic solution Commonly known as: TRUSOPT 1 drop 2 (two) times daily.   dutasteride 0.5 MG capsule Commonly known as: AVODART Take 0.5 mg by mouth every 3 (three) days.   fluconazole 200 MG tablet Commonly known as: DIFLUCAN Take 200 mg by mouth once a week.   furosemide 40  MG tablet Commonly known as: LASIX TAKE 1 TABLET BY MOUTH EVERY OTHER DAY AS DIRECTED   glipiZIDE 5 MG 24 hr tablet Commonly known as: GLUCOTROL XL Take 5 mg by mouth daily.   latanoprost 0.005 % ophthalmic solution Commonly known as: XALATAN Place 1 drop into the right eye at bedtime.   losartan 50 MG tablet Commonly known as: COZAAR Take 50 mg by mouth daily.   Melatonin 10 MG Tabs Take 10 mg by mouth at bedtime.   metFORMIN 500 MG  24 hr tablet Commonly known as: GLUCOPHAGE-XR Take 500 mg by mouth in the morning and at bedtime.   montelukast 10 MG tablet Commonly known as: SINGULAIR Take 10 mg by mouth daily.   multivitamin tablet Take 2 tablets by mouth daily.   omeprazole 40 MG capsule Commonly known as: PRILOSEC Take 40 mg by mouth daily.       Allergies:  Allergies  Allergen Reactions  . Beta Adrenergic Blockers Other (See Comments)    Fatigue  . Oxycodone Nausea And Vomiting  . Sulfonamide Derivatives Nausea And Vomiting    Family History: Family History  Problem Relation Age of Onset  . Emphysema Father   . Lung cancer Father   . Cirrhosis Mother   . Hypertension Brother   . Hypertension Son   . Heart attack Neg Hx   . Stroke Neg Hx     Social History:  reports that he has never smoked. He has never used smokeless tobacco. He reports current alcohol use. He reports that he does not use drugs.   Physical Exam: BP 118/75   Pulse 68   Constitutional:  Alert and oriented, No acute distress. HEENT: [place] AT, moist mucus membranes.  Trachea midline, no masses. Cardiovascular: No clubbing, cyanosis, or edema. Respiratory: Normal respiratory effort, no increased work of breathing. Skin: No rashes, bruises or suspicious lesions. Neurologic: Grossly intact, no focal deficits, moving all 4 extremities. Psychiatric: Normal mood and affect.  Laboratory Data: Results for orders placed or performed in visit on 12/25/20  Bladder Scan (Post Void Residual)  in office  Result Value Ref Range   Scan Result 12     Assessment & Plan:    1. BPH without urinary obstruction Status post UroLift with cystolitholapaxy with improvement in his obstructive urinary symptoms  Continues to have some urinary urgency and frequency-this may improve with time with bladder retraining.  We discussed the option of adding anticholinergic or beta 3 agonist at least in the short-term to help him with these symptoms now that his outlet is been addressed.  We discussed possible side effects.  He believes he is on so much medication at this point time that he take to start a new medication prefer to abstain.  He will return sooner if his urinary symptoms worsen.  We will have him continue his Avodart which he is only taking every third day at this point in time.  Reassess in 1 year with IPSS/PVR. - Bladder Scan (Post Void Residual) in office  2. Kidney stone\ Personal history of kidney stones, will monitor with KUB in 1 year  Encouraged hydration as possible with his history of CHF -1 view (KUB); Future  Follow-up in 1 year with IPSS/PVR/KUB  [name], MD  [facility] [address], [place], [place] [address] [phone]

## 2020-12-27 ENCOUNTER — Ambulatory Visit (INDEPENDENT_AMBULATORY_CARE_PROVIDER_SITE_OTHER): Payer: Medicare Other

## 2020-12-27 DIAGNOSIS — I255 Ischemic cardiomyopathy: Secondary | ICD-10-CM | POA: Diagnosis not present

## 2020-12-27 LAB — CUP PACEART REMOTE DEVICE CHECK
Battery Remaining Longevity: 18 mo
Battery Voltage: 2.91 V
Brady Statistic AP VP Percent: 97.83 %
Brady Statistic AP VS Percent: 0 %
Brady Statistic AS VP Percent: 2.15 %
Brady Statistic AS VS Percent: 0.01 %
Brady Statistic RA Percent Paced: 97.58 %
Brady Statistic RV Percent Paced: 99.86 %
Date Time Interrogation Session: 20220107033625
HighPow Impedance: 50 Ohm
HighPow Impedance: 61 Ohm
Implantable Lead Implant Date: 20070615
Implantable Lead Implant Date: 20120813
Implantable Lead Implant Date: 20120813
Implantable Lead Location: 753858
Implantable Lead Location: 753859
Implantable Lead Location: 753860
Implantable Lead Model: 4396
Implantable Lead Model: 5076
Implantable Lead Model: 7121
Implantable Pulse Generator Implant Date: 20170329
Lead Channel Impedance Value: 247 Ohm
Lead Channel Impedance Value: 342 Ohm
Lead Channel Impedance Value: 361 Ohm
Lead Channel Impedance Value: 456 Ohm
Lead Channel Impedance Value: 551 Ohm
Lead Channel Impedance Value: 836 Ohm
Lead Channel Pacing Threshold Amplitude: 1.125 V
Lead Channel Pacing Threshold Pulse Width: 0.4 ms
Lead Channel Sensing Intrinsic Amplitude: 0.5 mV
Lead Channel Sensing Intrinsic Amplitude: 0.5 mV
Lead Channel Sensing Intrinsic Amplitude: 6.25 mV
Lead Channel Sensing Intrinsic Amplitude: 6.25 mV
Lead Channel Setting Pacing Amplitude: 1.75 V
Lead Channel Setting Pacing Amplitude: 2 V
Lead Channel Setting Pacing Amplitude: 2.5 V
Lead Channel Setting Pacing Pulse Width: 0.4 ms
Lead Channel Setting Pacing Pulse Width: 0.8 ms
Lead Channel Setting Sensing Sensitivity: 0.3 mV

## 2020-12-30 ENCOUNTER — Ambulatory Visit (INDEPENDENT_AMBULATORY_CARE_PROVIDER_SITE_OTHER): Payer: Medicare Other

## 2020-12-30 DIAGNOSIS — Z95810 Presence of automatic (implantable) cardiac defibrillator: Secondary | ICD-10-CM | POA: Diagnosis not present

## 2020-12-30 DIAGNOSIS — I5022 Chronic systolic (congestive) heart failure: Secondary | ICD-10-CM | POA: Diagnosis not present

## 2021-01-01 NOTE — Progress Notes (Signed)
EPIC Encounter for ICM Monitoring  Patient Name: [name]. is a 80 y.o. male Date: 01/01/2021 Primary Care Physican: Derinda Late, MD Primary Cardiologist:Klein Electrophysiologist:Klein Bi-V Pacing:99.9% 01/01/2021 Weight:187lbs  Spoke with patient and reports feeling well at this time.  Denies fluid symptoms.    Optivol thoracic impedance normal fluid levels.   Prescribed: Furosemide40 mg take 1 tablettwice a week.  Recommendations:No changes and encouraged to call if experiencing any fluid symptoms.  Follow-up plan: ICM clinic phone appointment on2/21/2022. 91 day device clinic remote transmission 03/28/2021.   EP/Cardiology Office Visits:Recall 3/25/2022with Dr. [name].   Copy of ICM check sent to [facility].  3 month ICM trend: 12/30/2020.    1 Year ICM trend:       Rosalene Billings, RN 01/01/2021 1:19 PM

## 2021-01-07 ENCOUNTER — Ambulatory Visit: Payer: Medicare Other | Admitting: Gastroenterology

## 2021-01-10 NOTE — Progress Notes (Signed)
Remote ICD transmission.   

## 2021-01-22 ENCOUNTER — Other Ambulatory Visit: Payer: Self-pay | Admitting: Internal Medicine

## 2021-01-22 NOTE — Telephone Encounter (Signed)
Rx request sent to pharmacy.  

## 2021-01-22 NOTE — Telephone Encounter (Signed)
This is a [place] pt 

## 2021-02-03 ENCOUNTER — Other Ambulatory Visit
Admission: RE | Admit: 2021-02-03 | Discharge: 2021-02-03 | Disposition: A | Discharge status: Home / Self Care | Payer: Medicare Other | Source: Ambulatory Visit | Attending: Student | Admitting: Student

## 2021-02-03 DIAGNOSIS — R202 Paresthesia of skin: Secondary | ICD-10-CM | POA: Insufficient documentation

## 2021-02-03 DIAGNOSIS — M7989 Other specified soft tissue disorders: Secondary | ICD-10-CM | POA: Insufficient documentation

## 2021-02-03 DIAGNOSIS — R200 Anesthesia of skin: Secondary | ICD-10-CM | POA: Insufficient documentation

## 2021-02-03 LAB — FIBRIN DERIVATIVES D-DIMER (ARMC ONLY): Fibrin derivatives D-dimer (ARMC): 1050.39 ng/mL (FEU) — ABNORMAL HIGH (ref 0.00–499.00)

## 2021-02-05 ENCOUNTER — Ambulatory Visit (INDEPENDENT_AMBULATORY_CARE_PROVIDER_SITE_OTHER): Payer: Medicare Other | Admitting: Gastroenterology

## 2021-02-05 ENCOUNTER — Encounter: Payer: Self-pay | Admitting: Gastroenterology

## 2021-02-05 ENCOUNTER — Other Ambulatory Visit: Payer: Self-pay

## 2021-02-05 VITALS — BP 138/78 | HR 72 | Temp 97.5°F | Ht 66.0 in | Wt 190.2 lb

## 2021-02-05 DIAGNOSIS — K219 Gastro-esophageal reflux disease without esophagitis: Secondary | ICD-10-CM

## 2021-02-05 DIAGNOSIS — I255 Ischemic cardiomyopathy: Secondary | ICD-10-CM | POA: Diagnosis not present

## 2021-02-05 MED ORDER — DEXLANSOPRAZOLE 60 MG PO CPDR: 60.0000 mg | DELAYED_RELEASE_CAPSULE | Freq: Every day | ORAL | 6 refills | Status: DC

## 2021-02-05 NOTE — Progress Notes (Signed)
Gastroenterology Consultation  Referring Provider:     Carloyn Manner, MD Primary Care Physician:  Derinda Late, MD Primary Gastroenterologist:  Dr. Allen Norris     Reason for Consultation:     Reflux        HPI:   Ronnie Kirby. is a 80 y.o. y/o male referred for consultation & management of reflux by Dr. Derinda Late, MD.  This patient comes in today after being seen by Dr. Vira Agar in the past and had an EGD and colonoscopy back in 2019.  At the time of the EGD the patient had esophagitis reported on the report and 2 cecal polyps that were removed.  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  The patient had a modified barium swallow and that was done at the end of last year and it showed:  ASSESSMENT: This 80 year old man; with reflux of secretions during laryngoscopy; is presenting with minimal oropharyngeal dysphagia characterized by delayed pharyngeal swallow initiation.  Oral control of the bolus including oral hold, rotary mastication, and anterior to posterior transfer is within functional limits.   Aspects of the pharyngeal stage of swallowing including tongue base retraction, hyolaryngeal excursion, epiglottic inversion, and duration/amplitude of UES opening are within normal limits.  There is no observed pharyngeal residue, laryngeal penetration, tracheal aspiration, or esophagus-to-pharynx backflow.   Delayed pharyngeal swallow initiation is consistent with effects of laryngopharyngeal reflux (inflammation, edema, and resultant decreased sensation of the larynx and pharynx).    The patient was sent to me for further evaluation and treatment of his reflux.  The patient reports that he feels like there is mucus in the back of his throat.  He denies ever having heartburn but was found to have esophagitis at his last EGD. He reports that he continues to have a hoarse voice.  Due to his speech pathology assessment referenced above he was sent to me for possible reflux.  Past Medical History:   Diagnosis Date  . AICD (automatic cardioverter/defibrillator) present   . Allergic rhinitis   . Arthritis   . Asthma   . Biventricular defibrillator-Medtronic    Generator replacement 2012 with CRT upgrade  . BPH (benign prostatic hyperplasia)   . Cardiac arrest ([place])   . Chronic lower back pain   . Diabetes mellitus without complication ([place])    BORDER LINE  . Dyspnea    PFT 12/19/10: FEV1 2.93(107%), FEV1% 72, TLC 8.02(138%), DLCO 110%, +BD  . ED (erectile dysfunction)   . GERD (gastroesophageal reflux disease)   . GERD (gastroesophageal reflux disease)   . High-grade heart block   . History of kidney stones   . Hyperlipidemia   . Hypertension   . 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 Myocardial infarction ([place])   . Sciatic nerve pain   . Severe sinus bradycardia   . Syncope       . TIA (transient ischemic attack) nov. 2011    Past Surgical History:  Procedure Laterality Date  . CATARACT EXTRACTION, BILATERAL    . COLONOSCOPY WITH PROPOFOL N/A 04/01/2018   Procedure: COLONOSCOPY WITH PROPOFOL;  Surgeon: [name], MD;  Location: [facility] ENDOSCOPY;  Service: Endoscopy;  Laterality: N/A;  . CORONARY ANGIOPLASTY    . CYSTOSCOPY WITH INSERTION OF UROLIFT N/A 07/15/2020   Procedure: CYSTOSCOPY WITH INSERTION OF UROLIFT;  Surgeon: [name], MD;  Location: [facility] ORS;  Service: Urology;  Laterality: N/A;  .  CYSTOSCOPY WITH LITHOLAPAXY N/A 07/15/2020   Procedure: CYSTOSCOPY WITH LITHOLAPAXY;  Surgeon: [name], MD;  Location: [facility] ORS;  Service: Urology;  Laterality: N/A;  . EP IMPLANTABLE DEVICE N/A 03/18/2016   Procedure:  BiV ICD Generator Changeout;  Surgeon: [name], MD;  Location: [place] [facility] LAB;  Service: Cardiovascular;  Laterality: N/A;  . ESOPHAGOGASTRODUODENOSCOPY (EGD) WITH PROPOFOL N/A 04/01/2018   Procedure: ESOPHAGOGASTRODUODENOSCOPY  (EGD) WITH PROPOFOL;  Surgeon: [name], MD;  Location: [facility] ENDOSCOPY;  Service: Endoscopy;  Laterality: N/A;  . EYE SURGERY Bilateral    cataract extraction  . Implantation of Medtronic dual-chamber cardioverter    . JOINT REPLACEMENT Left    total knee replacement  . Left Knee Replacement  2006  . PROSTATE BIOPSY    . TONSILLECTOMY  1948  . TOTAL KNEE REVISION Left 06/20/2015   Procedure: TOTAL KNEE REVISION;  Surgeon: [name], MD;  Location: [facility] ORS;  Service: Orthopedics;  Laterality: Left;    Prior to Admission medications   Medication Sig Start Date End Date Taking? Authorizing Provider  acetaminophen (TYLENOL) 500 MG tablet Take 500 mg by mouth 2 (two) times daily.     [provider]  albuterol (VENTOLIN HFA) 108 (90 Base) MCG/ACT inhaler Inhale 1-2 puffs into the lungs every 6 (six) hours as needed for wheezing or shortness of breath.    [provider]  apixaban (ELIQUIS) 5 MG TABS tablet Take 1 tablet (5 mg total) by mouth 2 (two) times daily. 01/22/21   [name], MD  atorvastatin (LIPITOR) 80 MG tablet Take 1 tablet (80 mg total) by mouth daily. 11/05/15   [name], MD  bisoprolol (ZEBETA) 5 MG tablet Take 1 tablet (5 mg total) by mouth daily. 11/20/14   [name], MD  Calcium Citrate-Vitamin D (CITRACAL + D PO) Take 2 tablets by mouth daily.     [provider]  dorzolamide (TRUSOPT) 2 % ophthalmic solution 1 drop 2 (two) times daily.     [provider]  dutasteride (AVODART) 0.5 MG capsule Take 0.5 mg by mouth every 3 (three) days.    [provider]  fluconazole (DIFLUCAN) 200 MG tablet Take 200 mg by mouth once a week.    [provider]  furosemide (LASIX) 40 MG tablet TAKE 1 TABLET BY MOUTH EVERY OTHER DAY AS DIRECTED 11/27/20   [name], MD  glipiZIDE (GLUCOTROL XL) 5 MG 24 hr tablet Take 5 mg by mouth daily. 02/20/20   [provider]  latanoprost (XALATAN) 0.005 %  ophthalmic solution Place 1 drop into the right eye at bedtime.  01/04/15   [provider]  losartan (COZAAR) 50 MG tablet Take 50 mg by mouth daily.    [provider]  Melatonin 10 MG TABS Take 10 mg by mouth at bedtime.     [provider]  metFORMIN (GLUCOPHAGE-XR) 500 MG 24 hr tablet Take 500 mg by mouth in the morning and at bedtime.  06/17/17   [provider]  montelukast (SINGULAIR) 10 MG tablet Take 10 mg by mouth daily.  02/10/18   [provider]  Multiple Vitamin (MULTIVITAMIN) tablet Take 2 tablets by mouth daily.    [provider]  omeprazole (PRILOSEC) 40 MG capsule Take 40 mg by mouth daily.  03/05/17   [provider]    Family History  Problem Relation Age of Onset  . Emphysema Father   . Lung cancer Father   .  Cirrhosis Mother   . Hypertension Brother   . Hypertension Son   . Heart attack Neg Hx   . Stroke Neg Hx      Social History   Tobacco Use  . Smoking status: Never Smoker  . Smokeless tobacco: Never Used  Vaping Use  . Vaping Use: Never used  Substance Use Topics  . Alcohol use: Yes    Comment: 1 GLASS LIQUOR PER DAY  . Drug use: No    Allergies as of 02/05/2021 - Review Complete 12/25/2020  Allergen Reaction Noted  . Beta adrenergic blockers Other (See Comments) 08/24/2014  . Oxycodone Nausea And Vomiting 08/20/2011  . Sulfonamide derivatives Nausea And Vomiting     Review of Systems:    All systems reviewed and negative except where noted in HPI.   Physical Exam:  There were no vitals taken for this visit. No LMP for male patient. General:   Alert,  Well-developed, well-nourished, pleasant and cooperative in NAD Head:  Normocephalic and atraumatic. Eyes:  Sclera clear, no icterus.   Conjunctiva pink. Ears:  Normal auditory acuity. Neck:  Supple; no masses or thyromegaly. Lungs:  Respirations even and unlabored.  Clear throughout to auscultation.   No wheezes, crackles, or  rhonchi. No acute distress. Heart:  Regular rate and rhythm; no murmurs, clicks, rubs, or gallops. Abdomen:  Normal bowel sounds.  No bruits.  Soft, non-tender and non-distended without masses, hepatosplenomegaly or hernias noted.  No guarding or rebound tenderness.  Negative Carnett sign.   Rectal:  Deferred.  Pulses:  Normal pulses noted. Extremities:  No clubbing or edema.  No cyanosis. Neurologic:  Alert and oriented x3;  grossly normal neurologically. Skin:  Intact without significant lesions or rashes.  No jaundice. Lymph Nodes:  No significant cervical adenopathy. Psych:  Alert and cooperative. Normal mood and affect.  Imaging Studies: No results found.  Assessment and Plan:   Ronnie Aime. is a 80 y.o. y/o male who comes in with a history of esophagitis at his last EGD.  The patient now has a lot of mucus in the back of his throat and hoarseness.  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  The patient also reports that he does not have any overt heartburn symptoms.  The patient has been told that as we older the sensitivity of the esophagus may not correlate with the amount of acid reflux he is having.  The patient will be switched from omeprazole 40 mg a day to Dexilant 60 mg a day.  The patient has been told that if this does not help his symptoms then a 24-hour pH probe may be needed.  The patient has been explained the plan and agrees with it.    Lucilla Lame, MD. Marval Regal    Note: This dictation was prepared with Dragon dictation along with smaller phrase technology. Any transcriptional errors that result from this process are unintentional.

## 2021-02-10 ENCOUNTER — Ambulatory Visit (INDEPENDENT_AMBULATORY_CARE_PROVIDER_SITE_OTHER): Payer: Medicare Other

## 2021-02-10 DIAGNOSIS — Z95810 Presence of automatic (implantable) cardiac defibrillator: Secondary | ICD-10-CM | POA: Diagnosis not present

## 2021-02-10 DIAGNOSIS — I5022 Chronic systolic (congestive) heart failure: Secondary | ICD-10-CM

## 2021-02-11 ENCOUNTER — Telehealth: Payer: Self-pay

## 2021-02-11 NOTE — Telephone Encounter (Signed)
Returned patient call as requested by voice mail message.  Patient wanted Dr [name] to be aware of some symptoms he has been having for past 2 weeks.  He has been experiencing numbness, tingling starting at fingers on left hand progressing to shoulder and neck x 2 weeks.  He also has shoulder joint pain and some swelling in both hands and shoulders but most of symptoms are on left extremity.  He has been to walk in emergency clinic and anything with heart has been ruled out. He said the EKG had not changed since the previous one.    X-rays of shoulder and neck shows degenerative disk disease and has follow up appointment with orthopedic tomorrow morning.    Patient knows to monitor for any further changes in condition and understands to proceed to ER if condition worsens or has other symptoms.    He ask if he should make an appointment with Dr [name] and advised he is due for yearly appointment in March.  Advised will have office call him to schedule an appointment with Dr [name].   Advised will have device clinic review 02/10/2021 [facility] report for abnormalities and send copy of note to Dr [name] as Juluis Rainier.  Advised 02/10/2021 remote transmission fluid levels are normal.

## 2021-02-12 ENCOUNTER — Telehealth: Payer: Self-pay | Admitting: Internal Medicine

## 2021-02-12 NOTE — Telephone Encounter (Signed)
Patient states the symptoms he was having has subsided since he has went to the orthopaedic doctor(he received an injection). States he has had pain and numbness in his left upper arm. Please call to discuss.

## 2021-02-12 NOTE — Progress Notes (Signed)
EPIC Encounter for ICM Monitoring  Patient Name: [name]. is a 80 y.o. male Date: 02/12/2021 Primary Care Physican: Derinda Late, MD Primary Cardiologist:Klein Electrophysiologist:Klein Bi-V Pacing:100% 01/01/2021 Weight:187lbs   OBSERVATIONS (2)  Alert: RVtip to RVcoil lead impedance warning on 31-Jan-2021.  RV Capture Management: Actual safety margin (2.2 X) > programmed margin (2 X).  Spoke with patient on 02/11/2021 (see phone note 02/11/21),  Pt evaluated this week at walk in clinic due to numbness, tingling left extremity, swelling of hand along with shoulder joint and neck pain x 2 weeks.  Clinic ruled out any changes in heart condition. He reports he has appt with orthopedic physician 2/23 due to x-ray shows degenerative disease in neck.     Optivol thoracic impedancenormal fluid levels.Phone note sent to device clinic triage to review 2/21 report for abnormalities.    Prescribed: Furosemide40 mg take 1 tablettwice a week.  Recommendations:Advised to use ER or 911 if condition worsens or additional symptoms occur.    Follow-up plan: ICM clinic phone appointment on4/03/2021. 91 day device clinic remote transmission 03/28/2021.   EP/Cardiology Office Visits:Recall 3/25/2022with Dr. [name]. Message sent to [facility] office to contact patient for recall appointment and pt would like to be seen since he is having symptoms that are being evaluated.  Copy of ICM check sent to [place].  Phone note sent to Dr [name] as Juluis Rainier.   3 month ICM trend: 02/10/2021.    1 Year ICM trend:       Rosalene Billings, RN 02/12/2021 8:30 AM

## 2021-02-12 NOTE — Telephone Encounter (Signed)
02/10/21 transmission reviewed.  Presenting rhythm AP/BiVp 60bpm.  Pt is BiV pacing 100% of the time.  Device programmed DDIR, histogram has slight bimodal appearance appears normal function compared to previous reports.  Thresholds and impedence results consistent with previous measurements.  No arrhythmias logged.  Nothing on report to explain pt symptoms.

## 2021-02-12 NOTE — Telephone Encounter (Signed)
I spoke with the patient. He states that this summer, Dr. [name] advised he has CHF and he was started on a fluid pill. At Christmas, he started to have tingling to the left fingertips, top of the left hand, wrist, & forearm.   He eventually developed right and left shoulder stiffness and this made it hard to raise his arms.   About 2 weeks ago, he went to the walk in clinic at [facility] where X-rays and some other testing were performed.  He advised that the X-rays showed disc degeneration.  2 days ago he was in so much pain that he tried to call Sharman Cheek, RN [facility]) to advise her of the situation. He was having trouble closing his hands.  He states he was seen by an orthopedists today that determined by more x-rays, that he has inflammation, but no arthritis to the upper extremities.  He was given a steroid injection in the left shoulder and wrist. This afternoon, his pain is much improved.  The patient advised that he is having bilateral upper extremity swelling (fingers, hands, and wrist). This has been going on since Christmas and he was concerned his water pill may need to be adjusted.  I advised the patient that swelling due to CHF is typically noted in the lower extremities and abdominal area. I told him I was not sure if what he was experiencing was in any way related to his inflammation and with the steroid injection if he would see any resolution of these symptoms.   I have confirmed with him that he is taking lasix 40 mg 1 tablet every 3rd day. He is due to take this tomorrow.  He denies any lower extermity/ abdominal swelling or shortness of breath.  I advised I will review the above with Dr. [name] and call him back once I have spoken with MD.  The patient advised "that is all I really want is for him to know."

## 2021-02-13 NOTE — Telephone Encounter (Signed)
Verbally reviewed the message below with Dr. [name] regarding the patient's symptoms and swelling. Per Dr. [name], he did not feel there was cardiac source of concern with his bilateral hand swelling, but he did recommend that he follow up with his PCP to evaluate for any other causes.  I have spoken with the patient and made him aware of Dr. Olin Pia recommendations and voices understanding.

## 2021-02-14 NOTE — Telephone Encounter (Signed)
Noted  

## 2021-03-24 ENCOUNTER — Ambulatory Visit (INDEPENDENT_AMBULATORY_CARE_PROVIDER_SITE_OTHER): Payer: Medicare Other

## 2021-03-24 DIAGNOSIS — Z95810 Presence of automatic (implantable) cardiac defibrillator: Secondary | ICD-10-CM

## 2021-03-24 DIAGNOSIS — I5022 Chronic systolic (congestive) heart failure: Secondary | ICD-10-CM

## 2021-03-25 NOTE — Progress Notes (Signed)
EPIC Encounter for ICM Monitoring  Patient Name: [name]. is a 80 y.o. male Date: 03/25/2021 Primary Care Physican: Derinda Late, MD Primary Cardiologist:Klein Electrophysiologist:Klein Bi-V Pacing:99.8% 4/5/2022Weight:180lbs   Spoke with patient and reports feeling well at this time.  Denies fluid symptoms.  He is having carpal tunnel surgery 03/26/2021.  Optivol thoracic impedancenormal fluid levels.    Prescribed: Furosemide40 mg take 1 tablettwice a week.  Recommendations:  No changes and encouraged to call if experiencing any fluid symptoms.  Follow-up plan: ICM clinic phone appointment on5/04/2021. 91 day device clinic remote transmission4/07/2021.   EP/Cardiology Office Visits:4/12/2022with Dr. [name].   Copy of ICM check sent to [place].  3 month ICM trend: 03/24/2021.    1 Year ICM trend:       Rosalene Billings, RN 03/25/2021 3:39 PM

## 2021-03-28 ENCOUNTER — Ambulatory Visit (INDEPENDENT_AMBULATORY_CARE_PROVIDER_SITE_OTHER): Payer: Medicare Other

## 2021-03-28 DIAGNOSIS — I459 Conduction disorder, unspecified: Secondary | ICD-10-CM | POA: Diagnosis not present

## 2021-03-28 LAB — CUP PACEART REMOTE DEVICE CHECK
Battery Remaining Longevity: 15 mo
Battery Voltage: 2.9 V
Brady Statistic AP VP Percent: 99.07 %
Brady Statistic AP VS Percent: 0 %
Brady Statistic AS VP Percent: 0.93 %
Brady Statistic AS VS Percent: 0 %
Brady Statistic RA Percent Paced: 98.75 %
Brady Statistic RV Percent Paced: 99.93 %
Date Time Interrogation Session: 20220408022604
HighPow Impedance: 52 Ohm
HighPow Impedance: 63 Ohm
Implantable Lead Implant Date: 20070615
Implantable Lead Implant Date: 20120813
Implantable Lead Implant Date: 20120813
Implantable Lead Location: 753858
Implantable Lead Location: 753859
Implantable Lead Location: 753860
Implantable Lead Model: 4396
Implantable Lead Model: 5076
Implantable Lead Model: 7121
Implantable Pulse Generator Implant Date: 20170329
Lead Channel Impedance Value: 228 Ohm
Lead Channel Impedance Value: 342 Ohm
Lead Channel Impedance Value: 399 Ohm
Lead Channel Impedance Value: 456 Ohm
Lead Channel Impedance Value: 589 Ohm
Lead Channel Impedance Value: 893 Ohm
Lead Channel Pacing Threshold Amplitude: 1.125 V
Lead Channel Pacing Threshold Pulse Width: 0.4 ms
Lead Channel Sensing Intrinsic Amplitude: 0.25 mV
Lead Channel Sensing Intrinsic Amplitude: 0.25 mV
Lead Channel Sensing Intrinsic Amplitude: 6.25 mV
Lead Channel Sensing Intrinsic Amplitude: 6.25 mV
Lead Channel Setting Pacing Amplitude: 1.75 V
Lead Channel Setting Pacing Amplitude: 2 V
Lead Channel Setting Pacing Amplitude: 2.5 V
Lead Channel Setting Pacing Pulse Width: 0.4 ms
Lead Channel Setting Pacing Pulse Width: 0.8 ms
Lead Channel Setting Sensing Sensitivity: 0.3 mV

## 2021-04-01 ENCOUNTER — Encounter: Payer: Self-pay | Admitting: Internal Medicine

## 2021-04-01 ENCOUNTER — Other Ambulatory Visit: Payer: Self-pay

## 2021-04-01 ENCOUNTER — Ambulatory Visit (INDEPENDENT_AMBULATORY_CARE_PROVIDER_SITE_OTHER): Payer: Medicare Other | Admitting: Internal Medicine

## 2021-04-01 VITALS — BP 120/84 | HR 62 | Ht 66.0 in | Wt 185.0 lb

## 2021-04-01 DIAGNOSIS — I255 Ischemic cardiomyopathy: Secondary | ICD-10-CM | POA: Diagnosis not present

## 2021-04-01 DIAGNOSIS — I480 Paroxysmal atrial fibrillation: Secondary | ICD-10-CM

## 2021-04-01 DIAGNOSIS — Z95810 Presence of automatic (implantable) cardiac defibrillator: Secondary | ICD-10-CM

## 2021-04-01 DIAGNOSIS — I5022 Chronic systolic (congestive) heart failure: Secondary | ICD-10-CM | POA: Diagnosis not present

## 2021-04-01 DIAGNOSIS — I2510 Atherosclerotic heart disease of native coronary artery without angina pectoris: Secondary | ICD-10-CM

## 2021-04-01 DIAGNOSIS — Z79899 Other long term (current) drug therapy: Secondary | ICD-10-CM

## 2021-04-01 DIAGNOSIS — I442 Atrioventricular block, complete: Secondary | ICD-10-CM | POA: Diagnosis not present

## 2021-04-01 MED ORDER — ROSUVASTATIN CALCIUM 40 MG PO TABS: 40.0000 mg | ORAL_TABLET | Freq: Every day | ORAL | 2 refills | Status: DC

## 2021-04-01 NOTE — Patient Instructions (Addendum)
Medication Instructions:  - Your physician has recommended you make the following change in your medication:   1) STOP lipitor (atorvastatin)  2) START crestor (rosuvastatin) 40 mg- take 1 tablet by mouth once daily   *If you need a refill on your cardiac medications before your next appointment, please call your pharmacy*   Lab Work: - Your physician recommends that you return for FASTING  lab work in 3 months (around mid July 2022): lipid/ liver  Nature conservation officer at [facility] 1st desk on the right to check in, past the screening table Lab hours: Monday- Friday (7:30 am- 5:30 pm)    If you have labs (blood work) drawn today and your tests are completely normal, you will receive your results only by: [name] MyChart Message (if you have MyChart) OR . A paper copy in the mail If you have any lab test that is abnormal or we need to change your treatment, we will call you to review the results.   Testing/Procedures: - none ordered   Follow-Up: At [facility], you and your health needs are our priority.  As part of our continuing mission to provide you with exceptional heart care, we have created designated Provider Care Teams.  These Care Teams include your primary Cardiologist (physician) and Advanced Practice Providers (APPs -  Physician Assistants and Nurse Practitioners) who all work together to provide you with the care you need, when you need it.  We recommend signing up for the patient portal called "MyChart".  Sign up information is provided on this After Visit Summary.  MyChart is used to connect with patients for Virtual Visits (Telemedicine).  Patients are able to view lab/test results, encounter notes, upcoming appointments, etc.  Non-urgent messages can be sent to your provider as well.   To learn more about what you can do with MyChart, go to [email or web address].    Your next appointment:   6 month(s)  The format for your next appointment:   In Person  Provider:    Virl Axe, MD   Other Instructions n/a

## 2021-04-01 NOTE — Progress Notes (Signed)
Patient Care Team: Ronnie Late, MD as PCP - General (Family Medicine) [name], MD as PCP - Cardiology (Cardiology) Rocco Serene, MD (Unknown Physician Specialty)   HPI  Ronnie [place] Nellie Chevalier. is a 80 y.o. male Seen in followup for ICD implantation initially for syncope in the setting of depressed left ventricular function.  And ischemic heart disease.  He had a 6949-lead. He reached ERI summer 2012 and underwent device explantation with new defibrillator lead insertion with left ventricular lead placement and generator replacement.   He underwent device generator replacement again 3/17   Atrial fibrillation. anticoagulation with Apixoban without bleeding.  At last visit there was acute cardiac congestion.  Myoview scanning demonstrated no ischemia with mild change in LV function.  (Discordant from echo?)  Started on twice a week Lasix.  He continues.  February 2022 developed upper extremity stiffness and difficulty using his hands.  Thought related to inflammation and got a steroid injection with significant interval improvement.  Also complains of bilateral upper extremity, mostly hand swelling.  Saw PCP and was referred to orthopedics and it has been attributed to carpal tunnel and has had interval surgery on one hand I left with improvement in both function and swelling.  The other hand is also scheduled for surgery  With Fitbit has had some heart rates noted while walking in the 160s, but without associated symptoms   DATE TEST EF   2012 Cath  40 % Patent LAD stent  12/15 Echo   55-65 %   7/20 Echo  50-55%   10/21 Myoview  46% No Ischemia          Date K Cr Hgb LDL  6/18 4.3 0.9 14   2/19 4.1 0.9 12.5   3/21 4.6 0.92 13.8   2/22 4.2 0.8 12.2 76    Has moved to independent living   Thromboembolic risk factors ( age  -2, HTN-1, TIA/CVA-2, Vasc disease -1, CHF -1) for a CHADSVASc Score of >=7     Past Medical History:  Diagnosis Date  . AICD (automatic  cardioverter/defibrillator) present   . Allergic rhinitis   . Arthritis   . Asthma   . Biventricular defibrillator-Medtronic    Generator replacement 2012 with CRT upgrade  . BPH (benign prostatic hyperplasia)   . Cardiac arrest ([place])   . Chronic lower back pain   . Diabetes mellitus without complication ([place])    BORDER LINE  . Dyspnea    PFT 12/19/10: FEV1 2.93(107%), FEV1% 72, TLC 8.02(138%), DLCO 110%, +BD  . ED (erectile dysfunction)   . GERD (gastroesophageal reflux disease)   . GERD (gastroesophageal reflux disease)   . High-grade heart block   . History of kidney stones   . Hyperlipidemia   . Hypertension   . 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 Myocardial infarction ([place])   . Sciatic nerve pain   . Severe sinus bradycardia   . Syncope       . TIA (transient ischemic attack) nov. 2011    Past Surgical History:  Procedure Laterality Date  . CATARACT EXTRACTION, BILATERAL    . COLONOSCOPY WITH PROPOFOL N/A 04/01/2018   Procedure: COLONOSCOPY WITH PROPOFOL;  Surgeon: [name], MD;  Location: [facility] ENDOSCOPY;  Service: Endoscopy;  Laterality: N/A;  . CORONARY ANGIOPLASTY    . CYSTOSCOPY WITH INSERTION OF UROLIFT N/A 07/15/2020   Procedure: CYSTOSCOPY WITH  INSERTION OF UROLIFT;  Surgeon: [name], MD;  Location: [facility] ORS;  Service: Urology;  Laterality: N/A;  . CYSTOSCOPY WITH LITHOLAPAXY N/A 07/15/2020   Procedure: CYSTOSCOPY WITH LITHOLAPAXY;  Surgeon: [name], MD;  Location: [facility] ORS;  Service: Urology;  Laterality: N/A;  . EP IMPLANTABLE DEVICE N/A 03/18/2016   Procedure:  BiV ICD Generator Changeout;  Surgeon: [name], MD;  Location: [facility] CV LAB;  Service: Cardiovascular;  Laterality: N/A;  . ESOPHAGOGASTRODUODENOSCOPY (EGD) WITH PROPOFOL N/A 04/01/2018   Procedure: ESOPHAGOGASTRODUODENOSCOPY (EGD) WITH PROPOFOL;  Surgeon:  [name], MD;  Location: [facility] ENDOSCOPY;  Service: Endoscopy;  Laterality: N/A;  . EYE SURGERY Bilateral    cataract extraction  . Implantation of Medtronic dual-chamber cardioverter    . JOINT REPLACEMENT Left    total knee replacement  . Left Knee Replacement  2006  . PROSTATE BIOPSY    . TONSILLECTOMY  1948  . TOTAL KNEE REVISION Left 06/20/2015   Procedure: TOTAL KNEE REVISION;  Surgeon: [name], MD;  Location: [facility] ORS;  Service: Orthopedics;  Laterality: Left;    Current Outpatient Medications  Medication Sig Dispense Refill  . acetaminophen (TYLENOL) 500 MG tablet Take 500 mg by mouth 2 (two) times daily.     [name] albuterol (VENTOLIN HFA) 108 (90 Base) MCG/ACT inhaler Inhale 1-2 puffs into the lungs every 6 (six) hours as needed for wheezing or shortness of breath.    [name] apixaban (ELIQUIS) 5 MG TABS tablet Take 1 tablet (5 mg total) by mouth 2 (two) times daily. 180 tablet 1  . atorvastatin (LIPITOR) 80 MG tablet Take 1 tablet (80 mg total) by mouth daily. 90 tablet 3  . bisoprolol (ZEBETA) 5 MG tablet Take 1 tablet (5 mg total) by mouth daily. 30 tablet 6  . Calcium Citrate-Vitamin D (CITRACAL + D PO) Take 2 tablets by mouth daily.     [name] dexlansoprazole (DEXILANT) 60 MG capsule Take 1 capsule (60 mg total) by mouth daily. 30 capsule 6  . dorzolamide (TRUSOPT) 2 % ophthalmic solution 1 drop 2 (two) times daily.     [name] dutasteride (AVODART) 0.5 MG capsule Take 0.5 mg by mouth every 3 (three) days.    . fluconazole (DIFLUCAN) 200 MG tablet Take 200 mg by mouth once a week.    . furosemide (LASIX) 40 MG tablet TAKE 1 TABLET BY MOUTH EVERY OTHER DAY AS DIRECTED 30 tablet 11  . glipiZIDE (GLUCOTROL XL) 5 MG 24 hr tablet Take 5 mg by mouth daily.    [name] ipratropium (ATROVENT) 0.06 % nasal spray     . latanoprost (XALATAN) 0.005 % ophthalmic solution Place 1 drop into the right eye at bedtime.     [name] losartan (COZAAR) 50 MG tablet Take 50 mg by mouth daily.    . Melatonin 10 MG  TABS Take 10 mg by mouth at bedtime.     . metFORMIN (GLUCOPHAGE-XR) 500 MG 24 hr tablet Take 500 mg by mouth in the morning and at bedtime.     . montelukast (SINGULAIR) 10 MG tablet Take 10 mg by mouth daily.     . Multiple Vitamin (MULTIVITAMIN) tablet Take 2 tablets by mouth daily.     No current facility-administered medications for this visit.    Allergies  Allergen Reactions  . Beta Adrenergic Blockers Other (See Comments)    Fatigue  . Oxycodone Nausea And Vomiting  . Sulfonamide Derivatives Nausea And Vomiting    Review of Systems negative except  from HPI and PMH  Physical Exam BP 120/84   Pulse 62   Ht 5\' 6"  (1.676 m)   Wt 185 lb (83.9 kg)   BMI 29.86 kg/m  .Well developed and well nourished in no acute distress HENT normal Neck supple  Clear Device pocket well healed; without hematoma or erythema.  There is no tethering  Regular rate and rhythm, no  murmur Abd-soft No Clubbing cyanosis   edema Skin-warm and dry A & Oriented  Grossly normal sensory and motor function  ECG AV pacing with a RS in lead V1 and a upright QRS in lead I and a QRS duration of 150 ms   Assessment and  Plan \ Ischemic Cardiomyopathy -- interval normalization prior LAD stenting  Complete heart block    Congestive heart failure acute/chronic/diastolic  Implantable defibrillator-CRT Medtronic   Atrial fibrillation paroxysmal  Bradycardia-sinus  Hypertension  Hyperlipidemia  Without symptoms of ischemia  No intercurrent atrial fibrillation or flutter but some atrial tachycardia although comparing rhythm burden on the histogram from his device is less than previously, moreover it is asymptomatic   Currently followed in ICM clinic and fluid status is well managed  Blood pressure well controlled  LDL is not at goal.  Maximum dose of atorvastatin.  We will switch to rosuvastatin and recheck in 6 months may benefit from augmentation with Zetia  Device is approaching ERI

## 2021-04-04 NOTE — Addendum Note (Signed)
Addended by: Ernie Hew D on: 04/04/2021 01:38 PM   Modules accepted: Orders

## 2021-04-11 NOTE — Progress Notes (Signed)
Remote ICD transmission.   

## 2021-04-24 ENCOUNTER — Ambulatory Visit (INDEPENDENT_AMBULATORY_CARE_PROVIDER_SITE_OTHER): Payer: Medicare Other

## 2021-04-24 DIAGNOSIS — I5022 Chronic systolic (congestive) heart failure: Secondary | ICD-10-CM

## 2021-04-24 DIAGNOSIS — Z95810 Presence of automatic (implantable) cardiac defibrillator: Secondary | ICD-10-CM | POA: Diagnosis not present

## 2021-04-25 NOTE — Progress Notes (Signed)
EPIC Encounter for ICM Monitoring  Patient Name: [name]. is a 80 y.o. male Date: 04/25/2021 Primary Care Physican: Derinda Late, MD Primary Cardiologist:Klein Electrophysiologist:Klein Bi-V Pacing:99.8% 4/5/2022Weight:180lbs 04/23/2021 Weight: 182 lbs   Spoke with patient and reports feeling well at this time.  Denies fluid symptoms.  Walking 3-4 miles of a day. He does have swelling on the hand that he is due to have carpal tunnel surgery.  Optivol thoracic impedancesuggesting possible fluid accumulation starting 04/12/2021 and returned to normal 04/23/2021.  Prescribed: Furosemide40 mg take 1 tablettwice a week.  Recommendations:  No changes and encouraged to call if experiencing any fluid symptoms.  Follow-up plan: ICM clinic phone appointment on6/13/2022. 91 day device clinic remote transmission7/07/2021.   EP/Cardiology Office Visits: Recall 10/12/2022with Dr. [name].   Copy of ICM check sent to [place]. .  3 month ICM trend: 04/24/2021.    1 Year ICM trend:       Rosalene Billings, RN 04/25/2021 12:00 PM

## 2021-06-02 ENCOUNTER — Ambulatory Visit (INDEPENDENT_AMBULATORY_CARE_PROVIDER_SITE_OTHER): Payer: Medicare Other

## 2021-06-02 DIAGNOSIS — I5022 Chronic systolic (congestive) heart failure: Secondary | ICD-10-CM | POA: Diagnosis not present

## 2021-06-02 DIAGNOSIS — Z95810 Presence of automatic (implantable) cardiac defibrillator: Secondary | ICD-10-CM | POA: Diagnosis not present

## 2021-06-04 ENCOUNTER — Other Ambulatory Visit: Payer: Self-pay | Admitting: Internal Medicine

## 2021-06-04 NOTE — Progress Notes (Signed)
EPIC Encounter for ICM Monitoring  Patient Name: [name]. is a 80 y.o. male Date: 06/04/2021 Primary Care Physican: Derinda Late, MD Primary Cardiologist: [name] Electrophysiologist: Vergie Living Pacing: 98.8%         06/04/2021 Weight: 179.5 lbs                                                            Spoke with patient and reports feeling well at this time.  Denies fluid symptoms.  Walking 3-4 miles of a day.    Optivol thoracic impedance suggesting normal fluid levels.       Prescribed: Furosemide 40 mg take 1 tablet twice a week.   Recommendations:   No changes and encouraged to call if experiencing any fluid symptoms.   Follow-up plan: ICM clinic phone appointment on 07/07/2021.   91 day device clinic remote transmission 06/27/2021.     EP/Cardiology Office Visits:  Recall 10/01/2021 with Dr. [name].     Copy of ICM check sent to Dr. [name].   3 month ICM trend: 06/02/2021.    1 Year ICM trend:       Rosalene Billings, RN 06/04/2021 3:41 PM

## 2021-06-04 NOTE — Telephone Encounter (Signed)
Pt last saw Dr [name] 04/01/21, last labs 02/03/21 Creat 0.8, age 80, weight 83.9, based on specified criteria pt is on appropriate dosage of Eliquis 5mg  BID.  Will refill rx.

## 2021-06-27 ENCOUNTER — Ambulatory Visit (INDEPENDENT_AMBULATORY_CARE_PROVIDER_SITE_OTHER): Payer: Medicare Other

## 2021-06-27 DIAGNOSIS — I442 Atrioventricular block, complete: Secondary | ICD-10-CM | POA: Diagnosis not present

## 2021-06-29 LAB — CUP PACEART REMOTE DEVICE CHECK
Battery Remaining Longevity: 12 mo
Battery Voltage: 2.88 V
Brady Statistic AP VP Percent: 96.76 %
Brady Statistic AP VS Percent: 0.27 %
Brady Statistic AS VP Percent: 2.96 %
Brady Statistic AS VS Percent: 0.01 %
Brady Statistic RA Percent Paced: 96.49 %
Brady Statistic RV Percent Paced: 99.42 %
Date Time Interrogation Session: 20220708032209
HighPow Impedance: 48 Ohm
HighPow Impedance: 59 Ohm
Implantable Lead Implant Date: 20070615
Implantable Lead Implant Date: 20120813
Implantable Lead Implant Date: 20120813
Implantable Lead Location: 753858
Implantable Lead Location: 753859
Implantable Lead Location: 753860
Implantable Lead Model: 4396
Implantable Lead Model: 5076
Implantable Lead Model: 7121
Implantable Pulse Generator Implant Date: 20170329
Lead Channel Impedance Value: 228 Ohm
Lead Channel Impedance Value: 342 Ohm
Lead Channel Impedance Value: 342 Ohm
Lead Channel Impedance Value: 418 Ohm
Lead Channel Impedance Value: 589 Ohm
Lead Channel Impedance Value: 893 Ohm
Lead Channel Pacing Threshold Amplitude: 1.25 V
Lead Channel Pacing Threshold Pulse Width: 0.4 ms
Lead Channel Sensing Intrinsic Amplitude: 0.25 mV
Lead Channel Sensing Intrinsic Amplitude: 0.25 mV
Lead Channel Sensing Intrinsic Amplitude: 6.25 mV
Lead Channel Sensing Intrinsic Amplitude: 6.25 mV
Lead Channel Setting Pacing Amplitude: 1.75 V
Lead Channel Setting Pacing Amplitude: 2 V
Lead Channel Setting Pacing Amplitude: 2.75 V
Lead Channel Setting Pacing Pulse Width: 0.4 ms
Lead Channel Setting Pacing Pulse Width: 0.8 ms
Lead Channel Setting Sensing Sensitivity: 0.3 mV

## 2021-07-07 ENCOUNTER — Ambulatory Visit: Payer: Medicare Other | Attending: Orthopedic Surgery | Admitting: Occupational Therapy

## 2021-07-07 ENCOUNTER — Ambulatory Visit (INDEPENDENT_AMBULATORY_CARE_PROVIDER_SITE_OTHER): Payer: Medicare Other

## 2021-07-07 DIAGNOSIS — R600 Localized edema: Secondary | ICD-10-CM | POA: Insufficient documentation

## 2021-07-07 DIAGNOSIS — I5022 Chronic systolic (congestive) heart failure: Secondary | ICD-10-CM

## 2021-07-07 DIAGNOSIS — M25642 Stiffness of left hand, not elsewhere classified: Secondary | ICD-10-CM | POA: Diagnosis present

## 2021-07-07 DIAGNOSIS — Z95810 Presence of automatic (implantable) cardiac defibrillator: Secondary | ICD-10-CM

## 2021-07-07 DIAGNOSIS — M25641 Stiffness of right hand, not elsewhere classified: Secondary | ICD-10-CM | POA: Insufficient documentation

## 2021-07-07 NOTE — Therapy (Signed)
[facility] [facility] [address], [place], [address] Phone: [phone]   Fax:  [phone]  Occupational Therapy Evaluation  Patient Details  Name: [name]. MRN: [id number] Date of Birth: 1941/11/24 Referring Provider (OT): Arvella Nigh   Encounter Date: 07/07/2021   OT End of Session - 07/07/21 1844     Visit Number 1    Number of Visits 3    Date for OT Re-Evaluation 07/28/21    OT Start Time 1302    OT Stop Time 1344    OT Time Calculation (min) 42 min    Activity Tolerance Patient tolerated treatment well    Behavior During Therapy [facility] for tasks assessed/performed             Past Medical History:  Diagnosis Date   AICD (automatic cardioverter/defibrillator) present    Allergic rhinitis    Arthritis    Asthma    Biventricular defibrillator-Medtronic    Generator replacement 2012 with CRT upgrade   BPH (benign prostatic hyperplasia)    Cardiac arrest ([place])    Chronic lower back pain    Diabetes mellitus without complication ([place])    BORDER LINE   Dyspnea    PFT 12/19/10: FEV1 2.93(107%), FEV1% 72, TLC 8.02(138%), DLCO 110%, +BD   ED (erectile dysfunction)    GERD (gastroesophageal reflux disease)    GERD (gastroesophageal reflux disease)    High-grade heart block    History of kidney stones    Hyperlipidemia    Hypertension    ischemic cardiomyopathy    stent to a 95%-occluded LAD in 1995; catheterization in 2012 demonstrated patent grafts and nonobstructive disease   Myocardial infarction [facility])    Sciatic nerve pain    Severe sinus bradycardia    Syncope        TIA (transient ischemic attack) nov. 2011    Past Surgical History:  Procedure Laterality Date   CATARACT EXTRACTION, BILATERAL     COLONOSCOPY WITH PROPOFOL N/A 04/01/2018   Procedure: COLONOSCOPY WITH PROPOFOL;  Surgeon: [name], MD;  Location: [facility] ENDOSCOPY;  Service: Endoscopy;  Laterality: N/A;    CORONARY ANGIOPLASTY     CYSTOSCOPY WITH INSERTION OF UROLIFT N/A 07/15/2020   Procedure: CYSTOSCOPY WITH INSERTION OF UROLIFT;  Surgeon: [name], MD;  Location: [facility] ORS;  Service: Urology;  Laterality: N/A;   CYSTOSCOPY WITH LITHOLAPAXY N/A 07/15/2020   Procedure: CYSTOSCOPY WITH LITHOLAPAXY;  Surgeon: [name], MD;  Location: [facility] ORS;  Service: Urology;  Laterality: N/A;   EP IMPLANTABLE DEVICE N/A 03/18/2016   Procedure:  BiV ICD Generator Changeout;  Surgeon: [name], MD;  Location: [place] CV LAB;  Service: Cardiovascular;  Laterality: N/A;   ESOPHAGOGASTRODUODENOSCOPY (EGD) WITH PROPOFOL N/A 04/01/2018   Procedure: ESOPHAGOGASTRODUODENOSCOPY (EGD) WITH PROPOFOL;  Surgeon: [name], MD;  Location: [facility] ENDOSCOPY;  Service: Endoscopy;  Laterality: N/A;   EYE SURGERY Bilateral    cataract extraction   Implantation of Medtronic dual-chamber cardioverter     JOINT REPLACEMENT Left    total knee replacement   Left Knee Replacement  2006   PROSTATE BIOPSY     TONSILLECTOMY  1948   TOTAL KNEE REVISION Left 06/20/2015   Procedure: TOTAL KNEE REVISION;  Surgeon: [name], MD;  Location: [facility] ORS;  Service: Orthopedics;  Laterality: Left;    There were no vitals filed for this visit.   Subjective Assessment - 07/07/21 1834     Subjective  I had hand swelling for few years-  after my CTS the last couple of months worse - but the  steroid of last month helped but still tight to make fist and when I make fist - my fingers hurt -swelling stay about the same -has arthritis in my thumbs I know - had shots in the past    Pertinent History Pt had 03/26/21 L CTR and 05/28/21 R CTR - in 06/11/21 stitches were taken out and cont with swelling in bilateral hands - refer to OT and steroid provided - per pt doing better since steroid    Patient Stated Goals Wants to swelling better and pain in my hands if possible so I can grip and squeezing objects withoutpain    Currently in  Pain? Yes    Pain Score 4     Pain Location Hand    Pain Orientation Right;Left    Pain Descriptors / Indicators Aching;Tightness;Sore    Pain Type Chronic pain;Surgical pain    Pain Onset More than a month ago    Pain Frequency Intermittent               [facility] OT Assessment - 07/07/21 0001       Assessment   Medical Diagnosis Bilateral CTR with hand swelling    Referring Provider (OT) Arvella Nigh    Onset Date/Surgical Date 05/28/21   and 03/26/21   Hand Dominance Right      Home  Environment   Lives With Spouse      Prior Function   Vocation Retired    Leisure likes to do crossword puzzles, some pots for gardening      AROM   Right Wrist Extension 40 Degrees    Right Wrist Flexion 85 Degrees    Left Wrist Extension 50 Degrees    Left Wrist Flexion 70 Degrees      Strength   Right Hand Grip (lbs) 40    Right Hand Lateral Pinch 14 lbs    Right Hand 3 Point Pinch 11 lbs    Left Hand Grip (lbs) 40    Left Hand Lateral Pinch 13 lbs    Left Hand 3 Point Pinch 9 lbs      Right Hand AROM   R Index  MCP 0-90 82 Degrees    R Index PIP 0-100 95 Degrees    R Long  MCP 0-90 80 Degrees    R Long PIP 0-100 95 Degrees    R Ring  MCP 0-90 75 Degrees    R Ring PIP 0-100 95 Degrees    R Little  MCP 0-90 90 Degrees    R Little PIP 0-100 90 Degrees      Left Hand AROM   L Index  MCP 0-90 80 Degrees    L Index PIP 0-100 90 Degrees    L Long  MCP 0-90 82 Degrees    L Long PIP 0-100 90 Degrees    L Ring  MCP 0-90 75 Degrees    L Ring PIP 0-100 90 Degrees    L Little  MCP 0-90 80 Degrees    L Little PIP 0-100 80 Degrees             Pt ed on HEP and hand out provided  Ed on joint protection principles for thumb CMC OA and digits pain and edema Contrast to do 2 x day  Isotoner gloves for night time but as needed during day Tendon glides pain free  Opposition to all digits pain free And AAROM for wrist extention stretch bilateral  10reps  2 x day pain free  Scar massage  to R hand                  OT Education - 07/07/21 1844     Education Details findings of eval and HEP    Person(s) Educated Patient    Methods Explanation;Demonstration;Tactile cues;Verbal cues;Handout    Comprehension Verbal cues required;Returned demonstration;Verbalized understanding                 OT Long Term Goals - 07/07/21 1850       OT LONG TERM GOAL #1   Title Pt to be ind in HEP to decrease edema and pain in bilateral hands and increase AROM for digits  with pain less than 1-2/10    Baseline pain with fist 4/10- decrease AROM for MC and PIP's flexion , tight with fist -    Time 2    Period Weeks    Status New    Target Date 07/21/21      OT LONG TERM GOAL #2   Title Pt to to [place] and use 3 joint protection or AE to decrease pain and edema in hands    Baseline no knowlege - forcefull flexion and PROM - pain with fisting and use 4/10    Time 3    Period Weeks    Status New    Target Date 07/28/21                   Plan - 07/07/21 1845     Clinical Impression Statement Pt present at OT eval s/p more than 3 months L CTR and 5 1/2 wks R CTR - pt report having for long time edema in bilateral hands , with increase pain and decrease strength wtih gripping - pt scars doing well - pt ed on scar massage on R scar, pt show decrease end range for [facility] and PIP's flexion - and pain with tight fist - reinforce for pt to do HEP lightly and not do forecefull PROM or AROM to digits - pt do have history of bilateral thumb CMC OA and had shots in the past and prehension strength decrease - Grip strength in lower range forhis age- pt ed on joint protection , edema control and AROM only for digits- pt can benefit from OT services to decrease edeama and pain , increase AROM for functional use    OT Occupational Profile and History Problem Focused Assessment - Including review of records relating to presenting problem    Occupational performance deficits  (Please refer to evaluation for details): ADL's;IADL's;Leisure;Play    Body Structure / Function / Physical Skills ADL;Flexibility;IADL;ROM;Edema;Dexterity;Strength;Pain;Decreased knowledge of precautions;Scar mobility    Rehab Potential Fair    Clinical Decision Making Limited treatment options, no task modification necessary    Comorbidities Affecting Occupational Performance: None    Modification or Assistance to Complete Evaluation  No modification of tasks or assist necessary to complete eval    OT Frequency 1x / week    OT Duration --   3 wks   OT Treatment/Interventions Self-care/ADL training;Contrast Bath;DME and/or AE instruction;Therapeutic exercise;Scar mobilization;Manual Therapy;Patient/family education    Consulted and Agree with Plan of Care Patient             Patient will benefit from skilled therapeutic intervention in order to improve the following deficits and impairments:   Body Structure /  Function / Physical Skills: ADL, Flexibility, IADL, ROM, Edema, Dexterity, Strength, Pain, Decreased knowledge of precautions, Scar mobility       Visit Diagnosis: Localized edema - Plan: Ot plan of care cert/re-cert  Stiffness of right hand, not elsewhere classified - Plan: Ot plan of care cert/re-cert  Stiffness of left hand, not elsewhere classified - Plan: Ot plan of care cert/re-cert    Problem List Patient Active Problem List   Diagnosis Date Noted   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 12/24/2017   Acquired bladder diverticulum 03/25/2017   Bladder calculus 03/25/2017   Gross hematuria 03/02/2017   Nephrolithiasis 09/01/2016   Diabetes mellitus without complication ([place]) 83/35/8251   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/2015   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/13/2014   Asthma 08/08/2014   Osteoarthritis 08/08/2014   Lumbar stenosis with neurogenic claudication 07/03/2014   CAD (coronary artery disease), native coronary artery 05/31/2014   DDD  (degenerative disc disease), lumbar 05/31/2014   Esophageal reflux 05/31/2014   Thoracic or lumbosacral neuritis or radiculitis 05/31/2014   ED (erectile dysfunction) of organic origin 09/21/2012   Elevated prostate specific antigen (PSA) 09/21/2012   Encysted hydrocele 09/21/2012   Family history of malignant neoplasm of prostate 09/21/2012   Incomplete emptying of bladder 09/21/2012   Nodular prostate with urinary obstruction 09/21/2012   Hyperlipidemia 08/25/2012   Atrial fibrillation ([place]) 08/25/2012   Biventricular cardioverter-defibrillator-Medtronic    Severe sinus bradycardia    High-grade heart block    SHORTNESS OF BREATH 89/84/2103   SYSTOLIC HEART FAILURE, CHRONIC 10/14/2010   HYPERTENSION, BENIGN 10/15/2009   Cardiomyopathy, ischemic 10/15/2009   Chronic ischemic heart disease 10/15/2009    Rosalyn Gess OTR/l,CLT 07/07/2021, 6:55 PM  [facility] [facility] [address], [place], [address] Phone: [phone]   Fax:  [phone]  Name: [name]. MRN: [id number] Date of Birth: 10/24/1941

## 2021-07-11 NOTE — Progress Notes (Signed)
EPIC Encounter for ICM Monitoring  Patient Name: [name]. is a 80 y.o. male Date: 07/11/2021 Primary Care Physican: Derinda Late, MD Primary Cardiologist: [name] Electrophysiologist: Vergie Living Pacing: 99.9%         07/11/2021 Weight: 180 lbs                                                            Spoke with patient and reports feeling well at this time.  Denies fluid symptoms.  Walking 3-4 miles of a day.  He was at the beach last week which may have caused decreased impedance last week.   Optivol thoracic impedance suggesting normal fluid levels.       Prescribed: Furosemide 40 mg take 1 tablet twice a week.   Recommendations:   No changes and encouraged to call if experiencing any fluid symptoms.   Follow-up plan: ICM clinic phone appointment on 08/11/2021.   91 day device clinic remote transmission 09/26/2021.     EP/Cardiology Office Visits:  Recall 10/01/2021 with Dr. [name].     Copy of ICM check sent to Dr. [name].   3 month ICM trend: 07/07/2021.    1 Year ICM trend:       Rosalene Billings, RN 07/11/2021 3:47 PM

## 2021-07-17 ENCOUNTER — Ambulatory Visit: Payer: Medicare Other | Admitting: Occupational Therapy

## 2021-07-17 DIAGNOSIS — M25642 Stiffness of left hand, not elsewhere classified: Secondary | ICD-10-CM

## 2021-07-17 DIAGNOSIS — R600 Localized edema: Secondary | ICD-10-CM | POA: Diagnosis not present

## 2021-07-17 DIAGNOSIS — M25641 Stiffness of right hand, not elsewhere classified: Secondary | ICD-10-CM

## 2021-07-17 NOTE — Therapy (Signed)
[place] [address] [place], [address] Phone: [phone]   Fax:  [phone]  Occupational Therapy Treatment  Patient Details  Name: [name]. MRN: [id number] Date of Birth: 07/28/1941 Referring Provider (OT): Arvella Nigh   Encounter Date: 07/17/2021   OT End of Session - 07/17/21 1319     Visit Number 2    Number of Visits 3    Date for OT Re-Evaluation 07/28/21    OT Start Time [id number]    OT Stop Time 1350    OT Time Calculation (min) 33 min    Activity Tolerance Patient tolerated treatment well    Behavior During Therapy [facility] for tasks assessed/performed             Past Medical History:  Diagnosis Date   AICD (automatic cardioverter/defibrillator) present    Allergic rhinitis    Arthritis    Asthma    Biventricular defibrillator-Medtronic    Generator replacement 2012 with CRT upgrade   BPH (benign prostatic hyperplasia)    Cardiac arrest ([employer])    Chronic lower back pain    Diabetes mellitus without complication ([place])    BORDER LINE   Dyspnea    PFT 12/19/10: FEV1 2.93(107%), FEV1% 72, TLC 8.02(138%), DLCO 110%, +BD   ED (erectile dysfunction)    GERD (gastroesophageal reflux disease)    GERD (gastroesophageal reflux disease)    High-grade heart block    History of kidney stones    Hyperlipidemia    Hypertension    ischemic cardiomyopathy    stent to a 95%-occluded LAD in 1995; catheterization in 2012 demonstrated patent grafts and nonobstructive disease   Myocardial infarction [facility])    Sciatic nerve pain    Severe sinus bradycardia    Syncope        TIA (transient ischemic attack) nov. 2011    Past Surgical History:  Procedure Laterality Date   CATARACT EXTRACTION, BILATERAL     COLONOSCOPY WITH PROPOFOL N/A 04/01/2018   Procedure: COLONOSCOPY WITH PROPOFOL;  Surgeon: [name], MD;  Location: [facility] ENDOSCOPY;  Service: Endoscopy;  Laterality: N/A;    CORONARY ANGIOPLASTY     CYSTOSCOPY WITH INSERTION OF UROLIFT N/A 07/15/2020   Procedure: CYSTOSCOPY WITH INSERTION OF UROLIFT;  Surgeon: [name], MD;  Location: [facility] ORS;  Service: Urology;  Laterality: N/A;   CYSTOSCOPY WITH LITHOLAPAXY N/A 07/15/2020   Procedure: CYSTOSCOPY WITH LITHOLAPAXY;  Surgeon: [name], MD;  Location: [facility] ORS;  Service: Urology;  Laterality: N/A;   EP IMPLANTABLE DEVICE N/A 03/18/2016   Procedure:  BiV ICD Generator Changeout;  Surgeon: [name], MD;  Location: [place] CV LAB;  Service: Cardiovascular;  Laterality: N/A;   ESOPHAGOGASTRODUODENOSCOPY (EGD) WITH PROPOFOL N/A 04/01/2018   Procedure: ESOPHAGOGASTRODUODENOSCOPY (EGD) WITH PROPOFOL;  Surgeon: [name], MD;  Location: [facility] ENDOSCOPY;  Service: Endoscopy;  Laterality: N/A;   EYE SURGERY Bilateral    cataract extraction   Implantation of Medtronic dual-chamber cardioverter     JOINT REPLACEMENT Left    total knee replacement   Left Knee Replacement  2006   PROSTATE BIOPSY     TONSILLECTOMY  1948   TOTAL KNEE REVISION Left 06/20/2015   Procedure: TOTAL KNEE REVISION;  Surgeon: [name], MD;  Location: [facility] ORS;  Service: Orthopedics;  Laterality: Left;    There were no vitals filed for this visit.   Subjective Assessment - 07/17/21 1318     Subjective  I have done the hot and cold and the glove I wear night time and some during day -and even walking - and it helps for the swelling- both hands better- R still better than the L    Pertinent History Pt had 03/26/21 L CTR and 05/28/21 R CTR - in 06/11/21 stitches were taken out and cont with swelling in bilateral hands - refer to OT and steroid provided - per pt doing better since steroid    Patient Stated Goals Wants to swelling better and pain in my hands if possible so I can grip and squeezing objects withoutpain    Currently in Pain? Yes    Pain Score 1    R and L 3/10   Pain Location Hand    Pain Orientation Right;Left     Pain Descriptors / Indicators Aching;Tightness;Sore    Pain Type Chronic pain;Surgical pain    Pain Onset More than a month ago    Pain Frequency Intermittent                [facility] OT Assessment - 07/17/21 0001       AROM   Right Wrist Extension 54 Degrees    Right Wrist Flexion 85 Degrees    Left Wrist Extension 60 Degrees    Left Wrist Flexion 77 Degrees      Strength   Right Hand Grip (lbs) 45    Right Hand Lateral Pinch 14 lbs    Right Hand 3 Point Pinch 11 lbs    Left Hand Grip (lbs) 40    Left Hand Lateral Pinch 13 lbs    Left Hand 3 Point Pinch 9 lbs      Right Hand AROM   R Index  MCP 0-90 85 Degrees    R Index PIP 0-100 95 Degrees    R Long  MCP 0-90 85 Degrees    R Long PIP 0-100 95 Degrees    R Ring  MCP 0-90 85 Degrees    R Ring PIP 0-100 95 Degrees    R Little  MCP 0-90 90 Degrees    R Little PIP 0-100 95 Degrees      Left Hand AROM   L Index  MCP 0-90 85 Degrees    L Index PIP 0-100 90 Degrees    L Long  MCP 0-90 85 Degrees    L Long PIP 0-100 90 Degrees    L Ring  MCP 0-90 85 Degrees    L Ring PIP 0-100 90 Degrees    L Little  MCP 0-90 90 Degrees    L Little PIP 0-100 90 Degrees             Pt show increase wrist ext and flexion in bilateral wrist  And increase AROM in digits flexion  Grip increase in R hand by 5 lbs  Pain  decrease in R hand more than L - but R CT scar still tight and adhere     REview again HEP and hand out provided last time Add CT spreads - ed on and wife to assist at home   Ed on joint protection principles for thumb CMC OA and digits pain and edema Contrast to do 2 x day to cont with Isotoner gloves for night time but as needed during day Tendon glides pain free Opposition to all digits pain free And AAROM for wrist extention stretch bilateral- prayer stretch 10reps  2 x day pain free Scar massage to R hand  OT Education - 07/17/21 1319     Education Details progress and HEP  review    Person(s) Educated Patient    Methods Explanation;Demonstration;Tactile cues;Verbal cues;Handout    Comprehension Verbal cues required;Returned demonstration;Verbalized understanding                 OT Long Term Goals - 07/07/21 1850       OT LONG TERM GOAL #1   Title Pt to be ind in HEP to decrease edema and pain in bilateral hands and increase AROM for digits  with pain less than 1-2/10    Baseline pain with fist 4/10- decrease AROM for MC and PIP's flexion , tight with fist -    Time 2    Period Weeks    Status New    Target Date 07/21/21      OT LONG TERM GOAL #2   Title Pt to to [place] and use 3 joint protection or AE to decrease pain and edema in hands    Baseline no knowlege - forcefull flexion and PROM - pain with fisting and use 4/10    Time 3    Period Weeks    Status New    Target Date 07/28/21                   Plan - 07/17/21 1319     Clinical Impression Statement Pt present at OT eval s/p 4  months L CTR and 8 wks R CTR - pt report improvement in his swelling and pain and stiffness R hand better than L - L hand still tightenss and discomofrt of 3/10 over the dorsal PIP and middle phalanges to proximal - and R hand only 1/10 discomfort- pt showed increase AROM in digits in bilateral hands and increase grip in R hand - pt to cont wtih same HEP - Review again scar massage on R scar, pt  cont to show decrease end range for [facility] and PIP's flexion - and pain with tight fist - L worse than R - reinforce for pt to do HEP lightly and not do forecefull PROM or AROM to digits - pt do have history of bilateral thumb CMC OA and had shots in the past and prehension strength decrease - Grip strength in lower range forhis age- REview again joint protection , edema control and AROM only for digits- pt can benefit from OT services to decrease edeama and pain , increase AROM for functional use    OT Occupational Profile and History Problem Focused Assessment -  Including review of records relating to presenting problem    Occupational performance deficits (Please refer to evaluation for details): ADL's;IADL's;Leisure;Play    Body Structure / Function / Physical Skills ADL;Flexibility;IADL;ROM;Edema;Dexterity;Strength;Pain;Decreased knowledge of precautions;Scar mobility    Rehab Potential Fair    Clinical Decision Making Limited treatment options, no task modification necessary    Comorbidities Affecting Occupational Performance: None    Modification or Assistance to Complete Evaluation  No modification of tasks or assist necessary to complete eval    OT Frequency 1x / week    OT Duration 2 weeks    OT Treatment/Interventions Self-care/ADL training;Contrast Bath;DME and/or AE instruction;Therapeutic exercise;Scar mobilization;Manual Therapy;Patient/family education    Consulted and Agree with Plan of Care Patient             Patient will benefit from skilled therapeutic intervention in order to improve the following deficits and impairments:   Body Structure / Function / Physical Skills: ADL,  Flexibility, IADL, ROM, Edema, Dexterity, Strength, Pain, Decreased knowledge of precautions, Scar mobility       Visit Diagnosis: Stiffness of right hand, not elsewhere classified  Stiffness of left hand, not elsewhere classified  Localized edema    Problem List Patient Active Problem List   Diagnosis Date Noted   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 12/24/2017   Acquired bladder diverticulum 03/25/2017   Bladder calculus 03/25/2017   Gross hematuria 03/02/2017   Nephrolithiasis 09/01/2016   Diabetes mellitus without complication ([place]) 0000000   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/13/2014   Asthma 08/08/2014   Osteoarthritis 08/08/2014   Lumbar stenosis with neurogenic claudication 07/03/2014   CAD (coronary artery disease), native coronary artery 05/31/2014   DDD (degenerative disc  disease), lumbar 05/31/2014   Esophageal reflux 05/31/2014   Thoracic or lumbosacral neuritis or radiculitis 05/31/2014   ED (erectile dysfunction) of organic origin 09/21/2012   Elevated prostate specific antigen (PSA) 09/21/2012   Encysted hydrocele 09/21/2012   Family history of malignant neoplasm of prostate 09/21/2012   Incomplete emptying of bladder 09/21/2012   Nodular prostate with urinary obstruction 09/21/2012   Hyperlipidemia 08/25/2012   Atrial fibrillation ([place]) 08/25/2012   Biventricular cardioverter-defibrillator-Medtronic    Severe sinus bradycardia    High-grade heart block    SHORTNESS OF BREATH [id number]   SYSTOLIC HEART FAILURE, CHRONIC 10/14/2010   HYPERTENSION, BENIGN 10/15/2009   Cardiomyopathy, ischemic 10/15/2009   Chronic ischemic heart disease 10/15/2009    Rosalyn Gess OTR/L,CLT 07/17/2021, 1:59 PM  [place] [facility] [address], [place], [address] Phone: [phone]   Fax:  [phone]  Name: [name]. MRN: [id number] Date of Birth: 1941-10-23

## 2021-07-21 NOTE — Progress Notes (Signed)
Remote ICD transmission.   

## 2021-08-07 ENCOUNTER — Other Ambulatory Visit: Payer: Self-pay | Admitting: Internal Medicine

## 2021-08-11 ENCOUNTER — Ambulatory Visit (INDEPENDENT_AMBULATORY_CARE_PROVIDER_SITE_OTHER): Payer: Medicare Other

## 2021-08-11 DIAGNOSIS — I5022 Chronic systolic (congestive) heart failure: Secondary | ICD-10-CM

## 2021-08-11 DIAGNOSIS — Z95810 Presence of automatic (implantable) cardiac defibrillator: Secondary | ICD-10-CM | POA: Diagnosis not present

## 2021-08-14 ENCOUNTER — Telehealth: Payer: Self-pay

## 2021-08-14 NOTE — Telephone Encounter (Signed)
The patient agreed to send missed HF transmission sometime today.

## 2021-08-15 ENCOUNTER — Telehealth: Payer: Self-pay

## 2021-08-15 NOTE — Telephone Encounter (Signed)
Patient left a message letting me know his handheld died. I left him a message with Medtronic tech support number. I also let him know the transmission did come across around 7:28 pm.

## 2021-08-15 NOTE — Progress Notes (Signed)
EPIC Encounter for ICM Monitoring  Patient Name: [name]. is a 80 y.o. male Date: 08/15/2021 Primary Care Physican: Derinda Late, MD Primary Cardiologist: [name] Electrophysiologist: Vergie Living Pacing: 100%         07/11/2021 Weight: 180 lbs                                                            Spoke with patient and reports feeling well at this time.  Denies fluid symptoms.  Walking 3-4 miles of a day.    Optivol thoracic impedance suggesting normal fluid levels.       Prescribed: Furosemide 40 mg take 1 tablet twice a week.   Recommendations:   No changes and encouraged to call if experiencing any fluid symptoms.   Follow-up plan: ICM clinic phone appointment on 09/15/2021.   91 day device clinic remote transmission 09/26/2021.     EP/Cardiology Office Visits:  Recall 10/01/2021 with Dr. [name].     Copy of ICM check sent to Dr. [name].    3 month ICM trend: 08/14/2021.    1 Year ICM trend:       Rosalene Billings, RN 08/15/2021 3:14 PM

## 2021-09-15 ENCOUNTER — Ambulatory Visit (INDEPENDENT_AMBULATORY_CARE_PROVIDER_SITE_OTHER): Payer: Medicare Other

## 2021-09-15 DIAGNOSIS — I5022 Chronic systolic (congestive) heart failure: Secondary | ICD-10-CM | POA: Diagnosis not present

## 2021-09-15 DIAGNOSIS — Z95810 Presence of automatic (implantable) cardiac defibrillator: Secondary | ICD-10-CM

## 2021-09-15 NOTE — Progress Notes (Signed)
EPIC Encounter for ICM Monitoring  Patient Name: [name]. is a 80 y.o. male Date: 09/15/2021 Primary Care Physican: Derinda Late, MD Primary Cardiologist: [name] Electrophysiologist: Vergie Living Pacing: 100%         09/15/2021 Weight: 182 lbs                                                            Spoke with patient and reports feeling well at this time.  Denies fluid symptoms.     Optivol thoracic impedance suggesting normal fluid levels.       Prescribed: Furosemide 40 mg take 1 tablet twice a week.   Recommendations:   No changes and encouraged to call if experiencing any fluid symptoms.   Follow-up plan: ICM clinic phone appointment on 10/20/2021.   91 day device clinic remote transmission 09/26/2021.     EP/Cardiology Office Visits:  11/25/2021 with Dr. [name].     Copy of ICM check sent to Dr. [name].     3 month ICM trend: 09/15/2021.    1 Year ICM trend:       Rosalene Billings, RN 09/15/2021 3:55 PM

## 2021-09-18 ENCOUNTER — Telehealth: Payer: Self-pay

## 2021-09-18 NOTE — Telephone Encounter (Signed)
Pt [name] on triage line stating that he had one episode of blood in the urine and would like a call back.   Called pt back he c/o one episode of blood in the urine this morning. He describes the blood as dark close to merlot. He denies fever, chills, nausea, pain, or vomiting. He was offered a same day appt which he declined. Pt scheduled for next available. Pt voiced understanding on bleeding precautions and when to seek care in the ED.

## 2021-09-18 NOTE — Progress Notes (Signed)
09/19/2021 5:05 PM   Ronnie April Jr. 24-Jul-1941 [id number]  Referring provider: Derinda Late, MD [phone] [facility] [place],  [place] [address]  Chief Complaint  Patient presents with   Hematuria   Urological history: 1. Elevated PSA -PSA as high as 6.2 off dutasteride in 2017 -PSA was 3.12 (6.24) in June 2019 and 3.7 in June 2018 -recalls PSA usually "2-5" or 4-10 -had three biopsies. Dr. Bernardo Heater did one and Dr. Jacqlyn Larsen did two with the last one around 2015  2. Family history of prostate cancer -father had prostate cancer  3. BPH with LU TS -cysto 2021 enlarged prostate w/ bilobar coaptation - Mildly elevated bladder neck - Bilateral ureteral orifices identified - Bladder mucosa  reveals no ulcers, tumors, or lesions - Saccules in diverticula and 8 or so stones nothing more than 8 mm (mostly smaller) - No trabeculation - Chronic obstruction   -Estimated prostate volume calculated by CT scan to be 68g  -s/p UroLift 06/2020 -PVR 48 mL -managed with dutasteride 0.5 mg every three days   4. Bladder stones -CTU 2021 - Numerous bladder stones are evident measuring up to about 8 mm in size. -s/p cystolitholapaxy 06/2020 -KUB 3 bladder stones   5. High risk hematuria -non-smoker -CTU 04/2020 - bladder stones -cysto 05/2020 - NED  -reports of gross heme -UA > 30 RBC's  HPI: [name]. Is a 80 y.o. male who presents today for gross hematuria.  On Wednesday, he passed a blood clot in his urine stream and had gross hematuria for the rest of the day.  It is subsequently cleared but the urine continues to be yellow cloudy.  He is also noted an increase in his urgency and frequency.  He feels he may have passed a small stone as well.  UA nitrite positive, > 30 WBC's, > 30 RBC's and many bacteria.  PVR 48 mL    KUB 3 bladder stones seen ~ 8 mm in size `  PMH: Past Medical History:  Diagnosis Date   AICD  (automatic cardioverter/defibrillator) present    Allergic rhinitis    Arthritis    Asthma    Biventricular defibrillator-Medtronic    Generator replacement 2012 with CRT upgrade   BPH (benign prostatic hyperplasia)    Cardiac arrest ([place])    Chronic lower back pain    Diabetes mellitus without complication ([place])    BORDER LINE   Dyspnea    PFT 12/19/10: FEV1 2.93(107%), FEV1% 72, TLC 8.02(138%), DLCO 110%, +BD   ED (erectile dysfunction)    GERD (gastroesophageal reflux disease)    GERD (gastroesophageal reflux disease)    High-grade heart block    History of kidney stones    Hyperlipidemia    Hypertension    ischemic cardiomyopathy    stent to a 95%-occluded LAD in 1995; catheterization in 2012 demonstrated patent grafts and nonobstructive disease   Myocardial infarction [facility])    Sciatic nerve pain    Severe sinus bradycardia    Syncope        TIA (transient ischemic attack) nov. 2011    Surgical History: Past Surgical History:  Procedure Laterality Date   CATARACT EXTRACTION, BILATERAL     COLONOSCOPY WITH PROPOFOL N/A 04/01/2018   Procedure: COLONOSCOPY WITH PROPOFOL;  Surgeon: [name], MD;  Location: [facility] ENDOSCOPY;  Service: Endoscopy;  Laterality: N/A;   CORONARY ANGIOPLASTY     CYSTOSCOPY WITH INSERTION OF UROLIFT  N/A 07/15/2020   Procedure: CYSTOSCOPY WITH INSERTION OF UROLIFT;  Surgeon: [name], MD;  Location: [facility] ORS;  Service: Urology;  Laterality: N/A;   CYSTOSCOPY WITH LITHOLAPAXY N/A 07/15/2020   Procedure: CYSTOSCOPY WITH LITHOLAPAXY;  Surgeon: [name], MD;  Location: [facility] ORS;  Service: Urology;  Laterality: N/A;   EP IMPLANTABLE DEVICE N/A 03/18/2016   Procedure:  BiV ICD Generator Changeout;  Surgeon: [name], MD;  Location: [place] CV LAB;  Service: Cardiovascular;  Laterality: N/A;   ESOPHAGOGASTRODUODENOSCOPY (EGD) WITH PROPOFOL N/A 04/01/2018   Procedure: ESOPHAGOGASTRODUODENOSCOPY (EGD) WITH PROPOFOL;  Surgeon:  [name], MD;  Location: [facility] ENDOSCOPY;  Service: Endoscopy;  Laterality: N/A;   EYE SURGERY Bilateral    cataract extraction   Implantation of Medtronic dual-chamber cardioverter     JOINT REPLACEMENT Left    total knee replacement   Left Knee Replacement  2006   PROSTATE BIOPSY     TONSILLECTOMY  1948   TOTAL KNEE REVISION Left 06/20/2015   Procedure: TOTAL KNEE REVISION;  Surgeon: [name], MD;  Location: [facility] ORS;  Service: Orthopedics;  Laterality: Left;    Home Medications:  Allergies as of 09/19/2021       Reactions   Beta Adrenergic Blockers Other (See Comments)   Fatigue   Oxycodone Nausea And Vomiting   Sulfonamide Derivatives Nausea And Vomiting        Medication List        Accurate as of September 19, 2021 11:59 PM. If you have any questions, ask your nurse or doctor.          acetaminophen 500 MG tablet Commonly known as: TYLENOL Take 500 mg by mouth 2 (two) times daily.   albuterol 108 (90 Base) MCG/ACT inhaler Commonly known as: VENTOLIN HFA Inhale 1-2 puffs into the lungs every 6 (six) hours as needed for wheezing or shortness of breath.   bisoprolol 5 MG tablet Commonly known as: ZEBETA Take 1 tablet (5 mg total) by mouth daily.   cefUROXime 500 MG tablet Commonly known as: CEFTIN Take 1 tablet (500 mg total) by mouth 2 (two) times daily with a meal. Started by: Danity Schmelzer, PA-C   CITRACAL + D PO Take 2 tablets by mouth daily.   dexlansoprazole 60 MG capsule Commonly known as: Dexilant Take 1 capsule (60 mg total) by mouth daily.   dorzolamide 2 % ophthalmic solution Commonly known as: TRUSOPT 1 drop 2 (two) times daily.   dutasteride 0.5 MG capsule Commonly known as: AVODART Take 1 capsule (0.5 mg total) by mouth every 3 (three) days.   Eliquis 5 MG Tabs tablet Generic drug: apixaban TAKE ONE TABLET TWICE DAILY   fluconazole 200 MG tablet Commonly known as: DIFLUCAN Take 200 mg by mouth once a week.    furosemide 40 MG tablet Commonly known as: LASIX TAKE 1 TABLET BY MOUTH EVERY OTHER DAY AS DIRECTED   glipiZIDE 5 MG 24 hr tablet Commonly known as: GLUCOTROL XL Take 5 mg by mouth daily.   ipratropium 0.06 % nasal spray Commonly known as: ATROVENT   latanoprost 0.005 % ophthalmic solution Commonly known as: XALATAN Place 1 drop into the right eye at bedtime.   losartan 50 MG tablet Commonly known as: COZAAR Take 50 mg by mouth daily.   Melatonin 10 MG Tabs Take 10 mg by mouth at bedtime.   metFORMIN 500 MG 24 hr tablet Commonly known as: GLUCOPHAGE-XR Take 500 mg by mouth in the morning and at  bedtime.   montelukast 10 MG tablet Commonly known as: SINGULAIR Take 10 mg by mouth daily.   multivitamin tablet Take 2 tablets by mouth daily.   omeprazole 40 MG capsule Commonly known as: PRILOSEC Take 40 mg by mouth every morning.   rosuvastatin 40 MG tablet Commonly known as: CRESTOR Take 1 tablet (40 mg total) by mouth daily.   zolpidem 5 MG tablet Commonly known as: AMBIEN Take 5 mg by mouth at bedtime as needed.        Allergies:  Allergies  Allergen Reactions   Beta Adrenergic Blockers Other (See Comments)    Fatigue   Oxycodone Nausea And Vomiting   Sulfonamide Derivatives Nausea And Vomiting    Family History: Family History  Problem Relation Age of Onset   Emphysema Father    Lung cancer Father    Cirrhosis Mother    Hypertension Brother    Hypertension Son    Heart attack Neg Hx    Stroke Neg Hx     Social History:  reports that he has never smoked. He has never used smokeless tobacco. He reports current alcohol use. He reports that he does not use drugs.  ROS: Pertinent ROS in HPI  Physical Exam: BP 105/64   Pulse 70   Ht 5' 6"  (1.676 m)   Wt 182 lb (82.6 kg)   BMI 29.38 kg/m   Constitutional:  Well nourished. Alert and oriented, No acute distress. HEENT: [place] AT, mask in place.  Trachea midline Cardiovascular: No clubbing,  cyanosis, or edema. Respiratory: Normal respiratory effort, no increased work of breathing. Neurologic: Grossly intact, no focal deficits, moving all 4 extremities. Psychiatric: Normal mood and affect.  Laboratory Data: Glucose 70 - 110 mg/dL 108   Sodium 136 - 145 mmol/L 142   Potassium 3.6 - 5.1 mmol/L 4.7   Chloride 97 - 109 mmol/L 108   Carbon Dioxide (CO2) 22.0 - 32.0 mmol/L 24.6   Urea Nitrogen (BUN) 7 - 25 mg/dL 16   Creatinine 0.7 - 1.3 mg/dL 0.8   Glomerular Filtration Rate (eGFR), MDRD Estimate >60 mL/min/1.73sq m 93   Calcium 8.7 - 10.3 mg/dL 9.7   AST  8 - 39 U/L 26   ALT  6 - 57 U/L 22   Alk Phos (alkaline Phosphatase) 34 - 104 U/L 56   Albumin 3.5 - 4.8 g/dL 3.9   Bilirubin, Total 0.3 - 1.2 mg/dL 0.8   Protein, Total 6.1 - 7.9 g/dL 6.1   A/G Ratio 1.0 - 5.0 gm/dL 1.8   Resulting Agency  [facility] - [facility]  Specimen Collected: 07/07/21 08:32 Last Resulted: 07/07/21 15:37  Received From: [facility]  Result Received: 07/11/21 15:46   Hemoglobin A1C 4.2 - 5.6 % 7.4 High    Average Blood Glucose (Calc) mg/dL 166   Resulting Agency  [facility] - [facility]  Narrative Performed by [facility] Normal Range:    4.2 - 5.6%  Increased Risk:  5.7 - 6.4%  Diabetes:        >= 6.5%  Glycemic Control for adults with diabetes:  <7%   Specimen Collected: 07/07/21 08:32 Last Resulted: 07/07/21 15:14  Received From: [facility]  Result Received: 07/11/21 15:46   Cholesterol, Total 100 - 200 mg/dL 115   Triglyceride 35 - 199 mg/dL 102   HDL (High Density Lipoprotein) Cholesterol 29.0 - 71.0 mg/dL 42.0   LDL Calculated 0 - 130 mg/dL 53  VLDL Cholesterol mg/dL 20   Cholesterol/HDL Ratio  2.7   Resulting Agency  [facility] - [facility]  Specimen Collected: 07/07/21 08:32 Last Resulted: 07/07/21 15:37  Received From: [facility]  Result Received: 07/11/21 15:46   Thyroid Stimulating Hormone (TSH)  0.450-5.330 uIU/ml uIU/mL 2.002   Resulting Agency  [facility] - [facility]  Specimen Collected: 07/07/21 08:32 Last Resulted: 07/07/21 13:00  Received From: [facility]  Result Received: 07/11/21 15:46   WBC (White Blood Cell Count) 4.1 - 10.2 10^3/uL 4.9   RBC (Red Blood Cell Count) 4.69 - 6.13 10^6/uL 4.09 Low    Hemoglobin 14.1 - 18.1 gm/dL 11.2 Low    Hematocrit 40.0 - 52.0 % 35.8 Low    MCV (Mean Corpuscular Volume) 80.0 - 100.0 fl 87.5   MCH (Mean Corpuscular Hemoglobin) 27.0 - 31.2 pg 27.4   MCHC (Mean Corpuscular Hemoglobin Concentration) 32.0 - 36.0 gm/dL 31.3 Low    Platelet Count 150 - 450 10^3/uL 152   RDW-CV (Red Cell Distribution Width) 11.6 - 14.8 % 14.2   MPV (Mean Platelet Volume) 9.4 - 12.4 fl 12.3   Neutrophils 1.50 - 7.80 10^3/uL 3.40   Lymphocytes 1.00 - 3.60 10^3/uL 1.00   Mixed Count 0.10 - 0.90 10^3/uL 0.50   Neutrophil % 32.0 - 70.0 % 69.7   Lymphocyte % 10.0 - 50.0 % 19.6   Mixed % 3.0 - 14.4 % 10.7   Resulting Agency  [facility] - LAB  Specimen Collected: 07/07/21 08:32 Last Resulted: 07/07/21 10:48  Received From: [employer]  Result Received: 07/07/21 13:00   Urinalysis Component     Latest Ref Rng & Units 09/19/2021  Specific Gravity, UA     1.005 - 1.030 1.015  pH, UA     5.0 - 7.5 5.5  Color, UA     Yellow Yellow  Appearance Ur     Clear Cloudy (A)  Leukocytes,UA     Negative 1+ (A)  Protein,UA     Negative/Trace 2+ (A)  Glucose, UA     Negative Negative  Ketones, UA     Negative Negative  RBC, UA     Negative 3+ (A)  Bilirubin, UA     Negative Negative  Urobilinogen, Ur     0.2 - 1.0 mg/dL 0.2  Nitrite, UA     Negative Positive (A)  Microscopic Examination      See below:   Component     Latest Ref Rng & Units 09/19/2021  WBC, UA     0 - 5 /hpf >30 (A)  RBC     0 - 2 /hpf >30 (A)  Epithelial Cells (non renal)     0 - 10 /hpf 0-10  Crystals     N/A Present (A)  Crystal  Type     N/A Amorphous Sediment  Mucus, UA     Not Estab. Present (A)  Bacteria, UA     None seen/Few Many (A)  I have reviewed the labs.   Pertinent Imaging: Results for [name], [name] (MRN [id number]) as of 09/19/2021 10:02  Ref. Range 09/19/2021 09:50  Scan Result Unknown 49ml  CLINICAL DATA:  stone   EXAM: ABDOMEN - 1 VIEW   COMPARISON:  May 15, 2020   FINDINGS: Air and stool-filled nondilated loops of bowel. Moderate colonic stool burden diffusely throughout the colon. Punctate radiopaque focus projecting over the region of the RIGHT kidney likely reflects known  nephrolithiasis; evaluation is limited secondary to overlying bowel contents. Degenerative changes of the lumbar spine. Surgical clips in the pelvis. Multiple pelvic phleboliths. Vascular calcifications. AICD.   IMPRESSION: Evaluation is limited secondary to bowel contents. Likely punctate RIGHT-sided nephrolithiasis. If persistent concern recommend further evaluation with dedicated noncontrast CT abdomen and pelvis.     Electronically Signed   By: Valentino Saxon M.D.   On: 09/20/2021 13:46 I have independently reviewed the films.  See HPI.    Assessment & Plan:    1. Gross hematuria -work up completed in 2021 - findings for BPH with bladder stones -reports of gross heme -UA > 30 RBC's - but likely due to infection -bladder stones seen on KUB -urine sent for culture -started on Ceftin 500 mg, BID x 7 days -I have explained to the patient that they will  be scheduled for a cystoscopy in our office to evaluate their bladder.  The cystoscopy consists of passing a tube with a lens up through their urethra and into their urinary bladder.   We will inject the urethra with a lidocaine gel prior to introducing the cystoscope to help with any discomfort during the procedure.   After the procedure, they might experience blood in the urine and discomfort with urination.  This will abate after the first few  voids.  I have  encouraged the patient to increase water intake  during this time.  Patient denies any allergies to lidocaine.    2. UTI with hematuria -see # 1  3. Bladder stones -see # 1  4. BPH with LU TS -request for Avodart refill, prescription sent to pharmacy -continue Avodart 0.5 mg three times weekly  Return for cysto for gross heme/bladder stones.  These notes generated with voice recognition software. I apologize for typographical errors.  Zara Council, PA-C  [facility] [address]  [place], [place] [address] [phone]

## 2021-09-19 ENCOUNTER — Ambulatory Visit (INDEPENDENT_AMBULATORY_CARE_PROVIDER_SITE_OTHER): Payer: Medicare Other | Admitting: Urology

## 2021-09-19 ENCOUNTER — Encounter: Payer: Self-pay | Admitting: Urology

## 2021-09-19 ENCOUNTER — Other Ambulatory Visit: Payer: Self-pay

## 2021-09-19 ENCOUNTER — Ambulatory Visit
Admission: RE | Admit: 2021-09-19 | Discharge: 2021-09-19 | Disposition: A | Discharge status: Home / Self Care | Payer: Medicare Other | Attending: Urology | Admitting: Urology

## 2021-09-19 ENCOUNTER — Ambulatory Visit
Admission: RE | Admit: 2021-09-19 | Discharge: 2021-09-19 | Disposition: A | Discharge status: Home / Self Care | Payer: Medicare Other | Source: Ambulatory Visit | Attending: Urology | Admitting: Urology

## 2021-09-19 VITALS — BP 105/64 | HR 70 | Ht 66.0 in | Wt 182.0 lb

## 2021-09-19 DIAGNOSIS — N3001 Acute cystitis with hematuria: Secondary | ICD-10-CM

## 2021-09-19 DIAGNOSIS — N200 Calculus of kidney: Secondary | ICD-10-CM | POA: Diagnosis not present

## 2021-09-19 DIAGNOSIS — R310 Gross hematuria: Secondary | ICD-10-CM

## 2021-09-19 DIAGNOSIS — I255 Ischemic cardiomyopathy: Secondary | ICD-10-CM | POA: Diagnosis not present

## 2021-09-19 DIAGNOSIS — N210 Calculus in bladder: Secondary | ICD-10-CM

## 2021-09-19 DIAGNOSIS — N400 Enlarged prostate without lower urinary tract symptoms: Secondary | ICD-10-CM | POA: Diagnosis not present

## 2021-09-19 LAB — MICROSCOPIC EXAMINATION
RBC, Urine: >30 /hpf — AB (ref 0–2)
WBC, UA: >30 /hpf — AB (ref 0–5)

## 2021-09-19 LAB — URINALYSIS, COMPLETE
Bilirubin, UA: NEGATIVE
Glucose, UA: NEGATIVE
Ketones, UA: NEGATIVE
Nitrite, UA: POSITIVE — AB
Specific Gravity, UA: 1.015 (ref 1.005–1.030)
Urobilinogen, Ur: 0.2 mg/dL (ref 0.2–1.0)
pH, UA: 5.5 (ref 5.0–7.5)

## 2021-09-19 LAB — BLADDER SCAN AMB NON-IMAGING

## 2021-09-19 MED ORDER — CEFUROXIME AXETIL 500 MG PO TABS: 500.0000 mg | ORAL_TABLET | Freq: Two times a day (BID) | ORAL | 0 refills | Status: DC

## 2021-09-19 MED ORDER — DUTASTERIDE 0.5 MG PO CAPS: 0.5000 mg | ORAL_CAPSULE | ORAL | 0 refills | Status: DC

## 2021-09-19 NOTE — Patient Instructions (Signed)
Cystoscopy Cystoscopy is a procedure that is used to help diagnose and sometimes treat conditions that affect the lower urinary tract. The lower urinary tract includes the bladder and the urethra. The urethra is the tube that drains urine from the bladder. Cystoscopy is done using a thin, tube-shaped instrument with a light and camera at the end (cystoscope). The cystoscope may be hard or flexible, depending on the goal of the procedure. The cystoscope is inserted through the urethra, into the bladder. Cystoscopy may be recommended if you have: Urinary tract infections that keep coming back. Blood in the urine (hematuria). An inability to control when you urinate (urinary incontinence) or an overactive bladder. Unusual cells found in a urine sample. A blockage in the urethra, such as a urinary stone. Painful urination. An abnormality in the bladder found during an intravenous pyelogram (IVP) or CT scan. Cystoscopy may also be done to remove a sample of tissue to be examined under a microscope (biopsy). What are the risks? Generally, this is a safe procedure. However, problems may occur, including: Infection. Bleeding.  What happens during the procedure?  You will be given one or more of the following: A medicine to numb the area (local anesthetic). The area around the opening of your urethra will be cleaned. The cystoscope will be passed through your urethra into your bladder. Germ-free (sterile) fluid will flow through the cystoscope to fill your bladder. The fluid will stretch your bladder so that your health care provider can clearly examine your bladder walls. Your doctor will look at the urethra and bladder. The cystoscope will be removed The procedure may vary among health care providers  What can I expect after the procedure? After the procedure, it is common to have: Some soreness or pain in your abdomen and urethra. Urinary symptoms. These include: Mild pain or burning when you  urinate. Pain should stop within a few minutes after you urinate. This may last for up to 1 week. A small amount of blood in your urine for several days. Feeling like you need to urinate but producing only a small amount of urine. Follow these instructions at home: General instructions Return to your normal activities as told by your health care provider.  Do not drive for 24 hours if you were given a sedative during your procedure. Watch for any blood in your urine. If the amount of blood in your urine increases, call your health care provider. If a tissue sample was removed for testing (biopsy) during your procedure, it is up to you to get your test results. Ask your health care provider, or the department that is doing the test, when your results will be ready. Drink enough fluid to keep your urine pale yellow. Keep all follow-up visits as told by your health care provider. This is important. Contact a health care provider if you: Have pain that gets worse or does not get better with medicine, especially pain when you urinate. Have trouble urinating. Have more blood in your urine. Get help right away if you: Have blood clots in your urine. Have abdominal pain. Have a fever or chills. Are unable to urinate. Summary Cystoscopy is a procedure that is used to help diagnose and sometimes treat conditions that affect the lower urinary tract. Cystoscopy is done using a thin, tube-shaped instrument with a light and camera at the end. After the procedure, it is common to have some soreness or pain in your abdomen and urethra. Watch for any blood in your urine.   If the amount of blood in your urine increases, call your health care provider. If you were prescribed an antibiotic medicine, take it as told by your health care provider. Do not stop taking the antibiotic even if you start to feel better. This information is not intended to replace advice given to you by your health care provider. Make  sure you discuss any questions you have with your health care provider. Document Revised: 11/29/2018 Document Reviewed: 11/29/2018 Elsevier Patient Education  2020 Elsevier Inc.  

## 2021-09-26 ENCOUNTER — Ambulatory Visit (INDEPENDENT_AMBULATORY_CARE_PROVIDER_SITE_OTHER): Payer: Medicare Other

## 2021-09-26 DIAGNOSIS — I255 Ischemic cardiomyopathy: Secondary | ICD-10-CM | POA: Diagnosis not present

## 2021-09-27 LAB — CULTURE, URINE COMPREHENSIVE

## 2021-09-30 ENCOUNTER — Telehealth: Payer: Self-pay | Admitting: Urology

## 2021-09-30 LAB — CUP PACEART REMOTE DEVICE CHECK
Battery Remaining Longevity: 9 mo
Battery Voltage: 2.86 V
Brady Statistic AP VP Percent: 97.82 %
Brady Statistic AP VS Percent: 0.02 %
Brady Statistic AS VP Percent: 2.15 %
Brady Statistic AS VS Percent: 0.01 %
Brady Statistic RA Percent Paced: 97.63 %
Brady Statistic RV Percent Paced: 99.91 %
Date Time Interrogation Session: 20221007022704
HighPow Impedance: 51 Ohm
HighPow Impedance: 65 Ohm
Implantable Lead Implant Date: 20070615
Implantable Lead Implant Date: 20120813
Implantable Lead Implant Date: 20120813
Implantable Lead Location: 753858
Implantable Lead Location: 753859
Implantable Lead Location: 753860
Implantable Lead Model: 4396
Implantable Lead Model: 5076
Implantable Lead Model: 7121
Implantable Pulse Generator Implant Date: 20170329
Lead Channel Impedance Value: 190 Ohm
Lead Channel Impedance Value: 304 Ohm
Lead Channel Impedance Value: 361 Ohm
Lead Channel Impedance Value: 456 Ohm
Lead Channel Impedance Value: 532 Ohm
Lead Channel Impedance Value: 836 Ohm
Lead Channel Pacing Threshold Amplitude: 1.25 V
Lead Channel Pacing Threshold Pulse Width: 0.4 ms
Lead Channel Sensing Intrinsic Amplitude: 0.5 mV
Lead Channel Sensing Intrinsic Amplitude: 0.5 mV
Lead Channel Sensing Intrinsic Amplitude: 6.25 mV
Lead Channel Sensing Intrinsic Amplitude: 6.25 mV
Lead Channel Setting Pacing Amplitude: 1.75 V
Lead Channel Setting Pacing Amplitude: 2 V
Lead Channel Setting Pacing Amplitude: 2.75 V
Lead Channel Setting Pacing Pulse Width: 0.4 ms
Lead Channel Setting Pacing Pulse Width: 0.8 ms
Lead Channel Setting Sensing Sensitivity: 0.3 mV

## 2021-09-30 NOTE — Telephone Encounter (Signed)
Pt returned call and I read message from [facility].

## 2021-09-30 NOTE — Progress Notes (Signed)
   10/01/2021  CC:  Chief Complaint  Patient presents with   Benign Prostatic Hypertrophy    HPI: [name]. is a 80 y.o. male with a personal history of elevated PSA, BPH with LUTS, bladder stones, and high risk hematuria, who presents today for a cystoscopy.   He is s/p prostate biopsies x3 with Dr. Elenor Quinones and Dr. Jacqlyn Larsen. His most recent PSA as of 05/2018 was 3.12.   Cystoscopy in 2021 revealed enlarged prostate w/ bilobar coaptation and saccules in diverticula and 8 or so stones not more than 8 mm, as well as chronic obstruction.   He is s/p UroLift on 06/2020 and is managed on dutasteride.  CTU in 2021 revealed numerous bladder stones measuring up to 8 mm in size. He is s/p cystolitholapaxy on 06/2020.   He is doing well today with no new urinary symptoms. He reports that the UroLift helped with his flow.    Vitals:   10/01/21 1339  BP: 121/77  Pulse: 73   NED. A&Ox3.   No respiratory distress   Abd soft, NT, ND Normal phallus with bilateral descended testicles  Cystoscopy Procedure Note  Patient identification was confirmed, informed consent was obtained, and patient was prepped using Betadine solution.  Lidocaine jelly was administered per urethral meatus.     Pre-Procedure: - Inspection reveals a normal caliber ureteral meatus.  Procedure: The flexible cystoscope was introduced without difficulty - No urethral strictures/lesions are present. Enlarged prostate . Nice anterior channel more coaptation near gland  - bilobar coaptation closer to bladder neck; mildly elevated bladder neck - Bilateral ureteral orifices identified - Bladder mucosa  reveals no ulcers, tumors, or lesions - 3 free floating bladder stones, no evidence of device erosion at bladder neck  - mild  trabeculation with saccule   Retroflexion shows 3 free floating bladder stones, no evidence of device erosion at bladder neck    Post-Procedure: - Patient tolerated the procedure  well  Assessment/ Plan: Recurrent bladder stones  - Cystoscopy revealed bladder stones   - Given his reoccurrence it is likely due to outlet obstruction   3. BPH w/outlet obstruction   - Discussed treatments of HoLEP, Urolift, and TURP  With concomitant cystolitholapaxy, and optimization of medicine.  - Each surgery was reviewed in detail today including preoperative, intraoperative, and postoperative course.   - Discussed risk of bleeding, infection, damage surrounding structures, injury to the bladder/ urethral, bladder neck contracture, ureteral stricture, retrograde ejaculation, stress/ urge incontinence, exacerbation of irritative voiding symptoms were all discussed in detail.    - He has elected to proceed with redo urolift with cystolitholapaxy . iIf this fails he would be more open to HoLEP .  Risk and benefits including risk of bleeding, infection, damage surrounding structures, recurrent bladder stones amongst others were all discussed.  -We will also plan to increase his dutasteride to daily  Follow-up with UroLift and cystolitholapaxy   I,Kailey Littlejohn,acting as a scribe for [name], MD.,have documented all relevant documentation on the behalf of [name], MD,as directed by  [name], MD while in the presence of [name], MD.

## 2021-10-01 ENCOUNTER — Ambulatory Visit (INDEPENDENT_AMBULATORY_CARE_PROVIDER_SITE_OTHER): Payer: Medicare Other | Admitting: Urology

## 2021-10-01 ENCOUNTER — Other Ambulatory Visit: Payer: Self-pay

## 2021-10-01 ENCOUNTER — Telehealth: Payer: Self-pay | Admitting: Urology

## 2021-10-01 ENCOUNTER — Other Ambulatory Visit: Payer: Self-pay | Admitting: Urology

## 2021-10-01 VITALS — BP 121/77 | HR 73 | Ht 66.0 in | Wt 180.0 lb

## 2021-10-01 DIAGNOSIS — N138 Other obstructive and reflux uropathy: Secondary | ICD-10-CM

## 2021-10-01 DIAGNOSIS — N210 Calculus in bladder: Secondary | ICD-10-CM

## 2021-10-01 DIAGNOSIS — R310 Gross hematuria: Secondary | ICD-10-CM | POA: Diagnosis not present

## 2021-10-01 NOTE — Telephone Encounter (Addendum)
Per Dr. Dr. Erlene Quan Patient is to be scheduled for Urolift redo and a Cystolitholapaxy.   Mr. [name] was contacted and possible surgical dates were discussed, 11/03/21 was agreed upon for surgery. Patient was instructed that Dr. Dr. Erlene Quan  will require them to provide a pre-op UA & CX prior to surgery. This was ordered and scheduled drop off appointment was made for 10/24/21 @ 10:00am.   Patient was directed to call [phone] between 1-3pm the day before surgery to find out surgical arrival time.  Instructions were given not to eat or drink from midnight on the night before surgery and have a driver for the day of surgery. On the surgery day patient was instructed to enter through the [place] entrance of [facility] report the Same Day Surgery desk.   Pre-Admit Testing will be in contact via phone to set up an interview with the anesthesia team to review your history and medications prior to surgery.   Reminder of this information was sent via mychart to the patient.   Patient is to hold anticoag's, OK to continue ASA per Dr. Erlene Quan.  Currently taking Eliquis by Cardiologist Dr. [name].

## 2021-10-01 NOTE — Progress Notes (Signed)
Surgical Physician Order Form  * Scheduling expectation : Next Available  *Length of Case:   *Clearance needed: no  *Anticoagulation Instructions: Hold all anticoagulants  *Aspirin Instructions: Ok to continue Aspirin  *Post-op visit Date/Instructions:  4-6 week w/PVR  *Diagnosis:  bladder stones/ BPH with BOO  *Procedure:  Urolift (redo); cystolitholapaxy  -Admit type: OUTpatient  -Anesthesia: General  -VTE Prophylaxis Standing Order SCD's       Other:   -Standing Lab Orders Per Anesthesia    Lab other: UA&Urine Culture  -Standing Test orders EKG/Chest x-ray per Anesthesia       Test other:   - Medications:     Ancef 2gm IV   Other Instructions:

## 2021-10-02 ENCOUNTER — Encounter: Payer: Self-pay | Admitting: Internal Medicine

## 2021-10-02 ENCOUNTER — Telehealth: Payer: Self-pay | Admitting: *Deleted

## 2021-10-02 NOTE — Telephone Encounter (Signed)
Request for pre-operative cardiac clearance Received: Today Ronnie Kitchens, NP  P Cv Div Preop Callback Request for pre-operative cardiac clearance:     1. What type of surgery is being performed?  CYSTOSCOPY WITH INSERTION OF UROLIFT; CYSTOSCOPY WITH LITHOLAPAXY   2. When is this surgery scheduled?  11/03/2021     3. Are there any medications that need to be held prior to surgery?  Apixaban   4. Practice name and name of physician performing surgery?  Performing surgeon: Dr. [name], MD  Requesting clearance: Ronnie Loh, FNP-C       5. Anesthesia type (none, local, MAC, general)? General   6. What is the office phone and fax number?    Phone: [phone]  Fax: [phone]   ATTENTION: Unable to create telephone message as per your standard workflow. Directed by [facility] providers to send requests for cardiac clearance to this pool for appropriate distribution to provider covering pre-operative clearances.   Ronnie Loh, MSN, APRN, FNP-C, CEN  [facility]  Peri-operative Services Nurse Practitioner  Phone: [phone]  10/02/21 1:12 PM

## 2021-10-02 NOTE — Progress Notes (Signed)
PERIOPERATIVE PRESCRIPTION FOR IMPLANTED CARDIAC DEVICE PROGRAMMING  Patient Information: Name:  [name].  DOB:  1941-01-20  MRN:  [id number]    Karen Kitchens, NP  P Cv Div Heartcare Device Planned Procedure:  CYSTOSCOPY WITH INSERTION OF UROLIFT; CYSTOSCOPY WITH LITHOLAPAXY  Surgeon:  Dr. [name], MD  Date of Procedure:  11/03/2021  Cautery will be used.   Please send documentation back to:  [facility] (Fax # [phone])   Honor Loh, MSN, APRN, FNP-C, Tina Nurse Practitioner  Phone: [phone]  10/02/21 1:14 PM  Device Information:  Clinic EP Physician:  Virl Axe, MD   Device Type:  Defibrillator Manufacturer and Phone #:  Medtronic: [id number] Pacemaker Dependent?:  Yes.   Date of Last Device Check:  09/26/21 (remote)  04/01/21 (in-clinic)  Normal Device Function?:  Yes.    Electrophysiologist's Recommendations:  Have magnet available. Provide continuous ECG monitoring when magnet is used or reprogramming is to be performed.  Procedure may interfere with device function.  Magnet should be placed over device during procedure.  Per Device Clinic Standing Orders, York Ram, RN  2:10 PM 10/02/2021

## 2021-10-02 NOTE — Progress Notes (Signed)
[facility] Posting Form   Surgery Date/Time: Date: 11/03/2021  Surgeon: Dr. 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

## 2021-10-03 NOTE — Telephone Encounter (Signed)
Patient with diagnosis of afib on Eliquis for anticoagulation.    Procedure: cystoscopy, urolift Date of procedure: 11/03/21  CHA2DS2-VASc Score = 8  This indicates a 10.8% annual risk of stroke. The patient's score is based upon: CHF History: 1 HTN History: 1 Diabetes History: 1 Stroke History: 2 Vascular Disease History: 1 Age Score: 2 Gender Score: 0   CrCl 63mL/min using adjusted body weight Platelet count 152K  Prefer to minimize time off of Eliquis due to pt's elevated CV risk. Procedure is moderate-bleed risk and may require 2 day Eliquis hold (request didn't specify). Will forward to MD to see if 2 day Eliquis hold is acceptable given pt's elevated CV risk.

## 2021-10-07 NOTE — Progress Notes (Signed)
Remote ICD transmission.   

## 2021-10-08 LAB — URINALYSIS, COMPLETE
Bilirubin, UA: NEGATIVE
Glucose, UA: NEGATIVE
Ketones, UA: NEGATIVE
Nitrite, UA: NEGATIVE
Specific Gravity, UA: 1.015 (ref 1.005–1.030)
Urobilinogen, Ur: 0.2 mg/dL (ref 0.2–1.0)
pH, UA: 5 (ref 5.0–7.5)

## 2021-10-08 LAB — MICROSCOPIC EXAMINATION

## 2021-10-13 NOTE — Telephone Encounter (Signed)
   Name: [name].  DOB: [birth year]  MRN: [id number]  Primary Cardiologist: Virl Axe, MD  Chart reviewed as part of pre-operative protocol coverage.   80 year old male with coronary artery disease, status post PCI to the LAD in 1995, ischemic cardiomyopathy, status post ICD, atrial fibrillation on chronic anticoagulation with Apixaban, hypertension, hyperlipidemia, diabetes, prior TIA.  Myoview 09/24/2020: No ischemia, EF 46, low risk Echo 07/06/2019: EF 50-55  Patient was last seen by Dr. [name] in April 2022 with plans for 46-month follow-up.  Notes indicate the patient will be placed under general anesthesia for the procedure.  Because of ASHFORD CLOUSE Jr.'s past medical history and time since last visit, he will require a follow-up visit in order to better assess preoperative cardiovascular risk.  Pre-op covering staff: - Please schedule appointment and call patient to inform them. If patient already had an upcoming appointment within acceptable timeframe, please add "pre-op clearance" to the appointment notes so provider is aware. - Please contact requesting surgeon's office via preferred method (i.e, phone, fax) to inform them of need for appointment prior to surgery.  If applicable, this message will also be routed to pharmacy pool and/or primary cardiologist for input on holding anticoagulant/antiplatelet agent as requested below so that this information is available to the clearing provider at time of patient's appointment.   Richardson Dopp, PA-C  10/13/2021, 8:46 AM

## 2021-10-13 NOTE — Telephone Encounter (Signed)
To Otho to schedule sooner appt with Dr [name].

## 2021-10-14 NOTE — Telephone Encounter (Signed)
Sooner appt not available pease advise

## 2021-10-15 NOTE — Telephone Encounter (Signed)
See notes from Dr. Olin Pia nurse Nira Conn. RN sent message to scheduler to schedule the pt appt 10/16/21 9 am with Dr. [name]. I will forward notes to MD for upcoming appt. Will send FYI to requesting office pt has appt. I s/w the pt and he is agreeable to plan of care for pre op appt. 10/16/21 @ 9 am.

## 2021-10-15 NOTE — Telephone Encounter (Signed)
Ok to add on for 10/16/21 at 9:00 am with Dr. [name].  Patient does have a Medtronic ICD.

## 2021-10-16 ENCOUNTER — Ambulatory Visit (INDEPENDENT_AMBULATORY_CARE_PROVIDER_SITE_OTHER): Payer: Medicare Other | Admitting: Internal Medicine

## 2021-10-16 ENCOUNTER — Other Ambulatory Visit: Payer: Self-pay

## 2021-10-16 ENCOUNTER — Encounter: Payer: Self-pay | Admitting: Internal Medicine

## 2021-10-16 VITALS — BP 142/90 | HR 61 | Ht 66.0 in | Wt 182.0 lb

## 2021-10-16 DIAGNOSIS — Z95810 Presence of automatic (implantable) cardiac defibrillator: Secondary | ICD-10-CM | POA: Diagnosis not present

## 2021-10-16 DIAGNOSIS — I442 Atrioventricular block, complete: Secondary | ICD-10-CM

## 2021-10-16 DIAGNOSIS — I5022 Chronic systolic (congestive) heart failure: Secondary | ICD-10-CM | POA: Diagnosis not present

## 2021-10-16 DIAGNOSIS — I255 Ischemic cardiomyopathy: Secondary | ICD-10-CM | POA: Diagnosis not present

## 2021-10-16 DIAGNOSIS — I480 Paroxysmal atrial fibrillation: Secondary | ICD-10-CM

## 2021-10-16 NOTE — Patient Instructions (Addendum)
Medication Instructions:  - Your physician recommends that you continue on your current medications as directed. Please refer to the Current Medication list given to you today.  *If you need a refill on your cardiac medications before your next appointment, please call your pharmacy*   Lab Work: - none ordered  If you have labs (blood work) drawn today and your tests are completely normal, you will receive your results only by: [place] (if you have MyChart) OR A paper copy in the mail If you have any lab test that is abnormal or we need to change your treatment, we will call you to review the results.   Testing/Procedures: - none ordered   Follow-Up: At [facility], you and your health needs are our priority.  As part of our continuing mission to provide you with exceptional heart care, we have created designated Provider Care Teams.  These Care Teams include your primary Cardiologist (physician) and Advanced Practice Providers (APPs -  Physician Assistants and Nurse Practitioners) who all work together to provide you with the care you need, when you need it.  We recommend signing up for the patient portal called "MyChart".  Sign up information is provided on this After Visit Summary.  MyChart is used to connect with patients for Virtual Visits (Telemedicine).  Patients are able to view lab/test results, encounter notes, upcoming appointments, etc.  Non-urgent messages can be sent to your provider as well.   To learn more about what you can do with MyChart, go to [email or web address].    Your next appointment:   6 month(s)  The format for your next appointment:   In Person  Provider:   Virl Axe, MD   Other Instructions Listed below are contacts for a few of the [place] that some of our patient's have used and had success with:   1) [facility]  Website: [email or web address]   2) [facility]  Website: [email or web address]  Phone:  [phone]  Fax: [phone]  Email: [email or web address]

## 2021-10-16 NOTE — Progress Notes (Signed)
Patient Care Team: Derinda Late, MD as PCP - General (Family Medicine) [name], MD as PCP - Cardiology (Cardiology) Rocco Serene, MD (Unknown Physician Specialty)   HPI  [name]. is a 80 y.o. male Seen in followup for ICD implantation initially for syncope in the setting of depressed left ventricular function.  And ischemic heart disease.  He had a 6949-lead. He reached ERI summer 2012 and underwent device explantation with new defibrillator lead insertion with left ventricular lead placement and generator replacement.   He underwent device generator replacement again 3/17    Atrial fibrillation persistent  anticoagulation with Apixoban without bleeding -- but Hgb noted to be low at blood work 7/22 (See Below)   Complaints are mostly aches and pains; The patient denies chest pain, shortness of breath, nocturnal dyspnea, orthopnea or peripheral edema.  There have been no palpitations, lightheadedness or syncope.       February 2022 developed upper extremity stiffness and difficulty using his hands.  Thought related to inflammation and got a steroid injection with significant interval improvement.  Also complains of bilateral upper extremity, mostly hand swelling.  Saw PCP and was referred to orthopedics and it has been attributed to carpal tunnel and has had interval surgery on both hands with some improvement in both function and swelling.  But not normal  Recurrent kidney stones and here for preop clearance.        DATE TEST EF   2012 Cath  40 % Patent LAD stent  12/15 Echo   55-65 %   7/20 Echo  50-55%   10/21 Myoview  46% No Ischemia          Date K Cr Hgb LDL  6/18 4.3 0.9 14   2/19 4.1 0.9 12.5   3/21 4.6 0.92 13.8   2/22 4.2 0.8 12.2 76  7/22 4.7 0.8 11.2 53    Has moved to independent living and loves it   Thromboembolic risk factors ( age  -2, HTN-1, TIA/CVA-2, Vasc disease -1, CHF -1) for a CHADSVASc Score of >=7     Past Medical  History:  Diagnosis Date   AICD (automatic cardioverter/defibrillator) present    Allergic rhinitis    Arthritis    Asthma    Biventricular defibrillator-Medtronic    Generator replacement 2012 with CRT upgrade   BPH (benign prostatic hyperplasia)    Cardiac arrest ([place])    Chronic lower back pain    Diabetes mellitus without complication ([place])    BORDER LINE   Dyspnea    PFT 12/19/10: FEV1 2.93(107%), FEV1% 72, TLC 8.02(138%), DLCO 110%, +BD   ED (erectile dysfunction)    GERD (gastroesophageal reflux disease)    GERD (gastroesophageal reflux disease)    High-grade heart block    History of kidney stones    Hyperlipidemia    Hypertension    ischemic cardiomyopathy    stent to a 95%-occluded LAD in 1995; catheterization in 2012 demonstrated patent grafts and nonobstructive disease   Myocardial infarction [facility])    Sciatic nerve pain    Severe sinus bradycardia    Syncope        TIA (transient ischemic attack) nov. 2011    Past Surgical History:  Procedure Laterality Date   CATARACT EXTRACTION, BILATERAL     COLONOSCOPY WITH PROPOFOL N/A 04/01/2018   Procedure: COLONOSCOPY WITH PROPOFOL;  Surgeon: [name], MD;  [facility] ENDOSCOPY;  Service: Endoscopy;  Laterality: N/A;   CORONARY ANGIOPLASTY  CYSTOSCOPY WITH INSERTION OF UROLIFT N/A 07/15/2020   Procedure: CYSTOSCOPY WITH INSERTION OF UROLIFT;  Surgeon: [name], MD;  Location: [facility] ORS;  Service: Urology;  Laterality: N/A;   CYSTOSCOPY WITH LITHOLAPAXY N/A 07/15/2020   Procedure: CYSTOSCOPY WITH LITHOLAPAXY;  Surgeon: [name], MD;  Location: [facility] ORS;  Service: Urology;  Laterality: N/A;   EP IMPLANTABLE DEVICE N/A 03/18/2016   Procedure:  BiV ICD Generator Changeout;  Surgeon: [name], MD;  Location: [place] CV LAB;  Service: Cardiovascular;  Laterality: N/A;   ESOPHAGOGASTRODUODENOSCOPY (EGD) WITH PROPOFOL N/A 04/01/2018   Procedure: ESOPHAGOGASTRODUODENOSCOPY (EGD) WITH PROPOFOL;   Surgeon: [name], MD;  Location: [facility] ENDOSCOPY;  Service: Endoscopy;  Laterality: N/A;   EYE SURGERY Bilateral    cataract extraction   Implantation of Medtronic dual-chamber cardioverter     JOINT REPLACEMENT Left    total knee replacement   Left Knee Replacement  2006   PROSTATE BIOPSY     TONSILLECTOMY  1948   TOTAL KNEE REVISION Left 06/20/2015   Procedure: TOTAL KNEE REVISION;  Surgeon: [name], MD;  Location: [facility] ORS;  Service: Orthopedics;  Laterality: Left;    Current Outpatient Medications  Medication Sig Dispense Refill   acetaminophen (TYLENOL) 500 MG tablet Take 500 mg by mouth 2 (two) times daily.      albuterol (VENTOLIN HFA) 108 (90 Base) MCG/ACT inhaler Inhale 1-2 puffs into the lungs every 6 (six) hours as needed for wheezing or shortness of breath.     bisoprolol (ZEBETA) 5 MG tablet Take 1 tablet (5 mg total) by mouth daily. 30 tablet 6   Calcium Citrate-Vitamin D (CITRACAL + D PO) Take 2 tablets by mouth daily.      dorzolamide (TRUSOPT) 2 % ophthalmic solution 1 drop 2 (two) times daily.      dutasteride (AVODART) 0.5 MG capsule Take 1 capsule (0.5 mg total) by mouth every 3 (three) days. (Patient taking differently: Take 0.5 mg by mouth daily.) 90 capsule 0   ELIQUIS 5 MG TABS tablet TAKE ONE TABLET TWICE DAILY 180 tablet 1   fluconazole (DIFLUCAN) 200 MG tablet Take 200 mg by mouth once a week.     furosemide (LASIX) 40 MG tablet TAKE 1 TABLET BY MOUTH EVERY OTHER DAY AS DIRECTED (Patient taking differently: TAKE 1 TABLET BY MOUTH EVERY THIRD DAY AS DIRECTED) 30 tablet 11   glipiZIDE (GLUCOTROL XL) 5 MG 24 hr tablet Take 5 mg by mouth daily.     ipratropium (ATROVENT) 0.06 % nasal spray      latanoprost (XALATAN) 0.005 % ophthalmic solution Place 1 drop into the right eye at bedtime.      losartan (COZAAR) 50 MG tablet Take 50 mg by mouth daily.     Melatonin 10 MG TABS Take 10 mg by mouth every other day.     metFORMIN (GLUCOPHAGE-XR) 500 MG 24 hr  tablet Take 500 mg by mouth in the morning and at bedtime.      montelukast (SINGULAIR) 10 MG tablet Take 10 mg by mouth daily.      Multiple Vitamin (MULTIVITAMIN) tablet Take 2 tablets by mouth daily.     omeprazole (PRILOSEC) 40 MG capsule Take 40 mg by mouth every morning.     rosuvastatin (CRESTOR) 40 MG tablet Take 1 tablet (40 mg total) by mouth daily. 90 tablet 2   zolpidem (AMBIEN) 5 MG tablet Take 5 mg by mouth every other day.     No current facility-administered  medications for this visit.    Allergies  Allergen Reactions   Beta Adrenergic Blockers Other (See Comments)    Fatigue   Oxycodone Nausea And Vomiting   Sulfonamide Derivatives Nausea And Vomiting    Review of Systems negative except from HPI and PMH  Physical Exam BP (!) 142/90 (BP Location: Left Arm, Patient Position: Sitting, Cuff Size: Normal)   Pulse 61   Ht 5\' 6"  (1.676 m)   Wt 182 lb (82.6 kg)   SpO2 97%   BMI 29.38 kg/m  Well developed and well nourished in no acute distress HENT normal Neck supple with JVP-flat Clear Device pocket well healed; without hematoma or erythema.  There is no tethering  Regular rate and rhythm, no  murmur Abd-soft with active BS No Clubbing cyanosis  edema Skin-warm and dry A & Oriented  Grossly normal sensory and motor function  Remote reviewed from 10/22  Est 9 m to ERI   ECG AV pacing @ 61   Assessment and  Plan \ Ischemic Cardiomyopathy -- interval normalization prior LAD stenting  Complete heart block    Congestive heart failure acute/chronic/diastolic  Implantable defibrillator-CRT Medtronic   Atrial fibrillation paroxysmal  Bradycardia-sinus  Hypertension  Hyperlipidemia  Anemia   LDL now at goal  continue rosuvastatin 40 daily  Afib no interval  No bleeding continue Apixoban  at 5mg  bid but there is an issue of mildly WORSENING ANEMIA and this needs to be followed   he will be getting preop blood work and CBC SHOULD BE RECHECKED  then  Device approaching ERI   Without symptoms of ischemia.  Continue bisoprolol 5  and losartan 50 mg daily Not on asa 2/2 anticoagulation   CV risk for surgery should be acceptable with functional status > 4 METS and no acute changes T The issue of anticoagulation is a little trickier given that he has had prior stroke. I would favor, if OK with urology, would be to hold three doses  and then resume ASAP

## 2021-10-16 NOTE — Telephone Encounter (Signed)
The patient was seen in clinic today for surgical clearance.   Secure chat sent to Honor Loh, NP in pre-op advising to see Dr. Olin Pia office note from today regarding statement of clearance/ anticoagulation recommendations.  Per Gaspar Bidding, message received.  Closing this encounter.

## 2021-10-21 NOTE — Progress Notes (Signed)
No ICM remote transmission received for 10/20/2021 and next ICM transmission scheduled for 11/03/2021.

## 2021-10-23 ENCOUNTER — Other Ambulatory Visit: Payer: Self-pay

## 2021-10-23 DIAGNOSIS — N210 Calculus in bladder: Secondary | ICD-10-CM

## 2021-10-24 ENCOUNTER — Other Ambulatory Visit: Payer: Medicare Other

## 2021-10-24 ENCOUNTER — Other Ambulatory Visit: Payer: Self-pay

## 2021-10-24 ENCOUNTER — Telehealth: Payer: Self-pay | Admitting: Urology

## 2021-10-24 DIAGNOSIS — N400 Enlarged prostate without lower urinary tract symptoms: Secondary | ICD-10-CM

## 2021-10-24 DIAGNOSIS — N210 Calculus in bladder: Secondary | ICD-10-CM

## 2021-10-24 LAB — URINALYSIS, COMPLETE
Bilirubin, UA: NEGATIVE
Glucose, UA: NEGATIVE
Nitrite, UA: POSITIVE — AB
Specific Gravity, UA: 1.02 (ref 1.005–1.030)
Urobilinogen, Ur: 0.2 mg/dL (ref 0.2–1.0)
pH, UA: 5.5 (ref 5.0–7.5)

## 2021-10-24 LAB — MICROSCOPIC EXAMINATION: WBC, UA: >30 /HPF — AB (ref 0–5)

## 2021-10-24 MED ORDER — DUTASTERIDE 0.5 MG PO CAPS: 0.5000 mg | ORAL_CAPSULE | ORAL | 0 refills | Status: DC

## 2021-10-24 NOTE — Telephone Encounter (Signed)
Patient is requesting a refill of Dutasteride.  Pharmacy is [facility] in [place].  He is requesting that rx be sent for 90 days

## 2021-10-25 ENCOUNTER — Other Ambulatory Visit: Payer: Self-pay | Admitting: Urology

## 2021-10-25 DIAGNOSIS — N400 Enlarged prostate without lower urinary tract symptoms: Secondary | ICD-10-CM

## 2021-10-27 ENCOUNTER — Encounter: Payer: Self-pay | Admitting: Internal Medicine

## 2021-10-27 ENCOUNTER — Encounter
Admission: RE | Admit: 2021-10-27 | Discharge: 2021-10-27 | Disposition: A | Discharge status: Home / Self Care | Payer: Medicare Other | Source: Ambulatory Visit | Attending: Urology | Admitting: Urology

## 2021-10-27 ENCOUNTER — Other Ambulatory Visit: Payer: Self-pay

## 2021-10-27 LAB — CULTURE, URINE COMPREHENSIVE

## 2021-10-27 NOTE — Patient Instructions (Signed)
Your procedure is scheduled on: 11/03/21 Report to [place]. To find out your arrival time please call [phone] between 1PM - 3PM on 10/31/21.  Remember: Instructions that are not followed completely may result in serious medical risk, up to and including death, or upon the discretion of your surgeon and anesthesiologist your surgery may need to be rescheduled.     _X__ 1. Do not eat food or drink any liquids after midnight the night before your procedure.                 No gum chewing or hard candies.   __X__2.  On the morning of surgery brush your teeth with toothpaste and water, you                 may rinse your mouth with mouthwash if you wish.  Do not swallow any              toothpaste of mouthwash.     _X__ 3.  No Alcohol for 24 hours before or after surgery.   _X__ 4.  Do Not Smoke or use e-cigarettes For 24 Hours Prior to Your Surgery.                 Do not use any chewable tobacco products for at least 6 hours prior to                 surgery.  ____  5.  Bring all medications with you on the day of surgery if instructed.   __X__  6.  Notify your doctor if there is any change in your medical condition      (cold, fever, infections).     Do not wear jewelry, make-up, hairpins, clips or nail polish. Do not wear lotions, powders, or perfumes.  Do not shave body hair 48 hours prior to surgery. Men may shave face and neck. Do not bring valuables to the hospital.    [facility] is not responsible for any belongings or valuables.  Contacts, dentures/partials or body piercings may not be worn into surgery. Bring a case for your contacts, glasses or hearing aids, a denture cup will be supplied. Leave your suitcase in the car. After surgery it may be brought to your room. For patients admitted to the hospital, discharge time is determined by your treatment team.   Patients discharged the day of surgery will not be  allowed to drive home.   Please read over the following fact sheets that you were given:     __X__ Take these medicines the morning of surgery with A SIP OF WATER:    1. bisoprolol (ZEBETA) 5 MG tablet  2. dutasteride (AVODART) 0.5 MG capsule  3. omeprazole (PRILOSEC) 40 MG capsule  4. rosuvastatin (CRESTOR) 40 MG tablet  5.  6.  ____ Fleet Enema (as directed)   ____ Use CHG Soap/SAGE wipes as directed  __X__ Use inhalers on the day of surgery  __X__ Stop metformin/Janumet/Farxiga 2 days prior to surgery  Last dose Saturday morning  ____ Take 1/2 of usual insulin dose the night before surgery. No insulin the morning          of surgery.   __X__ Hold Eliquis for 3 doses  __X__ Stop Anti-inflammatories 7 days before surgery such as Advil, Ibuprofen, Motrin,  BC or Goodies Powder, Naprosyn, Naproxen, Aleve, Aspirin    __X__ Stop all herbals and supplements, fish oil  andr vitamins  until after surgery.    ____ Bring C-Pap to the hospital.

## 2021-10-27 NOTE — Progress Notes (Signed)
PERIOPERATIVE PRESCRIPTION FOR IMPLANTED CARDIAC DEVICE PROGRAMMING  Patient Information: Name:  Antoino Westhoff.  DOB:  [birth year]  MRN:  [id number]    Planned Procedure:  CYSTOSCOPY WITH INSERTION OF UROLIFT AND LITHOLAPAXY  Surgeon:  Dr. [name], MD  Date of Procedure:  11/03/2021  Cautery will be used.   Please send documentation back to:  Route note to my attention in [facility] or fax to me in the [facility] PAT office at (Fax # [phone])  Device Information:  Clinic EP Physician:  Virl Axe, MD   Device Type:  Defibrillator Manufacturer and Phone #:  Medtronic: [id number] Pacemaker Dependent?:  Unknown Date of Last Device Check:  10/16/21 Normal Device Function?:  Yes.    Electrophysiologist's Recommendations:  Have magnet available. Provide continuous ECG monitoring when magnet is used or reprogramming is to be performed.  Procedure should not interfere with device function.  No device programming or magnet placement needed.  Per Device Clinic Standing Orders, Simone Curia, RN  5:05 PM 10/27/2021

## 2021-10-28 ENCOUNTER — Telehealth: Payer: Self-pay

## 2021-10-28 ENCOUNTER — Encounter: Payer: Self-pay | Admitting: Urology

## 2021-10-28 MED ORDER — SULFAMETHOXAZOLE-TRIMETHOPRIM 800-160 MG PO TABS: 1.0000 | ORAL_TABLET | Freq: Two times a day (BID) | ORAL | 0 refills | Status: DC

## 2021-10-28 NOTE — Telephone Encounter (Signed)
Spoke with pt. Pt. Advised of results and verbalized understanding. We also discussed holding 3 doses of Eliquis prior to Surgery per Dr. Olin Pia recommendation.

## 2021-10-28 NOTE — Telephone Encounter (Signed)
-----   Message from [name], MD sent at 10/27/2021  2:15 PM EST ----- Please treat with Bactrim DS twice daily for total of 7 days starting 5 days before the procedure.  [name], MD

## 2021-10-29 ENCOUNTER — Encounter: Payer: Self-pay | Admitting: Urology

## 2021-10-29 NOTE — Progress Notes (Signed)
Perioperative Services  Pre-Admission/Anesthesia Testing Clinical Review  Date: 10/29/21  Patient Demographics:  Name: [name]. DOB:   08-24-1941 MRN:   [id number]  Planned Surgical Procedure(s):    Case: [id number] Date/Time: 11/03/21 0730   Procedures:      CYSTOSCOPY WITH INSERTION OF UROLIFT     CYSTOSCOPY WITH LITHOLAPAXY   Anesthesia type: General   Pre-op diagnosis: BPH with Bladder Outlet Obstruction, bladder stones   Location: [facility] OR ROOM 10 / [place] [facility]S FOR ANESTHESIA GROUP   Surgeons: [name], MD   NOTE: Available PAT nursing documentation and vital signs have been reviewed. Clinical nursing staff has updated patient's PMH/PSHx, current medication list, and drug allergies/intolerances to ensure comprehensive history available to assist in medical decision making as it pertains to the aforementioned surgical procedure and anticipated anesthetic course. Extensive review of available clinical information performed. [place] PMH and PSHx updated with any diagnoses/procedures that  may have been inadvertently omitted during his intake with the pre-admission testing department's nursing staff.  Clinical Discussion:  Ronnie Salton. is a 80 y.o. male who is submitted for pre-surgical anesthesia review and clearance prior to him undergoing the above procedure. Patient has never been a smoker. Pertinent PMH includes: CAD, MI, cardiac arrest, atrial fibrillation, HFpEF, complete heart block (s/p AICD placement), ischemic cardiomyopathy, severe bradycardia, aortic atherosclerosis, HTN, HLD, T2DM, GERD (on daily PPI), asthma, SOB, anemia, OA, chronic lower back pain, BPH, nephrolithiasis.  Patient is followed by cardiology [name], MD). He was last seen in the cardiology clinic on 10/16/2021; notes reviewed.  At the time of his clinic visit, patient doing well overall from a cardiovascular perspective.  He denied any episodes of chest pain, increase shortness of  breath, PND, orthopnea, palpitations, significant peripheral edema, vertiginous symptoms, or presyncope/syncope.  PMH significant for cardiovascular diagnoses.  Patient reported history of MI, however unable to determine when this occurred.  Presumably, cardiac event occurred on 01/14/1994 when patient went into cardiac arrest.  ROSC was achieved and patient was taken to the cardiac catheterization lab.  Diagnostic left heart catheterization revealed a 95% stenosis of the proximal LAD.  PTCA was performed, however adequate flow was not achieved.  Subsequently, PCI was performed placing a 3.5 x 15 mm JJIS stent to the proximal LAD resulting in 0% residual stenosis and TIMI-3 flow.  Patient reported to have undergone a second diagnostic left heart catheterization sometime in 2007 while in [place].  Procedure resulted in placement of an additional stent, however type and location unknown at the time of consult.  Patient developed severe bradycardia in 2007.  ECG tracings revealed high grade (complete) heart block.  He underwent Medtronic ICD placement in 2007.  Device was upgraded to a CRT-D and 07/2011.  Generator was changed in 02/2016.  TTE performed on 10/30/2010 revealed a severely reduced left ventricular systolic function with an EF of 30-35%.  There was severe hypokinesis of anterior wall and apex.  Per primary cardiologist, patient underwent repeat cardiac catheterization sometime in 2012.  Catheterization report unavailable for review at time of consult, however cardiologist noted a patent stent within the LAD and nonobstructive disease.  Last TTE was performed on 07/06/2019 revealing a low normal left ventricular systolic function with an EF of 50-55%.  The left atrium was mild to moderate biatrial enlargement.  There was trivial tricuspid and pulmonary valve regurgitation.  There was no significant transvalvular gradient to suggest stenosis.  Myocardial perfusion imaging study was  performed on 09/24/2020  revealing a decreased left ventricular ejection fraction of 46%.  There was a small, mild perfusion defect in the basal and lateral region, which was most likely felt to represent artifact, however scar cannot be excluded.  There was no evidence of significant ischemia.  Interpreting cardiologist noted that sensitivity specificity degraded by artifact.  Patient with a diagnosis of atrial fibrillation; CHA2DS2-VASc Score = 8 (age x 2, HFpEF, HTN, TIA x 2, prior MI, T2DM).  Dysrhythmia paroxysmal in nature; rate and rhythm maintained on bisoprolol.  Patient chronically anticoagulated using daily apixaban; compliant with therapy with no evidence of reports of GI bleeding.  Blood pressure elevated at 142/90 on prescribed beta-blocker, diuretic, and ARB therapies.  Patient is on a statin for his HLD.  T2DM reasonably controlled on currently prescribed regimen; last Hgb A1c was 7.4% when checked on 07/07/2021.  Functional capacity limited by arthritis and underlying cardiopulmonary diagnoses, however patient felt to be able to achieve at least 4 METS of activity.  No changes were made to his medication regimen.  Patient follow-up with outpatient cardiology in 6 months or sooner if needed.  [name]. is scheduled for an CYSTOSCOPY WITH LITHOLAPAXY AND INSERTION OF UROLIFT on 11/03/2021 with Dr. [name], MD.  Given patient's past medical history significant for cardiovascular diagnoses, presurgical cardiac clearance was sought by the PAT team.  Per cardiology, "cardiovascular risk for surgery should be ACCEPTABLE with functional status >4 METS and no acute changes".  Again, this patient is on daily anticoagulation therapy.  Given his previous history of TIA, cardiology hesitant about holding patient's anticoagulation therapy.  Multidisciplinary discussions between cardiology and urology led to the plan of having patient hold his apixaban for 3 DOSES prior to his procedure.   Has been made aware that his last dose of apixaban should be on the morning of 11/02/2021.  Patient denies previous perioperative complications with anesthesia in the past. In review of the available records, it is noted that patient underwent a general anesthetic course here (ASA IV) in 06/2020 without documented complications.   Vitals with BMI 10/27/2021 10/16/2021 10/01/2021  Height 5' 6"  5' 6"  5' 6"   Weight 180 lbs 182 lbs 180 lbs  BMI 29.07 34.19 62.22  Systolic - 979 892  Diastolic - 90 77  Pulse - 61 73    Providers/Specialists:   NOTE: Primary physician provider listed below. Patient may have been seen by APP or partner within same practice.   PROVIDER ROLE / SPECIALTY LAST Lu Duffel, MD UROLOGY 10/01/2021  Derinda Late, MD PRIMARY CARE PROVIDER 07/14/2021  Virl Axe, MD CARDIOLOGY/ELECTROPHYSIOLOGY 10/16/2021   Allergies:  Beta adrenergic blockers, Oxycodone, and Sulfonamide derivatives  Current Home Medications:   No current facility-administered medications for this encounter.    acetaminophen (TYLENOL) 500 MG tablet   albuterol (VENTOLIN HFA) 108 (90 Base) MCG/ACT inhaler   bisoprolol (ZEBETA) 5 MG tablet   Calcium Citrate-Vitamin D (CITRACAL + D PO)   dorzolamide (TRUSOPT) 2 % ophthalmic solution   ELIQUIS 5 MG TABS tablet   fluconazole (DIFLUCAN) 200 MG tablet   furosemide (LASIX) 40 MG tablet   glipiZIDE (GLUCOTROL XL) 5 MG 24 hr tablet   hydroxypropyl methylcellulose / hypromellose (ISOPTO TEARS / GONIOVISC) 2.5 % ophthalmic solution   ipratropium (ATROVENT) 0.06 % nasal spray   latanoprost (XALATAN) 0.005 % ophthalmic solution   losartan (COZAAR) 50 MG tablet   Melatonin 10 MG TABS   metFORMIN (GLUCOPHAGE-XR) 500 MG 24 hr tablet  montelukast (SINGULAIR) 10 MG tablet   Multiple Vitamin (MULTIVITAMIN) tablet   omeprazole (PRILOSEC) 40 MG capsule   Phenylephrine HCl (SINEX REGULAR NA)   rosuvastatin (CRESTOR) 40 MG tablet   zolpidem  (AMBIEN) 5 MG tablet   dutasteride (AVODART) 0.5 MG capsule   sulfamethoxazole-trimethoprim (BACTRIM DS) 800-160 MG tablet   History:   Past Medical History:  Diagnosis Date   (HFpEF) heart failure with preserved ejection fraction ([place])    a.) TTE 10/30/2010: EF 30-35%; sev anterior wall and apical HK. b.) TTE 11/20/2014: EF 50-55%; no RWMAs. c.) TTE 07/06/2019: EF 50-55%; no RWMAs.   Allergic rhinitis    Aortic atherosclerosis (HCC)    Arthritis    Asthma    Atrial fibrillation ([place])    a.) CHA2DS2-VASc = 8 (age x 2, CHF, HTN, TIA x 2, prior MI, T2DM). b.) rate/rhythm maintained with bisoprolol; chronically anticoagulated with apixaban.   Biventricular ICD (implantable cardioverter-defibrillator) in place 2007   a.) Medtronic device placed in 2007 with CRT upgrade in 07/2011. Generator changed 02/2016.   BPH (benign prostatic hyperplasia)    CAD (coronary artery disease) 01/14/1994   a.) cardiac arrest; ROSC achieved. LHC 01/14/1994 showed 95% pLAD stenosis. PCI performed --> 3.5 x 15 mm JJIS to pLAD.  b.) PCI performed in 2007 in [place], [place]; type and location of stent unknown. c.) LHC 2012 --> patent LAD stent; nonobstructive disease.   Cardiac arrest [facility])    Chronic lower back pain    Current use of long term anticoagulation    a.) apixaban   Dyspnea    PFT 12/19/10: FEV1 2.93(107%), FEV1% 72, TLC 8.02(138%), DLCO 110%, +BD   ED (erectile dysfunction)    Elevated PSA    Encysted hydrocele    GERD (gastroesophageal reflux disease)    High-grade heart block    a.) BiV ICD in place   History of kidney stones    Hyperlipidemia    Hypertension    Insomnia    Ischemic cardiomyopathy with implantable cardioverter-defibrillator (ICD)    Myocardial infarction ([place])    Sciatic nerve pain    Severe sinus bradycardia    a.) s/p Medtronic BiV ICD placement   Syncope    T2DM (type 2 diabetes mellitus) ([place])    TIA (transient ischemic attack) 10/2010   Past Surgical History:   Procedure Laterality Date   CATARACT EXTRACTION, BILATERAL     COLONOSCOPY WITH PROPOFOL N/A 04/01/2018   Procedure: COLONOSCOPY WITH PROPOFOL;  Surgeon: [name], MD;  Location[facility] ENDOSCOPY;  Service: Endoscopy;  Laterality: N/A;   CORONARY ANGIOPLASTY     LAD stent later additional stent placed 02/2006 branch of LAD   CYSTOSCOPY WITH INSERTION OF UROLIFT N/A 07/15/2020   Procedure: CYSTOSCOPY WITH INSERTION OF UROLIFT;  Surgeon: [name], MD;  Location: [facility] ORS;  Service: Urology;  Laterality: N/A;   CYSTOSCOPY WITH LITHOLAPAXY N/A 07/15/2020   Procedure: CYSTOSCOPY WITH LITHOLAPAXY;  Surgeon: [name], MD;  Location: [facility] ORS;  Service: Urology;  Laterality: N/A;   EP IMPLANTABLE DEVICE N/A 03/18/2016   Procedure:  BiV ICD Generator Changeout;  Surgeon: [name], MD;  Location: [facility];  Service: Cardiovascular;  Laterality: N/A;   ESOPHAGOGASTRODUODENOSCOPY (EGD) WITH PROPOFOL N/A 04/01/2018   Procedure: ESOPHAGOGASTRODUODENOSCOPY (EGD) WITH PROPOFOL;  Surgeon: [name], MD;  Location: [facility] ENDOSCOPY;  Service: Endoscopy;  Laterality: N/A;   Implantation of Medtronic dual-chamber cardioverter N/A 2007   Left Knee Replacement  12/21/2004  PROSTATE BIOPSY     TONSILLECTOMY  12/21/1946   TOTAL KNEE REVISION Left 06/20/2015   Procedure: TOTAL KNEE REVISION;  Surgeon: [name], MD;  Location: [facility] ORS;  Service: Orthopedics;  Laterality: Left;   Family History  Problem Relation Age of Onset   Emphysema Father    Lung cancer Father    Cirrhosis Mother    Hypertension Brother    Hypertension Son    Heart attack Neg Hx    Stroke Neg Hx    Social History   Tobacco Use   Smoking status: Never   Smokeless tobacco: Never  Vaping Use   Vaping Use: Never used  Substance Use Topics   Alcohol use: Yes    Alcohol/week: 7.0 standard drinks    Types: 7 Shots of liquor per week    Comment: 1 GLASS LIQUOR PER DAY   Drug use: No     Pertinent Clinical Results:  LABS: Labs reviewed: Acceptable for surgery.   Ref Range & Units 07/07/2021  WBC (White Blood Cell Count) 4.1 - 10.2 10^3/uL 4.9   RBC (Red Blood Cell Count) 4.69 - 6.13 10^6/uL 4.09 Low    Hemoglobin 14.1 - 18.1 gm/dL 11.2 Low    Hematocrit 40.0 - 52.0 % 35.8 Low    MCV (Mean Corpuscular Volume) 80.0 - 100.0 fl 87.5   MCH (Mean Corpuscular Hemoglobin) 27.0 - 31.2 pg 27.4   MCHC (Mean Corpuscular Hemoglobin Concentration) 32.0 - 36.0 gm/dL 31.3 Low    Platelet Count 150 - 450 10^3/uL 152   RDW-CV (Red Cell Distribution Width) 11.6 - 14.8 % 14.2   MPV (Mean Platelet Volume) 9.4 - 12.4 fl 12.3   Neutrophils 1.50 - 7.80 10^3/uL 3.40   Lymphocytes 1.00 - 3.60 10^3/uL 1.00   Mixed Count 0.10 - 0.90 10^3/uL 0.50   Neutrophil % 32.0 - 70.0 % 69.7   Lymphocyte % 10.0 - 50.0 % 19.6   Mixed % 3.0 - 14.4 % 10.7   Resulting Agency  [facility] - LAB  Specimen Collected: 07/07/21 08:32 Last Resulted: 07/07/21 10:48  Received From: [facility]  Result Received: 08/07/21 12:23    Ref Range & Units 07/07/2021  Glucose 70 - 110 mg/dL 108   Sodium 136 - 145 mmol/L 142   Potassium 3.6 - 5.1 mmol/L 4.7   Chloride 97 - 109 mmol/L 108   Carbon Dioxide (CO2) 22.0 - 32.0 mmol/L 24.6   Urea Nitrogen (BUN) 7 - 25 mg/dL 16   Creatinine 0.7 - 1.3 mg/dL 0.8   Glomerular Filtration Rate (eGFR), MDRD Estimate >60 mL/min/1.73sq m 93   Calcium 8.7 - 10.3 mg/dL 9.7   AST  8 - 39 U/L 26   ALT  6 - 57 U/L 22   Alk Phos (alkaline Phosphatase) 34 - 104 U/L 56   Albumin 3.5 - 4.8 g/dL 3.9   Bilirubin, Total 0.3 - 1.2 mg/dL 0.8   Protein, Total 6.1 - 7.9 g/dL 6.1   A/G Ratio 1.0 - 5.0 gm/dL 1.8   Resulting Agency  [facility] - LAB  Specimen Collected: 07/07/21 08:32 Last Resulted: 07/07/21 15:37  Received From: [facility]  Result Received: 08/07/21 12:23    Ref Range & Units 07/07/2021  Hemoglobin A1C 4.2 - 5.6 % 7.4  High    Average Blood Glucose (Calc) mg/dL 166   Resulting Agency  [facility] - LAB    ECG: Date: 10/16/2021 Time ECG obtained: 0916 AM  Rate: 61 bpm Rhythm:  AV dual paced rhythm Axis (leads I and aVF): Normal Intervals: PR 168 ms. QRS 144 ms. QTc 467 ms. ST segment and T wave changes: No evidence of acute ST segment elevation or depression Comparison: Similar to previous tracing obtained on 04/01/2021   IMAGING / PROCEDURES: DIAGNOSTIC RADIOGRAPHS OF ABDOMEN 1 VIEW performed on 09/19/2021 Air and stool-filled nondilated loops of bowel. Moderate colonic stool burden diffusely throughout the colon.  Punctate radiopaque focus projecting over the region of the RIGHT kidney likely reflects known nephrolithiasis; evaluation is limited secondary to overlying bowel contents.  Degenerative changes of the lumbar spine.  Surgical clips in the pelvis.  Multiple pelvic phleboliths.  Vascular calcifications.  AICD present  MYOCARDIAL PERFUSION IMAGING STUDY (LEXISCAN) performed on 09/24/2020 Mildly decreased LVEF of 46% Small in size, mild in severity, basal anterolateral defect that appears to be more severe on the rest images.  This most likely represents artifact, however element of scar cannot be excluded. No evidence of significant ischemia Attenuation correction CT demonstrates coronary stents and coronary artery calcification, as well as aortic atherosclerosis and pacemaker wires. Sensitivity specificity of the study degraded by artifact Abnormal, probably low risk pharmacological myocardial perfusion stress test  TRANSTHORACIC ECHOCARDIOGRAM performed on 07/06/2019 LVEF 50-55% Normal left ventricular systolic function Normal right ventricular systolic function Unable to estimate RVSP Mild to moderate left atrial dilatation Mild right atrial dilatation No IAS Trivial tricuspid and pulmonary valve regurgitation No mitral or aortic valve regurgitation No evidence of  valvular stenosis   Impression and Plan:  [name]. has been referred for pre-anesthesia review and clearance prior to him undergoing the planned anesthetic and procedural courses. Available labs, pertinent testing, and imaging results were personally reviewed by me. This patient has been appropriately cleared by cardiology with an overall ACCEPTABLE risk of significant perioperative cardiovascular complications. Of note, patient with history of anemia on anticoagulation therapy. Cardiology asked that CBC be repeated prior to his procedure to assess for stability. This was inadvertently not performed in PAT; no ordered by pre-op RN. Order placed for patient to have CBC checked prior to procedure on DOS. Completed perioperative prescription for cardiac device management documentation completed by primary cardiology team and placed on patient's chart for review by the surgical/anesthetic team on the day of his procedure.   Based on clinical review performed today (10/29/21), barring any significant acute changes in the patient's overall condition, it is anticipated that he will be able to proceed with the planned surgical intervention. Any acute changes in clinical condition may necessitate his procedure being postponed and/or cancelled. Patient will meet with anesthesia team (MD and/or CRNA) on the day of his procedure for preoperative evaluation/assessment. Questions regarding anesthetic course will be fielded at that time.   Pre-surgical instructions were reviewed with the patient during his PAT appointment and questions were fielded by PAT clinical staff. Patient was advised that if any questions or concerns arise prior to his procedure then he should return a call to PAT and/or his surgeon's office to discuss.  Honor Loh, MSN, APRN, FNP-C, CEN [facility]  Peri-operative Services Nurse Practitioner Phone: [phone] Fax: [phone] 10/29/21 9:04 AM  NOTE:  This note has been prepared using Dragon dictation software. Despite my best ability to proofread, there is always the potential that unintentional transcriptional errors may still occur from this process.

## 2021-11-03 ENCOUNTER — Encounter
Admission: RE | Disposition: A | Discharge status: Home / Self Care | Payer: Self-pay | Source: Home / Self Care | Attending: Urology

## 2021-11-03 ENCOUNTER — Ambulatory Visit: Payer: Medicare Other | Admitting: Urgent Care

## 2021-11-03 ENCOUNTER — Other Ambulatory Visit: Payer: Self-pay

## 2021-11-03 ENCOUNTER — Encounter: Payer: Self-pay | Admitting: Urology

## 2021-11-03 ENCOUNTER — Ambulatory Visit
Admission: RE | Admit: 2021-11-03 | Discharge: 2021-11-03 | Disposition: A | Discharge status: Home / Self Care | Payer: Medicare Other | Attending: Urology | Admitting: Urology

## 2021-11-03 DIAGNOSIS — I5032 Chronic diastolic (congestive) heart failure: Secondary | ICD-10-CM | POA: Diagnosis not present

## 2021-11-03 DIAGNOSIS — Z7901 Long term (current) use of anticoagulants: Secondary | ICD-10-CM | POA: Diagnosis not present

## 2021-11-03 DIAGNOSIS — N138 Other obstructive and reflux uropathy: Secondary | ICD-10-CM

## 2021-11-03 DIAGNOSIS — E119 Type 2 diabetes mellitus without complications: Secondary | ICD-10-CM | POA: Insufficient documentation

## 2021-11-03 DIAGNOSIS — J45909 Unspecified asthma, uncomplicated: Secondary | ICD-10-CM | POA: Insufficient documentation

## 2021-11-03 DIAGNOSIS — N210 Calculus in bladder: Secondary | ICD-10-CM | POA: Insufficient documentation

## 2021-11-03 DIAGNOSIS — I4891 Unspecified atrial fibrillation: Secondary | ICD-10-CM | POA: Diagnosis not present

## 2021-11-03 DIAGNOSIS — Z95810 Presence of automatic (implantable) cardiac defibrillator: Secondary | ICD-10-CM | POA: Diagnosis not present

## 2021-11-03 DIAGNOSIS — I110 Hypertensive heart disease with heart failure: Secondary | ICD-10-CM | POA: Insufficient documentation

## 2021-11-03 DIAGNOSIS — N401 Enlarged prostate with lower urinary tract symptoms: Secondary | ICD-10-CM | POA: Insufficient documentation

## 2021-11-03 HISTORY — DX: Unspecified diastolic (congestive) heart failure: I50.30

## 2021-11-03 HISTORY — DX: Anemia, unspecified: D64.9

## 2021-11-03 HISTORY — DX: Atherosclerosis of aorta: I70.0

## 2021-11-03 HISTORY — DX: Long term (current) use of anticoagulants: Z79.01

## 2021-11-03 HISTORY — DX: Ischemic cardiomyopathy: I25.5

## 2021-11-03 HISTORY — DX: Insomnia, unspecified: G47.00

## 2021-11-03 HISTORY — DX: Unspecified atrial fibrillation: I48.91

## 2021-11-03 HISTORY — DX: Elevated prostate specific antigen (PSA): R97.20

## 2021-11-03 HISTORY — PX: CYSTOSCOPY WITH LITHOLAPAXY: SHX1425

## 2021-11-03 HISTORY — DX: Encysted hydrocele: N43.0

## 2021-11-03 HISTORY — PX: CYSTOSCOPY WITH INSERTION OF UROLIFT: SHX6678

## 2021-11-03 HISTORY — DX: Type 2 diabetes mellitus without complications: E11.9

## 2021-11-03 HISTORY — DX: Presence of automatic (implantable) cardiac defibrillator: Z95.810

## 2021-11-03 LAB — CBC
HCT: 36.9 % — ABNORMAL LOW (ref 39.0–52.0)
Hemoglobin: 11.6 g/dL — ABNORMAL LOW (ref 13.0–17.0)
MCH: 26 pg (ref 26.0–34.0)
MCHC: 31.4 g/dL (ref 30.0–36.0)
MCV: 82.7 fL (ref 80.0–100.0)
Platelets: 176 10*3/uL (ref 150–400)
RBC: 4.46 MIL/uL (ref 4.22–5.81)
RDW: 16 % — ABNORMAL HIGH (ref 11.5–15.5)
WBC: 5.7 10*3/uL (ref 4.0–10.5)
nRBC: 0 % (ref 0.0–0.2)

## 2021-11-03 LAB — GLUCOSE, CAPILLARY
Glucose-Capillary: 111 mg/dL — ABNORMAL HIGH (ref 70–99)
Glucose-Capillary: 106 mg/dL — ABNORMAL HIGH (ref 70–99)

## 2021-11-03 SURGERY — CYSTOSCOPY WITH INSERTION OF UROLIFT
Anesthesia: General | Site: Prostate

## 2021-11-03 MED ORDER — ACETAMINOPHEN 10 MG/ML IV SOLN
INTRAVENOUS | Status: DC | PRN
  Administered 2021-11-03: 1000 mg via INTRAVENOUS

## 2021-11-03 MED ORDER — CHLORHEXIDINE GLUCONATE 0.12 % MT SOLN
OROMUCOSAL | Status: AC
  Administered 2021-11-03: 15 mL via OROMUCOSAL
  Filled 2021-11-03: qty 15

## 2021-11-03 MED ORDER — PHENYLEPHRINE HCL (PRESSORS) 10 MG/ML IV SOLN
INTRAVENOUS | Status: AC
  Filled 2021-11-03: qty 1

## 2021-11-03 MED ORDER — CEFAZOLIN SODIUM-DEXTROSE 2-4 GM/100ML-% IV SOLN
2.0000 g | INTRAVENOUS | Status: AC
  Administered 2021-11-03: 2 g via INTRAVENOUS

## 2021-11-03 MED ORDER — ONDANSETRON HCL 4 MG/2ML IJ SOLN: 4.0000 mg | Freq: Once | INTRAMUSCULAR | Status: DC | PRN

## 2021-11-03 MED ORDER — ONDANSETRON HCL 4 MG/2ML IJ SOLN
INTRAMUSCULAR | Status: DC | PRN
  Administered 2021-11-03: 4 mg via INTRAVENOUS

## 2021-11-03 MED ORDER — CHLORHEXIDINE GLUCONATE 0.12 % MT SOLN: 15.0000 mL | Freq: Once | OROMUCOSAL | Status: AC

## 2021-11-03 MED ORDER — MEPERIDINE HCL 25 MG/ML IJ SOLN: 6.2500 mg | INTRAMUSCULAR | Status: DC | PRN

## 2021-11-03 MED ORDER — SODIUM CHLORIDE 0.9 % IV SOLN: INTRAVENOUS | Status: DC

## 2021-11-03 MED ORDER — LACTATED RINGERS IV SOLN: INTRAVENOUS | Status: DC | PRN

## 2021-11-03 MED ORDER — FENTANYL CITRATE (PF) 100 MCG/2ML IJ SOLN
INTRAMUSCULAR | Status: DC | PRN
  Administered 2021-11-03: 25 ug via INTRAVENOUS

## 2021-11-03 MED ORDER — ORAL CARE MOUTH RINSE: 15.0000 mL | Freq: Once | OROMUCOSAL | Status: AC

## 2021-11-03 MED ORDER — PROPOFOL 10 MG/ML IV BOLUS
INTRAVENOUS | Status: DC | PRN
  Administered 2021-11-03: 40 mg via INTRAVENOUS
  Administered 2021-11-03: 120 mg via INTRAVENOUS

## 2021-11-03 MED ORDER — HYDROCODONE-ACETAMINOPHEN 5-325 MG PO TABS: 1.0000 | ORAL_TABLET | Freq: Four times a day (QID) | ORAL | 0 refills | Status: DC | PRN

## 2021-11-03 MED ORDER — PHENYLEPHRINE HCL (PRESSORS) 10 MG/ML IV SOLN
INTRAVENOUS | Status: DC | PRN
  Administered 2021-11-03 (×3): 100 ug via INTRAVENOUS
  Administered 2021-11-03: 200 ug via INTRAVENOUS
  Administered 2021-11-03 (×3): 100 ug via INTRAVENOUS

## 2021-11-03 MED ORDER — SODIUM CHLORIDE 0.9 % IR SOLN
Status: DC | PRN
  Administered 2021-11-03: 6000 mL

## 2021-11-03 MED ORDER — EPHEDRINE SULFATE 50 MG/ML IJ SOLN
INTRAMUSCULAR | Status: DC | PRN
  Administered 2021-11-03 (×3): 5 mg via INTRAVENOUS

## 2021-11-03 MED ORDER — ONDANSETRON HCL 4 MG/2ML IJ SOLN
INTRAMUSCULAR | Status: AC
  Filled 2021-11-03: qty 2

## 2021-11-03 MED ORDER — EPHEDRINE 5 MG/ML INJ
INTRAVENOUS | Status: AC
  Filled 2021-11-03: qty 5

## 2021-11-03 MED ORDER — CEFAZOLIN SODIUM-DEXTROSE 2-4 GM/100ML-% IV SOLN
INTRAVENOUS | Status: AC
  Filled 2021-11-03: qty 100

## 2021-11-03 MED ORDER — ACETAMINOPHEN 10 MG/ML IV SOLN
INTRAVENOUS | Status: AC
  Filled 2021-11-03: qty 100

## 2021-11-03 MED ORDER — FENTANYL CITRATE (PF) 100 MCG/2ML IJ SOLN: 25.0000 ug | INTRAMUSCULAR | Status: DC | PRN

## 2021-11-03 MED ORDER — LIDOCAINE HCL (CARDIAC) PF 100 MG/5ML IV SOSY
PREFILLED_SYRINGE | INTRAVENOUS | Status: DC | PRN
  Administered 2021-11-03: 80 mg via INTRAVENOUS

## 2021-11-03 MED ORDER — FENTANYL CITRATE (PF) 100 MCG/2ML IJ SOLN
INTRAMUSCULAR | Status: AC
  Filled 2021-11-03: qty 2

## 2021-11-03 MED ORDER — PROPOFOL 10 MG/ML IV BOLUS
INTRAVENOUS | Status: AC
  Filled 2021-11-03: qty 20

## 2021-11-03 SURGICAL SUPPLY — 23 items
BAG DRAIN CYSTO-URO LG1000N (MISCELLANEOUS) ×2 IMPLANT
BASKET ZERO TIP 1.9FR (BASKET) ×1 IMPLANT
BSKT STON RTRVL ZERO TP 1.9FR (BASKET)
CATH URETL OPEN END 4X70 (CATHETERS) ×1 IMPLANT
FIBER LASER MOSES 365 DFL (Laser) ×1 IMPLANT
GAUZE 4X4 16PLY ~~LOC~~+RFID DBL (SPONGE) ×4 IMPLANT
GLOVE SURG ENC MOIS LTX SZ6.5 (GLOVE) ×4 IMPLANT
GOWN STRL REUS W/ TWL LRG LVL3 (GOWN DISPOSABLE) ×2 IMPLANT
GOWN STRL REUS W/TWL LRG LVL3 (GOWN DISPOSABLE) ×4
IV NS IRRIG 3000ML ARTHROMATIC (IV SOLUTION) ×2 IMPLANT
KIT TURNOVER CYSTO (KITS) ×2 IMPLANT
MANIFOLD NEPTUNE II (INSTRUMENTS) ×1 IMPLANT
PACK CYSTO AR (MISCELLANEOUS) ×2 IMPLANT
SET CYSTO W/LG BORE CLAMP LF (SET/KITS/TRAYS/PACK) ×2 IMPLANT
SET IRRIG Y TYPE TUR BLADDER L (SET/KITS/TRAYS/PACK) ×2 IMPLANT
SURGILUBE 2OZ TUBE FLIPTOP (MISCELLANEOUS) ×1 IMPLANT
SYR TOOMEY IRRIG 70ML (MISCELLANEOUS) ×2
SYRINGE TOOMEY IRRIG 70ML (MISCELLANEOUS) ×1 IMPLANT
SYSTEM UROLIFT 2 CART W/ HNDL (Male Continence) ×1 IMPLANT
SYSTEM UROLIFT 2 CARTRIDGE (Male Continence) ×5 IMPLANT
WATER STERILE IRR 1000ML POUR (IV SOLUTION) ×1 IMPLANT
WATER STERILE IRR 3000ML UROMA (IV SOLUTION) ×4 IMPLANT
WATER STERILE IRR 500ML POUR (IV SOLUTION) ×2 IMPLANT

## 2021-11-03 NOTE — Anesthesia Postprocedure Evaluation (Signed)
Anesthesia Post Note  Patient: [name].  Procedure(s) Performed: CYSTOSCOPY WITH INSERTION OF UROLIFT (Prostate) CYSTOSCOPY WITH LITHOLAPAXY (Prostate)  Patient location during evaluation: PACU Anesthesia Type: General Level of consciousness: awake and alert, awake and oriented Pain management: pain level controlled Vital Signs Assessment: post-procedure vital signs reviewed and stable Respiratory status: spontaneous breathing, nonlabored ventilation and respiratory function stable Cardiovascular status: blood pressure returned to baseline and stable Postop Assessment: no apparent nausea or vomiting Anesthetic complications: no   No notable events documented.   Last Vitals:  Vitals:   11/03/21 0900 11/03/21 0909  BP: 135/90 (!) 142/80  Pulse: 60 62  Resp: 16 16  Temp: 36.6 C (!) 35.9 C  SpO2: 99% 99%    Last Pain:  Vitals:   11/03/21 0909  TempSrc: Temporal  PainSc: 0-No pain                 Phill Mutter

## 2021-11-03 NOTE — H&P (Signed)
Updated 11/03/21  RRR CTAB    CC:     Chief Complaint  Patient presents with   Benign Prostatic Hypertrophy      HPI: [name]. is a 80 y.o. male with a personal history of elevated PSA, BPH with LUTS, bladder stones, and high risk hematuria, who presents today for a cystoscopy.    He is s/p prostate biopsies x3 with Dr. Elenor Quinones and Dr. Jacqlyn Larsen. His most recent PSA as of 05/2018 was 3.12.    Cystoscopy in 2021 revealed enlarged prostate w/ bilobar coaptation and saccules in diverticula and 8 or so stones not more than 8 mm, as well as chronic obstruction.    He is s/p UroLift on 06/2020 and is managed on dutasteride.   CTU in 2021 revealed numerous bladder stones measuring up to 8 mm in size. He is s/p cystolitholapaxy on 06/2020.    He is doing well today with no new urinary symptoms. He reports that the UroLift helped with his flow.         Vitals:    10/01/21 1339  BP: 121/77  Pulse: 73    NED. A&Ox3.   No respiratory distress   Abd soft, NT, ND Normal phallus with bilateral descended testicles   Cystoscopy Procedure Note   Patient identification was confirmed, informed consent was obtained, and patient was prepped using Betadine solution.  Lidocaine jelly was administered per urethral meatus.       Pre-Procedure: - Inspection reveals a normal caliber ureteral meatus.   Procedure: The flexible cystoscope was introduced without difficulty - No urethral strictures/lesions are present. Enlarged prostate . Nice anterior channel more coaptation near gland  - bilobar coaptation closer to bladder neck; mildly elevated bladder neck - Bilateral ureteral orifices identified - Bladder mucosa  reveals no ulcers, tumors, or lesions - 3 free floating bladder stones, no evidence of device erosion at bladder neck  - mild  trabeculation with saccule    Retroflexion shows 3 free floating bladder stones, no evidence of device erosion at bladder neck       Post-Procedure: - Patient tolerated the procedure well   Assessment/ Plan: Recurrent bladder stones  - Cystoscopy revealed bladder stones    - Given his reoccurrence it is likely due to outlet obstruction    3. BPH w/outlet obstruction    - Discussed treatments of HoLEP, Urolift, and TURP  With concomitant cystolitholapaxy, and optimization of medicine.   - Each surgery was reviewed in detail today including preoperative, intraoperative, and postoperative course.    - Discussed risk of bleeding, infection, damage surrounding structures, injury to the bladder/ urethral, bladder neck contracture, ureteral stricture, retrograde ejaculation, stress/ urge incontinence, exacerbation of irritative voiding symptoms were all discussed in detail.     - He has elected to proceed with redo urolift with cystolitholapaxy . iIf this fails he would be more open to HoLEP .  Risk and benefits including risk of bleeding, infection, damage surrounding structures, recurrent bladder stones amongst others were all discussed.   -We will also plan to increase his dutasteride to daily   Follow-up with UroLift and cystolitholapaxy    [name], MD

## 2021-11-03 NOTE — Discharge Instructions (Addendum)
Urolift Post-Operative Instructions     Patient Expectations   1. Mild blood in your urine for about 1 week.  2. Urinary buring, frequency, and urgency for 10 days.  3. Mild pelvic pain 1-2 weeks.     Return to Activity     1. Drink water post procedure.  2. Take meds as needed.  Tylenol and/or Motrin is most helpful.  You may also by Pyridium/Azo over-the-counter for urinary burning.  3. No lifting or straining 48hrs.  4. Other activity when they feel up to it.  5.  You may resume Eliquis tomorrow if your urine is clearing    DISCHARGE INSTRUCTIONS   The drugs that you were given will stay in your system until tomorrow so for the next 24 hours you should not:  Drive an automobile Make any legal decisions Drink any alcoholic beverage   You may resume regular meals tomorrow.  Today it is better to start with liquids and gradually work up to solid foods.  You may eat anything you prefer, but it is better to start with liquids, then soup and crackers, and gradually work up to solid foods.   Please notify your doctor immediately if you have any unusual bleeding, trouble breathing, redness and pain at the surgery site, drainage, fever, or pain not relieved by medication.    Additional Instructions:        Please contact your physician with any problems or Same Day Surgery at [phone], Monday through Friday 6 am to 4 pm, or [place] [facility] number at [phone].

## 2021-11-03 NOTE — Transfer of Care (Signed)
Immediate Anesthesia Transfer of Care Note  Patient: [name].  Procedure(s) Performed: CYSTOSCOPY WITH INSERTION OF UROLIFT (Prostate) CYSTOSCOPY WITH LITHOLAPAXY (Prostate)  Patient Location: PACU  Anesthesia Type:General  Level of Consciousness: awake, alert  and oriented  Airway & Oxygen Therapy: Patient Spontanous Breathing and Patient connected to face mask oxygen  Post-op Assessment: Report given to RN and Post -op Vital signs reviewed and stable  Post vital signs: stable  Last Vitals:  Vitals Value Taken Time  BP 132/69 11/03/21 0838  Temp    Pulse 59 11/03/21 0844  Resp 12 11/03/21 0844  SpO2 99 % 11/03/21 0844  Vitals shown include unvalidated device data.  Last Pain:  Vitals:   11/03/21 0630  TempSrc: Temporal  PainSc: 0-No pain      Patients Stated Pain Goal: 0 (48/34/75 8307)  Complications: No notable events documented.

## 2021-11-03 NOTE — Op Note (Signed)
Preoperative diagnosis: BPH with obstructive symptomatology, recurrent bladder stones   Postoperative diagnosis: same   Principal procedure: Urolift procedure, with the placement of 6 implants, cystolitholapaxy   Surgeon: [name]   Anesthesia: General with LMA   Complications: None  Findings: Obstructive appearing prostate.  Small false pass at right prostatic apex from initial scope placement.  Obstructive appearing prostate with obstruction, trabeculated bladder with 4-5 bladder stones, up to 1 cm each.  All stones obliterated with laser and irrigated.   Drains: None   Estimated blood loss: < 5 mL   Indications: 80 year-old male with obstructive symptomatology secondary to BPH.  The patient's symptoms have progressed, and he has requested further management.  Management options including TURP with resection/ablation of the prostate as well as Urolift were discussed.  The patient has chosen to have a Urolift procedure.  He has been instructed to the procedure as well as risks and complications which include but are not limited to infection, bleeding, and inadequate treatment with the Urolift procedure alone, anesthetic complications, among others.  He understands these and desires to proceed.     Description of procedure: The patient was properly identified in the holding area.  He received preoperative IV antibiotics.  He was taken to the operating room general anesthesia was induced with LMA.  He is placed in the dorsolithotomy position.  Genitalia and perineum were prepped and draped.  Proper timeout was performed.  A 26 French resectoscope using an angled blunt obturator was advanced.  Was unable to advance that initially beyond the prostatic apex and using visual obturator was able to guide it into the bladder.  Notably, the bladder neck was elevated and there was significant bilobar coaptation of a fairly vascular and enlarged prostate.  There was a small false pass noted at the  right prostatic apex presumably from attempted scope placement initially.  The scope was then advanced into the bladder.  The bladder was moderately trabeculated.  Bilateral ureteral orifice ease were identified with a flexible yellow urine from each.  4-5 bladder stones were encountered, each about 1 cm which appeared to be too large to irrigate.  I used a 353 m laser fiber and using initially settings of 0.8 J and 10 Hz and later 1 J and 15 Hz, the stones were fragmented into tiny dust like material.  This was irrigated free from the bladder.   A 49F cystoscope was inserted into the bladder with findings as described above.  The 1st pair of implants were placed at the bladder neck ~1.5 cm from the bladder Neck. The 2nd pair of implants were placed at the level of the verumontanum.   A repeat cysto was performed and another pair of implants in between the prior pairs.    A final cystoscopy was conducted first to inspect the location and state of each implant and second, to confirm the presence of a continuous anterior channel was present through the prostatic urethra with irrigation flow turned off.    6 implants were delivered in total.    Following this, the scope was removed.  After anesthetic reversal he was transported to the PACU in stable condition.  He tolerated the procedure well.   Plan: He will follow-up with me in 4 to 6 weeks for IPSS/PVR.

## 2021-11-03 NOTE — Anesthesia Preprocedure Evaluation (Signed)
Anesthesia Evaluation  Patient identified by MRN, date of birth, ID band Patient awake    Reviewed: Allergy & Precautions, H&P , NPO status , Patient's Chart, lab work & pertinent test results  History of Anesthesia Complications Negative for: history of anesthetic complications  Airway Mallampati: III  TM Distance: <3 FB Neck ROM: limited    Dental  (+) Chipped, Poor Dentition   Pulmonary shortness of breath and with exertion, asthma ,    Pulmonary exam normal        Cardiovascular Exercise Tolerance: Good hypertension, Pt. on medications and Pt. on home beta blockers (-) angina+ CAD, + Past MI and + Cardiac Stents  (-) DOE Normal cardiovascular exam+ dysrhythmias + Cardiac Defibrillator      Neuro/Psych TIA Neuromuscular disease negative psych ROS   GI/Hepatic Neg liver ROS, GERD  Medicated and Controlled,  Endo/Other  diabetes, Type 2  Renal/GU Renal disease     Musculoskeletal  (+) Arthritis , Osteoarthritis,    Abdominal   Peds  Hematology negative hematology ROS (+) anemia ,   Anesthesia Other Findings Past Medical History: No date: AICD (automatic cardioverter/defibrillator) present No date: Allergic rhinitis No date: Arthritis No date: Asthma No date: Biventricular defibrillator-Medtronic     Comment:  Generator replacement 2012 with CRT upgrade No date: BPH (benign prostatic hyperplasia) No date: Cardiac arrest ([place]) No date: Chronic lower back pain No date: Diabetes mellitus without complication ([place])     Comment:  BORDER LINE No date: Dyspnea     Comment:  PFT 12/19/10: FEV1 2.93(107%), FEV1% 72, TLC 8.02(138%),              DLCO 110%, +BD No date: ED (erectile dysfunction) No date: GERD (gastroesophageal reflux disease) No date: GERD (gastroesophageal reflux disease) No date: High-grade heart block No date: History of kidney stones No date: Hyperlipidemia No date: Hypertension No date:  ischemic cardiomyopathy     Comment:  stent to a 95%-occluded LAD in 1995; catheterization in               2012 demonstrated patent grafts and nonobstructive               disease No date: Myocardial infarction ([place]) No date: Sciatic nerve pain No date: Severe sinus bradycardia No date: Syncope     Comment:    nov. 2011: TIA (transient ischemic attack)  Past Surgical History: No date: CATARACT EXTRACTION, BILATERAL 04/01/2018: COLONOSCOPY WITH PROPOFOL; N/A     Comment:  Procedure: COLONOSCOPY WITH PROPOFOL;  Surgeon: [name], MD;  Location: [facility] ENDOSCOPY;  Service:               Endoscopy;  Laterality: N/A; No date: CORONARY ANGIOPLASTY 03/18/2016: EP IMPLANTABLE DEVICE; N/A     Comment:  Procedure:  BiV ICD Generator Changeout;  Surgeon:               [name], MD;  Location: [facility];                Service: Cardiovascular;  Laterality: N/A; 04/01/2018: ESOPHAGOGASTRODUODENOSCOPY (EGD) WITH PROPOFOL; N/A     Comment:  Procedure: ESOPHAGOGASTRODUODENOSCOPY (EGD) WITH               PROPOFOL;  Surgeon: [name], MD;  Location:               [facility] ENDOSCOPY;  Service: Endoscopy;  Laterality: N/A; No date: EYE SURGERY; Bilateral     Comment:  cataract extraction No date: Implantation of Medtronic dual-chamber cardioverter No date: JOINT REPLACEMENT; Left     Comment:  total knee replacement 2006: Left Knee Replacement No date: PROSTATE BIOPSY 1948: TONSILLECTOMY 06/20/2015: TOTAL KNEE REVISION; Left     Comment:  Procedure: TOTAL KNEE REVISION;  Surgeon: [name],               MD;  Location: [facility] ORS;  Service: Orthopedics;                Laterality: Left;  left ventricle has low normal systolic function, with an ejection  fraction of 50-55%.     Reproductive/Obstetrics negative OB ROS                             Anesthesia Physical  Anesthesia Plan  ASA: 4  Anesthesia Plan: General    Post-op Pain Management:    Induction: Intravenous  PONV Risk Score and Plan: Dexamethasone, Ondansetron, Midazolam and Treatment may vary due to age or medical condition  Airway Management Planned: LMA and Oral ETT  Additional Equipment:   Intra-op Plan:   Post-operative Plan: Extubation in OR  Informed Consent: I have reviewed the patients History and Physical, chart, labs and discussed the procedure including the risks, benefits and alternatives for the proposed anesthesia with the patient or authorized representative who has indicated his/her understanding and acceptance.     Dental Advisory Given  Plan Discussed with: Anesthesiologist, CRNA and Surgeon  Anesthesia Plan Comments: (Patient consented for risks of anesthesia including but not limited to:  - adverse reactions to medications - damage to eyes, teeth, lips or other oral mucosa - nerve damage due to positioning  - sore throat or hoarseness - Damage to heart, brain, nerves, lungs, other parts of body or loss of life  Patient voiced understanding.)        Anesthesia Quick Evaluation

## 2021-11-03 NOTE — Anesthesia Procedure Notes (Signed)
Procedure Name: LMA Insertion Date/Time: 11/03/2021 7:51 AM Performed by: Natasha Mead, CRNA Pre-anesthesia Checklist: Patient identified, Emergency Drugs available, Suction available, Patient being monitored and Timeout performed Patient Re-evaluated:Patient Re-evaluated prior to induction Oxygen Delivery Method: Circle system utilized Preoxygenation: Pre-oxygenation with 100% oxygen Induction Type: IV induction Ventilation: Mask ventilation without difficulty LMA: LMA inserted LMA Size: 5.0 Number of attempts: 1 Tube secured with: Tape Dental Injury: Teeth and Oropharynx as per pre-operative assessment

## 2021-11-05 NOTE — Progress Notes (Signed)
No ICM remote transmission received for 11/03/2021 and next ICM transmission scheduled for 12/01/2021.

## 2021-11-06 ENCOUNTER — Encounter: Payer: Self-pay | Admitting: Urology

## 2021-11-25 ENCOUNTER — Encounter: Payer: Medicare Other | Admitting: Internal Medicine

## 2021-12-04 ENCOUNTER — Telehealth: Payer: Self-pay

## 2021-12-04 NOTE — Telephone Encounter (Signed)
I called the patient to get him to send a manual transmission with his home monitor. The patient states he has a cold with a low grade fever. I called tech support to help [place] with the monitor. Medtronic is sending the patient a new monitor. He should receive the new monitor in 4-7 business days.

## 2021-12-07 NOTE — Progress Notes (Incomplete)
12/07/21 8:50 PM   Ronnie April Jr. 10/10/1941 [id number]  Referring provider:  Derinda Late, MD [phone] [facility] [place],  [place] [address] No chief complaint on file.    HPI: [name]. is a 80 y.o.male with a personal history of elevated PSA, BPH with LUTS, bladder stones, and high risk hematuria, who presents today for 6 week post-op follow-up with [facility].   He is s/p prostate biopsies x3 with Dr. Elenor Quinones and Dr. Jacqlyn Larsen. His most recent PSA as of 05/2018 was 3.12.    He is s/p UroLift on 06/2020 and is managed on dutasteride.   CTU in 2021 revealed numerous bladder stones measuring up to 8 mm in size. He is s/p cystolitholapaxy on 06/2020.   He is s/p Urolift on 11/03/2021. Intraoperative findings showed obstructive appearing prostate.  Small false pass at right prostatic apex from initial scope placement.  Obstructive appearing prostate with obstruction, trabeculated bladder with 4-5 bladder stones, up to 1 cm each.  All stones obliterated with laser and irrigated.  PMH: Past Medical History:  Diagnosis Date   (HFpEF) heart failure with preserved ejection fraction ([place])    a.) TTE 10/30/2010: EF 30-35%; sev anterior wall and apical HK. b.) TTE 11/20/2014: EF 50-55%; no RWMAs. c.) TTE 07/06/2019: EF 50-55%; no RWMAs.   Allergic rhinitis    Anemia    Aortic atherosclerosis (HCC)    Arthritis    Asthma    Atrial fibrillation ([place])    a.) CHA2DS2-VASc = 8 (age x 2, CHF, HTN, TIA x 2, prior MI, T2DM). b.) rate/rhythm maintained with bisoprolol; chronically anticoagulated with apixaban.   Biventricular ICD (implantable cardioverter-defibrillator) in place 2007   a.) Medtronic device placed in 2007 with CRT upgrade in 07/2011. Generator changed 02/2016.   BPH (benign prostatic hyperplasia)    CAD (coronary artery disease) 01/14/1994   a.) cardiac arrest; ROSC achieved. LHC 01/14/1994 showed 95% pLAD stenosis. PCI  performed --> 3.5 x 15 mm JJIS to pLAD.  b.) PCI performed in 2007 in [place], [place]; type and location of stent unknown. c.) LHC 2012 --> patent LAD stent; nonobstructive disease.   Cardiac arrest [facility])    Chronic lower back pain    Current use of long term anticoagulation    a.) apixaban   Dyspnea    PFT 12/19/10: FEV1 2.93(107%), FEV1% 72, TLC 8.02(138%), DLCO 110%, +BD   ED (erectile dysfunction)    Elevated PSA    Encysted hydrocele    GERD (gastroesophageal reflux disease)    High-grade heart block    a.) BiV ICD in place   History of kidney stones    Hyperlipidemia    Hypertension    Insomnia    Ischemic cardiomyopathy with implantable cardioverter-defibrillator (ICD)    Myocardial infarction ([place])    Sciatic nerve pain    Severe sinus bradycardia    a.) s/p Medtronic BiV ICD placement   Syncope    T2DM (type 2 diabetes mellitus) ([place])    TIA (transient ischemic attack) 10/2010    Surgical History: Past Surgical History:  Procedure Laterality Date   CATARACT EXTRACTION, BILATERAL     COLONOSCOPY WITH PROPOFOL N/A 04/01/2018   Procedure: COLONOSCOPY WITH PROPOFOL;  Surgeon: [name], MD;  Location[facility] END[facility]COPY;  Service: Endoscopy;  Laterality: N/A;   CORONARY ANGIOPLASTY     LAD stent later additional stent placed 02/2006 branch of LAD   CYSTOSCOPY WITH INSERTION  OF UROLIFT N/A 07/15/2020   Procedure: CYSTOSCOPY WITH INSERTION OF UROLIFT;  Surgeon: [name], MD;  Location: [facility] ORS;  Service: Urology;  Laterality: N/A;   CYSTOSCOPY WITH INSERTION OF UROLIFT N/A 11/03/2021   Procedure: CYSTOSCOPY WITH INSERTION OF UROLIFT;  Surgeon: [name], MD;  Location: [facility] ORS;  Service: Urology;  Laterality: N/A;   CYSTOSCOPY WITH LITHOLAPAXY N/A 07/15/2020   Procedure: CYSTOSCOPY WITH LITHOLAPAXY;  Surgeon: [name], MD;  Location: [facility] ORS;  Service: Urology;  Laterality: N/A;   CYSTOSCOPY WITH LITHOLAPAXY N/A 11/03/2021   Procedure: CYSTOSCOPY  WITH LITHOLAPAXY;  Surgeon: [name], MD;  Location: [facility] ORS;  Service: Urology;  Laterality: N/A;   EP IMPLANTABLE DEVICE N/A 03/18/2016   Procedure:  BiV ICD Generator Changeout;  Surgeon: [name], MD;  Location: [facility];  Service: Cardiovascular;  Laterality: N/A;   ESOPHAGOGASTRODUODENOSCOPY (EGD) WITH PROPOFOL N/A 04/01/2018   Procedure: ESOPHAGOGASTRODUODENOSCOPY (EGD) WITH PROPOFOL;  Surgeon: [name], MD;  Location: [facility] ENDOSCOPY;  Service: Endoscopy;  Laterality: N/A;   Implantation of Medtronic dual-chamber cardioverter N/A 2007   Left Knee Replacement  12/21/2004   PROSTATE BIOPSY     TONSILLECTOMY  12/21/1946   TOTAL KNEE REVISION Left 06/20/2015   Procedure: TOTAL KNEE REVISION;  Surgeon: [name], MD;  Location: [facility] ORS;  Service: Orthopedics;  Laterality: Left;    Home Medications:  Allergies as of 12/09/2021       Reactions   Beta Adrenergic Blockers Other (See Comments)   Fatigue   Oxycodone Nausea And Vomiting   Sulfonamide Derivatives Nausea And Vomiting        Medication List        Accurate as of December 07, 2021  8:50 PM. If you have any questions, ask your nurse or doctor.          acetaminophen 500 MG tablet Commonly known as: TYLENOL Take 500 mg by mouth 2 (two) times daily.   albuterol 108 (90 Base) MCG/ACT inhaler Commonly known as: VENTOLIN HFA Inhale 1-2 puffs into the lungs every 6 (six) hours as needed for wheezing or shortness of breath.   bisoprolol 5 MG tablet Commonly known as: ZEBETA Take 1 tablet (5 mg total) by mouth daily.   CITRACAL + D PO Take 2 tablets by mouth daily.   dorzolamide 2 % ophthalmic solution Commonly known as: TRUSOPT Place 1 drop into the right eye 2 (two) times daily.   dutasteride 0.5 MG capsule Commonly known as: AVODART Take 1 capsule (0.5 mg total) by mouth every 3 (three) days. What changed: when to take this   Eliquis 5 MG Tabs tablet Generic drug:  apixaban TAKE ONE TABLET TWICE DAILY   fluconazole 200 MG tablet Commonly known as: DIFLUCAN Take 200 mg by mouth once a week.   furosemide 40 MG tablet Commonly known as: LASIX TAKE 1 TABLET BY MOUTH EVERY OTHER DAY AS DIRECTED What changed:  how much to take how to take this when to take this additional instructions   glipiZIDE 5 MG 24 hr tablet Commonly known as: GLUCOTROL XL Take 5 mg by mouth daily.   HYDROcodone-acetaminophen 5-325 MG tablet Commonly known as: NORCO/VICODIN Take 1-2 tablets by mouth every 6 (six) hours as needed for moderate pain.   hydroxypropyl methylcellulose / hypromellose 2.5 % ophthalmic solution Commonly known as: ISOPTO TEARS / GONIOVISC Place 1 drop into both eyes 3 (three) times daily as needed for dry eyes.   ipratropium 0.06 %  nasal spray Commonly known as: ATROVENT Place 1 spray into both nostrils daily as needed for rhinitis.   latanoprost 0.005 % ophthalmic solution Commonly known as: XALATAN Place 1 drop into the right eye at bedtime.   losartan 50 MG tablet Commonly known as: COZAAR Take 50 mg by mouth daily.   Melatonin 10 MG Tabs Take 10 mg by mouth See admin instructions. Take 10 mg by mouth every other night   metFORMIN 500 MG 24 hr tablet Commonly known as: GLUCOPHAGE-XR Take 500 mg by mouth in the morning and at bedtime.   montelukast 10 MG tablet Commonly known as: SINGULAIR Take 10 mg by mouth daily.   multivitamin tablet Take 2 tablets by mouth daily.   omeprazole 40 MG capsule Commonly known as: PRILOSEC Take 40 mg by mouth every morning.   rosuvastatin 40 MG tablet Commonly known as: CRESTOR Take 1 tablet (40 mg total) by mouth daily.   SINEX REGULAR NA Place 2 sprays into both nostrils daily as needed (congestion).   sulfamethoxazole-trimethoprim 800-160 MG tablet Commonly known as: BACTRIM DS Take 1 tablet by mouth every 12 (twelve) hours.   zolpidem 5 MG tablet Commonly known as: AMBIEN Take 5  mg by mouth See admin instructions. Take 5 mg by mouth every other night        Allergies:  Allergies  Allergen Reactions   Beta Adrenergic Blockers Other (See Comments)    Fatigue   Oxycodone Nausea And Vomiting   Sulfonamide Derivatives Nausea And Vomiting    Family History: Family History  Problem Relation Age of Onset   Emphysema Father    Lung cancer Father    Cirrhosis Mother    Hypertension Brother    Hypertension Son    Heart attack Neg Hx    Stroke Neg Hx     Social History:  reports that he has never smoked. He has never used smokeless tobacco. He reports current alcohol use of about 7.0 standard drinks per week. He reports that he does not use drugs.   Physical Exam: There were no vitals taken for this visit.  Constitutional:  Alert and oriented, No acute distress. HEENT: [place] AT, moist mucus membranes.  Trachea midline, no masses. Cardiovascular: No clubbing, cyanosis, or edema. Respiratory: Normal respiratory effort, no increased work of breathing. Skin: No rashes, bruises or suspicious lesions. Neurologic: Grossly intact, no focal deficits, moving all 4 extremities. Psychiatric: Normal mood and affect.  Laboratory Data:  Lab Results  Component Value Date   CREATININE 0.90 09/17/2020    Urinalysis   Pertinent Imaging:    Assessment & Plan:     No follow-ups on file.  I,Kailey Littlejohn,acting as a Education administrator for [name], MD.,have documented all relevant documentation on the behalf of [name], MD,as directed by  [name], MD while in the presence of [name], [place] [address] [place], [place] [address] [phone]

## 2021-12-09 ENCOUNTER — Ambulatory Visit: Payer: Medicare Other | Admitting: Urology

## 2021-12-11 ENCOUNTER — Ambulatory Visit (INDEPENDENT_AMBULATORY_CARE_PROVIDER_SITE_OTHER): Payer: Medicare Other

## 2021-12-11 DIAGNOSIS — Z95810 Presence of automatic (implantable) cardiac defibrillator: Secondary | ICD-10-CM

## 2021-12-11 DIAGNOSIS — I5022 Chronic systolic (congestive) heart failure: Secondary | ICD-10-CM | POA: Diagnosis not present

## 2021-12-11 NOTE — Progress Notes (Signed)
EPIC Encounter for ICM Monitoring  Patient Name: [name]. is a 80 y.o. male Date: 12/11/2021 Primary Care Physican: Derinda Late, MD Primary Cardiologist: [name] Electrophysiologist: Vergie Living Pacing: 100%         12/11/2021 Weight: 180-182 lbs                                                            Spoke with patient and heart failure questions reviewed.  Pt has some intermittent swelling in lower extremities.   Optivol thoracic impedance suggesting normal fluid levels.       Prescribed: Furosemide 40 mg Take 40 mg by mouth every 3 (three) days.   Recommendations:   No changes and encouraged to call if experiencing any fluid symptoms.   Follow-up plan: ICM clinic phone appointment on 01/20/2022.   91 day device clinic remote transmission 12/26/2021.     EP/Cardiology Office Visits:  04/07/2022  with Dr. [name].     Copy of ICM check sent to Dr. [name].     3 month ICM trend: 12/11/2021.    12-14 Month ICM trend:       Rosalene Billings, RN 12/11/2021 3:00 PM

## 2021-12-23 NOTE — Progress Notes (Signed)
12/24/21 2:39 PM   Ronnie April Jr. [birth year] [id number]  Referring provider:  Derinda Late, MD [phone] [facility] [place],  [place] [address] Chief Complaint  Patient presents with   Benign Prostatic Hypertrophy    HPI: Ronnie Brahm. is a 81 y.o.male with a personal history of elevated PSA, BPH with LUTS, bladder stones, and high risk hematuria, who presents today for a 6 week  He is s/p prostate biopsies x3 with Dr. Elenor Quinones and Dr. Jacqlyn Larsen. His most recent PSA as of 05/2018 was 3.12.   He is s/p UroLift on 06/2020 and is managed on dutasteride.  Cystoscopy on 10/01/2021 3 free floating bladder stones, no evidence of device erosion at bladder neck.   He is s/p Urolift procedure on 11/03/2021. Findings showed obstructive appearing prostate.  Small false pass at right prostatic apex from initial scope placement. Obstructive appearing prostate with obstruction, trabeculated bladder with 4-5 bladder stones, up to 1 cm each.  All stones obliterated with laser and irrigated.  He reports that he feels that the surgery has helped he has experienced less urgency and frequency. He has experienced some leakage and he sometimes get split steam at the beginning of his stream.  He also feels like he is emptying better.  Overall, he thinks that undergoing second procedure was helpful.  He has not had any further gross hematuria or dysuria.    IPSS     Row Name 12/24/21 1400         International Prostate Symptom Score   How often have you had the sensation of not emptying your bladder? Not at All     How often have you had to urinate less than every two hours? More than half the time     How often have you found you stopped and started again several times when you urinated? Almost always     How often have you found it difficult to postpone urination? About half the time     How often have you had a weak urinary stream? About  half the time     How often have you had to strain to start urination? More than half the time     How many times did you typically get up at night to urinate? 2 Times     Total IPSS Score 21       Quality of Life due to urinary symptoms   If you were to spend the rest of your life with your urinary condition just the way it is now how would you feel about that? Mostly Satisfied              Score:  1-7 Mild 8-19 Moderate 20-35 Severe  PMH: Past Medical History:  Diagnosis Date   (HFpEF) heart failure with preserved ejection fraction ([place])    a.) TTE 10/30/2010: EF 30-35%; sev anterior wall and apical HK. b.) TTE 11/20/2014: EF 50-55%; no RWMAs. c.) TTE 07/06/2019: EF 50-55%; no RWMAs.   Allergic rhinitis    Anemia    Aortic atherosclerosis (HCC)    Arthritis    Asthma    Atrial fibrillation ([place])    a.) CHA2DS2-VASc = 8 (age x 2, CHF, HTN, TIA x 2, prior MI, T2DM). b.) rate/rhythm maintained with bisoprolol; chronically anticoagulated with apixaban.   Biventricular ICD (implantable cardioverter-defibrillator) in place 2007   a.) Medtronic device placed in 2007 with CRT upgrade in 07/2011. Generator  changed 02/2016.   BPH (benign prostatic hyperplasia)    CAD (coronary artery disease) 01/14/1994   a.) cardiac arrest; ROSC achieved. LHC 01/14/1994 showed 95% pLAD stenosis. PCI performed --> 3.5 x 15 mm JJIS to pLAD.  b.) PCI performed in 2007 in [place], [place]; type and location of stent unknown. c.) LHC 2012 --> patent LAD stent; nonobstructive disease.   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)    Myocardial infarction ([place])    Sciatic nerve pain    Severe sinus bradycardia    a.) s/p Medtronic BiV ICD placement   Syncope    T2DM (type 2 diabetes mellitus) ([place])    TIA (transient ischemic attack) 10/2010    Surgical History: Past Surgical History:  Procedure Laterality Date   CATARACT EXTRACTION, BILATERAL     COLONOSCOPY WITH PROPOFOL N/A 04/01/2018   Procedure: COLONOSCOPY WITH PROPOFOL;  Surgeon: [name], MD;  Location: [facility] ENDOSCOPY;  Service: Endoscopy;  Laterality: N/A;   CORONARY ANGIOPLASTY     LAD stent later additional stent placed 02/2006 branch of LAD   CYSTOSCOPY WITH INSERTION OF UROLIFT N/A 07/15/2020   Procedure: CYSTOSCOPY WITH INSERTION OF UROLIFT;  Surgeon: [name], MD;  Location: [facility] ORS;  Service: Urology;  Laterality: N/A;   CYSTOSCOPY WITH INSERTION OF UROLIFT N/A 11/03/2021   Procedure: CYSTOSCOPY WITH INSERTION OF UROLIFT;  Surgeon: [name], MD;  Location: [facility] ORS;  Service: Urology;  Laterality: N/A;   CYSTOSCOPY WITH LITHOLAPAXY N/A 07/15/2020   Procedure: CYSTOSCOPY WITH LITHOLAPAXY;  Surgeon: [name], MD;  Location: [facility] ORS;  Service: Urology;  Laterality: N/A;   CYSTOSCOPY WITH LITHOLAPAXY N/A 11/03/2021   Procedure: CYSTOSCOPY WITH LITHOLAPAXY;  Surgeon: [name], MD;  Location: [facility] ORS;  Service: Urology;  Laterality: N/A;   EP IMPLANTABLE DEVICE N/A 03/18/2016   Procedure:  BiV ICD Generator Changeout;  Surgeon: [name], MD;  Location: [place] [facility];  Service: Cardiovascular;  Laterality: N/A;   ESOPHAGOGASTRODUODENOSCOPY (EGD) WITH PROPOFOL N/A 04/01/2018   Procedure: ESOPHAGOGASTRODUODENOSCOPY (EGD) WITH PROPOFOL;  Surgeon: [name], MD;  Location: [facility] ENDOSCOPY;  Service: Endoscopy;  Laterality: N/A;   Implantation of Medtronic dual-chamber cardioverter N/A 2007   Left Knee Replacement   12/21/2004   PROSTATE BIOPSY     TONSILLECTOMY  12/21/1946   TOTAL KNEE REVISION Left 06/20/2015   Procedure: TOTAL KNEE REVISION;  Surgeon: [name], MD;  Location: [facility] ORS;  Service: Orthopedics;  Laterality: Left;    Home Medications:  Allergies as of 12/24/2021       Reactions   Beta Adrenergic Blockers Other (See Comments)   Fatigue   Oxycodone Nausea And Vomiting   Sulfonamide Derivatives Nausea And Vomiting        Medication List        Accurate as of December 24, 2021  2:39 PM. If you have any questions, ask your nurse or doctor.  STOP taking these medications    sulfamethoxazole-trimethoprim 800-160 MG tablet Commonly known as: BACTRIM DS Stopped by: [name], MD       TAKE these medications    acetaminophen 500 MG tablet Commonly known as: TYLENOL Take 500 mg by mouth 2 (two) times daily.   albuterol 108 (90 Base) MCG/ACT inhaler Commonly known as: VENTOLIN HFA Inhale 1-2 puffs into the lungs every 6 (six) hours as needed for wheezing or shortness of breath.   bisoprolol 5 MG tablet Commonly known as: ZEBETA Take 1 tablet (5 mg total) by mouth daily.   CITRACAL + D PO Take 2 tablets by mouth daily.   dorzolamide 2 % ophthalmic solution Commonly known as: TRUSOPT Place 1 drop into the right eye 2 (two) times daily.   dutasteride 0.5 MG capsule Commonly known as: AVODART Take 1 capsule (0.5 mg total) by mouth every 3 (three) days. What changed: when to take this   Eliquis 5 MG Tabs tablet Generic drug: apixaban TAKE ONE TABLET TWICE DAILY   fluconazole 200 MG tablet Commonly known as: DIFLUCAN Take 200 mg by mouth once a week.   furosemide 40 MG tablet Commonly known as: LASIX TAKE 1 TABLET BY MOUTH EVERY OTHER DAY AS DIRECTED What changed:  how much to take how to take this when to take this additional instructions   glipiZIDE 5 MG 24 hr tablet Commonly known as: GLUCOTROL XL Take 5 mg by mouth daily.    HYDROcodone-acetaminophen 5-325 MG tablet Commonly known as: NORCO/VICODIN Take 1-2 tablets by mouth every 6 (six) hours as needed for moderate pain.   hydroxypropyl methylcellulose / hypromellose 2.5 % ophthalmic solution Commonly known as: ISOPTO TEARS / GONIOVISC Place 1 drop into both eyes 3 (three) times daily as needed for dry eyes.   ipratropium 0.06 % nasal spray Commonly known as: ATROVENT Place 1 spray into both nostrils daily as needed for rhinitis.   latanoprost 0.005 % ophthalmic solution Commonly known as: XALATAN Place 1 drop into the right eye at bedtime.   losartan 50 MG tablet Commonly known as: COZAAR Take 50 mg by mouth daily.   Melatonin 10 MG Tabs Take 10 mg by mouth See admin instructions. Take 10 mg by mouth every other night   metFORMIN 500 MG 24 hr tablet Commonly known as: GLUCOPHAGE-XR Take 500 mg by mouth in the morning and at bedtime.   montelukast 10 MG tablet Commonly known as: SINGULAIR Take 10 mg by mouth daily.   multivitamin tablet Take 2 tablets by mouth daily.   omeprazole 40 MG capsule Commonly known as: PRILOSEC Take 40 mg by mouth every morning.   rosuvastatin 40 MG tablet Commonly known as: CRESTOR Take 1 tablet (40 mg total) by mouth daily.   SINEX REGULAR NA Place 2 sprays into both nostrils daily as needed (congestion).   zolpidem 5 MG tablet Commonly known as: AMBIEN Take 5 mg by mouth See admin instructions. Take 5 mg by mouth every other night        Allergies:  Allergies  Allergen Reactions   Beta Adrenergic Blockers Other (See Comments)    Fatigue   Oxycodone Nausea And Vomiting   Sulfonamide Derivatives Nausea And Vomiting    Family History: Family History  Problem Relation Age of Onset   Emphysema Father    Lung cancer Father    Cirrhosis Mother    Hypertension Brother    Hypertension Son    Heart attack Neg Hx  Stroke Neg Hx     Social History:  reports that he has never smoked. He has  never used smokeless tobacco. He reports current alcohol use of about 7.0 standard drinks per week. He reports that he does not use drugs.   Physical Exam: BP (!) 144/80    Pulse 73    Ht 5\' 5"  (1.651 m)    Wt 180 lb (81.6 kg)    BMI 29.95 kg/m   Constitutional:  Alert and oriented, No acute distress. HEENT: [place] AT, moist mucus membranes.  Trachea midline, no masses. Cardiovascular: No clubbing, cyanosis, or edema. Respiratory: Normal respiratory effort, no increased work of breathing. Skin: No rashes, bruises or suspicious lesions. Neurologic: Grossly intact, no focal deficits, moving all 4 extremities. Psychiatric: Normal mood and affect.  Laboratory Data:  Lab Results  Component Value Date   CREATININE 0.90 09/17/2020   Pertinent Imaging: Results for orders placed or performed in visit on 12/24/21  BLADDER SCAN AMB NON-IMAGING  Result Value Ref Range   Scan Result 23 ml     Assessment & Plan:    BPH w/outlet obstruction  - S/p Urolift x 2  - His symptoms have improved  - He is emptying adequately today  - Will continue to monitor with IPSS, PVR, and PSA in the 1 year less if symptoms worsen he develops recurrent hematuria  Return in 1 year (12/24/2022) for IPSS, PVR,and PSA  I,Kailey Littlejohn,acting as a scribe for [name], MD.,have documented all relevant documentation on the behalf of [name], MD,as directed by  [name], MD while in the presence of [name], MD.  I have reviewed the above documentation for accuracy and completeness, and I agree with the above.   [name], MD   [facility] [address], [place], [place] [address] [phone]

## 2021-12-24 ENCOUNTER — Ambulatory Visit (INDEPENDENT_AMBULATORY_CARE_PROVIDER_SITE_OTHER): Payer: Medicare Other | Admitting: Urology

## 2021-12-24 ENCOUNTER — Other Ambulatory Visit: Payer: Self-pay

## 2021-12-24 VITALS — BP 144/80 | HR 73 | Ht 65.0 in | Wt 180.0 lb

## 2021-12-24 DIAGNOSIS — N400 Enlarged prostate without lower urinary tract symptoms: Secondary | ICD-10-CM

## 2021-12-24 LAB — BLADDER SCAN AMB NON-IMAGING: Scan Result: 23

## 2021-12-25 ENCOUNTER — Other Ambulatory Visit: Payer: Self-pay | Admitting: Internal Medicine

## 2021-12-25 NOTE — Telephone Encounter (Signed)
This is a [place] pt 

## 2021-12-26 ENCOUNTER — Ambulatory Visit (INDEPENDENT_AMBULATORY_CARE_PROVIDER_SITE_OTHER): Payer: Medicare Other

## 2021-12-26 DIAGNOSIS — I442 Atrioventricular block, complete: Secondary | ICD-10-CM | POA: Diagnosis not present

## 2021-12-29 LAB — CUP PACEART REMOTE DEVICE CHECK
Battery Remaining Longevity: 6 mo
Battery Voltage: 2.83 V
Brady Statistic AP VP Percent: 93.31 %
Brady Statistic AP VS Percent: 0.01 %
Brady Statistic AS VP Percent: 6.63 %
Brady Statistic AS VS Percent: 0.05 %
Brady Statistic RA Percent Paced: 93 %
Brady Statistic RV Percent Paced: 99.87 %
Date Time Interrogation Session: 20230106042214
HighPow Impedance: 52 Ohm
HighPow Impedance: 64 Ohm
Implantable Lead Implant Date: 20070615
Implantable Lead Implant Date: 20120813
Implantable Lead Implant Date: 20120813
Implantable Lead Location: 753858
Implantable Lead Location: 753859
Implantable Lead Location: 753860
Implantable Lead Model: 4396
Implantable Lead Model: 5076
Implantable Lead Model: 7121
Implantable Pulse Generator Implant Date: 20170329
Lead Channel Impedance Value: 228 Ohm
Lead Channel Impedance Value: 342 Ohm
Lead Channel Impedance Value: 361 Ohm
Lead Channel Impedance Value: 456 Ohm
Lead Channel Impedance Value: 589 Ohm
Lead Channel Impedance Value: 874 Ohm
Lead Channel Pacing Threshold Amplitude: 1.125 V
Lead Channel Pacing Threshold Pulse Width: 0.4 ms
Lead Channel Sensing Intrinsic Amplitude: 0.375 mV
Lead Channel Sensing Intrinsic Amplitude: 0.375 mV
Lead Channel Sensing Intrinsic Amplitude: 6.25 mV
Lead Channel Sensing Intrinsic Amplitude: 6.25 mV
Lead Channel Setting Pacing Amplitude: 1.75 V
Lead Channel Setting Pacing Amplitude: 2 V
Lead Channel Setting Pacing Amplitude: 2.5 V
Lead Channel Setting Pacing Pulse Width: 0.4 ms
Lead Channel Setting Pacing Pulse Width: 0.8 ms
Lead Channel Setting Sensing Sensitivity: 0.3 mV

## 2021-12-30 ENCOUNTER — Ambulatory Visit: Payer: Self-pay | Admitting: Urology

## 2022-01-06 NOTE — Progress Notes (Signed)
Remote ICD transmission.   

## 2022-01-15 NOTE — Progress Notes (Signed)
Monthly battery checks are scheduled.

## 2022-01-20 ENCOUNTER — Ambulatory Visit (INDEPENDENT_AMBULATORY_CARE_PROVIDER_SITE_OTHER): Payer: Medicare Other

## 2022-01-20 DIAGNOSIS — Z95810 Presence of automatic (implantable) cardiac defibrillator: Secondary | ICD-10-CM | POA: Diagnosis not present

## 2022-01-20 DIAGNOSIS — I5022 Chronic systolic (congestive) heart failure: Secondary | ICD-10-CM | POA: Diagnosis not present

## 2022-01-21 NOTE — Progress Notes (Signed)
EPIC Encounter for ICM Monitoring  Patient Name: [name]. is a 81 y.o. male Date: 01/21/2022 Primary Care Physican: Derinda Late, MD Primary Cardiologist: [name] Electrophysiologist: Vergie Living Pacing: 99.9%         01/21/2022 Weight: 180 lbs  Time in AT/AF <0.1 hr/day (<0.1%) Longest AT/AF 3 minutes Battery ERI: 6 months                                                            Spoke with patient and heart failure questions reviewed.  Pt had some weight gain and after taking Furosemide today he lost 4 lbs.   Optivol thoracic impedance suggesting trending just below baseline.       Prescribed:  Furosemide 40 mg Take 40 mg by mouth every other day   Recommendations:   No changes and encouraged to call if experiencing any fluid symptoms.   Follow-up plan: ICM clinic phone appointment on 02/23/2022.   91 day device clinic remote transmission 03/27/2022.     EP/Cardiology Office Visits:  04/07/2022  with Dr. [name].     Copy of ICM check sent to Dr. [name].     3 month ICM trend: 01/20/2022.    12-14 Month ICM trend:     Ronnie Billings, RN 01/21/2022 3:23 PM

## 2022-02-01 ENCOUNTER — Other Ambulatory Visit: Payer: Self-pay | Admitting: Internal Medicine

## 2022-02-02 NOTE — Telephone Encounter (Signed)
Prescription refill request for Eliquis received. Indication: Afib  Last office visit: 10/16/21 [name])  Scr: 0.8 (01/12/22) Age: 81 Weight: 91.6kg  Appropriate dose and refill sent to requested pharmacy.

## 2022-02-20 ENCOUNTER — Ambulatory Visit (INDEPENDENT_AMBULATORY_CARE_PROVIDER_SITE_OTHER): Payer: Medicare Other

## 2022-02-20 DIAGNOSIS — I255 Ischemic cardiomyopathy: Secondary | ICD-10-CM

## 2022-02-20 LAB — CUP PACEART REMOTE DEVICE CHECK
Battery Remaining Longevity: 4 mo
Battery Voltage: 2.81 V
Brady Statistic AP VP Percent: 93.05 %
Brady Statistic AP VS Percent: 0 %
Brady Statistic AS VP Percent: 6.91 %
Brady Statistic AS VS Percent: 0.04 %
Brady Statistic RA Percent Paced: 92.79 %
Brady Statistic RV Percent Paced: 99.83 %
Date Time Interrogation Session: 20230303012306
HighPow Impedance: 50 Ohm
HighPow Impedance: 63 Ohm
Implantable Lead Implant Date: 20070615
Implantable Lead Implant Date: 20120813
Implantable Lead Implant Date: 20120813
Implantable Lead Location: 753858
Implantable Lead Location: 753859
Implantable Lead Location: 753860
Implantable Lead Model: 4396
Implantable Lead Model: 5076
Implantable Lead Model: 7121
Implantable Pulse Generator Implant Date: 20170329
Lead Channel Impedance Value: 247 Ohm
Lead Channel Impedance Value: 361 Ohm
Lead Channel Impedance Value: 361 Ohm
Lead Channel Impedance Value: 418 Ohm
Lead Channel Impedance Value: 513 Ohm
Lead Channel Impedance Value: 817 Ohm
Lead Channel Pacing Threshold Amplitude: 1.125 V
Lead Channel Pacing Threshold Pulse Width: 0.4 ms
Lead Channel Sensing Intrinsic Amplitude: 0.5 mV
Lead Channel Sensing Intrinsic Amplitude: 0.5 mV
Lead Channel Sensing Intrinsic Amplitude: 6.25 mV
Lead Channel Sensing Intrinsic Amplitude: 6.25 mV
Lead Channel Setting Pacing Amplitude: 1.75 V
Lead Channel Setting Pacing Amplitude: 2 V
Lead Channel Setting Pacing Amplitude: 2.5 V
Lead Channel Setting Pacing Pulse Width: 0.4 ms
Lead Channel Setting Pacing Pulse Width: 0.8 ms
Lead Channel Setting Sensing Sensitivity: 0.3 mV

## 2022-02-23 ENCOUNTER — Ambulatory Visit (INDEPENDENT_AMBULATORY_CARE_PROVIDER_SITE_OTHER): Payer: Medicare Other

## 2022-02-23 DIAGNOSIS — I5022 Chronic systolic (congestive) heart failure: Secondary | ICD-10-CM | POA: Diagnosis not present

## 2022-02-23 DIAGNOSIS — Z95810 Presence of automatic (implantable) cardiac defibrillator: Secondary | ICD-10-CM

## 2022-02-24 ENCOUNTER — Telehealth: Payer: Self-pay

## 2022-02-24 NOTE — Progress Notes (Signed)
Spoke with patient and heart failure questions reviewed.  Pt reports 2-3 pound weight gain over the last week.  Last week 179 last week and 182 lbs on 3/6.  Weight dropped to 180 lbs this AM after taking Furosemide today.  He has not changed diet and taking Lasix every other day as prescribed.  Advised to take Lasix 1 tablet for 3 consecutive days and returned to every other day after 3rd day.  Will recheck fluid levels on 3/10.  ?

## 2022-02-24 NOTE — Telephone Encounter (Signed)
Remote ICM transmission received.  Attempted call to patient regarding ICM remote transmission and no answer, voice mail not set up ? ?

## 2022-02-24 NOTE — Progress Notes (Signed)
EPIC Encounter for ICM Monitoring ? ?Patient Name: [name]. is a 81 y.o. male ?Date: 02/24/2022 ?Primary Care Physican: Derinda Late, MD ?Primary Cardiologist: [name] ?Electrophysiologist: [name] ?Bi-V Pacing: 99.9%         ?01/21/2022 Weight: 180 lbs ?  ?Time in AT/AF  0.0 hr/day (0.0%) ?Battery ERI: 4 months ?  ?                                                         ?Attempted call to patient and unable to reach.   Transmission reviewed.  ?  ?Optivol thoracic impedance suggesting possible fluid accumulation 2/28.     ?  ?Prescribed:  ?Furosemide 40 mg Take 40 mg by mouth every other day ?  ?Labs: ?01/12/2022 Creatinine 0.8, BUN 21, Potassium 4.5, Sodium 142, GFR 93 ?A complete set of results can be found in Results Review. ? ?Recommendations:  Unable to reach.   ?  ?Follow-up plan: ICM clinic phone appointment on 03/02/2022 to recheck fluid levels.   91 day device clinic remote transmission 03/27/2022.   ?  ?EP/Cardiology Office Visits:  04/07/2022  with Dr. [name].   ?  ?Copy of ICM check sent to Dr. [name].    ? ?3 month ICM trend: 02/23/2022. ? ? ? ?12-14 Month ICM trend:  ? ? ? ?Rosalene Billings, RN ?02/24/2022 ?11:19 AM ? ?

## 2022-02-27 ENCOUNTER — Ambulatory Visit (INDEPENDENT_AMBULATORY_CARE_PROVIDER_SITE_OTHER): Payer: Medicare Other

## 2022-02-27 DIAGNOSIS — I5022 Chronic systolic (congestive) heart failure: Secondary | ICD-10-CM

## 2022-02-27 DIAGNOSIS — Z95810 Presence of automatic (implantable) cardiac defibrillator: Secondary | ICD-10-CM

## 2022-02-27 NOTE — Progress Notes (Signed)
EPIC Encounter for ICM Monitoring ? ?Patient Name: [name]. is a 81 y.o. male ?Date: 02/27/2022 ?Primary Care Physican: Derinda Late, MD ?Primary Cardiologist: [name] ?Electrophysiologist: [name] ?Bi-V Pacing: 99.9%         ?01/21/2022 Weight: 180 lbs ?  ?Time in AT/AF  0.0 hr/day (0.0%) ?Battery ERI: 4 months ?  ?                                                         ?Spoke with patient and heart failure questions reviewed.  Pt asymptomatic for fluid accumulation.  Reports feeling well at this time and voices no complaints.  ?  ?Optivol thoracic impedance suggesting fluid levels returned to normal after taking Furosemide x 3 consecutive days.     ?  ?Prescribed:  ?Furosemide 40 mg Take 40 mg by mouth every other day ?  ?Labs: ?01/12/2022 Creatinine 0.8, BUN 21, Potassium 4.5, Sodium 142, GFR 93 ?A complete set of results can be found in Results Review. ?  ?Recommendations:  No changes and encouraged to call if experiencing any fluid symptoms. ?  ?Follow-up plan: ICM clinic phone appointment on 03/30/2022.   91 day device clinic remote transmission 03/27/2022.   ?  ?EP/Cardiology Office Visits:  04/07/2022  with Dr. [name].   ?  ?Copy of ICM check sent to Dr. [name].   ? ?3 month ICM trend: 02/27/2022. ? ? ? ?12-14 Month ICM trend:  ? ? ? ?Rosalene Billings, RN ?02/27/2022 ?11:36 AM ? ?

## 2022-03-02 NOTE — Progress Notes (Signed)
Remote ICD transmission.   

## 2022-03-24 ENCOUNTER — Other Ambulatory Visit: Payer: Self-pay | Admitting: Internal Medicine

## 2022-03-27 ENCOUNTER — Ambulatory Visit (INDEPENDENT_AMBULATORY_CARE_PROVIDER_SITE_OTHER): Payer: Medicare Other

## 2022-03-27 DIAGNOSIS — I5022 Chronic systolic (congestive) heart failure: Secondary | ICD-10-CM

## 2022-03-27 DIAGNOSIS — I442 Atrioventricular block, complete: Secondary | ICD-10-CM

## 2022-03-30 ENCOUNTER — Ambulatory Visit (INDEPENDENT_AMBULATORY_CARE_PROVIDER_SITE_OTHER): Payer: Medicare Other

## 2022-03-30 DIAGNOSIS — I5022 Chronic systolic (congestive) heart failure: Secondary | ICD-10-CM

## 2022-03-30 DIAGNOSIS — Z95810 Presence of automatic (implantable) cardiac defibrillator: Secondary | ICD-10-CM

## 2022-03-30 LAB — CUP PACEART REMOTE DEVICE CHECK
Battery Remaining Longevity: 4 mo
Battery Voltage: 2.8 V
Brady Statistic AP VP Percent: 99.44 %
Brady Statistic AP VS Percent: 0.01 %
Brady Statistic AS VP Percent: 0.55 %
Brady Statistic AS VS Percent: 0 %
Brady Statistic RA Percent Paced: 99.43 %
Brady Statistic RV Percent Paced: 99.9 %
Date Time Interrogation Session: 20230407033523
HighPow Impedance: 51 Ohm
HighPow Impedance: 63 Ohm
Implantable Lead Implant Date: 20070615
Implantable Lead Implant Date: 20120813
Implantable Lead Implant Date: 20120813
Implantable Lead Location: 753858
Implantable Lead Location: 753859
Implantable Lead Location: 753860
Implantable Lead Model: 4396
Implantable Lead Model: 5076
Implantable Lead Model: 7121
Implantable Pulse Generator Implant Date: 20170329
Lead Channel Impedance Value: 228 Ohm
Lead Channel Impedance Value: 342 Ohm
Lead Channel Impedance Value: 361 Ohm
Lead Channel Impedance Value: 456 Ohm
Lead Channel Impedance Value: 532 Ohm
Lead Channel Impedance Value: 893 Ohm
Lead Channel Pacing Threshold Amplitude: 1.125 V
Lead Channel Pacing Threshold Pulse Width: 0.4 ms
Lead Channel Sensing Intrinsic Amplitude: 0.625 mV
Lead Channel Sensing Intrinsic Amplitude: 0.625 mV
Lead Channel Sensing Intrinsic Amplitude: 6.25 mV
Lead Channel Sensing Intrinsic Amplitude: 6.25 mV
Lead Channel Setting Pacing Amplitude: 1.75 V
Lead Channel Setting Pacing Amplitude: 2 V
Lead Channel Setting Pacing Amplitude: 2.5 V
Lead Channel Setting Pacing Pulse Width: 0.4 ms
Lead Channel Setting Pacing Pulse Width: 0.8 ms
Lead Channel Setting Sensing Sensitivity: 0.3 mV

## 2022-04-01 NOTE — Progress Notes (Signed)
EPIC Encounter for ICM Monitoring ? ?Patient Name: [name]. is a 81 y.o. male ?Date: 04/01/2022 ?Primary Care Physican: Derinda Late, MD ?Primary Cardiologist: [name] ?Electrophysiologist: [name] ?Bi-V Pacing: 99.9%         ?01/21/2022 Weight: 180 lbs ?  ?Time in AT/AF  0.0 hr/day (0.0%) ?Battery ERI: 4 months ?  ?                                                         ?Spoke with patient and heart failure questions reviewed.  Pt asymptomatic for fluid accumulation.  Reports feeling well at this time and voices no complaints.  ?  ?Optivol thoracic impedance suggesting normal fluid levels.     ?  ?Prescribed:  ?Furosemide 40 mg Take 40 mg by mouth every other day ?  ?Labs: ?01/12/2022 Creatinine 0.8, BUN 21, Potassium 4.5, Sodium 142, GFR 93 ?A complete set of results can be found in Results Review. ?  ?Recommendations:  No changes and encouraged to call if experiencing any fluid symptoms. ?  ?Follow-up plan: ICM clinic phone appointment on 05/04/2022.   91 day device clinic remote transmission 06/25/2022.   ?  ?EP/Cardiology Office Visits:  04/07/2022  with Dr. [name].   ?  ?Copy of ICM check sent to Dr. [name].   ? ?3 month ICM trend: 03/30/2022. ? ? ? ?12-14 Month ICM trend:  ? ? ? ?Rosalene Billings, RN ?04/01/2022 ?1:41 PM ? ?

## 2022-04-02 ENCOUNTER — Other Ambulatory Visit: Payer: Self-pay | Admitting: Internal Medicine

## 2022-04-07 ENCOUNTER — Ambulatory Visit (INDEPENDENT_AMBULATORY_CARE_PROVIDER_SITE_OTHER): Payer: Medicare Other | Admitting: Internal Medicine

## 2022-04-07 ENCOUNTER — Encounter: Payer: Self-pay | Admitting: Internal Medicine

## 2022-04-07 VITALS — BP 112/72 | HR 62 | Ht 65.0 in | Wt 187.6 lb

## 2022-04-07 DIAGNOSIS — I480 Paroxysmal atrial fibrillation: Secondary | ICD-10-CM

## 2022-04-07 DIAGNOSIS — Z95810 Presence of automatic (implantable) cardiac defibrillator: Secondary | ICD-10-CM | POA: Diagnosis not present

## 2022-04-07 DIAGNOSIS — I442 Atrioventricular block, complete: Secondary | ICD-10-CM | POA: Diagnosis not present

## 2022-04-07 DIAGNOSIS — I5022 Chronic systolic (congestive) heart failure: Secondary | ICD-10-CM | POA: Diagnosis not present

## 2022-04-07 DIAGNOSIS — I255 Ischemic cardiomyopathy: Secondary | ICD-10-CM

## 2022-04-07 LAB — CUP PACEART INCLINIC DEVICE CHECK
Date Time Interrogation Session: 20230418164521
Implantable Lead Implant Date: 20070615
Implantable Lead Implant Date: 20120813
Implantable Lead Implant Date: 20120813
Implantable Lead Location: 753858
Implantable Lead Location: 753859
Implantable Lead Location: 753860
Implantable Lead Model: 4396
Implantable Lead Model: 5076
Implantable Lead Model: 7121
Implantable Pulse Generator Implant Date: 20170329

## 2022-04-07 MED ORDER — FUROSEMIDE 40 MG PO TABS: ORAL_TABLET | ORAL | 2 refills | Status: DC

## 2022-04-07 NOTE — Progress Notes (Signed)
Patient Care Team: ?Derinda Late, MD as PCP - General (Family Medicine) ?[name], MD as PCP - Cardiology (Cardiology) ?Rocco Serene, MD (Unknown Physician Specialty) ? ? ?HPI ? ?[name]. is a 81 y.o. male ?Seen in followup for ICD implantation initially for syncope in the setting of depressed left ventricular function and ischemic heart disease with remote LAD stenting.  He had a 6949-lead. He reached ERI summer 2012 and underwent CRT upgrade with new defibrillator lead insertion  Generator replacement again 3/17  ?  ?Atrial fibrillation persistent-- anticoagulation with Apixoban without bleeding  - ?  ?  ? ?The patient denies chest pain, nocturnal dyspnea, orthopnea or peripheral edema.  There have been no palpitations, lightheadedness or syncope.  Complains of some shortness of breath..   His hands are getting better following his surgery but still limited ? ? ?DATE TEST EF   ?2012 Cath  40 % Patent LAD stent  ?12/15 Echo   55-65 %   ?7/20 Echo  50-55%   ?10/21 Myoview  46% No Ischemia  ?     ?  ? ?Date K Cr Hgb LDL  ?6/18 4.3 0.9 14   ?2/19 4.1 0.9 12.5   ?3/21 4.6 0.92 13.8   ?2/22 4.2 0.8 12.2 76  ?7/22 4.7 0.8 11.2 53  ?1/23 4.5 0.8 11.867   ?  ?Has moved to independent living and loves it ?  ?Thromboembolic risk factors ( age  -2, HTN-1, TIA/CVA-2, Vasc disease -1, CHF -1) for a CHADSVASc Score of >=7 ? ?  ? ?Past Medical History:  ?Diagnosis Date  ? (HFpEF) heart failure with preserved ejection fraction ([place])   ? a.) TTE 10/30/2010: EF 30-35%; sev anterior wall and apical HK. b.) TTE 11/20/2014: EF 50-55%; no RWMAs. c.) TTE 07/06/2019: EF 50-55%; no RWMAs.  ? Allergic rhinitis   ? Anemia   ? Aortic atherosclerosis ([place])   ? Arthritis   ? Asthma   ? Atrial fibrillation ([place])   ? a.) CHA2DS2-VASc = 8 (age x 2, CHF, HTN, TIA x 2, prior MI, T2DM). b.) rate/rhythm maintained with bisoprolol; chronically anticoagulated with apixaban.  ? Biventricular ICD (implantable  cardioverter-defibrillator) in place 2007  ? a.) Medtronic device placed in 2007 with CRT upgrade in 07/2011. Generator changed 02/2016.  ? BPH (benign prostatic hyperplasia)   ? CAD (coronary artery disease) 01/14/1994  ? a.) cardiac arrest; ROSC achieved. LHC 01/14/1994 showed 95% pLAD stenosis. PCI performed --> 3.5 x 15 mm JJIS to pLAD.  b.) PCI performed in 2007 in [place], [place]; type and location of stent unknown. c.) LHC 2012 --> patent LAD stent; nonobstructive disease.  ? Cardiac arrest [facility])   ? Chronic lower back pain   ? Current use of long term anticoagulation   ? a.) apixaban  ? Dyspnea   ? PFT 12/19/10: FEV1 2.93(107%), FEV1% 72, TLC 8.02(138%), DLCO 110%, +BD  ? ED (erectile dysfunction)   ? Elevated PSA   ? Encysted hydrocele   ? GERD (gastroesophageal reflux disease)   ? High-grade heart block   ? a.) BiV ICD in place  ? History of kidney stones   ? Hyperlipidemia   ? Hypertension   ? Insomnia   ? Ischemic cardiomyopathy with implantable cardioverter-defibrillator (ICD)   ? Myocardial infarction [facility])   ? Sciatic nerve pain   ? Severe sinus bradycardia   ? a.) s/p Medtronic BiV ICD placement  ? Syncope   ? T2DM (type 2 diabetes  mellitus) ([place])   ? TIA (transient ischemic attack) 10/2010  ? ? ?Past Surgical History:  ?Procedure Laterality Date  ? CATARACT EXTRACTION, BILATERAL    ? COLONOSCOPY WITH PROPOFOL N/A 04/01/2018  ? Procedure: COLONOSCOPY WITH PROPOFOL;  Surgeon: [name], MD;  Location: [facility] ENDOSCOPY;  Service: Endoscopy;  Laterality: N/A;  ? CORONARY ANGIOPLASTY    ? LAD stent later additional stent placed 02/2006 branch of LAD  ? CYSTOSCOPY WITH INSERTION OF UROLIFT N/A 07/15/2020  ? Procedure: CYSTOSCOPY WITH INSERTION OF UROLIFT;  Surgeon: [name], MD;  Location: [facility] ORS;  Service: Urology;  Laterality: N/A;  ? CYSTOSCOPY WITH INSERTION OF UROLIFT N/A 11/03/2021  ? Procedure: CYSTOSCOPY WITH INSERTION OF UROLIFT;  Surgeon: [name], MD;  Location: [facility] ORS;   Service: Urology;  Laterality: N/A;  ? CYSTOSCOPY WITH LITHOLAPAXY N/A 07/15/2020  ? Procedure: CYSTOSCOPY WITH LITHOLAPAXY;  Surgeon: [name], MD;  Location: [facility] ORS;  Service: Urology;  Laterality: N/A;  ? CYSTOSCOPY WITH LITHOLAPAXY N/A 11/03/2021  ? Procedure: CYSTOSCOPY WITH LITHOLAPAXY;  Surgeon: [name], MD;  Location: [facility] ORS;  Service: Urology;  Laterality: N/A;  ? EP IMPLANTABLE DEVICE N/A 03/18/2016  ? Procedure:  BiV ICD Generator Changeout;  Surgeon: [name], MD;  Location: [place] CV LAB;  Service: Cardiovascular;  Laterality: N/A;  ? ESOPHAGOGASTRODUODENOSCOPY (EGD) WITH PROPOFOL N/A 04/01/2018  ? Procedure: ESOPHAGOGASTRODUODENOSCOPY (EGD) WITH PROPOFOL;  Surgeon: [name], MD;  [facility] ENDOSCOPY;  Service: Endoscopy;  Laterality: N/A;  ? Implantation of Medtronic dual-chamber cardioverter N/A 2007  ? Left Knee Replacement  12/21/2004  ? PROSTATE BIOPSY    ? TONSILLECTOMY  12/21/1946  ? TOTAL KNEE REVISION Left 06/20/2015  ? Procedure: TOTAL KNEE REVISION;  Surgeon: [name], MD;  Location: [facility] ORS;  Service: Orthopedics;  Laterality: Left;  ? ? ?Current Outpatient Medications  ?Medication Sig Dispense Refill  ? acetaminophen (TYLENOL) 500 MG tablet Take 500 mg by mouth 2 (two) times daily.     ? albuterol (VENTOLIN HFA) 108 (90 Base) MCG/ACT inhaler Inhale 1-2 puffs into the lungs every 6 (six) hours as needed for wheezing or shortness of breath.    ? bisoprolol (ZEBETA) 5 MG tablet Take 1 tablet (5 mg total) by mouth daily. 30 tablet 6  ? Calcium Citrate-Vitamin D (CITRACAL + D PO) Take 2 tablets by mouth daily.     ? dorzolamide (TRUSOPT) 2 % ophthalmic solution Place 1 drop into the right eye 2 (two) times daily.    ? dutasteride (AVODART) 0.5 MG capsule Take 1 capsule (0.5 mg total) by mouth every 3 (three) days. (Patient taking differently: Take 0.5 mg by mouth daily.) 90 capsule 0  ? ELIQUIS 5 MG TABS tablet TAKE ONE TABLET TWICE DAILY 180  tablet 1  ? fluconazole (DIFLUCAN) 200 MG tablet Take 200 mg by mouth once a week.    ? fluticasone (FLONASE) 50 MCG/ACT nasal spray Place 2 sprays into both nostrils daily.    ? furosemide (LASIX) 40 MG tablet TAKE 1 TABLET BY MOUTH EVERY OTHER DAY AS DIRECTED 45 tablet 0  ? glipiZIDE (GLUCOTROL XL) 5 MG 24 hr tablet Take 5 mg by mouth daily.    ? HYDROcodone-acetaminophen (NORCO/VICODIN) 5-325 MG tablet Take 1-2 tablets by mouth every 6 (six) hours as needed for moderate pain. 6 tablet 0  ? hydroxypropyl methylcellulose / hypromellose (ISOPTO TEARS / GONIOVISC) 2.5 % ophthalmic solution Place 1 drop into both eyes 3 (three) times daily as  needed for dry eyes.    ? ipratropium (ATROVENT) 0.06 % nasal spray Place 1 spray into both nostrils daily as needed for rhinitis.    ? latanoprost (XALATAN) 0.005 % ophthalmic solution Place 1 drop into the right eye at bedtime.     ? losartan (COZAAR) 50 MG tablet Take 50 mg by mouth daily.    ? Melatonin 10 MG TABS Take 10 mg by mouth See admin instructions. Take 10 mg by mouth every other night    ? metFORMIN (GLUCOPHAGE-XR) 500 MG 24 hr tablet Take 500 mg by mouth in the morning and at bedtime.     ? montelukast (SINGULAIR) 10 MG tablet Take 10 mg by mouth daily.     ? Multiple Vitamin (MULTIVITAMIN) tablet Take 2 tablets by mouth daily.    ? omeprazole (PRILOSEC) 40 MG capsule Take 40 mg by mouth every morning.    ? Phenylephrine HCl (SINEX REGULAR NA) Place 2 sprays into both nostrils daily as needed (congestion).    ? rosuvastatin (CRESTOR) 40 MG tablet TAKE 1 TABLET BY MOUTH DAILY 90 tablet 0  ? zolpidem (AMBIEN) 5 MG tablet Take 5 mg by mouth See admin instructions. Take 5 mg by mouth every other night    ? ?No current facility-administered medications for this visit.  ? ? ?Allergies  ?Allergen Reactions  ? Beta Adrenergic Blockers Other (See Comments)  ?  Fatigue  ? Oxycodone Nausea And Vomiting  ? Sulfonamide Derivatives Nausea And Vomiting  ? ? ?Review of Systems  negative except from HPI and PMH ? ?Physical Exam ?Ht '5\' 5"'$  (1.651 m)   Wt 187 lb 9.6 oz (85.1 kg)   BMI 31.22 kg/m?  ?Well developed and well nourished in no acute distress ?HENT normal ?Neck supple with JVP-flat ?Clear ?Device

## 2022-04-07 NOTE — Patient Instructions (Addendum)
Medication Instructions:  ?- Your physician has recommended you make the following change in your medication:  ? ?1) INCREASE lasix (furosemide) 40 mg: ? - take 1.5 tablets (60 mg) by mouth once EVERY OTHER day ? ? ?- Discuss Jardiance/ Farxiga with Dr. Loney Hering (diabetic and cardiovascular benefit) ? ?*If you need a refill on your cardiac medications before your next appointment, please call your pharmacy* ? ? ?Lab Work: ?- none ordered ? ?If you have labs (blood work) drawn today and your tests are completely normal, you will receive your results only by: ?MyChart Message (if you have MyChart) OR ?A paper copy in the mail ?If you have any lab test that is abnormal or we need to change your treatment, we will call you to review the results. ? ? ?Testing/Procedures: ?- none ordered ? ? ?Follow-Up: ?At [facility], you and your health needs are our priority.  As part of our continuing mission to provide you with exceptional heart care, we have created designated Provider Care Teams.  These Care Teams include your primary Cardiologist (physician) and Advanced Practice Providers (APPs -  Physician Assistants and Nurse Practitioners) who all work together to provide you with the care you need, when you need it. ? ?We recommend signing up for the patient portal called "MyChart".  Sign up information is provided on this After Visit Summary.  MyChart is used to connect with patients for Virtual Visits (Telemedicine).  Patients are able to view lab/test results, encounter notes, upcoming appointments, etc.  Non-urgent messages can be sent to your provider as well.   ?To learn more about what you can do with MyChart, go to [email or web address].   ? ?Your next appointment:   ?6 month(s) ? ?The format for your next appointment:   ?In Person ? ?Provider:   ?Virl Axe, MD  ? ? ?Other Instructions ?N/a ? ?Important Information About Sugar ? ? ? ? ? ? ?

## 2022-04-11 ENCOUNTER — Encounter: Payer: Self-pay | Admitting: Internal Medicine

## 2022-04-13 NOTE — Progress Notes (Signed)
Remote ICD transmission.   

## 2022-04-13 NOTE — Telephone Encounter (Signed)
Please see below for changes made at last OV. ? ? ?

## 2022-04-21 ENCOUNTER — Ambulatory Visit (INDEPENDENT_AMBULATORY_CARE_PROVIDER_SITE_OTHER): Payer: Medicare Other | Admitting: Internal Medicine

## 2022-04-21 ENCOUNTER — Encounter: Payer: Self-pay | Admitting: Internal Medicine

## 2022-04-21 VITALS — BP 120/70 | HR 62 | Ht 66.0 in | Wt 183.0 lb

## 2022-04-21 DIAGNOSIS — I255 Ischemic cardiomyopathy: Secondary | ICD-10-CM | POA: Diagnosis not present

## 2022-04-21 DIAGNOSIS — I5022 Chronic systolic (congestive) heart failure: Secondary | ICD-10-CM

## 2022-04-21 DIAGNOSIS — I442 Atrioventricular block, complete: Secondary | ICD-10-CM | POA: Diagnosis not present

## 2022-04-21 DIAGNOSIS — Z95810 Presence of automatic (implantable) cardiac defibrillator: Secondary | ICD-10-CM | POA: Diagnosis not present

## 2022-04-21 DIAGNOSIS — I480 Paroxysmal atrial fibrillation: Secondary | ICD-10-CM

## 2022-04-21 LAB — PACEMAKER DEVICE OBSERVATION

## 2022-04-21 NOTE — Patient Instructions (Signed)
Medication Instructions:  ?- Your physician recommends that you continue on your current medications as directed. Please refer to the Current Medication list given to you today. ? ?*If you need a refill on your cardiac medications before your next appointment, please call your pharmacy* ? ? ?Lab Work: ?- none ordered ? ?If you have labs (blood work) drawn today and your tests are completely normal, you will receive your results only by: ?MyChart Message (if you have MyChart) OR ?A paper copy in the mail ?If you have any lab test that is abnormal or we need to change your treatment, we will call you to review the results. ? ? ?Testing/Procedures: ?- none ordered ? ? ?Follow-Up: ?At [facility], you and your health needs are our priority.  As part of our continuing mission to provide you with exceptional heart care, we have created designated Provider Care Teams.  These Care Teams include your primary Cardiologist (physician) and Advanced Practice Providers (APPs -  Physician Assistants and Nurse Practitioners) who all work together to provide you with the care you need, when you need it. ? ?We recommend signing up for the patient portal called "MyChart".  Sign up information is provided on this After Visit Summary.  MyChart is used to connect with patients for Virtual Visits (Telemedicine).  Patients are able to view lab/test results, encounter notes, upcoming appointments, etc.  Non-urgent messages can be sent to your provider as well.   ?To learn more about what you can do with MyChart, go to [email or web address].   ? ?Your next appointment:   ?October 2023  ? ?The format for your next appointment:   ?In Person ? ?Provider:   ?Virl Axe, MD  ? ? ?Other Instructions ?N/a ? ?Important Information About Sugar ? ? ? ? ? ? ?

## 2022-04-21 NOTE — Progress Notes (Signed)
Patient Care Team: ?Derinda Late, MD as PCP - General (Family Medicine) ?[name], MD as PCP - Cardiology (Cardiology) ?Rocco Serene, MD (Unknown Physician Specialty) ? ? ?HPI ? ?[name]. is a 81 y.o. male ?Seen in followup for ICD implantation initially for syncope in the setting of depressed left ventricular function and ischemic heart disease with remote LAD stenting.  He had a 6949-lead. He reached ERI summer 2012 and underwent CRT upgrade with new defibrillator lead insertion  Generator replacement again 3/17  ?  ?Atrial fibrillation persistent-- anticoagulation with Apixoban without bleeding  - ?  ?  ? ?The patient denies chest pain, nocturnal dyspnea, orthopnea or peripheral edema.  There have been no palpitations, lightheadedness or syncope.  Complains of some shortness of breath..   His hands are getting better following his surgery but still limited ? ? ?DATE TEST EF   ?2012 Cath  40 % Patent LAD stent  ?12/15 Echo   55-65 %   ?7/20 Echo  50-55%   ?10/21 Myoview  46% No Ischemia  ?     ?  ? ?Date K Cr Hgb LDL  ?6/18 4.3 0.9 14   ?2/19 4.1 0.9 12.5   ?3/21 4.6 0.92 13.8   ?2/22 4.2 0.8 12.2 76  ?7/22 4.7 0.8 11.2 53  ?1/23 4.5 0.8 11.867   ?  ?Has moved to independent living and loves it ?  ?Thromboembolic risk factors ( age  -2, HTN-1, TIA/CVA-2, Vasc disease -1, CHF -1) for a CHADSVASc Score of >=7 ? ?  ? ?Past Medical History:  ?Diagnosis Date  ? (HFpEF) heart failure with preserved ejection fraction ([place])   ? a.) TTE 10/30/2010: EF 30-35%; sev anterior wall and apical HK. b.) TTE 11/20/2014: EF 50-55%; no RWMAs. c.) TTE 07/06/2019: EF 50-55%; no RWMAs.  ? Allergic rhinitis   ? Anemia   ? Aortic atherosclerosis ([place])   ? Arthritis   ? Asthma   ? Atrial fibrillation ([place])   ? a.) CHA2DS2-VASc = 8 (age x 2, CHF, HTN, TIA x 2, prior MI, T2DM). b.) rate/rhythm maintained with bisoprolol; chronically anticoagulated with apixaban.  ? Biventricular ICD (implantable  cardioverter-defibrillator) in place 2007  ? a.) Medtronic device placed in 2007 with CRT upgrade in 07/2011. Generator changed 02/2016.  ? BPH (benign prostatic hyperplasia)   ? CAD (coronary artery disease) 01/14/1994  ? a.) cardiac arrest; ROSC achieved. LHC 01/14/1994 showed 95% pLAD stenosis. PCI performed --> 3.5 x 15 mm JJIS to pLAD.  b.) PCI performed in 2007 in [place], [place]; type and location of stent unknown. c.) LHC 2012 --> patent LAD stent; nonobstructive disease.  ? Cardiac arrest [facility])   ? Chronic lower back pain   ? Current use of long term anticoagulation   ? a.) apixaban  ? Dyspnea   ? PFT 12/19/10: FEV1 2.93(107%), FEV1% 72, TLC 8.02(138%), DLCO 110%, +BD  ? ED (erectile dysfunction)   ? Elevated PSA   ? Encysted hydrocele   ? GERD (gastroesophageal reflux disease)   ? High-grade heart block   ? a.) BiV ICD in place  ? History of kidney stones   ? Hyperlipidemia   ? Hypertension   ? Insomnia   ? Ischemic cardiomyopathy with implantable cardioverter-defibrillator (ICD)   ? Myocardial infarction [facility])   ? Sciatic nerve pain   ? Severe sinus bradycardia   ? a.) s/p Medtronic BiV ICD placement  ? Syncope   ? T2DM (type 2 diabetes  mellitus) ([place])   ? TIA (transient ischemic attack) 10/2010  ? ? ?Past Surgical History:  ?Procedure Laterality Date  ? CATARACT EXTRACTION, BILATERAL    ? COLONOSCOPY WITH PROPOFOL N/A 04/01/2018  ? Procedure: COLONOSCOPY WITH PROPOFOL;  Surgeon: [name], MD;  Location: [facility] ENDOSCOPY;  Service: Endoscopy;  Laterality: N/A;  ? CORONARY ANGIOPLASTY    ? LAD stent later additional stent placed 02/2006 branch of LAD  ? CYSTOSCOPY WITH INSERTION OF UROLIFT N/A 07/15/2020  ? Procedure: CYSTOSCOPY WITH INSERTION OF UROLIFT;  Surgeon: [name], MD;  Location: [facility] ORS;  Service: Urology;  Laterality: N/A;  ? CYSTOSCOPY WITH INSERTION OF UROLIFT N/A 11/03/2021  ? Procedure: CYSTOSCOPY WITH INSERTION OF UROLIFT;  Surgeon: [name], MD;  Location: [facility] ORS;   Service: Urology;  Laterality: N/A;  ? CYSTOSCOPY WITH LITHOLAPAXY N/A 07/15/2020  ? Procedure: CYSTOSCOPY WITH LITHOLAPAXY;  Surgeon: [name], MD;  Location: [facility] ORS;  Service: Urology;  Laterality: N/A;  ? CYSTOSCOPY WITH LITHOLAPAXY N/A 11/03/2021  ? Procedure: CYSTOSCOPY WITH LITHOLAPAXY;  Surgeon: [name], MD;  Location: [facility] ORS;  Service: Urology;  Laterality: N/A;  ? EP IMPLANTABLE DEVICE N/A 03/18/2016  ? Procedure:  BiV ICD Generator Changeout;  Surgeon: [name], MD;  Location: [place] CV LAB;  Service: Cardiovascular;  Laterality: N/A;  ? ESOPHAGOGASTRODUODENOSCOPY (EGD) WITH PROPOFOL N/A 04/01/2018  ? Procedure: ESOPHAGOGASTRODUODENOSCOPY (EGD) WITH PROPOFOL;  Surgeon: [name], MD;  Location: [facility] ENDOSCOPY;  Service: Endoscopy;  Laterality: N/A;  ? Implantation of Medtronic dual-chamber cardioverter N/A 2007  ? Left Knee Replacement  12/21/2004  ? PROSTATE BIOPSY    ? TONSILLECTOMY  12/21/1946  ? TOTAL KNEE REVISION Left 06/20/2015  ? Procedure: TOTAL KNEE REVISION;  Surgeon: [name], MD;  Location: [facility] ORS;  Service: Orthopedics;  Laterality: Left;  ? ? ?Current Outpatient Medications  ?Medication Sig Dispense Refill  ? acetaminophen (TYLENOL) 500 MG tablet Take 500 mg by mouth 2 (two) times daily.     ? albuterol (VENTOLIN HFA) 108 (90 Base) MCG/ACT inhaler Inhale 1-2 puffs into the lungs every 6 (six) hours as needed for wheezing or shortness of breath.    ? bisoprolol (ZEBETA) 5 MG tablet Take 1 tablet (5 mg total) by mouth daily. 30 tablet 6  ? Calcium Citrate-Vitamin D (CITRACAL + D PO) Take 2 tablets by mouth daily.     ? cycloSPORINE (RESTASIS) 0.05 % ophthalmic emulsion Place 1 drop into both eyes 2 (two) times daily.    ? dorzolamide (TRUSOPT) 2 % ophthalmic solution Place 1 drop into the right eye 2 (two) times daily.    ? dutasteride (AVODART) 0.5 MG capsule Take 1 capsule (0.5 mg total) by mouth every 3 (three) days. 90 capsule 0  ? ELIQUIS 5 MG  TABS tablet TAKE ONE TABLET TWICE DAILY 180 tablet 1  ? fluticasone (FLONASE) 50 MCG/ACT nasal spray Place 2 sprays into both nostrils daily.    ? furosemide (LASIX) 40 MG tablet Take 1.5 tablets (60 mg) by mouth once EVERY OTHER day 60 tablet 2  ? glipiZIDE (GLUCOTROL XL) 5 MG 24 hr tablet Take 5 mg by mouth daily.    ? ipratropium (ATROVENT) 0.06 % nasal spray Place 1 spray into both nostrils daily as needed for rhinitis.    ? latanoprost (XALATAN) 0.005 % ophthalmic solution Place 1 drop into the right eye at bedtime.     ? losartan (COZAAR) 50 MG tablet Take 50 mg by mouth daily.    ?  Melatonin 10 MG TABS Take 10 mg by mouth See admin instructions. Take 10 mg by mouth every other night    ? metFORMIN (GLUCOPHAGE-XR) 500 MG 24 hr tablet Take 500 mg by mouth in the morning and at bedtime.     ? montelukast (SINGULAIR) 10 MG tablet Take 10 mg by mouth daily.     ? Multiple Vitamin (MULTIVITAMIN) tablet Take 2 tablets by mouth daily.    ? omeprazole (PRILOSEC) 40 MG capsule Take 40 mg by mouth every morning.    ? Phenylephrine HCl (SINEX REGULAR NA) Place 2 sprays into both nostrils daily as needed (congestion).    ? rosuvastatin (CRESTOR) 40 MG tablet TAKE 1 TABLET BY MOUTH DAILY 90 tablet 0  ? zolpidem (AMBIEN) 5 MG tablet Take 5 mg by mouth See admin instructions. Take 5 mg by mouth every other night    ? ?No current facility-administered medications for this visit.  ? ? ?Allergies  ?Allergen Reactions  ? Beta Adrenergic Blockers Other (See Comments)  ?  Fatigue  ? Oxycodone Nausea And Vomiting  ? Sulfonamide Derivatives Nausea And Vomiting  ? ? ?Review of Systems negative except from HPI and PMH ? ?Physical Exam ?BP 120/70 (BP Location: Left Arm, Patient Position: Sitting, Cuff Size: Normal)   Pulse 62   Ht '5\' 6"'$  (1.676 m)   Wt 183 lb (83 kg)   SpO2 97%   BMI 29.54 kg/m?  ?Well developed and well nourished in no acute distress ?HENT normal ?Neck supple with JVP-flat ?Clear ?Device pocket well healed;  without hematoma or erythema.  There is no tethering  ?Regular rate and rhythm, no murmur ?Abd-soft with active BS ?No Clubbing cyanosis 1-2+ edema ?Skin-warm and dry ?A & Oriented  Grossly normal sensory and motor function ? ?ECG si

## 2022-04-23 ENCOUNTER — Ambulatory Visit (INDEPENDENT_AMBULATORY_CARE_PROVIDER_SITE_OTHER): Payer: Medicare Other

## 2022-04-23 DIAGNOSIS — I255 Ischemic cardiomyopathy: Secondary | ICD-10-CM

## 2022-04-23 DIAGNOSIS — I5022 Chronic systolic (congestive) heart failure: Secondary | ICD-10-CM

## 2022-04-23 LAB — CUP PACEART REMOTE DEVICE CHECK
Battery Remaining Longevity: 4 mo
Battery Voltage: 2.77 V
Brady Statistic AP VP Percent: 91.01 %
Brady Statistic AP VS Percent: 0.03 %
Brady Statistic AS VP Percent: 8.95 %
Brady Statistic AS VS Percent: 0.01 %
Brady Statistic RA Percent Paced: 90.87 %
Brady Statistic RV Percent Paced: 99.92 %
Date Time Interrogation Session: 20230504022603
HighPow Impedance: 51 Ohm
HighPow Impedance: 65 Ohm
Implantable Lead Implant Date: 20070615
Implantable Lead Implant Date: 20120813
Implantable Lead Implant Date: 20120813
Implantable Lead Location: 753858
Implantable Lead Location: 753859
Implantable Lead Location: 753860
Implantable Lead Model: 4396
Implantable Lead Model: 5076
Implantable Lead Model: 7121
Implantable Pulse Generator Implant Date: 20170329
Lead Channel Impedance Value: 228 Ohm
Lead Channel Impedance Value: 361 Ohm
Lead Channel Impedance Value: 399 Ohm
Lead Channel Impedance Value: 418 Ohm
Lead Channel Impedance Value: 532 Ohm
Lead Channel Impedance Value: 874 Ohm
Lead Channel Pacing Threshold Amplitude: 1 V
Lead Channel Pacing Threshold Pulse Width: 0.4 ms
Lead Channel Sensing Intrinsic Amplitude: 0.375 mV
Lead Channel Sensing Intrinsic Amplitude: 0.375 mV
Lead Channel Sensing Intrinsic Amplitude: 6.25 mV
Lead Channel Sensing Intrinsic Amplitude: 6.25 mV
Lead Channel Setting Pacing Amplitude: 1.75 V
Lead Channel Setting Pacing Amplitude: 2 V
Lead Channel Setting Pacing Amplitude: 2.5 V
Lead Channel Setting Pacing Pulse Width: 0.4 ms
Lead Channel Setting Pacing Pulse Width: 0.8 ms
Lead Channel Setting Sensing Sensitivity: 0.3 mV

## 2022-05-04 ENCOUNTER — Ambulatory Visit (INDEPENDENT_AMBULATORY_CARE_PROVIDER_SITE_OTHER): Payer: Medicare Other

## 2022-05-04 DIAGNOSIS — Z95810 Presence of automatic (implantable) cardiac defibrillator: Secondary | ICD-10-CM

## 2022-05-04 DIAGNOSIS — I5022 Chronic systolic (congestive) heart failure: Secondary | ICD-10-CM

## 2022-05-06 NOTE — Progress Notes (Signed)
Remote ICD transmission.   

## 2022-05-08 NOTE — Progress Notes (Signed)
EPIC Encounter for ICM Monitoring  Patient Name: [name]. is a 81 y.o. male Date: 05/08/2022 Primary Care Physican: Derinda Late, MD Primary Cardiologist: [name] Electrophysiologist: Vergie Living Pacing: 99.8%         01/21/2022 Weight: 180 lbs 05/08/2022 Weight: 180 lbs   Time in AT/AF  0.0 hr/day (0.0%) Battery ERI: 4 months                                                            Spoke with patient and heart failure questions reviewed.  Pt had weight gain during decreased impedance which has resolved and back at baseline.  He has chronic leg swelling and no change.    Optivol thoracic impedance suggesting possible fluid accumulation from 5/5-5/11 and returned to baseline 5/12.   Prescribed:  Furosemide 40 mg Take 1.5 tablets (60 mg total) by mouth every other day.  05/08/2022 Confirmed he is taking 60 mg every other day   Labs: 01/12/2022 Creatinine 0.8, BUN 21, Potassium 4.5, Sodium 142, GFR 93 A complete set of results can be found in Results Review.   Recommendations:   No changes and encouraged to call if experiencing any fluid symptoms.   Follow-up plan: ICM clinic phone appointment on 06/08/2022.   91 day device clinic remote transmission 06/25/2022.     EP/Cardiology Office Visits:  Recall 10/04/2022  with Dr. [name].     Copy of ICM check sent to Dr. [name].    3 month ICM trend: 05/04/2022.    12-14 Month ICM trend:  Re   Rosalene Billings, RN 05/08/2022 10:29 AM

## 2022-05-25 ENCOUNTER — Ambulatory Visit (INDEPENDENT_AMBULATORY_CARE_PROVIDER_SITE_OTHER): Payer: Medicare Other

## 2022-05-25 DIAGNOSIS — I255 Ischemic cardiomyopathy: Secondary | ICD-10-CM

## 2022-05-25 LAB — CUP PACEART REMOTE DEVICE CHECK
Battery Remaining Longevity: 2 mo
Battery Voltage: 2.76 V
Brady Statistic AP VP Percent: 92.73 %
Brady Statistic AP VS Percent: 0.02 %
Brady Statistic AS VP Percent: 7.22 %
Brady Statistic AS VS Percent: 0.03 %
Brady Statistic RA Percent Paced: 92.39 %
Brady Statistic RV Percent Paced: 99.85 %
Date Time Interrogation Session: 20230605033424
HighPow Impedance: 51 Ohm
HighPow Impedance: 62 Ohm
Implantable Lead Implant Date: 20070615
Implantable Lead Implant Date: 20120813
Implantable Lead Implant Date: 20120813
Implantable Lead Location: 753858
Implantable Lead Location: 753859
Implantable Lead Location: 753860
Implantable Lead Model: 4396
Implantable Lead Model: 5076
Implantable Lead Model: 7121
Implantable Pulse Generator Implant Date: 20170329
Lead Channel Impedance Value: 228 Ohm
Lead Channel Impedance Value: 342 Ohm
Lead Channel Impedance Value: 361 Ohm
Lead Channel Impedance Value: 418 Ohm
Lead Channel Impedance Value: 513 Ohm
Lead Channel Impedance Value: 874 Ohm
Lead Channel Pacing Threshold Amplitude: 1.375 V
Lead Channel Pacing Threshold Pulse Width: 0.4 ms
Lead Channel Sensing Intrinsic Amplitude: 0.375 mV
Lead Channel Sensing Intrinsic Amplitude: 0.375 mV
Lead Channel Sensing Intrinsic Amplitude: 6.25 mV
Lead Channel Sensing Intrinsic Amplitude: 6.25 mV
Lead Channel Setting Pacing Amplitude: 1.75 V
Lead Channel Setting Pacing Amplitude: 2 V
Lead Channel Setting Pacing Amplitude: 2.75 V
Lead Channel Setting Pacing Pulse Width: 0.4 ms
Lead Channel Setting Pacing Pulse Width: 0.8 ms
Lead Channel Setting Sensing Sensitivity: 0.3 mV

## 2022-06-08 ENCOUNTER — Ambulatory Visit (INDEPENDENT_AMBULATORY_CARE_PROVIDER_SITE_OTHER): Payer: Medicare Other

## 2022-06-08 DIAGNOSIS — Z95810 Presence of automatic (implantable) cardiac defibrillator: Secondary | ICD-10-CM

## 2022-06-08 DIAGNOSIS — I5022 Chronic systolic (congestive) heart failure: Secondary | ICD-10-CM | POA: Diagnosis not present

## 2022-06-09 NOTE — Progress Notes (Signed)
EPIC Encounter for ICM Monitoring  Patient Name: [name]. is a 81 y.o. male Date: 06/09/2022 Primary Care Physican: Derinda Late, MD Primary Cardiologist: [name] Electrophysiologist: Vergie Living Pacing: 99.7%         01/21/2022 Weight: 180 lbs 05/08/2022 Weight: 180 lbs 06/07/2022 Weight: 183.5 lbs 06/09/2022 Weight: 180.5 lbs   Time in AT/AF  0.0 hr/day (0.0%) Battery ERI: 2 months                                                            Spoke with patient and heart failure questions reviewed.  Pt's weight increased over the weekend and this AM it was back to baseline.    Optivol thoracic impedance suggesting possible fluid accumulation starting 6/15.   Prescribed:  Furosemide 40 mg Take 1.5 tablets (60 mg total) by mouth every other day.     Labs: 01/12/2022 Creatinine 0.8, BUN 21, Potassium 4.5, Sodium 142, GFR 93 A complete set of results can be found in Results Review.   Recommendations:   No changes and encouraged to call if experiencing any fluid symptoms.   Follow-up plan: ICM clinic phone appointment on 06/15/2022 to recheck fluid levels.   91 day device clinic remote transmission 06/25/2022.     EP/Cardiology Office Visits:  Recall 10/04/2022  with Dr. [name].     Copy of ICM check sent to Dr. [name].    3 month ICM trend: 06/08/2022.    12-14 Month ICM trend:     Rosalene Billings, RN 06/09/2022 1:50 PM

## 2022-06-11 NOTE — Progress Notes (Signed)
Remote ICD transmission.   

## 2022-06-15 ENCOUNTER — Ambulatory Visit (INDEPENDENT_AMBULATORY_CARE_PROVIDER_SITE_OTHER): Payer: Medicare Other

## 2022-06-15 DIAGNOSIS — I5022 Chronic systolic (congestive) heart failure: Secondary | ICD-10-CM

## 2022-06-15 DIAGNOSIS — Z95810 Presence of automatic (implantable) cardiac defibrillator: Secondary | ICD-10-CM

## 2022-06-16 NOTE — Progress Notes (Signed)
EPIC Encounter for ICM Monitoring  Patient Name: [name]. is a 81 y.o. male Date: 06/16/2022 Primary Care Physican: Kandyce Rud, MD Primary Cardiologist: Graciela Husbands Electrophysiologist: Joycelyn Schmid Pacing: 99.6%         01/21/2022 Weight: 180 lbs 05/08/2022 Weight: 180 lbs 06/07/2022 Weight: 183.5 lbs 06/09/2022 Weight: 180.5 lbs 06/16/2022 Weight: 178.5 lbs   Time in AT/AF  0.0 hr/day (0.0%) Battery ERI: 2 months                                                            Spoke with patient and heart failure questions reviewed.  Pt's weight is back to baseline or below. He is feeling well today.    Optivol thoracic impedance suggesting possible fluid accumulation starting 6/15.   Prescribed:  Furosemide 40 mg Take 1.5 tablets (60 mg total) by mouth every other day.     Labs: 01/12/2022 Creatinine 0.8, BUN 21, Potassium 4.5, Sodium 142, GFR 93 A complete set of results can be found in Results Review.   Recommendations:   No changes and encouraged to call if experiencing any fluid symptoms.   Follow-up plan: ICM clinic phone appointment on 07/20/2022.   91 day device clinic remote transmission 06/25/2022.     EP/Cardiology Office Visits:  Recall 10/04/2022  with Dr. Graciela Husbands.     Copy of ICM check sent to Dr. Graciela Husbands.    3 month ICM trend: 06/15/2022.    12-14 Month ICM trend:     Karie Soda, RN 06/16/2022 3:33 PM

## 2022-06-25 ENCOUNTER — Ambulatory Visit (INDEPENDENT_AMBULATORY_CARE_PROVIDER_SITE_OTHER): Payer: Medicare Other

## 2022-06-25 DIAGNOSIS — I442 Atrioventricular block, complete: Secondary | ICD-10-CM

## 2022-06-25 LAB — CUP PACEART REMOTE DEVICE CHECK
Battery Remaining Longevity: 1 mo
Battery Voltage: 2.74 V
Brady Statistic AP VP Percent: 78.52 %
Brady Statistic AP VS Percent: 0.08 %
Brady Statistic AS VP Percent: 20.66 %
Brady Statistic AS VS Percent: 0.74 %
Brady Statistic RA Percent Paced: 76.41 %
Brady Statistic RV Percent Paced: 97.58 %
Date Time Interrogation Session: 20230706033323
HighPow Impedance: 49 Ohm
HighPow Impedance: 58 Ohm
Implantable Lead Implant Date: 20070615
Implantable Lead Implant Date: 20120813
Implantable Lead Implant Date: 20120813
Implantable Lead Location: 753858
Implantable Lead Location: 753859
Implantable Lead Location: 753860
Implantable Lead Model: 4396
Implantable Lead Model: 5076
Implantable Lead Model: 7121
Implantable Pulse Generator Implant Date: 20170329
Lead Channel Impedance Value: 228 Ohm
Lead Channel Impedance Value: 342 Ohm
Lead Channel Impedance Value: 342 Ohm
Lead Channel Impedance Value: 418 Ohm
Lead Channel Impedance Value: 513 Ohm
Lead Channel Impedance Value: 817 Ohm
Lead Channel Pacing Threshold Amplitude: 1.25 V
Lead Channel Pacing Threshold Pulse Width: 0.4 ms
Lead Channel Sensing Intrinsic Amplitude: 0.375 mV
Lead Channel Sensing Intrinsic Amplitude: 0.375 mV
Lead Channel Sensing Intrinsic Amplitude: 6.25 mV
Lead Channel Sensing Intrinsic Amplitude: 6.25 mV
Lead Channel Setting Pacing Amplitude: 1.75 V
Lead Channel Setting Pacing Amplitude: 2 V
Lead Channel Setting Pacing Amplitude: 2.5 V
Lead Channel Setting Pacing Pulse Width: 0.4 ms
Lead Channel Setting Pacing Pulse Width: 0.8 ms
Lead Channel Setting Sensing Sensitivity: 0.3 mV

## 2022-06-30 ENCOUNTER — Other Ambulatory Visit: Payer: Self-pay | Admitting: Urology

## 2022-06-30 DIAGNOSIS — N400 Enlarged prostate without lower urinary tract symptoms: Secondary | ICD-10-CM

## 2022-07-03 ENCOUNTER — Other Ambulatory Visit: Payer: Self-pay | Admitting: Internal Medicine

## 2022-07-03 DIAGNOSIS — I480 Paroxysmal atrial fibrillation: Secondary | ICD-10-CM

## 2022-07-03 NOTE — Telephone Encounter (Signed)
Prescription refill request for Eliquis received. Indication: Afib  Last office visit: 04/21/22 [name]) Scr: 0.8 (01/12/22) Age: 81 Weight: 83kg  Appropriate dose and refill sent to requested pharmacy.

## 2022-07-14 NOTE — Progress Notes (Signed)
Remote ICD transmission.   

## 2022-07-17 ENCOUNTER — Telehealth: Payer: Self-pay | Admitting: Internal Medicine

## 2022-07-17 ENCOUNTER — Encounter: Payer: Self-pay | Admitting: Internal Medicine

## 2022-07-17 NOTE — Telephone Encounter (Signed)
  1. Has your device fired? No- the alarm went off  2. Is you device beeping? no  3. Are you experiencing draining or swelling at device site? np  4. Are you calling to see if we received your device transmission? no  5. Have you passed out? no    Please route to [place]

## 2022-07-17 NOTE — Telephone Encounter (Signed)
Spoke to patient about device reaching RRT. Advised I will forward to scheduling to make apt with patient about gen change. Voiced understanding.

## 2022-07-17 NOTE — Telephone Encounter (Signed)
ICD has reached RRT 07/17/22. Attempted to contact patient to discuss. No answer, unable to leave VM d/t mailbox is full.

## 2022-07-20 ENCOUNTER — Ambulatory Visit (INDEPENDENT_AMBULATORY_CARE_PROVIDER_SITE_OTHER): Payer: Medicare Other

## 2022-07-20 DIAGNOSIS — Z95810 Presence of automatic (implantable) cardiac defibrillator: Secondary | ICD-10-CM

## 2022-07-20 DIAGNOSIS — I5022 Chronic systolic (congestive) heart failure: Secondary | ICD-10-CM

## 2022-07-20 NOTE — H&P (View-Only) (Signed)
Electrophysiology Office Note Date: 07/20/2022  ID:  [name]., DOB 09-Apr-1941, MRN [id number]  PCP: Derinda Late, MD Primary Cardiologist: Virl Axe, MD Electrophysiologist: Virl Axe, MD   CC: Routine ICD follow-up  [name]. is a 81 y.o. male seen today for Virl Axe, MD for routine electrophysiology followup.  Since last being seen in our clinic the patient reports doing very well from a cardiac perspective.  he denies chest pain, palpitations, dyspnea, PND, orthopnea, nausea, vomiting, dizziness, syncope, edema, weight gain, or early satiety. He has not had ICD shocks.   Device History: Medtronic Dual Chamber  Had 6949-lead. He reached ERI summer 2012 and underwent device explantation with new defibrillator lead insertion with left ventricular lead placement and generator replacement. He underwent device generator replacement again 3/17  Past Medical History:  Diagnosis Date   (HFpEF) heart failure with preserved ejection fraction ([place])    a.) TTE 10/30/2010: EF 30-35%; sev anterior wall and apical HK. b.) TTE 11/20/2014: EF 50-55%; no RWMAs. c.) TTE 07/06/2019: EF 50-55%; no RWMAs.   Allergic rhinitis    Anemia    Aortic atherosclerosis (HCC)    Arthritis    Asthma    Atrial fibrillation ([place])    a.) CHA2DS2-VASc = 8 (age x 2, CHF, HTN, TIA x 2, prior MI, T2DM). b.) rate/rhythm maintained with bisoprolol; chronically anticoagulated with apixaban.   Biventricular ICD (implantable cardioverter-defibrillator) in place 2007   a.) Medtronic device placed in 2007 with CRT upgrade in 07/2011. Generator changed 02/2016.   BPH (benign prostatic hyperplasia)    CAD (coronary artery disease) 01/14/1994   a.) cardiac arrest; ROSC achieved. LHC 01/14/1994 showed 95% pLAD stenosis. PCI performed --> 3.5 x 15 mm JJIS to pLAD.  b.) PCI performed in 2007 in [place], [place]; type and location of stent unknown. c.) LHC 2012 --> patent LAD stent;  nonobstructive disease.   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 ENDOSCOPY;  Service: Endoscopy;  Laterality: N/A;   CORONARY ANGIOPLASTY     LAD stent later additional stent placed 02/2006 branch of LAD   CYSTOSCOPY WITH INSERTION OF UROLIFT N/A 07/15/2020   Procedure: CYSTOSCOPY WITH INSERTION OF UROLIFT;  Surgeon: [name], MD;  Location: [facility] ORS;  Service: Urology;  Laterality: N/A;   CYSTOSCOPY WITH INSERTION OF UROLIFT N/A 11/03/2021   Procedure: CYSTOSCOPY WITH INSERTION OF UROLIFT;  Surgeon: [name], MD;  Location: [facility]  ORS;  Service: Urology;  Laterality: N/A;   CYSTOSCOPY WITH LITHOLAPAXY N/A 07/15/2020   Procedure: CYSTOSCOPY WITH LITHOLAPAXY;  Surgeon: [name], MD;  Location: [facility] ORS;  Service: Urology;  Laterality: N/A;   CYSTOSCOPY WITH LITHOLAPAXY N/A 11/03/2021   Procedure: CYSTOSCOPY WITH LITHOLAPAXY;  Surgeon: [name], MD;  Location: [facility] ORS;  Service: Urology;  Laterality: N/A;   EP IMPLANTABLE DEVICE N/A 03/18/2016   Procedure:  BiV ICD  Generator Changeout;  Surgeon: [name], MD;  Location: [place] CV LAB;  Service: Cardiovascular;  Laterality: N/A;   ESOPHAGOGASTRODUODENOSCOPY (EGD) WITH PROPOFOL N/A 04/01/2018   Procedure: ESOPHAGOGASTRODUODENOSCOPY (EGD) WITH PROPOFOL;  Surgeon: [name], MD;  Location: [facility] ENDOSCOPY;  Service: Endoscopy;  Laterality: N/A;   Implantation of Medtronic dual-chamber cardioverter N/A 2007   Left Knee Replacement  12/21/2004   PROSTATE BIOPSY     TONSILLECTOMY  12/21/1946   TOTAL KNEE REVISION Left 06/20/2015   Procedure: TOTAL KNEE REVISION;  Surgeon: [name], MD;  Location: [facility] ORS;  Service: Orthopedics;  Laterality: Left;    Current Outpatient Medications  Medication Sig Dispense Refill   acetaminophen (TYLENOL) 500 MG tablet Take 500 mg by mouth 2 (two) times daily.      albuterol (VENTOLIN HFA) 108 (90 Base) MCG/ACT inhaler Inhale 1-2 puffs into the lungs every 6 (six) hours as needed for wheezing or shortness of breath.     bisoprolol (ZEBETA) 5 MG tablet Take 1 tablet (5 mg total) by mouth daily. 30 tablet 6   Calcium Citrate-Vitamin D (CITRACAL + D PO) Take 2 tablets by mouth daily.      cycloSPORINE (RESTASIS) 0.05 % ophthalmic emulsion Place 1 drop into both eyes 2 (two) times daily.     dorzolamide (TRUSOPT) 2 % ophthalmic solution Place 1 drop into the right eye 2 (two) times daily.     dutasteride (AVODART) 0.5 MG capsule TAKE ONE CAPSULE BY MOUTH EVERY 3 DAYS 90 capsule 3   ELIQUIS 5 MG TABS tablet TAKE ONE TABLET TWICE DAILY 180 tablet 1   fluticasone (FLONASE) 50 MCG/ACT nasal spray Place 2 sprays into both nostrils daily.     furosemide (LASIX) 40 MG tablet Take 1.5 tablets (60 mg) by mouth once EVERY OTHER day 60 tablet 2   glipiZIDE (GLUCOTROL XL) 5 MG 24 hr tablet Take 5 mg by mouth daily.     ipratropium (ATROVENT) 0.06 % nasal spray Place 1 spray into both nostrils daily as needed for rhinitis.     latanoprost (XALATAN) 0.005 % ophthalmic  solution Place 1 drop into the right eye at bedtime.      losartan (COZAAR) 50 MG tablet Take 50 mg by mouth daily.     Melatonin 10 MG TABS Take 10 mg by mouth See admin instructions. Take 10 mg by mouth every other night     metFORMIN (GLUCOPHAGE-XR) 500 MG 24 hr tablet Take 500 mg by mouth in the morning and at bedtime.      montelukast (SINGULAIR) 10 MG tablet Take 10 mg by mouth daily.      Multiple Vitamin (MULTIVITAMIN) tablet Take 2 tablets by mouth daily.     omeprazole (PRILOSEC) 40 MG capsule Take 40 mg by mouth every morning.     Phenylephrine HCl (SINEX REGULAR NA) Place 2 sprays into both nostrils daily as needed (congestion).     rosuvastatin (CRESTOR) 40 MG tablet TAKE 1 TABLET BY MOUTH DAILY 90 tablet 0   zolpidem (AMBIEN) 5  MG tablet Take 5 mg by mouth See admin instructions. Take 5 mg by mouth every other night     No current facility-administered medications for this visit.    Allergies:   Beta adrenergic blockers, Oxycodone, and Sulfonamide derivatives   Social History: Social History   Socioeconomic History   Marital status: Married    Spouse name: Not on file   Number of children: 4   Years of education: Not on file   Highest education level: Not on file  Occupational History   Occupation: Retired Chief Financial Officer  Tobacco Use   Smoking status: Never   Smokeless tobacco: Never  Vaping Use   Vaping Use: Never used  Substance and Sexual Activity   Alcohol use: Yes    Alcohol/week: 7.0 standard drinks of alcohol    Types: 7 Shots of liquor per week    Comment: 1 GLASS LIQUOR PER DAY   Drug use: No   Sexual activity: Not on file  Other Topics Concern   Not on file  Social History Narrative   Regular exercise   Social Determinants of Health   Financial Resource Strain: Not on file  Food Insecurity: Not on file  Transportation Needs: Not on file  Physical Activity: Not on file  Stress: Not on file  Social Connections: Not on file  Intimate Partner  Violence: Not on file    Family History: Family History  Problem Relation Age of Onset   Emphysema Father    Lung cancer Father    Cirrhosis Mother    Hypertension Brother    Hypertension Son    Heart attack Neg Hx    Stroke Neg Hx     Review of Systems: All other systems reviewed and are otherwise negative except as noted above.   Physical Exam: There were no vitals filed for this visit.   GEN- The patient is well appearing, alert and oriented x 3 today.   HEENT: normocephalic, atraumatic; sclera clear, conjunctiva pink; hearing intact; oropharynx clear; neck supple, no JVP Lymph- no cervical lymphadenopathy Lungs- Clear to ausculation bilaterally, normal work of breathing.  No wheezes, rales, rhonchi Heart- Regular rate and rhythm, no murmurs, rubs or gallops, PMI not laterally displaced GI- soft, non-tender, non-distended, bowel sounds present, no hepatosplenomegaly Extremities- no clubbing or cyanosis. No edema; DP/PT/radial pulses 2+ bilaterally MS- no significant deformity or atrophy Skin- warm and dry, no rash or lesion; ICD pocket well healed Psych- euthymic mood, full affect Neuro- strength and sensation are intact  ICD interrogation- reviewed in detail today,  See PACEART report  EKG:  EKG is not ordered today.  Recent Labs: 11/03/2021: Hemoglobin 11.6; Platelets 176   Wt Readings from Last 3 Encounters:  04/21/22 183 lb (83 kg)  04/07/22 187 lb 9.6 oz (85.1 kg)  12/24/21 180 lb (81.6 kg)     Other studies Reviewed: Additional studies/ records that were reviewed today include: Previous EP office notes.   Assessment and Plan:  Ischemic Cardiomyopathy -- interval normalization prior LAD stenting   Complete heart block     Congestive heart failure acute/chronic/diastolic   Implantable defibrillator-CRT Medtronic    Atrial fibrillation paroxysmal   Bradycardia-sinus   Hypertension   Hyperlipidemia   Anemia     Device at ERI as of  07/17/2022  Volume status stable one exam. Continue lasix 40 mg with additional 20 mg as needed.   Device at [facility] as of 07/17/2022. Most recent echo 2020 showed normalization of EF.    He  has had numerous gen changes and has old leads and abandoned leads; Not sure downgrade is even a possible consideration. Can plan to turn therapies off in the future if needed.  Pt is NOT interested in a downgrade at this time.   Current medicines are reviewed at length with the patient today.    Labs/ tests ordered today include:  Orders Placed This Encounter  Procedures   Basic metabolic panel   CBC    Disposition:   Follow up with Dr. [name] in as usual post gen change   Signed, Shirley Friar, PA-C  07/20/2022 9:47 AM  [address] [place] [address] [phone] (office) [phone] (fax)

## 2022-07-20 NOTE — Telephone Encounter (Signed)
Pt has been scheduled with Oda Kilts, PA-C on 07/23/2022.

## 2022-07-20 NOTE — Progress Notes (Unsigned)
Electrophysiology Office Note Date: 07/20/2022  ID:  Ronnie Kutch., DOB 05-Mar-1941, MRN [id number]  PCP: Derinda Late, MD Primary Cardiologist: Virl Axe, MD Electrophysiologist: Virl Axe, MD   CC: Routine ICD follow-up  Ronnie Chimes. is a 81 y.o. male seen today for Virl Axe, MD for routine electrophysiology followup.  Since last being seen in our clinic the patient reports doing very well from a cardiac perspective.  he denies chest pain, palpitations, dyspnea, PND, orthopnea, nausea, vomiting, dizziness, syncope, edema, weight gain, or early satiety. He has not had ICD shocks.   Device History: Medtronic Dual Chamber  Had 6949-lead. He reached ERI summer 2012 and underwent device explantation with new defibrillator lead insertion with left ventricular lead placement and generator replacement. He underwent device generator replacement again 3/17  Past Medical History:  Diagnosis Date   (HFpEF) heart failure with preserved ejection fraction ([place])    a.) TTE 10/30/2010: EF 30-35%; sev anterior wall and apical HK. b.) TTE 11/20/2014: EF 50-55%; no RWMAs. c.) TTE 07/06/2019: EF 50-55%; no RWMAs.   Allergic rhinitis    Anemia    Aortic atherosclerosis (HCC)    Arthritis    Asthma    Atrial fibrillation ([place])    a.) CHA2DS2-VASc = 8 (age x 2, CHF, HTN, TIA x 2, prior MI, T2DM). b.) rate/rhythm maintained with bisoprolol; chronically anticoagulated with apixaban.   Biventricular ICD (implantable cardioverter-defibrillator) in place 2007   a.) Medtronic device placed in 2007 with CRT upgrade in 07/2011. Generator changed 02/2016.   BPH (benign prostatic hyperplasia)    CAD (coronary artery disease) 01/14/1994   a.) cardiac arrest; ROSC achieved. LHC 01/14/1994 showed 95% pLAD stenosis. PCI performed --> 3.5 x 15 mm JJIS to pLAD.  b.) PCI performed in 2007 in [place], [place]; type and location of stent unknown. c.) LHC 2012 --> patent LAD stent;  nonobstructive disease.   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)    Myocardial infarction ([place])    Sciatic nerve pain    Severe sinus bradycardia    a.) s/p Medtronic BiV ICD placement   Syncope    T2DM (type 2 diabetes mellitus) ([place])    TIA (transient ischemic attack) 10/2010   Past Surgical History:  Procedure Laterality Date   CATARACT EXTRACTION, BILATERAL     COLONOSCOPY WITH PROPOFOL N/A 04/01/2018   Procedure: COLONOSCOPY WITH PROPOFOL;  Surgeon: [name], MD;  Location: [facility] ENDOSCOPY;  Service: Endoscopy;  Laterality: N/A;   CORONARY ANGIOPLASTY     LAD stent later additional stent placed 02/2006 branch of LAD   CYSTOSCOPY WITH INSERTION OF UROLIFT N/A 07/15/2020   Procedure: CYSTOSCOPY WITH INSERTION OF UROLIFT;  Surgeon: [name], MD;  Location: [facility] ORS;  Service: Urology;  Laterality: N/A;   CYSTOSCOPY WITH INSERTION OF UROLIFT N/A 11/03/2021   Procedure: CYSTOSCOPY WITH INSERTION OF UROLIFT;  Surgeon: [name], MD;  Location: [facility]  ORS;  Service: Urology;  Laterality: N/A;   CYSTOSCOPY WITH LITHOLAPAXY N/A 07/15/2020   Procedure: CYSTOSCOPY WITH LITHOLAPAXY;  Surgeon: [name], MD;  Location: [facility] ORS;  Service: Urology;  Laterality: N/A;   CYSTOSCOPY WITH LITHOLAPAXY N/A 11/03/2021   Procedure: CYSTOSCOPY WITH LITHOLAPAXY;  Surgeon: [name], MD;  Location: [facility] ORS;  Service: Urology;  Laterality: N/A;   EP IMPLANTABLE DEVICE N/A 03/18/2016   Procedure:  BiV ICD  Generator Changeout;  Surgeon: [name], MD;  Location: [place] CV LAB;  Service: Cardiovascular;  Laterality: N/A;   ESOPHAGOGASTRODUODENOSCOPY (EGD) WITH PROPOFOL N/A 04/01/2018   Procedure: ESOPHAGOGASTRODUODENOSCOPY (EGD) WITH PROPOFOL;  Surgeon: [name], MD;  Location: [facility] ENDOSCOPY;  Service: Endoscopy;  Laterality: N/A;   Implantation of Medtronic dual-chamber cardioverter N/A 2007   Left Knee Replacement  12/21/2004   PROSTATE BIOPSY     TONSILLECTOMY  12/21/1946   TOTAL KNEE REVISION Left 06/20/2015   Procedure: TOTAL KNEE REVISION;  Surgeon: [name], MD;  Location: [facility] ORS;  Service: Orthopedics;  Laterality: Left;    Current Outpatient Medications  Medication Sig Dispense Refill   acetaminophen (TYLENOL) 500 MG tablet Take 500 mg by mouth 2 (two) times daily.      albuterol (VENTOLIN HFA) 108 (90 Base) MCG/ACT inhaler Inhale 1-2 puffs into the lungs every 6 (six) hours as needed for wheezing or shortness of breath.     bisoprolol (ZEBETA) 5 MG tablet Take 1 tablet (5 mg total) by mouth daily. 30 tablet 6   Calcium Citrate-Vitamin D (CITRACAL + D PO) Take 2 tablets by mouth daily.      cycloSPORINE (RESTASIS) 0.05 % ophthalmic emulsion Place 1 drop into both eyes 2 (two) times daily.     dorzolamide (TRUSOPT) 2 % ophthalmic solution Place 1 drop into the right eye 2 (two) times daily.     dutasteride (AVODART) 0.5 MG capsule TAKE ONE CAPSULE BY MOUTH EVERY 3 DAYS 90 capsule 3   ELIQUIS 5 MG TABS tablet TAKE ONE TABLET TWICE DAILY 180 tablet 1   fluticasone (FLONASE) 50 MCG/ACT nasal spray Place 2 sprays into both nostrils daily.     furosemide (LASIX) 40 MG tablet Take 1.5 tablets (60 mg) by mouth once EVERY OTHER day 60 tablet 2   glipiZIDE (GLUCOTROL XL) 5 MG 24 hr tablet Take 5 mg by mouth daily.     ipratropium (ATROVENT) 0.06 % nasal spray Place 1 spray into both nostrils daily as needed for rhinitis.     latanoprost (XALATAN) 0.005 % ophthalmic  solution Place 1 drop into the right eye at bedtime.      losartan (COZAAR) 50 MG tablet Take 50 mg by mouth daily.     Melatonin 10 MG TABS Take 10 mg by mouth See admin instructions. Take 10 mg by mouth every other night     metFORMIN (GLUCOPHAGE-XR) 500 MG 24 hr tablet Take 500 mg by mouth in the morning and at bedtime.      montelukast (SINGULAIR) 10 MG tablet Take 10 mg by mouth daily.      Multiple Vitamin (MULTIVITAMIN) tablet Take 2 tablets by mouth daily.     omeprazole (PRILOSEC) 40 MG capsule Take 40 mg by mouth every morning.     Phenylephrine HCl (SINEX REGULAR NA) Place 2 sprays into both nostrils daily as needed (congestion).     rosuvastatin (CRESTOR) 40 MG tablet TAKE 1 TABLET BY MOUTH DAILY 90 tablet 0   zolpidem (AMBIEN) 5  MG tablet Take 5 mg by mouth See admin instructions. Take 5 mg by mouth every other night     No current facility-administered medications for this visit.    Allergies:   Beta adrenergic blockers, Oxycodone, and Sulfonamide derivatives   Social History: Social History   Socioeconomic History   Marital status: Married    Spouse name: Not on file   Number of children: 4   Years of education: Not on file   Highest education level: Not on file  Occupational History   Occupation: Retired Chief Financial Officer  Tobacco Use   Smoking status: Never   Smokeless tobacco: Never  Vaping Use   Vaping Use: Never used  Substance and Sexual Activity   Alcohol use: Yes    Alcohol/week: 7.0 standard drinks of alcohol    Types: 7 Shots of liquor per week    Comment: 1 GLASS LIQUOR PER DAY   Drug use: No   Sexual activity: Not on file  Other Topics Concern   Not on file  Social History Narrative   Regular exercise   Social Determinants of Health   Financial Resource Strain: Not on file  Food Insecurity: Not on file  Transportation Needs: Not on file  Physical Activity: Not on file  Stress: Not on file  Social Connections: Not on file  Intimate Partner  Violence: Not on file    Family History: Family History  Problem Relation Age of Onset   Emphysema Father    Lung cancer Father    Cirrhosis Mother    Hypertension Brother    Hypertension Son    Heart attack Neg Hx    Stroke Neg Hx     Review of Systems: All other systems reviewed and are otherwise negative except as noted above.   Physical Exam: There were no vitals filed for this visit.   GEN- The patient is well appearing, alert and oriented x 3 today.   HEENT: normocephalic, atraumatic; sclera clear, conjunctiva pink; hearing intact; oropharynx clear; neck supple, no JVP Lymph- no cervical lymphadenopathy Lungs- Clear to ausculation bilaterally, normal work of breathing.  No wheezes, rales, rhonchi Heart- Regular rate and rhythm, no murmurs, rubs or gallops, PMI not laterally displaced GI- soft, non-tender, non-distended, bowel sounds present, no hepatosplenomegaly Extremities- no clubbing or cyanosis. No edema; DP/PT/radial pulses 2+ bilaterally MS- no significant deformity or atrophy Skin- warm and dry, no rash or lesion; ICD pocket well healed Psych- euthymic mood, full affect Neuro- strength and sensation are intact  ICD interrogation- reviewed in detail today,  See PACEART report  EKG:  EKG is not ordered today.  Recent Labs: 11/03/2021: Hemoglobin 11.6; Platelets 176   Wt Readings from Last 3 Encounters:  04/21/22 183 lb (83 kg)  04/07/22 187 lb 9.6 oz (85.1 kg)  12/24/21 180 lb (81.6 kg)     Other studies Reviewed: Additional studies/ records that were reviewed today include: Previous EP office notes.   Assessment and Plan:  Ischemic Cardiomyopathy -- interval normalization prior LAD stenting   Complete heart block     Congestive heart failure acute/chronic/diastolic   Implantable defibrillator-CRT Medtronic    Atrial fibrillation paroxysmal   Bradycardia-sinus   Hypertension   Hyperlipidemia   Anemia     Device at ERI as of  07/17/2022  Volume status stable one exam. Continue lasix 40 mg with additional 20 mg as needed.   Device at [facility] as of 07/17/2022. Most recent echo 2020 showed normalization of EF.    He  has had numerous gen changes and has old leads and abandoned leads; Not sure downgrade is even a possible consideration. Can plan to turn therapies off in the future if needed.  Pt is NOT interested in a downgrade at this time.   Current medicines are reviewed at length with the patient today.    Labs/ tests ordered today include:  Orders Placed This Encounter  Procedures   Basic metabolic panel   CBC    Disposition:   Follow up with Dr. [name] in as usual post gen change   Signed, Shirley Friar, PA-C  07/20/2022 9:47 AM  [place] [address] [phone] (office) [phone] (fax)

## 2022-07-23 ENCOUNTER — Telehealth: Payer: Self-pay

## 2022-07-23 ENCOUNTER — Encounter: Payer: Self-pay | Admitting: Student

## 2022-07-23 ENCOUNTER — Ambulatory Visit (INDEPENDENT_AMBULATORY_CARE_PROVIDER_SITE_OTHER): Payer: Medicare Other | Admitting: Student

## 2022-07-23 VITALS — BP 110/68 | HR 62 | Ht 66.0 in | Wt 185.8 lb

## 2022-07-23 DIAGNOSIS — Z95810 Presence of automatic (implantable) cardiac defibrillator: Secondary | ICD-10-CM | POA: Diagnosis not present

## 2022-07-23 DIAGNOSIS — I255 Ischemic cardiomyopathy: Secondary | ICD-10-CM | POA: Diagnosis not present

## 2022-07-23 DIAGNOSIS — I5022 Chronic systolic (congestive) heart failure: Secondary | ICD-10-CM | POA: Diagnosis not present

## 2022-07-23 DIAGNOSIS — I480 Paroxysmal atrial fibrillation: Secondary | ICD-10-CM

## 2022-07-23 DIAGNOSIS — I442 Atrioventricular block, complete: Secondary | ICD-10-CM

## 2022-07-23 DIAGNOSIS — I2510 Atherosclerotic heart disease of native coronary artery without angina pectoris: Secondary | ICD-10-CM

## 2022-07-23 LAB — CUP PACEART INCLINIC DEVICE CHECK
Battery Remaining Longevity: 1 mo
Battery Voltage: 2.71 V
Brady Statistic AP VP Percent: 86.15 %
Brady Statistic AP VS Percent: 0.02 %
Brady Statistic AS VP Percent: 13.7 %
Brady Statistic AS VS Percent: 0.13 %
Brady Statistic RA Percent Paced: 85.44 %
Brady Statistic RV Percent Paced: 99.44 %
Date Time Interrogation Session: 20230803092225
HighPow Impedance: 48 Ohm
HighPow Impedance: 61 Ohm
Implantable Lead Implant Date: 20070615
Implantable Lead Implant Date: 20120813
Implantable Lead Implant Date: 20120813
Implantable Lead Location: 753858
Implantable Lead Location: 753859
Implantable Lead Location: 753860
Implantable Lead Model: 4396
Implantable Lead Model: 5076
Implantable Lead Model: 7121
Implantable Pulse Generator Implant Date: 20170329
Lead Channel Impedance Value: 228 Ohm
Lead Channel Impedance Value: 342 Ohm
Lead Channel Impedance Value: 361 Ohm
Lead Channel Impedance Value: 456 Ohm
Lead Channel Impedance Value: 532 Ohm
Lead Channel Impedance Value: 893 Ohm
Lead Channel Pacing Threshold Amplitude: 1.25 V
Lead Channel Pacing Threshold Pulse Width: 0.4 ms
Lead Channel Sensing Intrinsic Amplitude: 0.25 mV
Lead Channel Sensing Intrinsic Amplitude: 0.25 mV
Lead Channel Sensing Intrinsic Amplitude: 6.25 mV
Lead Channel Sensing Intrinsic Amplitude: 6.25 mV
Lead Channel Setting Pacing Amplitude: 1.75 V
Lead Channel Setting Pacing Amplitude: 2 V
Lead Channel Setting Pacing Amplitude: 2.5 V
Lead Channel Setting Pacing Pulse Width: 0.4 ms
Lead Channel Setting Pacing Pulse Width: 0.8 ms
Lead Channel Setting Sensing Sensitivity: 0.3 mV

## 2022-07-23 LAB — BASIC METABOLIC PANEL
BUN/Creatinine Ratio: 23 (ref 10–24)
BUN: 19 mg/dL (ref 8–27)
CO2: 25 mmol/L (ref 20–29)
Calcium: 9.9 mg/dL (ref 8.6–10.2)
Chloride: 105 mmol/L (ref 96–106)
Creatinine, Ser: 0.82 mg/dL (ref 0.76–1.27)
Glucose: 125 mg/dL — ABNORMAL HIGH (ref 70–99)
Potassium: 4.6 mmol/L (ref 3.5–5.2)
Sodium: 142 mmol/L (ref 134–144)
eGFR: 89 mL/min/{1.73_m2} (ref >59)

## 2022-07-23 LAB — CBC
Hematocrit: 35.2 % — ABNORMAL LOW (ref 37.5–51.0)
Hemoglobin: 11.7 g/dL — ABNORMAL LOW (ref 13.0–17.7)
MCH: 26.8 pg (ref 26.6–33.0)
MCHC: 33.2 g/dL (ref 31.5–35.7)
MCV: 81 fL (ref 79–97)
Platelets: 157 10*3/uL (ref 150–450)
RBC: 4.37 x10E6/uL (ref 4.14–5.80)
RDW: 15.2 % (ref 11.6–15.4)
WBC: 5.5 10*3/uL (ref 3.4–10.8)

## 2022-07-23 NOTE — Telephone Encounter (Signed)
Went over instructions with Pt and Wife. He was advised to hold Eliquis for 2 days prior to procedure. Instructions for this was written on his sheet and he understood. Gen Change is scheduled for 8/16, last dose will be on 8/13.

## 2022-07-23 NOTE — Progress Notes (Signed)
EPIC Encounter for ICM Monitoring  Patient Name: [name]. is a 81 y.o. male Date: 07/23/2022 Primary Care Physican: Derinda Late, MD Primary Cardiologist: [name] Electrophysiologist: Vergie Living Pacing: 99.8%         01/21/2022 Weight: 180 lbs 05/08/2022 Weight: 180 lbs 06/07/2022 Weight: 183.5 lbs 06/09/2022 Weight: 180.5 lbs 06/16/2022 Weight: 178.5 lbs   Time in AT/AF  0.0 hr/day (0.0%)                                                           Attempted call to patient and unable to reach.  Left detailed message per DPR regarding transmission. Transmission reviewed.  Battery replacement 8/16.  Optivol thoracic impedance suggesting normal fluid levels.   Prescribed:  Furosemide 40 mg Take 1.5 tablets (60 mg total) by mouth every OTHER day.     Labs: 07/14/2022 Creatinine 0.8, BUN 21, Potassium 4.3, Sodium 140, GFr 93 A complete set of results can be found in Results Review.   Recommendations:  Left voice mail with ICM number and encouraged to call if experiencing any fluid symptoms.   Follow-up plan: ICM clinic phone appointment on 09/14/2022.   91 day device clinic remote transmission pending post battery replacement.     EP/Cardiology Office Visits:  Recall 10/04/2022  with Dr. [name].     Copy of ICM check sent to Dr. [name].    3 month ICM trend: 07/20/2022.    12-14 Month ICM trend:     Rosalene Billings, RN 07/23/2022 12:49 PM

## 2022-07-23 NOTE — Patient Instructions (Signed)
Medication Instructions:  Your physician recommends that you continue on your current medications as directed. Please refer to the Current Medication list given to you today.  *If you need a refill on your cardiac medications before your next appointment, please call your pharmacy*   Lab Work: TODAY: BMET, CBC  If you have labs (blood work) drawn today and your tests are completely normal, you will receive your results only by: [place] (if you have MyChart) OR A paper copy in the mail If you have any lab test that is abnormal or we need to change your treatment, we will call you to review the results.   Follow-Up: At [facility], you and your health needs are our priority.  As part of our continuing mission to provide you with exceptional heart care, we have created designated Provider Care Teams.  These Care Teams include your primary Cardiologist (physician) and Advanced Practice Providers (APPs -  Physician Assistants and Nurse Practitioners) who all work together to provide you with the care you need, when you need it.   Your next appointment:   We will call you to schedule   Other Instructions See letter for procedure instructions

## 2022-07-23 NOTE — Telephone Encounter (Signed)
Remote ICM transmission received.  Attempted call to patient regarding ICM remote transmission and left detailed message per DPR.  Advised to return call for any fluid symptoms or questions. Next ICM remote transmission scheduled 09/14/2022.

## 2022-07-27 ENCOUNTER — Ambulatory Visit (INDEPENDENT_AMBULATORY_CARE_PROVIDER_SITE_OTHER): Payer: Medicare Other

## 2022-07-27 DIAGNOSIS — I255 Ischemic cardiomyopathy: Secondary | ICD-10-CM

## 2022-07-28 ENCOUNTER — Telehealth: Payer: Self-pay

## 2022-07-28 LAB — CUP PACEART REMOTE DEVICE CHECK
Battery Remaining Longevity: 1 mo
Battery Voltage: 2.71 V
Brady Statistic AP VP Percent: 84.44 %
Brady Statistic AP VS Percent: 0.01 %
Brady Statistic AS VP Percent: 15.48 %
Brady Statistic AS VS Percent: 0.08 %
Brady Statistic RA Percent Paced: 84.23 %
Brady Statistic RV Percent Paced: 99.75 %
Date Time Interrogation Session: 20230808105015
HighPow Impedance: 51 Ohm
HighPow Impedance: 65 Ohm
Implantable Lead Implant Date: 20070615
Implantable Lead Implant Date: 20120813
Implantable Lead Implant Date: 20120813
Implantable Lead Location: 753858
Implantable Lead Location: 753859
Implantable Lead Location: 753860
Implantable Lead Model: 4396
Implantable Lead Model: 5076
Implantable Lead Model: 7121
Implantable Pulse Generator Implant Date: 20170329
Lead Channel Impedance Value: 228 Ohm
Lead Channel Impedance Value: 304 Ohm
Lead Channel Impedance Value: 361 Ohm
Lead Channel Impedance Value: 456 Ohm
Lead Channel Impedance Value: 532 Ohm
Lead Channel Impedance Value: 836 Ohm
Lead Channel Pacing Threshold Amplitude: 1.25 V
Lead Channel Pacing Threshold Pulse Width: 0.4 ms
Lead Channel Sensing Intrinsic Amplitude: 0.25 mV
Lead Channel Sensing Intrinsic Amplitude: 0.25 mV
Lead Channel Sensing Intrinsic Amplitude: 6.25 mV
Lead Channel Sensing Intrinsic Amplitude: 6.25 mV
Lead Channel Setting Pacing Amplitude: 1.75 V
Lead Channel Setting Pacing Amplitude: 2 V
Lead Channel Setting Pacing Amplitude: 2.5 V
Lead Channel Setting Pacing Pulse Width: 0.4 ms
Lead Channel Setting Pacing Pulse Width: 0.8 ms
Lead Channel Setting Sensing Sensitivity: 0.3 mV

## 2022-07-28 NOTE — Telephone Encounter (Signed)
I spoke with the patient and he has sent missed ICM transmission.07/28/2022.

## 2022-08-05 ENCOUNTER — Ambulatory Visit (HOSPITAL_COMMUNITY)
Admission: RE | Admit: 2022-08-05 | Discharge: 2022-08-05 | Disposition: A | Discharge status: Home / Self Care | Payer: Medicare Other | Attending: Internal Medicine | Admitting: Internal Medicine

## 2022-08-05 ENCOUNTER — Encounter (HOSPITAL_COMMUNITY)
Admission: RE | Disposition: A | Discharge status: Home / Self Care | Payer: Self-pay | Source: Home / Self Care | Attending: Internal Medicine

## 2022-08-05 DIAGNOSIS — I442 Atrioventricular block, complete: Secondary | ICD-10-CM | POA: Diagnosis not present

## 2022-08-05 DIAGNOSIS — D649 Anemia, unspecified: Secondary | ICD-10-CM | POA: Diagnosis not present

## 2022-08-05 DIAGNOSIS — Z95810 Presence of automatic (implantable) cardiac defibrillator: Secondary | ICD-10-CM

## 2022-08-05 DIAGNOSIS — Z79899 Other long term (current) drug therapy: Secondary | ICD-10-CM | POA: Insufficient documentation

## 2022-08-05 DIAGNOSIS — I480 Paroxysmal atrial fibrillation: Secondary | ICD-10-CM | POA: Insufficient documentation

## 2022-08-05 DIAGNOSIS — I110 Hypertensive heart disease with heart failure: Secondary | ICD-10-CM | POA: Insufficient documentation

## 2022-08-05 DIAGNOSIS — I255 Ischemic cardiomyopathy: Secondary | ICD-10-CM

## 2022-08-05 DIAGNOSIS — Z955 Presence of coronary angioplasty implant and graft: Secondary | ICD-10-CM | POA: Insufficient documentation

## 2022-08-05 DIAGNOSIS — E785 Hyperlipidemia, unspecified: Secondary | ICD-10-CM | POA: Diagnosis not present

## 2022-08-05 DIAGNOSIS — I5033 Acute on chronic diastolic (congestive) heart failure: Secondary | ICD-10-CM | POA: Diagnosis not present

## 2022-08-05 DIAGNOSIS — Z4502 Encounter for adjustment and management of automatic implantable cardiac defibrillator: Secondary | ICD-10-CM | POA: Diagnosis not present

## 2022-08-05 DIAGNOSIS — I5022 Chronic systolic (congestive) heart failure: Secondary | ICD-10-CM

## 2022-08-05 DIAGNOSIS — I429 Cardiomyopathy, unspecified: Secondary | ICD-10-CM | POA: Diagnosis not present

## 2022-08-05 HISTORY — PX: ICD GENERATOR CHANGEOUT: EP1231

## 2022-08-05 LAB — GLUCOSE, CAPILLARY: Glucose-Capillary: 118 mg/dL — ABNORMAL HIGH (ref 70–99)

## 2022-08-05 SURGERY — ICD GENERATOR CHANGEOUT

## 2022-08-05 MED ORDER — FENTANYL CITRATE (PF) 100 MCG/2ML IJ SOLN
INTRAMUSCULAR | Status: AC
  Filled 2022-08-05: qty 2

## 2022-08-05 MED ORDER — SODIUM CHLORIDE 0.9 % IV SOLN
INTRAVENOUS | Status: AC
  Filled 2022-08-05: qty 2

## 2022-08-05 MED ORDER — LIDOCAINE HCL (PF) 1 % IJ SOLN
INTRAMUSCULAR | Status: DC | PRN
  Administered 2022-08-05: 45 mL

## 2022-08-05 MED ORDER — CHLORHEXIDINE GLUCONATE 4 % EX LIQD
4.0000 | Freq: Once | CUTANEOUS | Status: DC
  Filled 2022-08-05: qty 60

## 2022-08-05 MED ORDER — SODIUM CHLORIDE 0.9 % IV SOLN: INTRAVENOUS | Status: DC

## 2022-08-05 MED ORDER — CEFAZOLIN SODIUM-DEXTROSE 2-4 GM/100ML-% IV SOLN
2.0000 g | INTRAVENOUS | Status: AC
  Administered 2022-08-05: 2 g via INTRAVENOUS

## 2022-08-05 MED ORDER — MIDAZOLAM HCL 5 MG/5ML IJ SOLN
INTRAMUSCULAR | Status: AC
  Filled 2022-08-05: qty 5

## 2022-08-05 MED ORDER — SODIUM CHLORIDE 0.9 % IV SOLN: INTRAVENOUS | Status: AC

## 2022-08-05 MED ORDER — FENTANYL CITRATE (PF) 100 MCG/2ML IJ SOLN
INTRAMUSCULAR | Status: DC | PRN
  Administered 2022-08-05 (×2): 25 ug via INTRAVENOUS

## 2022-08-05 MED ORDER — SODIUM CHLORIDE 0.9 % IV SOLN
80.0000 mg | INTRAVENOUS | Status: AC
  Administered 2022-08-05: 80 mg

## 2022-08-05 MED ORDER — MIDAZOLAM HCL 5 MG/5ML IJ SOLN
INTRAMUSCULAR | Status: DC | PRN
  Administered 2022-08-05 (×2): 1 mg via INTRAVENOUS

## 2022-08-05 MED ORDER — CEFAZOLIN SODIUM-DEXTROSE 2-4 GM/100ML-% IV SOLN
INTRAVENOUS | Status: AC
  Filled 2022-08-05: qty 100

## 2022-08-05 MED ORDER — ACETAMINOPHEN 325 MG PO TABS: 325.0000 mg | ORAL_TABLET | ORAL | Status: DC | PRN

## 2022-08-05 SURGICAL SUPPLY — 10 items
CABLE SURGICAL S-101-97-12 (CABLE) ×3 IMPLANT
DEVICE DISSECT PLASMABLAD 3.0S (MISCELLANEOUS) IMPLANT
HEMOSTAT SURGICEL 2X4 FIBR (HEMOSTASIS) ×1 IMPLANT
ICD CLARIA MRI DTMA1D1 (ICD Generator) ×1 IMPLANT
ICD CLARIA MRI DTMA1Q1 (ICD Generator) ×1 IMPLANT
PAD DEFIB RADIO PHYSIO CONN (PAD) ×3 IMPLANT
PLASMABLADE 3.0S (MISCELLANEOUS) ×2
POUCH AIGIS-R ANTIBACT ICD (Mesh General) ×2 IMPLANT
POUCH AIGIS-R ANTIBACT ICD LRG (Mesh General) IMPLANT
TRAY PACEMAKER INSERTION (PACKS) ×3 IMPLANT

## 2022-08-05 NOTE — Interval H&P Note (Signed)
History and Physical Interval Note:  08/05/2022 7:28 AM  [name].  has presented today for surgery, with the diagnosis of ERI.  The various methods of treatment have been discussed with the patient and family. After consideration of risks, benefits and other options for treatment, the patient has consented to  Procedure(s): ICD [place] (N/A) as a surgical intervention.  The patient's history has been reviewed, patient examined, no change in status, stable for surgery.  I have reviewed the patient's chart and labs.  Questions were answered to the patient's satisfaction.     Virl Axe

## 2022-08-06 ENCOUNTER — Encounter (HOSPITAL_COMMUNITY): Payer: Self-pay | Admitting: Internal Medicine

## 2022-08-08 ENCOUNTER — Encounter: Payer: Self-pay | Admitting: Internal Medicine

## 2022-08-08 NOTE — Progress Notes (Unsigned)
Called to followup after implant  Some diaphragmatic stimulation noted at implant and some complaints noted positionally at home This had not been an issue with the old device  We will follow

## 2022-08-10 ENCOUNTER — Encounter: Payer: Self-pay | Admitting: Internal Medicine

## 2022-08-19 ENCOUNTER — Ambulatory Visit: Payer: Medicare Other

## 2022-08-20 ENCOUNTER — Ambulatory Visit: Payer: Medicare Other | Attending: Internal Medicine

## 2022-08-20 DIAGNOSIS — I255 Ischemic cardiomyopathy: Secondary | ICD-10-CM

## 2022-08-20 LAB — CUP PACEART INCLINIC DEVICE CHECK
Battery Remaining Longevity: 68 mo
Battery Voltage: 3.08 V
Brady Statistic AP VP Percent: 85.33 %
Brady Statistic AP VS Percent: 0.01 %
Brady Statistic AS VP Percent: 14.56 %
Brady Statistic AS VS Percent: 0.11 %
Brady Statistic RA Percent Paced: 84.85 %
Brady Statistic RV Percent Paced: 99.59 %
Date Time Interrogation Session: 20230831141800
HighPow Impedance: 48 Ohm
HighPow Impedance: 62 Ohm
Implantable Lead Implant Date: 20070615
Implantable Lead Implant Date: 20120813
Implantable Lead Implant Date: 20120813
Implantable Lead Location: 753858
Implantable Lead Location: 753859
Implantable Lead Location: 753860
Implantable Lead Model: 4396
Implantable Lead Model: 5076
Implantable Lead Model: 7121
Implantable Pulse Generator Implant Date: 20230816
Lead Channel Impedance Value: 209 Ohm
Lead Channel Impedance Value: 323 Ohm
Lead Channel Impedance Value: 323 Ohm
Lead Channel Impedance Value: 456 Ohm
Lead Channel Impedance Value: 570 Ohm
Lead Channel Impedance Value: 912 Ohm
Lead Channel Pacing Threshold Amplitude: 1.25 V
Lead Channel Pacing Threshold Pulse Width: 0.4 ms
Lead Channel Sensing Intrinsic Amplitude: 0.25 mV
Lead Channel Sensing Intrinsic Amplitude: 0.25 mV
Lead Channel Setting Pacing Amplitude: 1.5 V
Lead Channel Setting Pacing Amplitude: 2 V
Lead Channel Setting Pacing Amplitude: 2.75 V
Lead Channel Setting Pacing Pulse Width: 0.4 ms
Lead Channel Setting Pacing Pulse Width: 1.5 ms
Lead Channel Setting Sensing Sensitivity: 0.3 mV

## 2022-08-20 NOTE — Patient Instructions (Signed)
   After Your Pacemaker   Monitor your pacemaker site for redness, swelling, and drainage. Call the device clinic at [phone] if you experience these symptoms or fever/chills.  Your incision was closed with Dermabond:  You may shower 1 day after your defibrillator implant and wash your incision with soap and water. Avoid lotions, ointments, or perfumes over your incision until it is well-healed.  You may use a hot tub or a pool after your wound check appointment if the incision is completely closed.  There are no restrictions in arm movement after your wound check appointment.  You may drive, unless driving has been restricted by your healthcare providers.  Remote monitoring is used to monitor your pacemaker from home. This monitoring is scheduled every 91 days by our office. It allows [place] to keep an eye on the functioning of your device to ensure it is working properly. You will routinely see your Electrophysiologist annually (more often if necessary).

## 2022-08-20 NOTE — Progress Notes (Signed)
Wound Check/CRT-D device check in office. Dermabond removed, wound edges well approximated and healed WNL no signs of redness, pain, swelling or infection.  Patient complained of exercise fatigue which was new since device change.  Settings reviewed from recent transmission prior to gen change.  The following adjustments were made to align with prior settings to improve patients exercise tolerance:  1. Upper Sensor Rate changed from 120bpm back to 130bpm 2. Rate Profile Optimization was turned from ON to OFF  3. ADL Setpoint adjusted from 14 back to 5 4.  UR setpoint adjusted from 26 to 28 5. Activity Threshold changed from medium low to low 6. Activity Deceleration changed from exercise to 2.11mn  Thresholds and sensing consistent with previous device measurements. Lead impedance trends stable over time. No mode switch episodes recorded. No ventricular arrhythmia episodes recorded. Patient bi-ventricularly pacing 99.6% of the time. Device programmed with appropriate safety margins. Heart failure diagnostics reviewed and trends are stable for patient. Audible/vibratory alerts demonstrated for patient. No changes made this session. Estimated longevity 5 years.  Patient enrolled in remote follow up. Plan to check device remotely in 3 months and see in office in 6 months. Patient education completed including shock plan.

## 2022-08-27 NOTE — Addendum Note (Signed)
Addended by: Douglass Rivers D on: 08/27/2022 02:51 PM   Modules accepted: Level of Service

## 2022-08-27 NOTE — Progress Notes (Signed)
Remote ICD transmission.   

## 2022-09-14 ENCOUNTER — Ambulatory Visit (INDEPENDENT_AMBULATORY_CARE_PROVIDER_SITE_OTHER): Payer: Medicare Other

## 2022-09-14 DIAGNOSIS — I5022 Chronic systolic (congestive) heart failure: Secondary | ICD-10-CM

## 2022-09-14 DIAGNOSIS — Z95810 Presence of automatic (implantable) cardiac defibrillator: Secondary | ICD-10-CM

## 2022-09-17 NOTE — Progress Notes (Signed)
EPIC Encounter for ICM Monitoring  Patient Name: [name]. is a 81 y.o. male Date: 09/17/2022 Primary Care Physican: Derinda Late, MD Primary Cardiologist: [name] Electrophysiologist: Vergie Living Pacing: 99.8%         01/21/2022 Weight: 180 lbs 05/08/2022 Weight: 180 lbs 06/07/2022 Weight: 183.5 lbs 06/09/2022 Weight: 180.5 lbs 06/16/2022 Weight: 178.5 lbs 09/17/2022 Weight: 176 lbs   Time in AT/AF  0.0 hr/day (0.0%)                                                            Spoke with patient and heart failure questions reviewed.  Transmission results reviewed.  Pt asymptomatic for fluid accumulation.  Reports feeling well at this time and voices no complaints.     Optivol thoracic impedance suggesting normal fluid levels.   Prescribed:  Furosemide 40 mg Take 1.5 tablets (60 mg total) by mouth every OTHER day.     Labs: 07/14/2022 Creatinine 0.8, BUN 21, Potassium 4.3, Sodium 140, GFr 93 A complete set of results can be found in Results Review.   Recommendations:  No changes and encouraged to call if experiencing any fluid symptoms.   Follow-up plan: ICM clinic phone appointment on 10/19/2022.   91 day device clinic remote transmission 11/05/2022.     EP/Cardiology Office Visits: 11/17/2022 with Dr. [name].     Copy of ICM check sent to Dr. [name].  3 month ICM trend: 09/14/2022.    12-14 Month ICM trend:     Rosalene Billings, RN 09/17/2022 3:28 PM

## 2022-10-09 ENCOUNTER — Other Ambulatory Visit: Payer: Self-pay | Admitting: Internal Medicine

## 2022-10-19 ENCOUNTER — Ambulatory Visit (INDEPENDENT_AMBULATORY_CARE_PROVIDER_SITE_OTHER): Payer: Medicare Other

## 2022-10-19 DIAGNOSIS — I5022 Chronic systolic (congestive) heart failure: Secondary | ICD-10-CM | POA: Diagnosis not present

## 2022-10-19 DIAGNOSIS — Z95810 Presence of automatic (implantable) cardiac defibrillator: Secondary | ICD-10-CM | POA: Diagnosis not present

## 2022-10-21 NOTE — Progress Notes (Signed)
EPIC Encounter for ICM Monitoring  Patient Name: [name]. is a 81 y.o. male Date: 10/21/2022 Primary Care Physican: Derinda Late, MD Primary Cardiologist: [name] Electrophysiologist: Vergie Living Pacing: 99.7%         06/16/2022 Weight: 178.5 lbs 09/17/2022 Weight: 176 lbs 10/21/2022 Weight: 180 lbs   Time in AT/AF  0.0 hr/day (0.0%)                                                            Spoke with patient and heart failure questions reviewed.  Transmission results reviewed.  Pt asymptomatic for fluid accumulation.  Reports feeling well at this time and voices no complaints.     Optivol thoracic impedance suggesting normal fluid levels.   Prescribed:  Furosemide 40 mg Take 1.5 tablets (60 mg total) by mouth every OTHER day.     Labs: 07/23/2022 Creatinine 0.82, BN 19, Potasium 4.6, Sodium 142, GFR 89 07/14/2022 Creatinine 0.8, BUN 21, Potassium 4.3, Sodium 140, GFr 93 A complete set of results can be found in Results Review.   Recommendations:   No changes and encouraged to call if experiencing any fluid symptoms.   Follow-up plan: ICM clinic phone appointment on 11/23/2022.   91 day device clinic remote transmission 11/05/2022.     EP/Cardiology Office Visits: 11/17/2022 with Dr. [name].     Copy of ICM check sent to Dr. [name].  3 month ICM trend: 10/19/2022.    12-14 Month ICM trend:     Rosalene Billings, RN 10/21/2022 7:09 AM

## 2022-10-29 ENCOUNTER — Other Ambulatory Visit: Payer: Self-pay | Admitting: Internal Medicine

## 2022-10-29 DIAGNOSIS — Z95810 Presence of automatic (implantable) cardiac defibrillator: Secondary | ICD-10-CM

## 2022-11-05 ENCOUNTER — Ambulatory Visit (INDEPENDENT_AMBULATORY_CARE_PROVIDER_SITE_OTHER): Payer: Medicare Other

## 2022-11-05 DIAGNOSIS — I442 Atrioventricular block, complete: Secondary | ICD-10-CM

## 2022-11-05 LAB — CUP PACEART REMOTE DEVICE CHECK
Battery Remaining Longevity: 75 mo
Battery Voltage: 3.04 V
Brady Statistic AP VP Percent: 72.67 %
Brady Statistic AP VS Percent: 0.01 %
Brady Statistic AS VP Percent: 27.26 %
Brady Statistic AS VS Percent: 0.06 %
Brady Statistic RA Percent Paced: 72.26 %
Brady Statistic RV Percent Paced: 99.48 %
Date Time Interrogation Session: 20231116031807
HighPow Impedance: 53 Ohm
HighPow Impedance: 65 Ohm
Implantable Lead Connection Status: 753985
Implantable Lead Connection Status: 753985
Implantable Lead Connection Status: 753985
Implantable Lead Implant Date: 20070615
Implantable Lead Implant Date: 20120813
Implantable Lead Implant Date: 20120813
Implantable Lead Location: 753858
Implantable Lead Location: 753859
Implantable Lead Location: 753860
Implantable Lead Model: 4396
Implantable Lead Model: 5076
Implantable Lead Model: 7121
Implantable Pulse Generator Implant Date: 20230816
Lead Channel Impedance Value: 266 Ohm
Lead Channel Impedance Value: 342 Ohm
Lead Channel Impedance Value: 380 Ohm
Lead Channel Impedance Value: 494 Ohm
Lead Channel Impedance Value: 589 Ohm
Lead Channel Impedance Value: 969 Ohm
Lead Channel Pacing Threshold Amplitude: 1.125 V
Lead Channel Pacing Threshold Pulse Width: 0.4 ms
Lead Channel Sensing Intrinsic Amplitude: 0.375 mV
Lead Channel Sensing Intrinsic Amplitude: 0.375 mV
Lead Channel Setting Pacing Amplitude: 1.5 V
Lead Channel Setting Pacing Amplitude: 2 V
Lead Channel Setting Pacing Amplitude: 2.25 V
Lead Channel Setting Pacing Pulse Width: 0.4 ms
Lead Channel Setting Pacing Pulse Width: 1.5 ms
Lead Channel Setting Sensing Sensitivity: 0.3 mV
Zone Setting Status: 755011

## 2022-11-09 ENCOUNTER — Encounter: Payer: Medicare Other | Admitting: Internal Medicine

## 2022-11-17 ENCOUNTER — Ambulatory Visit: Payer: Medicare Other | Attending: Internal Medicine | Admitting: Internal Medicine

## 2022-11-17 ENCOUNTER — Encounter: Payer: Self-pay | Admitting: Internal Medicine

## 2022-11-17 VITALS — BP 118/72 | HR 61 | Ht 66.0 in | Wt 183.4 lb

## 2022-11-17 DIAGNOSIS — R001 Bradycardia, unspecified: Secondary | ICD-10-CM

## 2022-11-17 DIAGNOSIS — I480 Paroxysmal atrial fibrillation: Secondary | ICD-10-CM

## 2022-11-17 DIAGNOSIS — I459 Conduction disorder, unspecified: Secondary | ICD-10-CM | POA: Insufficient documentation

## 2022-11-17 DIAGNOSIS — I5032 Chronic diastolic (congestive) heart failure: Secondary | ICD-10-CM | POA: Diagnosis present

## 2022-11-17 DIAGNOSIS — Z95810 Presence of automatic (implantable) cardiac defibrillator: Secondary | ICD-10-CM | POA: Diagnosis present

## 2022-11-17 DIAGNOSIS — I255 Ischemic cardiomyopathy: Secondary | ICD-10-CM | POA: Diagnosis not present

## 2022-11-17 DIAGNOSIS — I509 Heart failure, unspecified: Secondary | ICD-10-CM | POA: Insufficient documentation

## 2022-11-17 LAB — CUP PACEART INCLINIC DEVICE CHECK
Battery Remaining Longevity: 76 mo
Battery Voltage: 3.01 V
Brady Statistic AP VP Percent: 76.47 %
Brady Statistic AP VS Percent: 0.01 %
Brady Statistic AS VP Percent: 23.45 %
Brady Statistic AS VS Percent: 0.08 %
Brady Statistic RA Percent Paced: 76.14 %
Brady Statistic RV Percent Paced: 99.64 %
Date Time Interrogation Session: 20231128164215
HighPow Impedance: 47 Ohm
HighPow Impedance: 59 Ohm
Implantable Lead Connection Status: 753985
Implantable Lead Connection Status: 753985
Implantable Lead Connection Status: 753985
Implantable Lead Implant Date: 20070615
Implantable Lead Implant Date: 20120813
Implantable Lead Implant Date: 20120813
Implantable Lead Location: 753858
Implantable Lead Location: 753859
Implantable Lead Location: 753860
Implantable Lead Model: 4396
Implantable Lead Model: 5076
Implantable Lead Model: 7121
Implantable Pulse Generator Implant Date: 20230816
Lead Channel Impedance Value: 247 Ohm
Lead Channel Impedance Value: 342 Ohm
Lead Channel Impedance Value: 342 Ohm
Lead Channel Impedance Value: 437 Ohm
Lead Channel Impedance Value: 494 Ohm
Lead Channel Impedance Value: 779 Ohm
Lead Channel Pacing Threshold Amplitude: 0.5 V
Lead Channel Pacing Threshold Amplitude: 1 V
Lead Channel Pacing Threshold Amplitude: 1.25 V
Lead Channel Pacing Threshold Pulse Width: 0.4 ms
Lead Channel Pacing Threshold Pulse Width: 0.4 ms
Lead Channel Pacing Threshold Pulse Width: 0.8 ms
Lead Channel Sensing Intrinsic Amplitude: 0.375 mV
Lead Channel Sensing Intrinsic Amplitude: 0.375 mV
Lead Channel Setting Pacing Amplitude: 1.5 V
Lead Channel Setting Pacing Amplitude: 2 V
Lead Channel Setting Pacing Amplitude: 2.5 V
Lead Channel Setting Pacing Pulse Width: 0.4 ms
Lead Channel Setting Pacing Pulse Width: 1 ms
Lead Channel Setting Sensing Sensitivity: 0.3 mV
Zone Setting Status: 755011

## 2022-11-17 MED ORDER — FUROSEMIDE 80 MG PO TABS: 80.0000 mg | ORAL_TABLET | ORAL | 3 refills | Status: DC

## 2022-11-17 NOTE — Progress Notes (Signed)
Patient Care Team: Derinda Late, MD as PCP - General (Family Medicine) [name], MD as PCP - Cardiology (Cardiology) [name], MD as PCP - Electrophysiology (Cardiology) Rocco Serene, MD (Unknown Physician Specialty)   HPI  Ronnie [place] Jaqwon Manfred. is a 81 y.o. male Seen in followup for ICD implantation initially for syncope in the setting of depressed left ventricular function and ischemic heart disease with remote LAD stenting.  He had a 6949-lead. He reached ERI summer 2012 and underwent CRT upgrade with new defibrillator lead insertion generator replacement 3/17 and 8/23   Atrial fibrillation persistent-- anticoagulation with Apixoban no bleeding   The patient denies chest pain , nocturnal dyspnea, orthopnea  There have been no palpitations, lightheadedness or syncope.  Somd DOE and edema-- response to diuretic is variable  He and his wife are celebrating a big anniversary    DATE TEST EF   2012 Cath  40 % Patent LAD stent  12/15 Echo   55-65 %   7/20 Echo  50-55%   10/21 Myoview  46% No Ischemia          Date K Cr Hgb LDL  6/18 4.3 0.9 14   2/19 4.1 0.9 12.5   3/21 4.6 0.92 13.8   2/22 4.2 0.8 12.2 76  7/22 4.7 0.8 11.2 53  1/23 4.5 0.8 11.8    8/23 4.6 0.82 11.7      Thromboembolic risk factors ( age  -2, HTN-1, TIA/CVA-2, Vasc disease -1, CHF -1) for a CHADSVASc Score of >=7     Past Medical History:  Diagnosis Date   (HFpEF) heart failure with preserved ejection fraction ([place])    a.) TTE 10/30/2010: EF 30-35%; sev anterior wall and apical HK. b.) TTE 11/20/2014: EF 50-55%; no RWMAs. c.) TTE 07/06/2019: EF 50-55%; no RWMAs.   Allergic rhinitis    Anemia    Aortic atherosclerosis (HCC)    Arthritis    Asthma    Atrial fibrillation ([place])    a.) CHA2DS2-VASc = 8 (age x 2, CHF, HTN, TIA x 2, prior MI, T2DM). b.) rate/rhythm maintained with bisoprolol; chronically anticoagulated with apixaban.   Biventricular ICD (implantable  cardioverter-defibrillator) in place 2007   a.) Medtronic device placed in 2007 with CRT upgrade in 07/2011. Generator changed 02/2016.   BPH (benign prostatic hyperplasia)    CAD (coronary artery disease) 01/14/1994   a.) cardiac arrest; ROSC achieved. LHC 01/14/1994 showed 95% pLAD stenosis. PCI performed --> 3.5 x 15 mm JJIS to pLAD.  b.) PCI performed in 2007 in [place], [place]; type and location of stent unknown. c.) LHC 2012 --> patent LAD stent; nonobstructive disease.   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)    Myocardial infarction (HCC)    Sciatic nerve pain    Severe sinus bradycardia    a.) s/p Medtronic BiV ICD placement   Syncope    T2DM (type 2 diabetes  mellitus) ([place])    TIA (transient ischemic attack) 10/2010    Past Surgical History:  Procedure Laterality Date   CATARACT EXTRACTION, BILATERAL     COLONOSCOPY WITH PROPOFOL N/A 04/01/2018   Procedure: COLONOSCOPY WITH PROPOFOL;  Surgeon: [name], MD;  Location: [facility] ENDOSCOPY;  Service: Endoscopy;  Laterality: N/A;   CORONARY ANGIOPLASTY     LAD stent later additional stent placed 02/2006 branch of LAD   CYSTOSCOPY WITH INSERTION OF UROLIFT N/A 07/15/2020   Procedure: CYSTOSCOPY WITH INSERTION OF UROLIFT;  Surgeon: [name], MD;  Location: [facility] ORS;  Service: Urology;  Laterality: N/A;   CYSTOSCOPY WITH INSERTION OF UROLIFT N/A 11/03/2021   Procedure: CYSTOSCOPY WITH INSERTION OF UROLIFT;  Surgeon: [name], MD;  Location: [facility] ORS;   Service: Urology;  Laterality: N/A;   CYSTOSCOPY WITH LITHOLAPAXY N/A 07/15/2020   Procedure: CYSTOSCOPY WITH LITHOLAPAXY;  Surgeon: [name], MD;  Location: [facility] ORS;  Service: Urology;  Laterality: N/A;   CYSTOSCOPY WITH LITHOLAPAXY N/A 11/03/2021   Procedure: CYSTOSCOPY WITH LITHOLAPAXY;  Surgeon: [name], MD;  Location: [facility] ORS;  Service: Urology;  Laterality: N/A;   EP IMPLANTABLE DEVICE N/A 03/18/2016   Procedure:  BiV ICD Generator Changeout;  Surgeon: [name], MD;  Location: [facility];  Service: Cardiovascular;  Laterality: N/A;   ESOPHAGOGASTRODUODENOSCOPY (EGD) WITH PROPOFOL N/A 04/01/2018   Procedure: ESOPHAGOGASTRODUODENOSCOPY (EGD) WITH PROPOFOL;  Surgeon: [name], MD;  Location: [facility] ENDOSCOPY;  Service: Endoscopy;  Laterality: N/A;   ICD GENERATOR CHANGEOUT N/A 08/05/2022   Procedure: ICD GENERATOR CHANGEOUT;  Surgeon: [name], MD;  Location: [facility];  Service: Cardiovascular;  Laterality: N/A;   Implantation of Medtronic dual-chamber cardioverter N/A 2007   Left Knee Replacement  12/21/2004   PROSTATE BIOPSY     TONSILLECTOMY  12/21/1946   TOTAL KNEE REVISION Left 06/20/2015   Procedure: TOTAL KNEE REVISION;  Surgeon: [name], MD;  Location: [facility] ORS;  Service: Orthopedics;  Laterality: Left;    Current Outpatient Medications  Medication Sig Dispense Refill   acetaminophen (TYLENOL) 650 MG CR tablet Take 650 mg by mouth 2 (two) times daily.     albuterol (VENTOLIN HFA) 108 (90 Base) MCG/ACT inhaler Inhale 1-2 puffs into the lungs every 6 (six) hours as needed for wheezing or shortness of breath.     bacitracin ointment Apply 1 Application topically daily as needed for wound care.     Calcium Citrate-Vitamin D (CITRACAL + D PO) Take 1 tablet by mouth 2 (two) times daily.     cycloSPORINE (RESTASIS) 0.05 % ophthalmic emulsion Place 1 drop into both eyes 2 (two) times daily.     dorzolamide (TRUSOPT) 2 %  ophthalmic solution Place 1 drop into the right eye 2 (two) times daily.     dutasteride (AVODART) 0.5 MG capsule TAKE ONE CAPSULE BY MOUTH EVERY 3 DAYS 90 capsule 3   ELIQUIS 5 MG TABS tablet TAKE ONE TABLET TWICE DAILY 180 tablet 1   furosemide (LASIX) 40 MG tablet TAKE ONE AND A HALF TABLETS (60 MG) BY MOUTH EVERY OTHER DAY (Patient taking differently: 2 pills every 3rd day) 25 tablet 9   glipiZIDE (GLUCOTROL XL) 5 MG 24 hr tablet Take 5 mg by mouth daily.     latanoprost (XALATAN) 0.005 % ophthalmic solution Place 1 drop into the right eye at bedtime.      losartan (COZAAR) 50 MG tablet Take 50 mg by mouth daily.  metFORMIN (GLUCOPHAGE-XR) 500 MG 24 hr tablet Take 500 mg by mouth in the morning and at bedtime.      montelukast (SINGULAIR) 10 MG tablet Take 10 mg by mouth daily.      Multiple Vitamin (MULTIVITAMIN) tablet Take 1 tablet by mouth daily.     omeprazole (PRILOSEC) 40 MG capsule Take 40 mg by mouth every morning.     rosuvastatin (CRESTOR) 40 MG tablet TAKE 1 TABLET BY MOUTH DAILY 60 tablet 2   traZODone (DESYREL) 100 MG tablet Take 100 mg by mouth at bedtime.     No current facility-administered medications for this visit.    Allergies  Allergen Reactions   Beta Adrenergic Blockers Other (See Comments)    Fatigue   Oxycodone Nausea And Vomiting   Sulfonamide Derivatives Nausea And Vomiting    Review of Systems negative except from HPI and PMH  Physical Exam BP 118/72   Pulse 61   Ht '5\' 6"'$  (1.676 m)   Wt 183 lb 6.4 oz (83.2 kg)   SpO2 96%   BMI 29.60 kg/m  Well developed and well nourished in no acute distress HENT normal Neck supple with JVP-flat Clear Device pocket well healed; without hematoma or erythema.  There is no tethering  Regular rate and rhythm,  murmur Abd-soft with active BS No Clubbing cyanosis 1+ edema  Skin-warm and dry A & Oriented  Grossly normal sensory and motor function  ECG sinus @ 61 P-synchronous/ AV  pacing    Device function  is  normal.  Programming changes none  See Paceart for details    ECG sinus w P-synchronous/ AV  pacing  QRSd 162 Upright QRS lead I and rS lead V1      Assessment and  Plan \ Ischemic Cardiomyopathy -- interval normalization prior LAD stenting  Complete heart block    Congestive heart failure chronic/diastolic  Implantable defibrillator-CRT Medtronic   Atrial fibrillation paroxysmal  Bradycardia-sinus  Hypertension  Hyperlipidemia  Anemia --chronic low grade    S/p CRT ICD gen change; tolerated well  Mildly volume overloaded;  his diuretic response is variable, will increase to 80 mg and have him take every other day  On Anticoagulant for thromboembolic risk reduction.  No bleeding issues.  Continue Apixaban  5 bid.  With cardiomyopathy continue losartan   we had held off on additional med 2/2 interval recovery of eF  will recheck

## 2022-11-17 NOTE — Patient Instructions (Signed)
Medication Instructions:  Your physician has recommended you make the following change in your medication:   Begin taking Furosemide '80mg'$  - 1 tablet by mouth every other day.  *If you need a refill on your cardiac medications before your next appointment, please call your pharmacy*   Lab Work: None ordered.  If you have labs (blood work) drawn today and your tests are completely normal, you will receive your results only by: [place] (if you have MyChart) OR A paper copy in the mail If you have any lab test that is abnormal or we need to change your treatment, we will call you to review the results.   Testing/Procedures: None ordered.    Follow-Up: At [facility], you and your health needs are our priority.  As part of our continuing mission to provide you with exceptional heart care, we have created designated Provider Care Teams.  These Care Teams include your primary Cardiologist (physician) and Advanced Practice Providers (APPs -  Physician Assistants and Nurse Practitioners) who all work together to provide you with the care you need, when you need it.  We recommend signing up for the patient portal called "MyChart".  Sign up information is provided on this After Visit Summary.  MyChart is used to connect with patients for Virtual Visits (Telemedicine).  Patients are able to view lab/test results, encounter notes, upcoming appointments, etc.  Non-urgent messages can be sent to your provider as well.   To learn more about what you can do with MyChart, go to [email or web address].    Your next appointment:   9 months with Dr [name]  Important Information About Sugar

## 2022-11-23 ENCOUNTER — Ambulatory Visit (INDEPENDENT_AMBULATORY_CARE_PROVIDER_SITE_OTHER): Payer: Medicare Other

## 2022-11-23 DIAGNOSIS — Z95810 Presence of automatic (implantable) cardiac defibrillator: Secondary | ICD-10-CM | POA: Diagnosis not present

## 2022-11-23 DIAGNOSIS — I5032 Chronic diastolic (congestive) heart failure: Secondary | ICD-10-CM | POA: Diagnosis not present

## 2022-11-23 NOTE — Progress Notes (Unsigned)
EPIC Encounter for ICM Monitoring  Patient Name: [name]. is a 81 y.o. male Date: 11/23/2022 Primary Care Physican: Derinda Late, MD Primary Cardiologist: [name] Electrophysiologist: Vergie Living Pacing: 99.5%         06/16/2022 Weight: 178.5 lbs 09/17/2022 Weight: 176 lbs 10/21/2022 Weight: 180 lbs 11/23/2022 Weight: 181.6 lbs 11/24/2022 Weight: 178.2 lbs    Time in AT/AF  0.0 hr/day (0.0%)                                                            Spoke with patient and heart failure questions reviewed.  Transmission results reviewed. He was in [place] for the holiday and was off his schedule for lasix and diet. [name]  He lost 4 lbs over night after taking prescribed 80 mg dosage of Lasix.   Optivol thoracic impedance suggesting possible fluid accumulation starting 11/22.   Prescribed:  Furosemide 80 mg Take 1 tablet (s) (80 mg total) by mouth every OTHER day.     Labs: 07/23/2022 Creatinine 0.82, BN 19, Potasium 4.6, Sodium 142, GFR 89 07/14/2022 Creatinine 0.8, BUN 21, Potassium 4.3, Sodium 140, GFr 93 A complete set of results can be found in Results Review.   Recommendations:   He will try to send manual transmission tomorrow, 12/6 for review.  He thinks the fluid may be resolved after losing 4 lbs last night.     Follow-up plan: ICM clinic phone appointment on 11/30/2022 to recheck fluid accumulation.   91 day device clinic remote transmission 02/04/2023.     EP/Cardiology Office Visits:  Recall 08/14/2023 with Dr. [name].     Copy of ICM check sent to Dr. [name].  3 month ICM trend: 11/23/2022.    12-14 Month ICM trend:     Rosalene Billings, RN 11/23/2022 10:34 AM

## 2022-11-25 NOTE — Progress Notes (Signed)
Remote ICD transmission.   

## 2022-11-25 NOTE — Progress Notes (Signed)
Spoke with patient and transmission results reviewed.  He stated he is feeling well and thought the fluid accumulation has resolved.  Advised 12/5 updated report suggesting fluid levels returned to normal.  He appreciated the call back.  Next ICM remote transmission scheduled for 01/04/2023.  No changes and encouraged to call if experiencing any fluid symptoms.  11/24/2022 Optivol thoracic impedance suggesting fluid levels returned to normal.

## 2022-12-10 ENCOUNTER — Telehealth: Payer: Self-pay

## 2022-12-10 DIAGNOSIS — I4891 Unspecified atrial fibrillation: Secondary | ICD-10-CM

## 2022-12-10 DIAGNOSIS — I255 Ischemic cardiomyopathy: Secondary | ICD-10-CM

## 2022-12-10 NOTE — Telephone Encounter (Signed)
-----   Message from [name], MD sent at 11/20/2022 10:38 AM EST ----- M  good am  could you please contact this guy and  see if willing to reassess LVEF   the last measurement wsa down a bit and may have impications re meds Thanks SK

## 2022-12-10 NOTE — Telephone Encounter (Signed)
Spoke with pt and advised of Dr Olin Pia recommendation for repeat echo.  Pt verbalizes understanding and is agreeable.  Order placed and pt advised he will be contacted to schedule appointment.

## 2022-12-22 ENCOUNTER — Other Ambulatory Visit: Payer: Medicare Other

## 2022-12-22 DIAGNOSIS — N400 Enlarged prostate without lower urinary tract symptoms: Secondary | ICD-10-CM

## 2022-12-23 LAB — PSA: Prostate Specific Ag, Serum: 4.3 ng/mL — ABNORMAL HIGH (ref 0.0–4.0)

## 2022-12-30 ENCOUNTER — Ambulatory Visit (INDEPENDENT_AMBULATORY_CARE_PROVIDER_SITE_OTHER): Payer: Medicare Other | Admitting: Urology

## 2022-12-30 VITALS — BP 120/75 | HR 78 | Ht 66.0 in

## 2022-12-30 DIAGNOSIS — N400 Enlarged prostate without lower urinary tract symptoms: Secondary | ICD-10-CM | POA: Diagnosis not present

## 2022-12-30 DIAGNOSIS — R972 Elevated prostate specific antigen [PSA]: Secondary | ICD-10-CM | POA: Diagnosis not present

## 2022-12-30 LAB — BLADDER SCAN AMB NON-IMAGING: Scan Result: 42

## 2022-12-30 NOTE — Progress Notes (Signed)
I, DeAsia L Maxie,acting as a scribe for [name], MD.,have documented all relevant documentation on the behalf of [name], MD,as directed by  [name], MD while in the presence of [name], MD.   I, Gery Pray Plume,acting as a scribe for [name], MD.,have documented all relevant documentation on the behalf of [name], MD,as directed by  [name], MD while in the presence of [name], MD.   12/30/2022 3:19 PM   [name]. Oct 20, 1941 [id number]  Referring provider: Derinda Late, MD [phone] [place],  [place] [address]  Chief Complaint  Patient presents with   Benign Prostatic Hypertrophy    HPI: 82 year-old male with a history of elevated PSA, BPH, bladder stones, and high risk hematuria.   He is s/p UroLift on 06/2020, along with treatment of his bladder stones.  His most recent PSA was 4.3 on 12/22/22, which is within normal range for him.   He is on chronic Avodart every third day. He notes occasional post void dribbling but not really bothersome. Otherwise he is satisfied with his urinary symptoms.   He has a history of congestive heart failure, managed with Lasix but causes increased frequency.  Results for orders placed or performed in visit on 12/30/22  Bladder Scan (Post Void Residual) in office  Result Value Ref Range   Scan Result 42 ml      IPSS     Row Name 12/30/22 1400         International Prostate Symptom Score   How often have you had the sensation of not emptying your bladder? Less than half the time     How often have you had to urinate less than every two hours? Less than 1 in 5 times     How often have you found you stopped and started again several times when you urinated? About half the time     How often have you found it difficult to postpone urination? Not at All     How often have you had a weak urinary stream? Less than half  the time     How often have you had to strain to start urination? Not at All     How many times did you typically get up at night to urinate? 1 Time     Total IPSS Score 9       Quality of Life due to urinary symptoms   If you were to spend the rest of your life with your urinary condition just the way it is now how would you feel about that? Mostly Satisfied              Score:  1-7 Mild 8-19 Moderate 20-35 Severe   PMH: Past Medical History:  Diagnosis Date   (HFpEF) heart failure with preserved ejection fraction ([facility])    a.) TTE 10/30/2010: EF 30-35%; sev anterior wall and apical HK. b.) TTE 11/20/2014: EF 50-55%; no RWMAs. c.) TTE 07/06/2019: EF 50-55%; no RWMAs.   Allergic rhinitis    Anemia    Aortic atherosclerosis (HCC)    Arthritis    Asthma    Atrial fibrillation ([facility])    a.) CHA2DS2-VASc = 8 (age x 2, CHF, HTN, TIA x 2, prior MI, T2DM). b.) rate/rhythm maintained with bisoprolol; chronically anticoagulated with apixaban.   Biventricular ICD (implantable cardioverter-defibrillator) in place 2007   a.) Medtronic device placed in 2007  with CRT upgrade in 07/2011. Generator changed 02/2016.   BPH (benign prostatic hyperplasia)    CAD (coronary artery disease) 01/14/1994   a.) cardiac arrest; ROSC achieved. LHC 01/14/1994 showed 95% pLAD stenosis. PCI performed --> 3.5 x 15 mm JJIS to pLAD.  b.) PCI performed in 2007 in [place], [place]; type and location of stent unknown. c.) LHC 2012 --> patent LAD stent; nonobstructive disease.   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)    Myocardial infarction ([place])    Sciatic nerve pain    Severe sinus bradycardia    a.) s/p Medtronic BiV ICD placement   Syncope    T2DM (type 2 diabetes mellitus) ([facility])    TIA (transient ischemic attack) 10/2010    Surgical History: Past Surgical History:  Procedure Laterality Date   CATARACT EXTRACTION, BILATERAL     COLONOSCOPY WITH PROPOFOL N/A 04/01/2018   Procedure: COLONOSCOPY WITH PROPOFOL;  Surgeon: [name], MD;  Location: [facility] ENDOSCOPY;  Service: Endoscopy;  Laterality: N/A;   CORONARY ANGIOPLASTY     LAD stent later additional stent placed 02/2006 branch of LAD   CYSTOSCOPY WITH INSERTION OF UROLIFT N/A 07/15/2020   Procedure: CYSTOSCOPY WITH INSERTION OF UROLIFT;  Surgeon: [name], MD;  Location: [facility] ORS;  Service: Urology;  Laterality: N/A;   CYSTOSCOPY WITH INSERTION OF UROLIFT N/A 11/03/2021   Procedure: CYSTOSCOPY WITH INSERTION OF UROLIFT;  Surgeon: [name], MD;  Location: [facility] ORS;  Service: Urology;  Laterality: N/A;   CYSTOSCOPY WITH LITHOLAPAXY N/A 07/15/2020   Procedure: CYSTOSCOPY WITH LITHOLAPAXY;  Surgeon: [name], MD;  Location: [facility] ORS;  Service: Urology;  Laterality: N/A;   CYSTOSCOPY WITH LITHOLAPAXY N/A 11/03/2021   Procedure: CYSTOSCOPY WITH LITHOLAPAXY;  Surgeon: [name], MD;  Location: [facility] ORS;  Service: Urology;  Laterality: N/A;   EP IMPLANTABLE DEVICE N/A 03/18/2016   Procedure:  BiV ICD Generator Changeout;  Surgeon: [name], MD;  Location: [facility] CV LAB;  Service: Cardiovascular;  Laterality: N/A;   ESOPHAGOGASTRODUODENOSCOPY (EGD) WITH PROPOFOL N/A 04/01/2018   Procedure: ESOPHAGOGASTRODUODENOSCOPY (EGD) WITH PROPOFOL;  Surgeon: [name], MD;  Location: [facility] ENDOSCOPY;  Service: Endoscopy;  Laterality: N/A;   ICD GENERATOR CHANGEOUT N/A 08/05/2022   Procedure: ICD GENERATOR CHANGEOUT;  Surgeon: [name], MD;   Location: [facility] LAB;  Service: Cardiovascular;  Laterality: N/A;   Implantation of Medtronic dual-chamber cardioverter N/A 2007   Left Knee Replacement  12/21/2004   PROSTATE BIOPSY     TONSILLECTOMY  12/21/1946   TOTAL KNEE REVISION Left 06/20/2015   Procedure: TOTAL KNEE REVISION;  Surgeon: [name], MD;  Location: [facility] ORS;  Service: Orthopedics;  Laterality: Left;    Home Medications:  Allergies as of 12/30/2022       Reactions   Beta Adrenergic Blockers Other (See Comments)   Fatigue   Oxycodone Nausea And Vomiting   Sulfonamide Derivatives Nausea And  Vomiting        Medication List        Accurate as of December 30, 2022  3:19 PM. If you have any questions, ask your nurse or doctor.          STOP taking these medications    bacitracin ointment       TAKE these medications    acetaminophen 650 MG CR tablet Commonly known as: TYLENOL Take 650 mg by mouth 2 (two) times daily.   albuterol 108 (90 Base) MCG/ACT inhaler Commonly known as: VENTOLIN HFA Inhale 1-2 puffs into the lungs every 6 (six) hours as needed for wheezing or shortness of breath.   CITRACAL + D PO Take 1 tablet by mouth 2 (two) times daily.   cycloSPORINE 0.05 % ophthalmic emulsion Commonly known as: RESTASIS Place 1 drop into both eyes 2 (two) times daily.   dorzolamide 2 % ophthalmic solution Commonly known as: TRUSOPT Place 1 drop into the right eye 2 (two) times daily.   dutasteride 0.5 MG capsule Commonly known as: AVODART TAKE ONE CAPSULE BY MOUTH EVERY 3 DAYS   Eliquis 5 MG Tabs tablet Generic drug: apixaban TAKE ONE TABLET TWICE DAILY   furosemide 80 MG tablet Commonly known as: LASIX Take 1 tablet (80 mg total) by mouth every other day. What changed: additional instructions   glipiZIDE 5 MG 24 hr tablet Commonly known as: GLUCOTROL XL Take 5 mg by mouth daily.   latanoprost 0.005 % ophthalmic solution Commonly known as: XALATAN Place 1 drop into the  right eye at bedtime.   losartan 50 MG tablet Commonly known as: COZAAR Take 50 mg by mouth daily.   metFORMIN 500 MG 24 hr tablet Commonly known as: GLUCOPHAGE-XR Take 500 mg by mouth in the morning and at bedtime.   montelukast 10 MG tablet Commonly known as: SINGULAIR Take 10 mg by mouth daily.   multivitamin tablet Take 1 tablet by mouth daily.   omeprazole 40 MG capsule Commonly known as: PRILOSEC Take 40 mg by mouth every morning.   rosuvastatin 40 MG tablet Commonly known as: CRESTOR TAKE 1 TABLET BY MOUTH DAILY   traZODone 100 MG tablet Commonly known as: DESYREL Take 100 mg by mouth at bedtime.        Allergies:  Allergies  Allergen Reactions   Beta Adrenergic Blockers Other (See Comments)    Fatigue   Oxycodone Nausea And Vomiting   Sulfonamide Derivatives Nausea And Vomiting    Family History: Family History  Problem Relation Age of Onset   Emphysema Father    Lung cancer Father    Cirrhosis Mother    Hypertension Brother    Hypertension Son    Heart attack Neg Hx    Stroke Neg Hx     Social History:  reports that he has never smoked. He has never used smokeless tobacco. He reports current alcohol use of about 7.0 standard drinks of alcohol per week. He reports that he does not use drugs.   Physical Exam: BP 120/75   Pulse 78   Ht '5\' 6"'$  (1.676 m)   BMI 29.60 kg/m   Constitutional:  Alert and oriented, No acute distress. HEENT: [place] AT, moist mucus membranes.  Trachea midline, no masses. Neurologic: Grossly intact, no focal deficits, moving all 4 extremities. Psychiatric: Normal mood and affect.  Assessment & Plan:    BPH - Would recommend continuing the Avodart.   2. Elevated PSA - PSA is probably appropriate for his  age, will defer a rectal exam today. It is within his range of normal.   Return in about 1 year (around 12/31/2023) for PSA/ PVR/ IPSS.   [facility] [address], [place], [place] [address] [phone]

## 2023-01-04 ENCOUNTER — Ambulatory Visit (INDEPENDENT_AMBULATORY_CARE_PROVIDER_SITE_OTHER): Payer: Medicare Other

## 2023-01-04 DIAGNOSIS — Z95810 Presence of automatic (implantable) cardiac defibrillator: Secondary | ICD-10-CM | POA: Diagnosis not present

## 2023-01-04 DIAGNOSIS — I5032 Chronic diastolic (congestive) heart failure: Secondary | ICD-10-CM

## 2023-01-05 NOTE — Progress Notes (Signed)
EPIC Encounter for ICM Monitoring  Patient Name: [name]. is a 82 y.o. male Date: 01/05/2023 Primary Care Physican: Derinda Late, MD Primary Cardiologist: [name] Electrophysiologist: Vergie Living Pacing: 99.6%         06/16/2022 Weight: 178.5 lbs 09/17/2022 Weight: 176 lbs 10/21/2022 Weight: 180 lbs 11/23/2022 Weight: 181.6 lbs 11/24/2022 Weight: 178.2 lbs 01/06/2023 Weight: 177-178   Time in AT/AF <0.1 hr/day (<0.1%)                                                            Spoke with patient and heart failure questions reviewed.  Transmission results reviewed.  Pt reports during decreased impedance, weight was up to 182 lbs and legs were slightly swollen but symptoms have almost resolved after getting back on track with med and diet.   He had 50th wedding anniversary and was on a different schedule and eating different foods.    Optivol thoracic impedance suggesting fluid levels trending close to baseline.   Prescribed:  Furosemide 80 mg Take 1 tablet (s) (80 mg total) by mouth every OTHER day.     Labs: 07/23/2022 Creatinine 0.82, BN 19, Potasium 4.6, Sodium 142, GFR 89 07/14/2022 Creatinine 0.8, BUN 21, Potassium 4.3, Sodium 140, GFr 93 A complete set of results can be found in Results Review.   Recommendations:   No changes and encouraged to call if experiencing any fluid symptoms.   Follow-up plan: ICM clinic phone appointment on 02/09/2023.   91 day device clinic remote transmission 02/04/2023.     EP/Cardiology Office Visits:  Recall 08/14/2023 with Dr. [name].     Copy of ICM check sent to Dr. [name].   3 month ICM trend: 01/04/2023.    12-14 Month ICM trend:     Rosalene Billings, RN 01/05/2023 8:52 AM

## 2023-01-07 ENCOUNTER — Ambulatory Visit (HOSPITAL_COMMUNITY): Payer: Medicare Other | Attending: Internal Medicine

## 2023-01-07 DIAGNOSIS — I255 Ischemic cardiomyopathy: Secondary | ICD-10-CM | POA: Diagnosis present

## 2023-01-07 DIAGNOSIS — I4891 Unspecified atrial fibrillation: Secondary | ICD-10-CM | POA: Diagnosis present

## 2023-01-07 LAB — ECHOCARDIOGRAM COMPLETE
Calc EF: 49.7 %
P 1/2 time: 700 msec
S' Lateral: 3.4 cm
Single Plane A2C EF: 58.8 %
Single Plane A4C EF: 41.2 %

## 2023-01-23 ENCOUNTER — Other Ambulatory Visit: Payer: Self-pay | Admitting: Internal Medicine

## 2023-01-23 DIAGNOSIS — I480 Paroxysmal atrial fibrillation: Secondary | ICD-10-CM

## 2023-01-25 NOTE — Telephone Encounter (Signed)
Prescription refill request for Eliquis received. Indication:afib Last office visit:11/23 Scr:0.9  1/24 Age: 82 Weight:83.2  kg  Prescription refilled

## 2023-02-02 ENCOUNTER — Telehealth: Payer: Self-pay | Admitting: Internal Medicine

## 2023-02-02 NOTE — Telephone Encounter (Signed)
Spoke with pt and discussed echo results and recommendations as below per Dr [name].  Pt verbalizes understanding and states he will consider.  He will contact Dr Danella Penton with his decision.   As per our phone conversation:   Dr [name] has reviewed your echo results which shows stable heart muscle function in the mid to high 40%. Per Dr [name] - 2 thoughts   1 - Spironolactone 12.61m - 1 tablet by mouth daily and labs to check kidney function in 2 weeks.     2 - Talk to your PCP about starting Jardiance or FWilder Glade(these are called SGLT2 medications) in the context of your diabetes the benefits would be similar to the above.   Let uKoreaknow if you have any further questions.   Thank You,   MDan Europe

## 2023-02-02 NOTE — Telephone Encounter (Signed)
Patient returned call for his echo results

## 2023-02-04 ENCOUNTER — Ambulatory Visit (INDEPENDENT_AMBULATORY_CARE_PROVIDER_SITE_OTHER): Payer: Medicare Other

## 2023-02-04 DIAGNOSIS — I255 Ischemic cardiomyopathy: Secondary | ICD-10-CM

## 2023-02-05 LAB — CUP PACEART REMOTE DEVICE CHECK
Battery Remaining Longevity: 75 mo
Battery Voltage: 3.01 V
Brady Statistic AP VP Percent: 99.81 %
Brady Statistic AP VS Percent: 0.13 %
Brady Statistic AS VP Percent: 0.06 %
Brady Statistic AS VS Percent: 0 %
Brady Statistic RA Percent Paced: 99.93 %
Brady Statistic RV Percent Paced: 98.82 %
Date Time Interrogation Session: 20240215012404
HighPow Impedance: 51 Ohm
HighPow Impedance: 66 Ohm
Implantable Lead Connection Status: 753985
Implantable Lead Connection Status: 753985
Implantable Lead Connection Status: 753985
Implantable Lead Implant Date: 20070615
Implantable Lead Implant Date: 20120813
Implantable Lead Implant Date: 20120813
Implantable Lead Location: 753858
Implantable Lead Location: 753859
Implantable Lead Location: 753860
Implantable Lead Model: 4396
Implantable Lead Model: 5076
Implantable Lead Model: 7121
Implantable Pulse Generator Implant Date: 20230816
Lead Channel Impedance Value: 247 Ohm
Lead Channel Impedance Value: 342 Ohm
Lead Channel Impedance Value: 380 Ohm
Lead Channel Impedance Value: 456 Ohm
Lead Channel Impedance Value: 589 Ohm
Lead Channel Impedance Value: 950 Ohm
Lead Channel Pacing Threshold Amplitude: 1.125 V
Lead Channel Pacing Threshold Pulse Width: 0.4 ms
Lead Channel Sensing Intrinsic Amplitude: 0.25 mV
Lead Channel Sensing Intrinsic Amplitude: 0.25 mV
Lead Channel Setting Pacing Amplitude: 1.5 V
Lead Channel Setting Pacing Amplitude: 2 V
Lead Channel Setting Pacing Amplitude: 2.25 V
Lead Channel Setting Pacing Pulse Width: 0.4 ms
Lead Channel Setting Pacing Pulse Width: 1 ms
Lead Channel Setting Sensing Sensitivity: 0.3 mV
Zone Setting Status: 755011

## 2023-02-09 ENCOUNTER — Ambulatory Visit: Payer: Medicare Other

## 2023-02-09 DIAGNOSIS — I5032 Chronic diastolic (congestive) heart failure: Secondary | ICD-10-CM | POA: Diagnosis not present

## 2023-02-09 DIAGNOSIS — Z95810 Presence of automatic (implantable) cardiac defibrillator: Secondary | ICD-10-CM

## 2023-02-12 NOTE — Progress Notes (Signed)
EPIC Encounter for ICM Monitoring  Patient Name: [name]. is a 82 y.o. male Date: 02/12/2023 Primary Care Physican: Derinda Late, MD Primary Cardiologist: [name] Electrophysiologist: Vergie Living Pacing: 96%         11/24/2022 Weight: 178.2 lbs 01/06/2023 Weight: 177-178 lbs 02/09/2023 Weight: 180 lbs   Time in AT/AF 0.0 hr/day (0.0%)                                                            Spoke with patient and heart failure questions reviewed.  Transmission results reviewed.  Pt reports weight gain of 2 lbs in the last few days which may be related to he has been taking a Furosemide 60 mg every other day instead of 80 mg every other day.   Pt has a different Lasix prescription that was filled which reads 60 mg every other day.  Advised current prescription on file was sent at time of 11/28 OV with Dr [name] which reads to take 80 mg every other day and was sent to [facility].  He will call the pharmacy to determine why he has a different prescription.     Optivol thoracic impedance suggesting normal fluid levels with exception of 2/14-2/18.   Prescribed:  Furosemide 80 mg Take 1 tablet (s) (80 mg total) by mouth every OTHER day.     Labs: 07/23/2022 Creatinine 0.82, BN 19, Potasium 4.6, Sodium 142, GFR 89 07/14/2022 Creatinine 0.8, BUN 21, Potassium 4.3, Sodium 140, GFr 93 A complete set of results can be found in Results Review.   Recommendations:   No changes and encouraged to call if experiencing any fluid symptoms.   Follow-up plan: ICM clinic phone appointment on 03/15/2023.   91 day device clinic remote transmission 05/06/2023.     EP/Cardiology Office Visits:  Recall 08/14/2023 with Dr. [name].     Copy of ICM check sent to Dr. [name].  3 month ICM trend: 02/09/2023.    12-14 Month ICM trend:     Rosalene Billings, RN 02/12/2023 1:20 PM

## 2023-03-04 NOTE — Progress Notes (Signed)
Remote ICD transmission.   

## 2023-03-15 ENCOUNTER — Ambulatory Visit: Payer: Medicare Other | Attending: Internal Medicine

## 2023-03-15 DIAGNOSIS — Z95810 Presence of automatic (implantable) cardiac defibrillator: Secondary | ICD-10-CM

## 2023-03-15 DIAGNOSIS — I5032 Chronic diastolic (congestive) heart failure: Secondary | ICD-10-CM | POA: Diagnosis not present

## 2023-03-19 NOTE — Progress Notes (Signed)
EPIC Encounter for ICM Monitoring  Patient Name: [name]. is a 82 y.o. male Date: 03/19/2023 Primary Care Physican: Derinda Late, MD Primary Cardiologist: [name] Electrophysiologist: Vergie Living Pacing: 97.4%         11/24/2022 Weight: 178.2 lbs 01/06/2023 Weight: 177-178 lbs 02/09/2023 Weight: 180 lbs   Time in AT/AF 0.0 hr/day (0.0%)                                                            Spoke with patient and heart failure questions reviewed.  Transmission results reviewed.  Pt asymptomatic for fluid accumulation.  Reports feeling well at this time and voices no complaints.     Optivol thoracic impedance suggesting normal fluid levels.   Prescribed:  Furosemide 80 mg Take 1 tablet (s) (80 mg total) by mouth every OTHER day.     Labs: 07/23/2022 Creatinine 0.82, BN 19, Potasium 4.6, Sodium 142, GFR 89 07/14/2022 Creatinine 0.8, BUN 21, Potassium 4.3, Sodium 140, GFr 93 A complete set of results can be found in Results Review.   Recommendations:   No changes and encouraged to call if experiencing any fluid symptoms.   Follow-up plan: ICM clinic phone appointment on 04/19/2023.   91 day device clinic remote transmission 05/06/2023.     EP/Cardiology Office Visits:  Recall 08/14/2023 with Dr. [name].     Copy of ICM check sent to Dr. [name].  3 month ICM trend: 03/15/2023.    12-14 Month ICM trend:     Rosalene Billings, RN 03/19/2023 12:05 PM

## 2023-04-05 ENCOUNTER — Encounter: Payer: Self-pay | Admitting: Internal Medicine

## 2023-04-05 MED ORDER — FUROSEMIDE 80 MG PO TABS: 80.0000 mg | ORAL_TABLET | ORAL | 3 refills | Status: DC

## 2023-04-19 ENCOUNTER — Ambulatory Visit: Payer: Medicare Other | Attending: Internal Medicine

## 2023-04-19 DIAGNOSIS — Z95810 Presence of automatic (implantable) cardiac defibrillator: Secondary | ICD-10-CM | POA: Diagnosis not present

## 2023-04-19 DIAGNOSIS — I5032 Chronic diastolic (congestive) heart failure: Secondary | ICD-10-CM | POA: Diagnosis not present

## 2023-04-23 NOTE — Progress Notes (Signed)
EPIC Encounter for ICM Monitoring  Patient Name: [name]. is a 82 y.o. male Date: 04/23/2023 Primary Care Physican: Kandyce Rud, MD Primary Cardiologist: Graciela Husbands Electrophysiologist: Joycelyn Schmid Pacing: 97.0%         11/24/2022 Weight: 178.2 lbs 01/06/2023 Weight: 177-178 lbs 02/09/2023 Weight: 180 lbs 04/23/2023 Weight: 181 lbs   Time in AT/AF 0.0 hr/day (0.0%)                                                            Spoke with patient and heart failure questions reviewed.  Transmission results reviewed.  Pt occasionally has some puffiness in ankles.  Reports feeling well at this time and voices no complaints.     Optivol thoracic impedance suggesting normal fluid levels.   Prescribed:  Furosemide 80 mg Take 1 tablet (s) (80 mg total) by mouth every OTHER day.     Labs: 07/23/2022 Creatinine 0.82, BN 19, Potasium 4.6, Sodium 142, GFR 89 07/14/2022 Creatinine 0.8, BUN 21, Potassium 4.3, Sodium 140, GFr 93 A complete set of results can be found in Results Review.   Recommendations:   No changes and encouraged to call if experiencing any fluid symptoms.   Follow-up plan: ICM clinic phone appointment on 05/24/2023.   91 day device clinic remote transmission 05/06/2023.     EP/Cardiology Office Visits:  Recall 08/14/2023 with Dr. Graciela Husbands.     Copy of ICM check sent to Dr. Graciela Husbands.  3 month ICM trend: 04/19/2023.    12-14 Month ICM trend:     Karie Soda, RN 04/23/2023 1:46 PM

## 2023-04-28 ENCOUNTER — Other Ambulatory Visit: Payer: Self-pay | Admitting: Internal Medicine

## 2023-05-06 ENCOUNTER — Ambulatory Visit (INDEPENDENT_AMBULATORY_CARE_PROVIDER_SITE_OTHER): Payer: Medicare Other

## 2023-05-06 DIAGNOSIS — I255 Ischemic cardiomyopathy: Secondary | ICD-10-CM

## 2023-05-06 LAB — CUP PACEART REMOTE DEVICE CHECK
Battery Remaining Longevity: 69 mo
Battery Voltage: 3 V
Brady Statistic AP VP Percent: 98.97 %
Brady Statistic AP VS Percent: 0.97 %
Brady Statistic AS VP Percent: 0.06 %
Brady Statistic AS VS Percent: 0 %
Brady Statistic RA Percent Paced: 99.92 %
Brady Statistic RV Percent Paced: 96.68 %
Date Time Interrogation Session: 20240516031703
HighPow Impedance: 45 Ohm
HighPow Impedance: 55 Ohm
Implantable Lead Connection Status: 753985
Implantable Lead Connection Status: 753985
Implantable Lead Connection Status: 753985
Implantable Lead Implant Date: 20070615
Implantable Lead Implant Date: 20120813
Implantable Lead Implant Date: 20120813
Implantable Lead Location: 753858
Implantable Lead Location: 753859
Implantable Lead Location: 753860
Implantable Lead Model: 4396
Implantable Lead Model: 5076
Implantable Lead Model: 7121
Implantable Pulse Generator Implant Date: 20230816
Lead Channel Impedance Value: 247 Ohm
Lead Channel Impedance Value: 342 Ohm
Lead Channel Impedance Value: 342 Ohm
Lead Channel Impedance Value: 380 Ohm
Lead Channel Impedance Value: 513 Ohm
Lead Channel Impedance Value: 817 Ohm
Lead Channel Pacing Threshold Amplitude: 1.125 V
Lead Channel Pacing Threshold Pulse Width: 0.4 ms
Lead Channel Sensing Intrinsic Amplitude: 0.25 mV
Lead Channel Sensing Intrinsic Amplitude: 0.25 mV
Lead Channel Setting Pacing Amplitude: 1.5 V
Lead Channel Setting Pacing Amplitude: 2 V
Lead Channel Setting Pacing Amplitude: 2.25 V
Lead Channel Setting Pacing Pulse Width: 0.4 ms
Lead Channel Setting Pacing Pulse Width: 1 ms
Lead Channel Setting Sensing Sensitivity: 0.3 mV
Zone Setting Status: 755011

## 2023-05-20 NOTE — Progress Notes (Signed)
Remote ICD transmission.   

## 2023-05-24 ENCOUNTER — Ambulatory Visit: Payer: Medicare Other | Attending: Internal Medicine

## 2023-05-24 DIAGNOSIS — I5032 Chronic diastolic (congestive) heart failure: Secondary | ICD-10-CM

## 2023-05-24 DIAGNOSIS — Z95810 Presence of automatic (implantable) cardiac defibrillator: Secondary | ICD-10-CM | POA: Diagnosis not present

## 2023-05-26 ENCOUNTER — Telehealth: Payer: Self-pay

## 2023-05-26 NOTE — Telephone Encounter (Signed)
Remote ICM transmission received.  Attempted call to patient regarding ICM remote transmission and left detailed message per DPR.  Left ICM phone number and advised to return call for any fluid symptoms or questions. Next ICM remote transmission scheduled 06/28/2023.    

## 2023-05-26 NOTE — Progress Notes (Signed)
EPIC Encounter for ICM Monitoring  Patient Name: [name]. is a 82 y.o. male Date: 05/26/2023 Primary Care Physican: Kandyce Rud, MD Primary Cardiologist: Graciela Husbands Electrophysiologist: Joycelyn Schmid Pacing: 97.2%         11/24/2022 Weight: 178.2 lbs 01/06/2023 Weight: 177-178 lbs 02/09/2023 Weight: 180 lbs 04/23/2023 Weight: 181 lbs   Time in AT/AF 0.0 hr/day (0.0%)                                                            Attempted call to patient and unable to reach.  Left detailed message per DPR regarding transmission. Transmission reviewed.     Optivol thoracic impedance suggesting normal fluid levels with exception of possible fluid accumulation from 5/11-5/15.   Prescribed:  Furosemide 80 mg Take 1 tablet (s) (80 mg total) by mouth every OTHER day.     Labs: 01/19/2023 Creatinine 0.9, BUN 24, Potassium 4.0, Sodium 141, GFR 86 A complete set of results can be found in Results Review.   Recommendations:  Left voice mail with ICM number and encouraged to call if experiencing any fluid symptoms.   Follow-up plan: ICM clinic phone appointment on 06/28/2023.   91 day device clinic remote transmission 08/05/2023.     EP/Cardiology Office Visits:  Recall 08/14/2023 with Dr. Graciela Husbands.     Copy of ICM check sent to Dr. Graciela Husbands.  3 month ICM trend: 05/24/2023.    12-14 Month ICM trend:     Karie Soda, RN 05/26/2023 4:01 PM

## 2023-06-28 ENCOUNTER — Ambulatory Visit: Payer: Medicare Other | Attending: Internal Medicine

## 2023-06-28 DIAGNOSIS — I5032 Chronic diastolic (congestive) heart failure: Secondary | ICD-10-CM | POA: Diagnosis not present

## 2023-06-28 DIAGNOSIS — Z95810 Presence of automatic (implantable) cardiac defibrillator: Secondary | ICD-10-CM | POA: Diagnosis not present

## 2023-07-02 NOTE — Progress Notes (Signed)
EPIC Encounter for ICM Monitoring  Patient Name: [name]. is a 82 y.o. male Date: 07/02/2023 Primary Care Physican: Ronnie Rud, MD Primary Cardiologist: [name] Electrophysiologist: [name] Pacing: 98.6%         02/09/2023 Weight: 180 lbs 04/23/2023 Weight: 181 lbs 07/02/2023 Weight: 179-180 lbs   Time in AT/AF 0.0 hr/day (0.0%)                                                            Spoke with patient and heart failure questions reviewed.  Transmission results reviewed.  Pt asymptomatic for fluid accumulation.  Reports feeling well at this time and voices no complaints.     Optivol thoracic impedance suggesting normal fluid levels within the last month.   Prescribed:  Furosemide 80 mg Take 1 tablet (s) (80 mg total) by mouth every OTHER day.     Labs: 01/19/2023 Creatinine 0.9, BUN 24, Potassium 4.0, Sodium 141, GFR 86 A complete set of results can be found in Results Review.   Recommendations:  No changes and encouraged to call if experiencing any fluid symptoms.   Follow-up plan: ICM clinic phone appointment on 08/02/2023.   91 day device clinic remote transmission 08/05/2023.     EP/Cardiology Office Visits:  Recall 08/14/2023 with Dr. Graciela Gray.     Copy of ICM check sent to Dr. Graciela Gray.  3 month ICM trend: 06/28/2023.    12-14 Month ICM trend:     Karie Soda, RN 07/02/2023 3:47 PM

## 2023-07-23 ENCOUNTER — Other Ambulatory Visit: Payer: Self-pay | Admitting: Physician Assistant

## 2023-07-23 DIAGNOSIS — N400 Enlarged prostate without lower urinary tract symptoms: Secondary | ICD-10-CM

## 2023-07-23 MED ORDER — DUTASTERIDE 0.5 MG PO CAPS
ORAL_CAPSULE | ORAL | 2 refills | Status: DC
Start: 2023-07-23 — End: 2024-08-28

## 2023-07-23 NOTE — Addendum Note (Signed)
Addended by: Consuella Lose on: 07/23/2023 10:31 AM   Modules accepted: Orders

## 2023-07-23 NOTE — Telephone Encounter (Signed)
Refill called in since e scribing system is down for the pharmacy.

## 2023-08-02 ENCOUNTER — Ambulatory Visit: Payer: Medicare Other | Attending: Internal Medicine

## 2023-08-02 DIAGNOSIS — Z95810 Presence of automatic (implantable) cardiac defibrillator: Secondary | ICD-10-CM | POA: Diagnosis not present

## 2023-08-02 DIAGNOSIS — I5032 Chronic diastolic (congestive) heart failure: Secondary | ICD-10-CM | POA: Diagnosis not present

## 2023-08-05 ENCOUNTER — Ambulatory Visit: Payer: Medicare Other

## 2023-08-05 DIAGNOSIS — I255 Ischemic cardiomyopathy: Secondary | ICD-10-CM

## 2023-08-05 LAB — CUP PACEART REMOTE DEVICE CHECK
Battery Remaining Longevity: 66 mo
Battery Voltage: 2.98 V
Brady Statistic AP VP Percent: 99.82 %
Brady Statistic AP VS Percent: 0.16 %
Brady Statistic AS VP Percent: 0.02 %
Brady Statistic AS VS Percent: 0 %
Brady Statistic RA Percent Paced: 99.97 %
Brady Statistic RV Percent Paced: 99.03 %
Date Time Interrogation Session: 20240815034904
HighPow Impedance: 49 Ohm
HighPow Impedance: 59 Ohm
Implantable Lead Connection Status: 753985
Implantable Lead Connection Status: 753985
Implantable Lead Connection Status: 753985
Implantable Lead Implant Date: 20070615
Implantable Lead Implant Date: 20120813
Implantable Lead Implant Date: 20120813
Implantable Lead Location: 753858
Implantable Lead Location: 753859
Implantable Lead Location: 753860
Implantable Lead Model: 4396
Implantable Lead Model: 5076
Implantable Lead Model: 7121
Implantable Pulse Generator Implant Date: 20230816
Lead Channel Impedance Value: 247 Ohm
Lead Channel Impedance Value: 342 Ohm
Lead Channel Impedance Value: 380 Ohm
Lead Channel Impedance Value: 399 Ohm
Lead Channel Impedance Value: 513 Ohm
Lead Channel Impedance Value: 836 Ohm
Lead Channel Pacing Threshold Amplitude: 1.25 V
Lead Channel Pacing Threshold Pulse Width: 0.4 ms
Lead Channel Sensing Intrinsic Amplitude: 0.25 mV
Lead Channel Sensing Intrinsic Amplitude: 0.25 mV
Lead Channel Setting Pacing Amplitude: 1.5 V
Lead Channel Setting Pacing Amplitude: 2 V
Lead Channel Setting Pacing Amplitude: 2.5 V
Lead Channel Setting Pacing Pulse Width: 0.4 ms
Lead Channel Setting Pacing Pulse Width: 1 ms
Lead Channel Setting Sensing Sensitivity: 0.3 mV
Zone Setting Status: 755011

## 2023-08-06 NOTE — Progress Notes (Signed)
EPIC Encounter for ICM Monitoring  Patient Name: [name]. is a 82 y.o. male Date: 08/06/2023 Primary Care Physican: Kandyce Rud, MD Primary Cardiologist: Graciela Husbands Electrophysiologist: Joycelyn Schmid Pacing: 99%         02/09/2023 Weight: 180 lbs 04/23/2023 Weight: 181 lbs 07/02/2023 Weight: 179-180 lbs 08/06/2023 Weight: 179 lbs   Time in AT/AF 0.0 hr/day (0.0%)                                                            Spoke with patient and heart failure questions reviewed.  Transmission results reviewed.  Pt asymptomatic for fluid accumulation.  Reports feeling well at this time and voices no complaints.     Optivol thoracic impedance suggesting normal fluid levels within the last month.   Prescribed:  Furosemide 80 mg Take 1 tablet (s) (80 mg total) by mouth every OTHER day.     Labs: 01/19/2023 Creatinine 0.9, BUN 24, Potassium 4.0, Sodium 141, GFR 86 A complete set of results can be found in Results Review.   Recommendations:  No changes and encouraged to call if experiencing any fluid symptoms.   Follow-up plan: ICM clinic phone appointment on 09/06/2023.   91 day device clinic remote transmission 10/25/2023.     EP/Cardiology Office Visits:   Advised to call office to make appt with Dr Graciela Husbands.  Recall 08/14/2023 with Dr. Graciela Husbands.     Copy of ICM check sent to Dr. Graciela Husbands.   3 month ICM trend: 08/02/2023.    12-14 Month ICM trend:     Karie Soda, RN 08/06/2023 1:25 PM

## 2023-08-17 NOTE — Progress Notes (Signed)
Remote ICD transmission.   

## 2023-08-30 ENCOUNTER — Other Ambulatory Visit: Payer: Self-pay | Admitting: Internal Medicine

## 2023-08-30 DIAGNOSIS — I480 Paroxysmal atrial fibrillation: Secondary | ICD-10-CM

## 2023-08-30 NOTE — Telephone Encounter (Signed)
Prescription refill request for Eliquis received. Indication:afib Last office visit:11/23 Scr:0.9  7/24 Age: 82 Weight:83.2  kg  Prescription refilled

## 2023-09-06 ENCOUNTER — Ambulatory Visit: Payer: Medicare Other | Attending: Internal Medicine

## 2023-09-06 DIAGNOSIS — Z95810 Presence of automatic (implantable) cardiac defibrillator: Secondary | ICD-10-CM

## 2023-09-06 DIAGNOSIS — I5032 Chronic diastolic (congestive) heart failure: Secondary | ICD-10-CM

## 2023-09-07 NOTE — Progress Notes (Unsigned)
Cardiology Office Note Date:  09/08/2023  Patient ID:  [name]., DOB [birth year], MRN [id number] PCP:  Kandyce Rud, MD  Cardiologist:  Sherryl Manges, MD Electrophysiologist: Sherryl Manges, MD    Chief Complaint: CRT-D follow-up  History of Present Illness: [name]. is a 82 y.o. male with PMH notable for syncope, HFmrEF, ICM, CRT-D, CAD s/p PCI, persis afib, HTN, T2DM; seen today for Sherryl Manges, MD for routine electrophysiology followup.  ICD initially implanted for HFrEF. Underwent CRT upgrade 2012. He last saw Dr. Graciela Husbands 10/2022, overall doing well having some DOE and edema that responded to diuretic. He obtained updated TTE in 12/2022 that showed reduced EF from 50-55% > 45-50%. Recommendation to start either spiro or jardiance/farxiga.   He follows monthly with ICM nurse  On follow-up today, he is doing "about the same". Denies palpitations, chest pain, chest pressure. He continues to take 80mg  lasix every other day - has robust UOP for 4-5h after taking. By taking diuretic every other day, allows him to have a good QOL on the off-days. Today is a non-diuretic day. He has some lower extremity edema today, but stable amount compared to other non-diuretic days. No change in exercise or activity tolerance. Diligently takes eliquis BID, no bleeding concerns.  He does not remember discussing spiro or jardiance/farxiga with Dr. Graciela Husbands, has not started either medication.   Device Information: MDT CRT-D, imp 05/2006; dx ICM, HFrEF CRT upgrade 2012 Gen change 07/2022  AAD History: none  Past Medical History:  Diagnosis Date   (HFpEF) heart failure with preserved ejection fraction (HCC)    a.) TTE 10/30/2010: EF 30-35%; sev anterior wall and apical HK. b.) TTE 11/20/2014: EF 50-55%; no RWMAs. c.) TTE 07/06/2019: EF 50-55%; no RWMAs.   Allergic rhinitis    Anemia    Aortic atherosclerosis (HCC)    Arthritis    Asthma    Atrial fibrillation (HCC)    a.)  CHA2DS2-VASc = 8 (age x 2, CHF, HTN, TIA x 2, prior MI, T2DM). b.) rate/rhythm maintained with bisoprolol; chronically anticoagulated with apixaban.   Biventricular ICD (implantable cardioverter-defibrillator) in place 2007   a.) Medtronic device placed in 2007 with CRT upgrade in 07/2011. Generator changed 02/2016.   BPH (benign prostatic hyperplasia)    CAD (coronary artery disease) 01/14/1994   a.) cardiac arrest; ROSC achieved. LHC 01/14/1994 showed 95% pLAD stenosis. PCI performed --> 3.5 x 15 mm JJIS to pLAD.  b.) PCI performed in 2007 in [place], [place]; type and location of stent unknown. c.) LHC 2012 --> patent LAD stent; nonobstructive disease.   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)    Myocardial infarction (HCC)    Sciatic nerve pain    Severe sinus bradycardia    a.) s/p Medtronic BiV ICD placement   Syncope    T2DM (type 2 diabetes mellitus) (HCC)    TIA (transient ischemic attack) 10/2010  Past Surgical History:  Procedure Laterality Date   CATARACT EXTRACTION, BILATERAL     COLONOSCOPY WITH PROPOFOL N/A 04/01/2018   Procedure: COLONOSCOPY WITH PROPOFOL;  Surgeon: Scot Jun, MD;  Location: [facility] ENDOSCOPY;  Service: Endoscopy;  Laterality: N/A;   CORONARY ANGIOPLASTY     LAD stent later additional stent placed 02/2006 branch of LAD   CYSTOSCOPY WITH INSERTION OF UROLIFT N/A 07/15/2020   Procedure: CYSTOSCOPY WITH INSERTION OF UROLIFT;  Surgeon: Vanna Scotland, MD;  Location: [facility] ORS;  Service:  Urology;  Laterality: N/A;   CYSTOSCOPY WITH INSERTION OF UROLIFT N/A 11/03/2021   Procedure: CYSTOSCOPY WITH INSERTION OF UROLIFT;  Surgeon: Vanna Scotland, MD;  Location: [facility] ORS;  Service: Urology;  Laterality: N/A;   CYSTOSCOPY WITH LITHOLAPAXY N/A 07/15/2020   Procedure: CYSTOSCOPY WITH LITHOLAPAXY;  Surgeon: Vanna Scotland, MD;  Location: [facility] ORS;  Service: Urology;  Laterality: N/A;   CYSTOSCOPY WITH LITHOLAPAXY N/A 11/03/2021   Procedure: CYSTOSCOPY WITH LITHOLAPAXY;  Surgeon: Vanna Scotland, MD;  Location: [facility] ORS;  Service: Urology;  Laterality: N/A;   EP IMPLANTABLE DEVICE N/A 03/18/2016   Procedure:  BiV ICD Generator Changeout;  Surgeon: Duke Salvia, MD;  Location: [facility] INVASIVE CV LAB;  Service: Cardiovascular;  Laterality: N/A;   ESOPHAGOGASTRODUODENOSCOPY (EGD) WITH PROPOFOL N/A 04/01/2018   Procedure: ESOPHAGOGASTRODUODENOSCOPY (EGD) WITH PROPOFOL;  Surgeon: Scot Jun, MD;  Location: [facility] ENDOSCOPY;  Service: Endoscopy;  Laterality: N/A;   ICD GENERATOR CHANGEOUT N/A 08/05/2022   Procedure: ICD GENERATOR CHANGEOUT;  Surgeon: Duke Salvia, MD;  Location: [facility] INVASIVE CV LAB;  Service: Cardiovascular;  Laterality: N/A;   Implantation of Medtronic dual-chamber cardioverter N/A 2007   Left Knee Replacement  12/21/2004   PROSTATE BIOPSY     TONSILLECTOMY  12/21/1946   TOTAL KNEE REVISION Left 06/20/2015   Procedure: TOTAL KNEE REVISION;  Surgeon: Kennedy Bucker, MD;  Location: [facility] ORS;  Service: Orthopedics;  Laterality: Left;    Current Outpatient Medications  Medication Instructions   acetaminophen (TYLENOL) 650 mg, Oral, 2 times daily   albuterol (VENTOLIN HFA) 108 (90 Base) MCG/ACT inhaler 1-2 puffs, Inhalation, Every 6 hours PRN   bisoprolol (ZEBETA) 5 mg, Oral, Daily   Calcium Citrate-Vitamin D (CITRACAL + D PO) 1 tablet, Oral, 2 times daily   cycloSPORINE (RESTASIS) 0.05 % ophthalmic emulsion 1 drop, Both Eyes, 2 times daily   dutasteride (AVODART) 0.5 MG  capsule TAKE ONE CAPSULE BY MOUTH EVERY 3 DAYS   ELIQUIS 5 MG TABS tablet TAKE ONE (1) TABLET BY MOUTH TWO TIMES PER DAY   furosemide (LASIX) 80 mg, Oral, Every other day   glipiZIDE (GLUCOTROL XL) 5 mg, Oral, Daily   latanoprost (XALATAN) 0.005 % ophthalmic solution 1 drop, Right Eye, Daily at bedtime   losartan (COZAAR) 50 mg, Oral, Daily   metFORMIN (GLUCOPHAGE-XR) 500 mg, Oral, 2 times daily   montelukast (SINGULAIR) 10 mg, Oral, Daily   Multiple Vitamin (MULTIVITAMIN) tablet 1 tablet, Oral, Daily,     omeprazole (PRILOSEC) 40 mg, Oral, Every morning   rosuvastatin (CRESTOR) 40 MG tablet TAKE 1 TABLET BY MOUTH DAILY   traZODone (DESYREL) 100 mg, Oral, Daily at bedtime    Social History:  The patient  reports that he has never smoked. He has never used smokeless tobacco. He reports current alcohol use of about 7.0 standard drinks of alcohol per week. He reports that he does not use drugs.   Family History:  The patient's family history includes Cirrhosis in his mother;  Emphysema in his father; Hypertension in his brother and son; Lung cancer in his father.  ROS:  Please see the history of present illness. All other systems are reviewed and otherwise negative.   PHYSICAL EXAM:  VS:  BP 126/70 (BP Location: Left Arm, Patient Position: Sitting, Cuff Size: Normal)   Pulse 61   Ht 5\' 5"  (1.651 m)   Wt 177 lb 6.4 oz (80.5 kg)   SpO2 98%   BMI 29.52 kg/m  BMI: Body mass index is 29.52 kg/m.  GEN- The patient is well appearing, alert and oriented x 3 today.   Lungs- Clear to ausculation bilaterally, normal work of breathing.  Heart- Regular rate and rhythm, no murmurs, rubs or gallops Extremities- 1-2+ peripheral edema, warm, dry Skin-  device pocket well-healed, no tethering   Device interrogation done today and reviewed by myself:  Battery 5.7 years Lead thresholds, impedence, sensing stable  Dependent down to VVI 30 VP 98% Significant increase in paroxysms of AFib in last  month No changes made today  EKG is ordered. Personal review of EKG from today shows:    EKG Interpretation Date/Time:  Wednesday September 08 2023 10:13:08 EDT Ventricular Rate:  61 PR Interval:    QRS Duration:  156 QT Interval:  464 QTC Calculation: 467 R Axis:   -68  Text Interpretation: Atrial fibrillation Ventricular-paced rhythm When compared with ECG of 06-Jun-2015 09:52, Vent. rate has decreased BY   4 BPM Reconfirmed by Sherie Don [phone]) on 09/08/2023 11:26:47 AM    Recent Labs: No results found for requested labs within last 365 days.  No results found for requested labs within last 365 days.   CrCl cannot be calculated (Patient's most recent lab result is older than the maximum 21 days allowed.).   Wt Readings from Last 3 Encounters:  09/08/23 177 lb 6.4 oz (80.5 kg)  11/17/22 183 lb 6.4 oz (83.2 kg)  08/05/22 180 lb (81.6 kg)     Additional studies reviewed include: Previous EP, cardiology notes.   TTE, 01/07/2023  1. Left ventricular ejection fraction, by estimation, is 45 to 50%. Left ventricular ejection fraction by 2D MOD biplane is 49.7 %. The left ventricle has mildly decreased function. The left ventricle demonstrates global hypokinesis. Left ventricular diastolic function could not be evaluated.   2. Right ventricular systolic function is normal. The right ventricular size is mildly enlarged. There is moderately elevated pulmonary artery systolic pressure. The estimated right ventricular systolic pressure is 46.9 mmHg.   3. Left atrial size was moderately dilated.   4. Right atrial size was severely dilated.   5. The mitral valve is grossly normal. Trivial mitral valve regurgitation.   6. The aortic valve is tricuspid. Aortic valve regurgitation is trivial. Aortic valve sclerosis is present, with no evidence of aortic valve stenosis. Aortic regurgitation PHT measures 700 msec.   7. The inferior vena cava is normal in size with <50% respiratory variability,  suggesting right atrial pressure of 8 mmHg.   Comparison(s): Changes from prior study are noted. 07/06/2019: LVEF 50-55%.   NM myocardial spect, 09/24/2020 Abnormal, probably low risk pharmacologic myocardial perfusion stress test. There is a small in size, mild in severity basal anterolateral defect that appears more severe on the rest images. This most likely represents artifact but cannot rule out an element of scar. There is no evidence of significant ischemia. The left ventricular ejection fraction is mildly decreased (46%). Attenuation correction CT demonstrates coronary stents and coronary artery calcification, as well  as aortic atherosclerosis and pacemaker wires. Sensitivity and specificity of the study degraded by artifact.  TTE, 07/06/2019  1. The left ventricle has low normal systolic function, with an ejection fraction of 50-55%. The cavity size was normal. Left ventricular diastolic Doppler parameters are indeterminate.   2. The right ventricle has normal systolic function. The cavity was normal. There is no increase in right ventricular wall thickness. Unable to estimate RVSP   3. Left atrial size was mild-moderately dilated.   ASSESSMENT AND PLAN:  #) persis AFib Significant increase in afib burden Patient asymptomatic without palpitations, change in exercise capacity, or increased edema Long discussion regarding whether to implement AAD vs proceed with ablation Given asymptomatic, will first update echo. If LVEF has reduced, will implement AAD +/- ablation  #) Hypercoag d/t persis afib CHA2DS2-VASc Score = 8 [CHF History: 1, HTN History: 1, Diabetes History: 1, Stroke History: 2, Vascular Disease History: 1, Age Score: 2, Gender Score: 0].  Therefore, the patient's annual risk of stroke is 10.8 %.    Stroke ppx - 5mg  eliquis BID, appropriately dosed No bleeding concerns  #) HFmrEF, previously reduced > improved NYHA II symptoms, activity mostly limited by back pain Most  recent echo with LVEF 45-50%, previously 50-55% Update echo as above Update BMP today - if K stable, will initiate spiro  - if K elevated, will initiate jardiance Continue losartan 50mg  daily Continue 80mg  lasix every other day   #) MDT CRT-D in situ Device functioning well, see paceart for details VP 98%      Current medicines are reviewed at length with the patient today.   The patient does not have concerns regarding his medicines.  The following changes were made today:  none  Labs/ tests ordered today include:  Orders Placed This Encounter  Procedures   Basic metabolic panel   Magnesium   EKG 12-Lead   ECHOCARDIOGRAM COMPLETE     Disposition: Follow up with Dr. Graciela Husbands   4-6 weeks  after echo   Signed, Sherie Don, NP  09/08/23  12:18 PM  Electrophysiology [facility]

## 2023-09-08 ENCOUNTER — Ambulatory Visit: Payer: Medicare Other | Attending: Cardiology | Admitting: Cardiology

## 2023-09-08 ENCOUNTER — Encounter: Payer: Self-pay | Admitting: Cardiology

## 2023-09-08 VITALS — BP 126/70 | HR 61 | Ht 65.0 in | Wt 177.4 lb

## 2023-09-08 DIAGNOSIS — I5032 Chronic diastolic (congestive) heart failure: Secondary | ICD-10-CM | POA: Insufficient documentation

## 2023-09-08 DIAGNOSIS — I4891 Unspecified atrial fibrillation: Secondary | ICD-10-CM | POA: Diagnosis present

## 2023-09-08 DIAGNOSIS — D6869 Other thrombophilia: Secondary | ICD-10-CM | POA: Insufficient documentation

## 2023-09-08 DIAGNOSIS — Z95810 Presence of automatic (implantable) cardiac defibrillator: Secondary | ICD-10-CM | POA: Diagnosis present

## 2023-09-08 LAB — CUP PACEART INCLINIC DEVICE CHECK
Date Time Interrogation Session: 20240918123729
Implantable Lead Connection Status: 753985
Implantable Lead Connection Status: 753985
Implantable Lead Connection Status: 753985
Implantable Lead Implant Date: 20070615
Implantable Lead Implant Date: 20120813
Implantable Lead Implant Date: 20120813
Implantable Lead Location: 753858
Implantable Lead Location: 753859
Implantable Lead Location: 753860
Implantable Lead Model: 4396
Implantable Lead Model: 5076
Implantable Lead Model: 7121
Implantable Pulse Generator Implant Date: 20230816

## 2023-09-08 NOTE — Patient Instructions (Signed)
Medication Instructions:  The current medical regimen is effective;  continue present plan and medications.  *If you need a refill on your cardiac medications before your next appointment, please call your pharmacy*   Lab Work: Your provider would like for you to have following labs drawn today BMET, MAG.   If you have labs (blood work) drawn today and your tests are completely normal, you will receive your results only by: MyChart Message (if you have MyChart) OR A paper copy in the mail If you have any lab test that is abnormal or we need to change your treatment, we will call you to review the results.   Testing/Procedures:  Your physician has requested that you have an echocardiogram. Echocardiography is a painless test that uses sound waves to create images of your heart. It provides your doctor with information about the size and shape of your heart and how well your heart's chambers and valves are working.   You may receive an ultrasound enhancing agent through an IV if needed to better visualize your heart during the echo. This procedure takes approximately one hour.  There are no restrictions for this procedure.  This will take place at [address], [place] [address]    Follow-Up: At [facility], you and your health needs are our priority.  As part of our continuing mission to provide you with exceptional heart care, we have created designated Provider Care Teams.  These Care Teams include your primary Cardiologist (physician) and Advanced Practice Providers (APPs -  Physician Assistants and Nurse Practitioners) who all work together to provide you with the care you need, when you need it.  We recommend signing up for the patient portal called "MyChart".  Sign up information is provided on this After Visit Summary.  MyChart is used to connect with patients for Virtual Visits (Telemedicine).  Patients are able to view lab/test  results, encounter notes, upcoming appointments, etc.  Non-urgent messages can be sent to your provider as well.   To learn more about what you can do with MyChart, go to [email or web address].    Your next appointment:   After ECHO (4-6 weeks)   Provider:   Sherryl Manges, MD

## 2023-09-09 ENCOUNTER — Other Ambulatory Visit: Payer: Self-pay

## 2023-09-09 ENCOUNTER — Telehealth: Payer: Self-pay

## 2023-09-09 DIAGNOSIS — I5032 Chronic diastolic (congestive) heart failure: Secondary | ICD-10-CM

## 2023-09-09 LAB — BASIC METABOLIC PANEL
BUN/Creatinine Ratio: 22 (ref 10–24)
BUN: 20 mg/dL (ref 8–27)
CO2: 24 mmol/L (ref 20–29)
Calcium: 10.1 mg/dL (ref 8.6–10.2)
Chloride: 102 mmol/L (ref 96–106)
Creatinine, Ser: 0.91 mg/dL (ref 0.76–1.27)
Glucose: 124 mg/dL — ABNORMAL HIGH (ref 70–99)
Potassium: 4.3 mmol/L (ref 3.5–5.2)
Sodium: 143 mmol/L (ref 134–144)
eGFR: 85 mL/min/{1.73_m2} (ref >59)

## 2023-09-09 LAB — MAGNESIUM: Magnesium: 2 mg/dL (ref 1.6–2.3)

## 2023-09-09 MED ORDER — SPIRONOLACTONE 25 MG PO TABS: 25.0000 mg | ORAL_TABLET | Freq: Every day | ORAL | 3 refills | Status: DC

## 2023-09-09 NOTE — Telephone Encounter (Signed)
Remote ICM transmission received.  Attempted call to patient regarding ICM remote transmission and left detailed message per DPR.  Left ICM phone number and advised to return call for any fluid symptoms or questions. Next ICM remote transmission scheduled 10/11/2023.

## 2023-09-09 NOTE — Progress Notes (Signed)
EPIC Encounter for ICM Monitoring  Patient Name: [name]. is a 82 y.o. male Date: 09/09/2023 Primary Care Physican: Kandyce Rud, MD Primary Cardiologist: Graciela Husbands Electrophysiologist: Joycelyn Schmid Pacing: 97%         02/09/2023 Weight: 180 lbs 04/23/2023 Weight: 181 lbs 07/02/2023 Weight: 179-180 lbs 08/06/2023 Weight: 179 lbs   Since 05-Aug-2023 AT/AF  1943 Time in AT/AF  7.9 hr/day (32.8%) Longest AT/AF 5 hours Time in AT/AF 0.0 hr/day (0.0%)                                                            Attempted call to patient and unable to reach.  Left detailed message per DPR regarding transmission. Transmission reviewed.    Optivol thoracic impedance suggesting normal fluid levels within the last month.   Seen in Office by Sherie Don, PA for increased AT/AF burden 9/18.    Prescribed:  Furosemide 80 mg Take 1 tablet (s) (80 mg total) by mouth every OTHER day.     Labs: 01/19/2023 Creatinine 0.9, BUN 24, Potassium 4.0, Sodium 141, GFR 86 A complete set of results can be found in Results Review.   Recommendations:  Left voice mail with ICM number and encouraged to call if experiencing any fluid symptoms.   Follow-up plan: ICM clinic phone appointment on 10/11/2023.   91 day device clinic remote transmission 10/25/2023.     EP/Cardiology Office Visits:   10/11/2023 with Dr. Graciela Husbands.     Copy of ICM check sent to Dr. Graciela Husbands.   3 month ICM trend: 09/06/2023.    12-14 Month ICM trend:     Karie Soda, RN 09/09/2023 10:34 AM

## 2023-09-21 ENCOUNTER — Other Ambulatory Visit: Payer: Self-pay

## 2023-09-21 DIAGNOSIS — I5032 Chronic diastolic (congestive) heart failure: Secondary | ICD-10-CM

## 2023-09-23 ENCOUNTER — Other Ambulatory Visit: Payer: Self-pay

## 2023-09-23 ENCOUNTER — Ambulatory Visit: Payer: Medicare Other

## 2023-09-23 ENCOUNTER — Telehealth: Payer: Self-pay | Admitting: Cardiology

## 2023-09-23 ENCOUNTER — Ambulatory Visit: Payer: Medicare Other | Attending: Cardiology

## 2023-09-23 DIAGNOSIS — I4891 Unspecified atrial fibrillation: Secondary | ICD-10-CM | POA: Diagnosis present

## 2023-09-23 DIAGNOSIS — I5032 Chronic diastolic (congestive) heart failure: Secondary | ICD-10-CM

## 2023-09-23 LAB — ECHOCARDIOGRAM COMPLETE
Area-P 1/2: 3.03 cm2
S' Lateral: 3.3 cm

## 2023-09-23 NOTE — Telephone Encounter (Signed)
Noted. Thanks.

## 2023-09-23 NOTE — Telephone Encounter (Signed)
I discussed patient's AFib with Dr. Graciela Husbands, there is some concern that the patient's device is undersensing the AFib episodes and he has been in 1 persistent afib episode as opposed to paroxysmal episodes.  Recommended 3 day ambulatory monitor to further eval.  Patient is agreeable to this. Advised this would be mailed to him.

## 2023-09-24 LAB — BASIC METABOLIC PANEL
BUN/Creatinine Ratio: 21 (ref 10–24)
BUN: 22 mg/dL (ref 8–27)
CO2: 25 mmol/L (ref 20–29)
Calcium: 9.6 mg/dL (ref 8.6–10.2)
Chloride: 102 mmol/L (ref 96–106)
Creatinine, Ser: 1.03 mg/dL (ref 0.76–1.27)
Glucose: 112 mg/dL — ABNORMAL HIGH (ref 70–99)
Potassium: 4.5 mmol/L (ref 3.5–5.2)
Sodium: 140 mmol/L (ref 134–144)
eGFR: 73 mL/min/{1.73_m2} (ref >59)

## 2023-09-28 DIAGNOSIS — I4891 Unspecified atrial fibrillation: Secondary | ICD-10-CM

## 2023-10-11 ENCOUNTER — Ambulatory Visit: Payer: Medicare Other | Attending: Internal Medicine | Admitting: Internal Medicine

## 2023-10-11 ENCOUNTER — Ambulatory Visit: Payer: Medicare Other | Attending: Internal Medicine

## 2023-10-11 ENCOUNTER — Encounter: Payer: Self-pay | Admitting: Internal Medicine

## 2023-10-11 VITALS — BP 121/76 | HR 61 | Ht 65.0 in | Wt 181.2 lb

## 2023-10-11 DIAGNOSIS — I4891 Unspecified atrial fibrillation: Secondary | ICD-10-CM | POA: Diagnosis present

## 2023-10-11 DIAGNOSIS — Z95810 Presence of automatic (implantable) cardiac defibrillator: Secondary | ICD-10-CM

## 2023-10-11 DIAGNOSIS — I5032 Chronic diastolic (congestive) heart failure: Secondary | ICD-10-CM | POA: Diagnosis not present

## 2023-10-11 LAB — CUP PACEART INCLINIC DEVICE CHECK
Battery Remaining Longevity: 63 mo
Battery Voltage: 2.97 V
Brady Statistic AP VP Percent: 12.2 %
Brady Statistic AP VS Percent: 0.01 %
Brady Statistic AS VP Percent: 86.7 %
Brady Statistic AS VS Percent: 1.09 %
Brady Statistic RA Percent Paced: 10.6 %
Brady Statistic RV Percent Paced: 98.88 %
Date Time Interrogation Session: 20241021085928
HighPow Impedance: 52 Ohm
HighPow Impedance: 66 Ohm
Implantable Lead Connection Status: 753985
Implantable Lead Connection Status: 753985
Implantable Lead Connection Status: 753985
Implantable Lead Implant Date: 20070615
Implantable Lead Implant Date: 20120813
Implantable Lead Implant Date: 20120813
Implantable Lead Location: 753858
Implantable Lead Location: 753859
Implantable Lead Location: 753860
Implantable Lead Model: 4396
Implantable Lead Model: 5076
Implantable Lead Model: 7121
Implantable Pulse Generator Implant Date: 20230816
Lead Channel Impedance Value: 266 Ohm
Lead Channel Impedance Value: 380 Ohm
Lead Channel Impedance Value: 380 Ohm
Lead Channel Impedance Value: 456 Ohm
Lead Channel Impedance Value: 570 Ohm
Lead Channel Impedance Value: 893 Ohm
Lead Channel Pacing Threshold Amplitude: 1.25 V
Lead Channel Pacing Threshold Pulse Width: 0.4 ms
Lead Channel Sensing Intrinsic Amplitude: 0.375 mV
Lead Channel Sensing Intrinsic Amplitude: 0.5 mV
Lead Channel Setting Pacing Amplitude: 2 V
Lead Channel Setting Pacing Amplitude: 2 V
Lead Channel Setting Pacing Amplitude: 2.5 V
Lead Channel Setting Pacing Pulse Width: 0.4 ms
Lead Channel Setting Pacing Pulse Width: 1 ms
Lead Channel Setting Sensing Sensitivity: 0.3 mV
Zone Setting Status: 755011

## 2023-10-11 NOTE — Progress Notes (Signed)
Patient Care Team: Kandyce Rud, MD as PCP - General (Family Medicine) Duke Salvia, MD as PCP - Cardiology (Cardiology) Duke Salvia, MD as PCP - Electrophysiology (Cardiology) [name], MD (Unknown Physician Specialty)   HPI  [name]. is a 82 y.o. male Seen in followup for ICD implantation initially for syncope in the setting of depressed left ventricular function and ischemic heart disease with remote LAD stenting.  He had a 6949-lead. He reached ERI summer 2012 and underwent CRT upgrade with new defibrillator lead insertion generator replacement 3/17 and 8/23   Atrial fibrillation persistent-- anticoagulation with Apixoban no bleeding   The patient denies chest pain, shortness of breath, nocturnal dyspnea, orthopnea or peripheral edema.  There have been no palpitations, lightheadedness or syncope.  Complaints none, not withstanding afib.   Saw SR-NP 9/24  Who started him on spiro.  Tolerating well.  There are also discussions to get an SGLT2   Event recorder demonstrated persistent atrial fibrillation with adequate rate control DATE TEST EF   2012 Cath  40 % Patent LAD stent  12/15 Echo   55-65 %   7/20 Echo  50-55%   10/21 Myoview  46% No Ischemia  10/24 Echo   40-45% RAE>LAE     Date K Cr Hgb LDL  6/18 4.3 0.9 14   2/19 4.1 0.9 12.5   3/21 4.6 0.92 13.8   2/22 4.2 0.8 12.2 76  7/22 4.7 0.8 11.2 53  1/23 4.5 0.8 11.8    8/23 4.6 0.82 11.7   10/24 4.5 1.03 11.6 (7/24)  53     Thromboembolic risk factors ( age  -2, HTN-1, TIA/CVA-2, Vasc disease -1, CHF -1) for a CHADSVASc Score of >=7     Past Medical History:  Diagnosis Date   (HFpEF) heart failure with preserved ejection fraction (HCC)    a.) TTE 10/30/2010: EF 30-35%; sev anterior wall and apical HK. b.) TTE 11/20/2014: EF 50-55%; no RWMAs. c.) TTE 07/06/2019: EF 50-55%; no RWMAs.   Allergic rhinitis    Anemia    Aortic atherosclerosis (HCC)    Arthritis    Asthma    Atrial  fibrillation (HCC)    a.) CHA2DS2-VASc = 8 (age x 2, CHF, HTN, TIA x 2, prior MI, T2DM). b.) rate/rhythm maintained with bisoprolol; chronically anticoagulated with apixaban.   Biventricular ICD (implantable cardioverter-defibrillator) in place 2007   a.) Medtronic device placed in 2007 with CRT upgrade in 07/2011. Generator changed 02/2016.   BPH (benign prostatic hyperplasia)    CAD (coronary artery disease) 01/14/1994   a.) cardiac arrest; ROSC achieved. LHC 01/14/1994 showed 95% pLAD stenosis. PCI performed --> 3.5 x 15 mm JJIS to pLAD.  b.) PCI performed in 2007 in [place], [place]; type and location of stent unknown. c.) LHC 2012 --> patent LAD stent; nonobstructive disease.   Cardiac arrest [facility])    Chronic lower back pain    Current use of long term anticoagulation    a.) apixaban   Dyspnea    PFT 12/19/10: FEV1 2.93(107%), FEV1% 72, TLC 8.02(138%), DLCO 110%, +BD   ED (erectile dysfunction)    Elevated PSA    Encysted hydrocele    GERD (gastroesophageal reflux disease)    High-grade heart block    a.) BiV ICD in place   History of kidney stones    Hyperlipidemia    Hypertension    Insomnia    Ischemic cardiomyopathy with implantable cardioverter-defibrillator (ICD)    Myocardial  infarction [facility])    Sciatic nerve pain    Severe sinus bradycardia    a.) s/p Medtronic BiV ICD placement   Syncope    T2DM (type 2 diabetes mellitus) (HCC)    TIA (transient ischemic attack) 10/2010    Past Surgical History:  Procedure Laterality Date   CATARACT EXTRACTION, BILATERAL     COLONOSCOPY WITH PROPOFOL N/A 04/01/2018   Procedure: COLONOSCOPY WITH PROPOFOL;  Surgeon: Scot Jun, MD;  Location: [facility] ENDOSCOPY;  Service: Endoscopy;  Laterality: N/A;   CORONARY ANGIOPLASTY     LAD stent later additional stent placed 02/2006 branch of LAD   CYSTOSCOPY WITH INSERTION OF UROLIFT N/A 07/15/2020   Procedure: CYSTOSCOPY WITH INSERTION OF UROLIFT;  Surgeon: Vanna Scotland, MD;  Location:  [facility] ORS;  Service: Urology;  Laterality: N/A;   CYSTOSCOPY WITH INSERTION OF UROLIFT N/A 11/03/2021   Procedure: CYSTOSCOPY WITH INSERTION OF UROLIFT;  Surgeon: Vanna Scotland, MD;  Location: [facility] ORS;  Service: Urology;  Laterality: N/A;   CYSTOSCOPY WITH LITHOLAPAXY N/A 07/15/2020   Procedure: CYSTOSCOPY WITH LITHOLAPAXY;  Surgeon: Vanna Scotland, MD;  Location: [facility] ORS;  Service: Urology;  Laterality: N/A;   CYSTOSCOPY WITH LITHOLAPAXY N/A 11/03/2021   Procedure: CYSTOSCOPY WITH LITHOLAPAXY;  Surgeon: Vanna Scotland, MD;  Location: [facility] ORS;  Service: Urology;  Laterality: N/A;   EP IMPLANTABLE DEVICE N/A 03/18/2016   Procedure:  BiV ICD Generator Changeout;  Surgeon: Duke Salvia, MD;  Location: [facility] INVASIVE CV LAB;  Service: Cardiovascular;  Laterality: N/A;   ESOPHAGOGASTRODUODENOSCOPY (EGD) WITH PROPOFOL N/A 04/01/2018   Procedure: ESOPHAGOGASTRODUODENOSCOPY (EGD) WITH PROPOFOL;  Surgeon: Scot Jun, MD;  Location: [facility] ENDOSCOPY;  Service: Endoscopy;  Laterality: N/A;   ICD GENERATOR CHANGEOUT N/A 08/05/2022   Procedure: ICD GENERATOR CHANGEOUT;  Surgeon: Duke Salvia, MD;  Location: [facility] INVASIVE CV LAB;  Service: Cardiovascular;  Laterality: N/A;   Implantation of Medtronic dual-chamber cardioverter N/A 2007   Left Knee Replacement  12/21/2004   PROSTATE BIOPSY     TONSILLECTOMY  12/21/1946   TOTAL KNEE REVISION Left 06/20/2015   Procedure: TOTAL KNEE REVISION;  Surgeon: Kennedy Bucker, MD;  Location: [facility] ORS;  Service: Orthopedics;  Laterality: Left;    Current Outpatient Medications  Medication Sig Dispense Refill   acetaminophen (TYLENOL) 650 MG CR tablet Take 650 mg by mouth 2 (two) times daily.     albuterol (VENTOLIN HFA) 108 (90 Base) MCG/ACT inhaler Inhale 1-2 puffs into the lungs every 6 (six) hours as needed for wheezing or shortness of breath.     bisoprolol (ZEBETA) 5 MG tablet Take 5 mg by mouth daily.     Calcium Citrate-Vitamin D (CITRACAL + D PO) Take 1  tablet by mouth 2 (two) times daily.     cycloSPORINE (RESTASIS) 0.05 % ophthalmic emulsion Place 1 drop into both eyes 2 (two) times daily.     dutasteride (AVODART) 0.5 MG capsule TAKE ONE CAPSULE BY MOUTH EVERY 3 DAYS 90 capsule 2   ELIQUIS 5 MG TABS tablet TAKE ONE (1) TABLET BY MOUTH TWO TIMES PER DAY 180 tablet 1   furosemide (LASIX) 80 MG tablet Take 1 tablet (80 mg total) by mouth every other day. 45 tablet 3   glipiZIDE (GLUCOTROL XL) 5 MG 24 hr tablet Take 5 mg by mouth daily.     latanoprost (XALATAN) 0.005 % ophthalmic solution Place 1 drop into the right eye at bedtime.      losartan (COZAAR) 50 MG tablet Take 50  mg by mouth daily.     metFORMIN (GLUCOPHAGE-XR) 500 MG 24 hr tablet Take 500 mg by mouth in the morning and at bedtime.      montelukast (SINGULAIR) 10 MG tablet Take 10 mg by mouth daily.      Multiple Vitamin (MULTIVITAMIN) tablet Take 1 tablet by mouth daily.     omeprazole (PRILOSEC) 40 MG capsule Take 40 mg by mouth every morning.     rosuvastatin (CRESTOR) 40 MG tablet TAKE 1 TABLET BY MOUTH DAILY 90 tablet 1   spironolactone (ALDACTONE) 25 MG tablet Take 1 tablet (25 mg total) by mouth daily. 90 tablet 3   traZODone (DESYREL) 100 MG tablet Take 100 mg by mouth at bedtime.     No current facility-administered medications for this visit.    Allergies  Allergen Reactions   Dorzolamide    Beta Adrenergic Blockers Other (See Comments)    Fatigue   Oxycodone Nausea And Vomiting   Sulfonamide Derivatives Nausea And Vomiting    Review of Systems negative except from HPI and PMH  Physical Exam BP 121/76 (BP Location: Left Arm, Patient Position: Sitting, Cuff Size: Normal)   Pulse 61   Ht 5\' 5"  (1.651 m)   Wt 181 lb 3.2 oz (82.2 kg)   SpO2 98%   BMI 30.15 kg/m  Well developed and well nourished in no acute distress HENT normal Neck supple with JVP-flat Clear Device pocket well healed; without hematoma or erythema.  There is no tethering  Regular rate and  rhythm, no murmur Abd-soft with active BS No Clubbing cyanosis  edema Skin-warm and dry A & Oriented  Grossly normal sensory and motor function  ECG Afib w v  pacing Rs lead V1; upright QRS lead 1 QRSD 144 msec   Device function is normal. Programming changes none  See Paceart for details      Assessment and  Plan \ Ischemic Cardiomyopathy -- prior LAD stenting interval normalization with some subsequent loss of function   Complete heart block    HFmrEF-chronic  Implantable defibrillator-CRT Medtronic   Atrial fibrillation persistent  Bradycardia-sinus  Hypertension  Hyperlipidemia  Anemia --chronic low grade    No symptoms of angina.  Continue Eliquis and statins.  LDL in range.  Moderate left ventricular dysfunction with minimal symptoms of heart failure.  Continue losartan, bisoprolol and spironolactone.  Will plan at next visit to initiate SGLT2 and reviewed today.  Atrial fibrillation is now persistent.  No clear symptoms.  Long discussion regarding restoration of rhythm and as relates to [place] that benefits accrued regardless of associated symptoms.  Will undertake cardioversion.   Blood pressure is well-controlled  Continues with low-grade anemia>> plan at next visit to check iron labs  I spent today reviewing her records including previous charts and labs, taking her history and face-to-face counseling,  discussing medications, and documentation

## 2023-10-11 NOTE — Patient Instructions (Addendum)
Medication Instructions:  The current medical regimen is effective;  continue present plan and medications.  *If you need a refill on your cardiac medications before your next appointment, please call your pharmacy*   Lab Work: Your provider would like for you to have following labs drawn today CBC, BMET.   If you have labs (blood work) drawn today and your tests are completely normal, you will receive your results only by: MyChart Message (if you have MyChart) OR A paper copy in the mail If you have any lab test that is abnormal or we need to change your treatment, we will call you to review the results.   Testing/Procedures: Your physician has requested that you have a Cardioversion. Electrical Cardioversion uses a jolt of electricity to your heart either through paddles or wired patches attached to your chest. This is a controlled, usually prescheduled, procedure. This procedure is done at the hospital and you are not awake during the procedure. You usually go home the day of the procedure. Please see the instruction sheet given to you today for more information.    Follow-Up: At [facility], you and your health needs are our priority.  As part of our continuing mission to provide you with exceptional heart care, we have created designated Provider Care Teams.  These Care Teams include your primary Cardiologist (physician) and Advanced Practice Providers (APPs -  Physician Assistants and Nurse Practitioners) who all work together to provide you with the care you need, when you need it.  We recommend signing up for the patient portal called "MyChart".  Sign up information is provided on this After Visit Summary.  MyChart is used to connect with patients for Virtual Visits (Telemedicine).  Patients are able to view lab/test results, encounter notes, upcoming appointments, etc.  Non-urgent messages can be sent to your provider as well.   To learn more about what you can do with  MyChart, go to [email or web address].    Your next appointment:   1 month post cardioversion  Provider:   Sherryl Manges, MD    Other Instructions      Dear [name].  You are scheduled for a Cardioversion on Thursday, October 31 with Dr. Mariah Milling.  Please arrive at the [facility] Entrance of [facility], [address], [place] [address] at 6:30 AM (This is 1 hour(s) prior to your procedure time).  Proceed to the Check-In Desk directly inside the entrance.  Procedure Parking: Use the entrance off of the [address] side of the hospital. Turn right upon entering and follow the driveway to parking that is directly in front of the [facility]. There is no valet parking available at this entrance, however there is an awning directly in front of the [facility] for drop off/ pick up for patients.   DIET:  Nothing to eat or drink after midnight except a sip of water with medications (see medication instructions below)  MEDICATION INSTRUCTIONS: !!IF ANY NEW MEDICATIONS ARE STARTED AFTER TODAY, PLEASE NOTIFY YOUR PROVIDER AS SOON AS POSSIBLE!!  FYI: Medications such as Semaglutide (Ozempic, [place]), Tirzepatide (Mounjaro, Zepbound), Dulaglutide (Trulicity), etc ("GLP1 agonists") AND Canagliflozin (Invokana), Dapagliflozin (Farxiga), Empagliflozin (Jardiance), Ertugliflozin (Steglatro), Bexagliflozin [employer]) or any combination with one of these drugs such as Invokamet (Canagliflozin/Metformin), Synjardy (Empagliflozin/Metformin), etc ("SGLT2 inhibitors") must be held around the time of a procedure. This is not a comprehensive list of all of these drugs. Please review all of your medications  and talk to your provider if you take any one of these. If you are not sure, ask your provider.     Continue taking your anticoagulant (blood thinner): Apixaban (Eliquis).  You will need to continue this after your procedure until you are told by your  provider that it is safe to stop.    LABS: CBC, BMET today   FYI:  For your safety, and to allow [place] to monitor your vital signs accurately during the surgery/procedure we request: If you have artificial nails, gel coating, SNS etc, please have those removed prior to your surgery/procedure. Not having the nail coverings /polish removed may result in cancellation or delay of your surgery/procedure.  You must have a responsible person to drive you home and stay in the waiting area during your procedure. Failure to do so could result in cancellation.  Bring your insurance cards.  *Special Note: Every effort is made to have your procedure done on time. Occasionally there are emergencies that occur at the hospital that may cause delays. Please be patient if a delay does occur.

## 2023-10-12 LAB — CBC
Hematocrit: 39.7 % (ref 37.5–51.0)
Hemoglobin: 12.2 g/dL — ABNORMAL LOW (ref 13.0–17.7)
MCH: 25.8 pg — ABNORMAL LOW (ref 26.6–33.0)
MCHC: 30.7 g/dL — ABNORMAL LOW (ref 31.5–35.7)
MCV: 84 fL (ref 79–97)
Platelets: 165 10*3/uL (ref 150–450)
RBC: 4.73 x10E6/uL (ref 4.14–5.80)
RDW: 14.9 % (ref 11.6–15.4)
WBC: 4.2 10*3/uL (ref 3.4–10.8)

## 2023-10-12 LAB — BASIC METABOLIC PANEL
BUN/Creatinine Ratio: 24 (ref 10–24)
BUN: 23 mg/dL (ref 8–27)
CO2: 25 mmol/L (ref 20–29)
Calcium: 10.2 mg/dL (ref 8.6–10.2)
Chloride: 101 mmol/L (ref 96–106)
Creatinine, Ser: 0.97 mg/dL (ref 0.76–1.27)
Glucose: 117 mg/dL — ABNORMAL HIGH (ref 70–99)
Potassium: 4.5 mmol/L (ref 3.5–5.2)
Sodium: 139 mmol/L (ref 134–144)
eGFR: 78 mL/min/{1.73_m2} (ref >59)

## 2023-10-14 NOTE — H&P (View-Only) (Signed)
EPIC Encounter for ICM Monitoring  Patient Name: [name]. is a 82 y.o. male Date: 10/14/2023 Primary Care Physican: Kandyce Rud, MD Primary Cardiologist: Graciela Husbands Electrophysiologist: Joycelyn Schmid Pacing: 98.9%         02/09/2023 Weight: 180 lbs 04/23/2023 Weight: 181 lbs 07/02/2023 Weight: 179-180 lbs 08/06/2023 Weight: 179 lbs   Since 05-Aug-2023 AT/AF  2253 Time in AT/AF   21.5 hr/day (89.7%) Longest AT/AF  8 hours                                                            Transmission reviewed.   Cardioversion scheduled for 10/31.   Optivol thoracic impedance suggesting normal fluid levels within the last month.   .    Prescribed:  Furosemide 80 mg Take 1 tablet (s) (80 mg total) by mouth every OTHER day.     Labs: 01/19/2023 Creatinine 0.9, BUN 24, Potassium 4.0, Sodium 141, GFR 86 A complete set of results can be found in Results Review.   Recommendations:  Recommendations given at 10/21 OV with Dr Graciela Husbands.     Follow-up plan: ICM clinic phone appointment on 11/22/2023.   91 day device clinic remote transmission 11/04/2023.     EP/Cardiology Office Visits:   11/23/2023 with Dr. Graciela Husbands.     Copy of ICM check sent to Dr. Graciela Husbands.  3 month ICM trend: 10/11/2023.    12-14 Month ICM trend:     Karie Soda, RN 10/14/2023 2:46 PM

## 2023-10-14 NOTE — Progress Notes (Signed)
EPIC Encounter for ICM Monitoring  Patient Name: [name]. is a 82 y.o. male Date: 10/14/2023 Primary Care Physican: Kandyce Rud, MD Primary Cardiologist: Graciela Husbands Electrophysiologist: Joycelyn Schmid Pacing: 98.9%         02/09/2023 Weight: 180 lbs 04/23/2023 Weight: 181 lbs 07/02/2023 Weight: 179-180 lbs 08/06/2023 Weight: 179 lbs   Since 05-Aug-2023 AT/AF  2253 Time in AT/AF   21.5 hr/day (89.7%) Longest AT/AF  8 hours                                                            Transmission reviewed.   Cardioversion scheduled for 10/31.   Optivol thoracic impedance suggesting normal fluid levels within the last month.   .    Prescribed:  Furosemide 80 mg Take 1 tablet (s) (80 mg total) by mouth every OTHER day.     Labs: 01/19/2023 Creatinine 0.9, BUN 24, Potassium 4.0, Sodium 141, GFR 86 A complete set of results can be found in Results Review.   Recommendations:  Recommendations given at 10/21 OV with Dr Graciela Husbands.     Follow-up plan: ICM clinic phone appointment on 11/22/2023.   91 day device clinic remote transmission 11/04/2023.     EP/Cardiology Office Visits:   11/23/2023 with Dr. Graciela Husbands.     Copy of ICM check sent to Dr. Graciela Husbands.  3 month ICM trend: 10/11/2023.    12-14 Month ICM trend:     Karie Soda, RN 10/14/2023 2:46 PM

## 2023-10-21 ENCOUNTER — Encounter
Admission: RE | Disposition: A | Discharge status: Home / Self Care | Payer: Self-pay | Source: Home / Self Care | Attending: Cardiovascular Disease

## 2023-10-21 ENCOUNTER — Other Ambulatory Visit: Payer: Self-pay

## 2023-10-21 ENCOUNTER — Ambulatory Visit: Payer: Medicare Other | Admitting: Certified Registered"

## 2023-10-21 ENCOUNTER — Ambulatory Visit: Admit: 2023-10-21 | Payer: Medicare Other | Admitting: Cardiovascular Disease

## 2023-10-21 ENCOUNTER — Encounter: Payer: Self-pay | Admitting: Cardiovascular Disease

## 2023-10-21 ENCOUNTER — Ambulatory Visit
Admission: RE | Admit: 2023-10-21 | Discharge: 2023-10-21 | Disposition: A | Discharge status: Home / Self Care | Payer: Medicare Other | Attending: Cardiovascular Disease | Admitting: Cardiovascular Disease

## 2023-10-21 DIAGNOSIS — Z7901 Long term (current) use of anticoagulants: Secondary | ICD-10-CM | POA: Insufficient documentation

## 2023-10-21 DIAGNOSIS — I5030 Unspecified diastolic (congestive) heart failure: Secondary | ICD-10-CM | POA: Insufficient documentation

## 2023-10-21 DIAGNOSIS — K219 Gastro-esophageal reflux disease without esophagitis: Secondary | ICD-10-CM | POA: Insufficient documentation

## 2023-10-21 DIAGNOSIS — I2510 Atherosclerotic heart disease of native coronary artery without angina pectoris: Secondary | ICD-10-CM | POA: Diagnosis not present

## 2023-10-21 DIAGNOSIS — I420 Dilated cardiomyopathy: Secondary | ICD-10-CM | POA: Diagnosis not present

## 2023-10-21 DIAGNOSIS — I4891 Unspecified atrial fibrillation: Secondary | ICD-10-CM

## 2023-10-21 DIAGNOSIS — I110 Hypertensive heart disease with heart failure: Secondary | ICD-10-CM | POA: Diagnosis not present

## 2023-10-21 HISTORY — PX: CARDIOVERSION: SHX1299

## 2023-10-21 LAB — GLUCOSE, CAPILLARY: Glucose-Capillary: 122 mg/dL — ABNORMAL HIGH (ref 70–99)

## 2023-10-21 SURGERY — CARDIOVERSION
Anesthesia: General

## 2023-10-21 SURGERY — Surgical Case
Anesthesia: *Unknown

## 2023-10-21 MED ORDER — PROPOFOL 10 MG/ML IV BOLUS
INTRAVENOUS | Status: AC
  Filled 2023-10-21: qty 20

## 2023-10-21 MED ORDER — PROPOFOL 10 MG/ML IV BOLUS
INTRAVENOUS | Status: DC | PRN
  Administered 2023-10-21: 30 mg via INTRAVENOUS
  Administered 2023-10-21: 50 mg via INTRAVENOUS

## 2023-10-21 MED ORDER — PROPOFOL 10 MG/ML IV BOLUS
INTRAVENOUS | Status: DC | PRN
  Administered 2023-10-21: 50 mg via INTRAVENOUS

## 2023-10-21 MED ORDER — SODIUM CHLORIDE 0.9 % IV SOLN: INTRAVENOUS | Status: DC

## 2023-10-21 NOTE — CV Procedure (Signed)
Cardioversion procedure note For atrial fibflutter, persistent  Procedure Details:  Consent: Risks of procedure as well as the alternatives and risks of each were explained to the (patient/caregiver).  Consent for procedure obtained.  Time Out: Verified patient identification, verified procedure, site/side was marked, verified correct patient position, special equipment/implants available, medications/allergies/relevent history reviewed, required imaging and test results available.  Performed  Patient placed on cardiac monitor, pulse oximetry, supplemental oxygen as necessary.   Sedation given: propofol IV, Dr. Randa Ngo Pacer pads placed anterior and posterior chest.   Cardioverted 1 time(s).   Cardioverted at  150J. Synchronized biphasic Converted to determine rhythm Ventricularly paced with periodic atrial pacing spikes, unable to exclude sinus  Evaluation: Findings: Post procedure EKG shows: Ventricularly paced rhythm EP contacted, ICD device interrogated showing continued atrial fib/flutter Complications: None Patient did tolerate procedure well.  Time Spent Directly with the Patient:  45 minutes   Dossie Arbour, M.D., Ph.D.

## 2023-10-21 NOTE — Anesthesia Preprocedure Evaluation (Signed)
Anesthesia Evaluation  Patient identified by MRN, date of birth, ID band Patient awake    Reviewed: Allergy & Precautions, NPO status , Patient's Chart, lab work & pertinent test results  History of Anesthesia Complications Negative for: history of anesthetic complications  Airway Mallampati: III  TM Distance: <3 FB Neck ROM: full    Dental  (+) Chipped, Poor Dentition   Pulmonary neg shortness of breath, asthma    Pulmonary exam normal        Cardiovascular Exercise Tolerance: Good hypertension, + CAD, + Past MI and +CHF  + dysrhythmias Atrial Fibrillation + pacemaker  Rhythm:irregular Rate:Normal     Neuro/Psych TIA Neuromuscular disease  negative psych ROS   GI/Hepatic Neg liver ROS,GERD  Controlled,,  Endo/Other  negative endocrine ROSdiabetes    Renal/GU Renal disease  negative genitourinary   Musculoskeletal   Abdominal   Peds  Hematology negative hematology ROS (+)   Anesthesia Other Findings Past Medical History: No date: (HFpEF) heart failure with preserved ejection fraction (HCC)     Comment:  a.) TTE 10/30/2010: EF 30-35%; sev anterior wall and               apical HK. b.) TTE 11/20/2014: EF 50-55%; no RWMAs. c.)               TTE 07/06/2019: EF 50-55%; no RWMAs. No date: Allergic rhinitis No date: Anemia No date: Aortic atherosclerosis (HCC) No date: Arthritis No date: Asthma No date: Atrial fibrillation (HCC)     Comment:  a.) CHA2DS2-VASc = 8 (age x 2, CHF, HTN, TIA x 2, prior               MI, T2DM). b.) rate/rhythm maintained with bisoprolol;               chronically anticoagulated with apixaban. 2007: Biventricular ICD (implantable cardioverter-defibrillator) in  place     Comment:  a.) Medtronic device placed in 2007 with CRT upgrade in               07/2011. Generator changed 02/2016. No date: BPH (benign prostatic hyperplasia) 01/14/1994: CAD (coronary artery disease)     Comment:   a.) cardiac arrest; ROSC achieved. LHC 01/14/1994 showed              95% pLAD stenosis. PCI performed --> 3.5 x 15 mm JJIS to               pLAD.  b.) PCI performed in 2007 in [place], [place]; type               and location of stent unknown. c.) LHC 2012 --> patent               LAD stent; nonobstructive disease. 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) No date: Myocardial infarction (HCC) No  date: Sciatic nerve pain No date: Severe sinus bradycardia     Comment:  a.) s/p Medtronic BiV ICD placement No date: Syncope No date: T2DM (type 2 diabetes mellitus) (HCC) 10/2010: TIA (transient ischemic attack)  Past Surgical History: No date: CATARACT EXTRACTION, BILATERAL 04/01/2018: COLONOSCOPY WITH PROPOFOL; N/A     Comment:  Procedure: COLONOSCOPY WITH PROPOFOL;  Surgeon: Scot Jun, MD;  Location: [facility] ENDOSCOPY;  Service:               Endoscopy;  Laterality: N/A; No date: CORONARY ANGIOPLASTY     Comment:  LAD stent later additional stent placed 02/2006 branch of              LAD 07/15/2020: CYSTOSCOPY WITH INSERTION OF UROLIFT; N/A     Comment:  Procedure: CYSTOSCOPY WITH INSERTION OF UROLIFT;                Surgeon: Vanna Scotland, MD;  Location: [facility] ORS;                Service: Urology;  Laterality: N/A; 11/03/2021: CYSTOSCOPY WITH INSERTION OF UROLIFT; N/A      Comment:  Procedure: CYSTOSCOPY WITH INSERTION OF UROLIFT;                Surgeon: Vanna Scotland, MD;  Location: [facility] ORS;                Service: Urology;  Laterality: N/A; 07/15/2020: CYSTOSCOPY WITH LITHOLAPAXY; N/A     Comment:  Procedure: CYSTOSCOPY WITH LITHOLAPAXY;  Surgeon:               Vanna Scotland, MD;  Location: [facility] ORS;  Service:               Urology;  Laterality: N/A; 11/03/2021: CYSTOSCOPY WITH LITHOLAPAXY; N/A     Comment:  Procedure: CYSTOSCOPY WITH LITHOLAPAXY;  Surgeon:               Vanna Scotland, MD;  Location: [facility] ORS;  Service:               Urology;  Laterality: N/A; 03/18/2016: EP IMPLANTABLE DEVICE; N/A     Comment:  Procedure:  BiV ICD Generator Changeout;  Surgeon:               Duke Salvia, MD;  Location: [facility] INVASIVE CV LAB;                Service: Cardiovascular;  Laterality: N/A; 04/01/2018: ESOPHAGOGASTRODUODENOSCOPY (EGD) WITH PROPOFOL; N/A     Comment:  Procedure: ESOPHAGOGASTRODUODENOSCOPY (EGD) WITH               PROPOFOL;  Surgeon: Scot Jun, MD;  Location:               [facility] ENDOSCOPY;  Service: Endoscopy;  Laterality: N/A; 08/05/2022: ICD GENERATOR CHANGEOUT; N/A     Comment:  Procedure: ICD GENERATOR CHANGEOUT;  Surgeon: Duke Salvia, MD;  Location: [facility] INVASIVE CV LAB;  Service:               Cardiovascular;  Laterality: N/A; 2007: Implantation of Medtronic dual-chamber cardioverter; N/A 12/21/2004: Left Knee Replacement No date: PROSTATE BIOPSY 12/21/1946: TONSILLECTOMY 06/20/2015: TOTAL KNEE REVISION; Left     Comment:  Procedure: TOTAL KNEE REVISION;  Surgeon: Kennedy Bucker,  MD;  Location: [facility] ORS;  Service: Orthopedics;                Laterality: Left;  BMI    Body Mass Index: 29.95 kg/m      Reproductive/Obstetrics negative OB ROS                              Anesthesia Physical Anesthesia Plan  ASA: 3  Anesthesia Plan: General   Post-op Pain  Management:    Induction: Intravenous  PONV Risk Score and Plan: Propofol infusion and TIVA  Airway Management Planned: Natural Airway and Nasal Cannula  Additional Equipment:   Intra-op Plan:   Post-operative Plan:   Informed Consent: I have reviewed the patients History and Physical, chart, labs and discussed the procedure including the risks, benefits and alternatives for the proposed anesthesia with the patient or authorized representative who has indicated his/her understanding and acceptance.     Dental Advisory Given  Plan Discussed with: Anesthesiologist, CRNA and Surgeon  Anesthesia Plan Comments: (Repeating the procedure from earlier today.  Patient and wife consented for risks of anesthesia including but not limited to:  - adverse reactions to medications - risk of airway placement if required - damage to eyes, teeth, lips or other oral mucosa - nerve damage due to positioning  - sore throat or hoarseness - Damage to heart, brain, nerves, lungs, other parts of body or loss of life  They voiced understanding and assent.)        Anesthesia Quick Evaluation

## 2023-10-21 NOTE — Transfer of Care (Signed)
Immediate Anesthesia Transfer of Care Note  Patient: [name]  Procedure(s) Performed: CARDIOVERSION  Patient Location:  spu  Anesthesia Type:General  Level of Consciousness: awake, alert , and oriented  Airway & Oxygen Therapy: Patient Spontanous Breathing and Patient connected to nasal cannula oxygen  Post-op Assessment: Report given to RN and Post -op Vital signs reviewed and stable  Post vital signs: Reviewed  Last Vitals:  Vitals Value Taken Time  BP 96/67 10/21/23 0740  Temp    Pulse 60 10/21/23 0743  Resp 15 10/21/23 0743  SpO2 97 % 10/21/23 0743    Last Pain:  Vitals:   10/21/23 0656  TempSrc: Oral  PainSc: 0-No pain         Complications: No notable events documented.

## 2023-10-21 NOTE — Anesthesia Postprocedure Evaluation (Signed)
Anesthesia Post Note  Patient: [name].  Procedure(s) Performed: CARDIOVERSION  Patient location during evaluation: Specials Recovery Anesthesia Type: General Level of consciousness: awake and alert Pain management: pain level controlled Vital Signs Assessment: post-procedure vital signs reviewed and stable Respiratory status: spontaneous breathing, nonlabored ventilation, respiratory function stable and patient connected to nasal cannula oxygen Cardiovascular status: blood pressure returned to baseline and stable Postop Assessment: no apparent nausea or vomiting Anesthetic complications: no   No notable events documented.   Last Vitals:  Vitals:   10/21/23 0811 10/21/23 0821  BP: 105/69 104/77  Pulse: (!) 58 60  Resp: 16 19  Temp:    SpO2: 97% 97%    Last Pain:  Vitals:   10/21/23 0800  TempSrc:   PainSc: 0-No pain                 Cleda Mccreedy Abhiraj Dozal

## 2023-10-21 NOTE — Progress Notes (Signed)
Darl Pikes, NP at bedside to assess rhythm

## 2023-10-21 NOTE — Anesthesia Preprocedure Evaluation (Signed)
Anesthesia Evaluation  Patient identified by MRN, date of birth, ID band Patient awake    Reviewed: Allergy & Precautions, NPO status , Patient's Chart, lab work & pertinent test results  History of Anesthesia Complications Negative for: history of anesthetic complications  Airway Mallampati: III  TM Distance: <3 FB Neck ROM: full    Dental  (+) Chipped, Poor Dentition   Pulmonary neg shortness of breath, asthma    Pulmonary exam normal        Cardiovascular Exercise Tolerance: Good hypertension, + CAD, + Past MI and +CHF  + dysrhythmias Atrial Fibrillation + pacemaker  Rhythm:irregular Rate:Normal     Neuro/Psych TIA Neuromuscular disease  negative psych ROS   GI/Hepatic Neg liver ROS,GERD  Controlled,,  Endo/Other  negative endocrine ROSdiabetes    Renal/GU Renal disease  negative genitourinary   Musculoskeletal   Abdominal   Peds  Hematology negative hematology ROS (+)   Anesthesia Other Findings Past Medical History: No date: (HFpEF) heart failure with preserved ejection fraction (HCC)     Comment:  a.) TTE 10/30/2010: EF 30-35%; sev anterior wall and               apical HK. b.) TTE 11/20/2014: EF 50-55%; no RWMAs. c.)               TTE 07/06/2019: EF 50-55%; no RWMAs. No date: Allergic rhinitis No date: Anemia No date: Aortic atherosclerosis (HCC) No date: Arthritis No date: Asthma No date: Atrial fibrillation (HCC)     Comment:  a.) CHA2DS2-VASc = 8 (age x 2, CHF, HTN, TIA x 2, prior               MI, T2DM). b.) rate/rhythm maintained with bisoprolol;               chronically anticoagulated with apixaban. 2007: Biventricular ICD (implantable cardioverter-defibrillator) in  place     Comment:  a.) Medtronic device placed in 2007 with CRT upgrade in               07/2011. Generator changed 02/2016. No date: BPH (benign prostatic hyperplasia) 01/14/1994: CAD (coronary artery disease)     Comment:   a.) cardiac arrest; ROSC achieved. LHC 01/14/1994 showed              95% pLAD stenosis. PCI performed --> 3.5 x 15 mm JJIS to               pLAD.  b.) PCI performed in 2007 in [place], [place]; type               and location of stent unknown. c.) LHC 2012 --> patent               LAD stent; nonobstructive disease. No date: Cardiac arrest [facility]) No date: Chronic lower back pain No date: Current use of long term anticoagulation     Comment:  a.) apixaban No date: Dyspnea     Comment:  PFT 12/19/10: FEV1 2.93(107%), FEV1% 72, TLC 8.02(138%),              DLCO 110%, +BD No date: ED (erectile dysfunction) No date: Elevated PSA No date: Encysted hydrocele No date: GERD (gastroesophageal reflux disease) No date: High-grade heart block     Comment:  a.) BiV ICD in place No date: History of kidney stones No date: Hyperlipidemia No date: Hypertension No date: Insomnia No date: 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) No date: Myocardial infarction (HCC) No  date: Sciatic nerve pain No date: Severe sinus bradycardia     Comment:  a.) s/p Medtronic BiV ICD placement No date: Syncope No date: T2DM (type 2 diabetes mellitus) (HCC) 10/2010: TIA (transient ischemic attack)  Past Surgical History: No date: CATARACT EXTRACTION, BILATERAL 04/01/2018: COLONOSCOPY WITH PROPOFOL; N/A     Comment:  Procedure: COLONOSCOPY WITH PROPOFOL;  Surgeon: Scot Jun, MD;  Location: [facility] ENDOSCOPY;  Service:               Endoscopy;  Laterality: N/A; No date: CORONARY ANGIOPLASTY     Comment:  LAD stent later additional stent placed 02/2006 branch of              LAD 07/15/2020: CYSTOSCOPY WITH INSERTION OF UROLIFT; N/A     Comment:  Procedure: CYSTOSCOPY WITH INSERTION OF UROLIFT;                Surgeon: Vanna Scotland, MD;  Location: [facility] ORS;                Service: Urology;  Laterality: N/A; 11/03/2021: CYSTOSCOPY WITH INSERTION OF UROLIFT; N/A      Comment:  Procedure: CYSTOSCOPY WITH INSERTION OF UROLIFT;                Surgeon: Vanna Scotland, MD;  Location: [facility] ORS;                Service: Urology;  Laterality: N/A; 07/15/2020: CYSTOSCOPY WITH LITHOLAPAXY; N/A     Comment:  Procedure: CYSTOSCOPY WITH LITHOLAPAXY;  Surgeon:               Vanna Scotland, MD;  Location: [facility] ORS;  Service:               Urology;  Laterality: N/A; 11/03/2021: CYSTOSCOPY WITH LITHOLAPAXY; N/A     Comment:  Procedure: CYSTOSCOPY WITH LITHOLAPAXY;  Surgeon:               Vanna Scotland, MD;  Location: [facility] ORS;  Service:               Urology;  Laterality: N/A; 03/18/2016: EP IMPLANTABLE DEVICE; N/A     Comment:  Procedure:  BiV ICD Generator Changeout;  Surgeon:               Duke Salvia, MD;  Location: [facility] INVASIVE CV LAB;                Service: Cardiovascular;  Laterality: N/A; 04/01/2018: ESOPHAGOGASTRODUODENOSCOPY (EGD) WITH PROPOFOL; N/A     Comment:  Procedure: ESOPHAGOGASTRODUODENOSCOPY (EGD) WITH               PROPOFOL;  Surgeon: Scot Jun, MD;  Location:               [facility] ENDOSCOPY;  Service: Endoscopy;  Laterality: N/A; 08/05/2022: ICD GENERATOR CHANGEOUT; N/A     Comment:  Procedure: ICD GENERATOR CHANGEOUT;  Surgeon: Duke Salvia, MD;  Location: [facility] INVASIVE CV LAB;  Service:               Cardiovascular;  Laterality: N/A; 2007: Implantation of Medtronic dual-chamber cardioverter; N/A 12/21/2004: Left Knee Replacement No date: PROSTATE BIOPSY 12/21/1946: TONSILLECTOMY 06/20/2015: TOTAL KNEE REVISION; Left     Comment:  Procedure: TOTAL KNEE REVISION;  Surgeon: Kennedy Bucker,  MD;  Location: [facility] ORS;  Service: Orthopedics;                Laterality: Left;  BMI    Body Mass Index: 29.95 kg/m      Reproductive/Obstetrics negative OB ROS                             Anesthesia Physical Anesthesia Plan  ASA: 3  Anesthesia Plan: General   Post-op Pain  Management:    Induction: Intravenous  PONV Risk Score and Plan: Propofol infusion and TIVA  Airway Management Planned: Natural Airway and Nasal Cannula  Additional Equipment:   Intra-op Plan:   Post-operative Plan:   Informed Consent: I have reviewed the patients History and Physical, chart, labs and discussed the procedure including the risks, benefits and alternatives for the proposed anesthesia with the patient or authorized representative who has indicated his/her understanding and acceptance.     Dental Advisory Given  Plan Discussed with: Anesthesiologist, CRNA and Surgeon  Anesthesia Plan Comments: (Patient consented for risks of anesthesia including but not limited to:  - adverse reactions to medications - risk of airway placement if required - damage to eyes, teeth, lips or other oral mucosa - nerve damage due to positioning  - sore throat or hoarseness - Damage to heart, brain, nerves, lungs, other parts of body or loss of life  Patient voiced understanding and assent.)       Anesthesia Quick Evaluation

## 2023-10-21 NOTE — Transfer of Care (Signed)
Immediate Anesthesia Transfer of Care Note  Patient: [name]  Procedure(s) Performed: CARDIOVERSION  Patient Location:  spu  Anesthesia Type:General  Level of Consciousness: awake and alert   Airway & Oxygen Therapy: Patient Spontanous Breathing and Patient connected to nasal cannula oxygen  Post-op Assessment: Report given to RN and Post -op Vital signs reviewed and stable  Post vital signs: Reviewed  Last Vitals:  Vitals Value Taken Time  BP 129/79 10/21/23 0905  Temp    Pulse 67 10/21/23 0909  Resp 18 10/21/23 0909  SpO2 99 % 10/21/23 0909    Last Pain:  Vitals:   10/21/23 0800  TempSrc:   PainSc: 0-No pain         Complications: No notable events documented.

## 2023-10-21 NOTE — CV Procedure (Signed)
Cardioversion procedure note For atrial fibrillation/flutter, persistent Case discussed with EP including Dr. Graciela Husbands Second attempt at cardioversion given persistent atrial fibs flutter,  patient resedated as below with reattempt at cardioversion  Procedure Details:  Consent: Risks of procedure as well as the alternatives and risks of each were explained to the (patient/caregiver).  Consent for procedure obtained.  Time Out: Verified patient identification, verified procedure, site/side was marked, verified correct patient position, special equipment/implants available, medications/allergies/relevent history reviewed, required imaging and test results available.  Performed  Patient placed on cardiac monitor, pulse oximetry, supplemental oxygen as necessary.   Sedation given: propofol IV, Dr. Randa Ngo Pacer pads placed anterior and posterior chest.   Cardioverted 4 time(s).  Pads anterior posteriorCardioverted at  150J. Synchronized biphasic Cardioverted at 200 J. Synchronized biphasic with chest compression Cardioverted at 200 J. Synchronized biphasic with chest compression Pads changed to anterolateral Cardioverted at 200 J. Synchronized biphasic with chest compression Minimal AV paced rhythm before resuming atrial fib/flutter   Evaluation: Findings: Post procedure EKG shows: Ventricularly paced, atrial flutter Complications: None Patient did tolerate procedure well.  Time Spent Directly with the Patient:  45 minutes   [name], M.D., Ph.D.

## 2023-10-21 NOTE — Anesthesia Postprocedure Evaluation (Signed)
Anesthesia Post Note  Patient: [name].  Procedure(s) Performed: CARDIOVERSION  Patient location during evaluation: Specials Recovery Anesthesia Type: General Level of consciousness: awake and alert Pain management: pain level controlled Vital Signs Assessment: post-procedure vital signs reviewed and stable Respiratory status: spontaneous breathing, nonlabored ventilation, respiratory function stable and patient connected to nasal cannula oxygen Cardiovascular status: blood pressure returned to baseline and stable Postop Assessment: no apparent nausea or vomiting Anesthetic complications: no   No notable events documented.   Last Vitals:  Vitals:   10/21/23 0931 10/21/23 0941  BP: 105/69 107/69  Pulse: 60 62  Resp: 17 12  Temp:    SpO2: 99% 98%    Last Pain:  Vitals:   10/21/23 0800  TempSrc:   PainSc: 0-No pain                 Cleda Mccreedy Kataryna Mcquilkin

## 2023-10-22 NOTE — Interval H&P Note (Signed)
History and Physical Interval Note:  10/22/2023 1:24 PM  [name].  has presented today for surgery, with the diagnosis of Cardioversion  Afib.  The various methods of treatment have been discussed with the patient and family. After consideration of risks, benefits and other options for treatment, the patient has consented to  Procedure(s): CARDIOVERSION (N/A) as a surgical intervention.  The patient's history has been reviewed, patient examined, no change in status, stable for surgery.  I have reviewed the patient's chart and labs.  Questions were answered to the patient's satisfaction.     [name]

## 2023-10-22 NOTE — H&P (Signed)
H&P Addendum, pre-cardioversion ° °Patient was seen and evaluated prior to -cardioversion procedure °Symptoms, prior testing details again confirmed with the patient °Patient examined, no significant change from prior exam °Lab work reviewed in detail personally by myself °Patient understands risk and benefit of the procedure,  °The risks (stroke, cardiac arrhythmias rarely resulting in the need for a temporary or permanent pacemaker, skin irritation or burns and complications associated with conscious sedation including aspiration, arrhythmia, respiratory failure and death), benefits (restoration of normal sinus rhythm) and alternatives of a direct current cardioversion were explained in detail °Patient willing to proceed. ° °Signed, °Tim Da Michelle, MD, Ph.D °[facility]  °

## 2023-10-25 ENCOUNTER — Encounter: Payer: Self-pay | Admitting: Cardiology

## 2023-10-25 ENCOUNTER — Ambulatory Visit: Payer: Medicare Other | Attending: Cardiology | Admitting: Cardiology

## 2023-10-25 VITALS — BP 118/74 | HR 69 | Ht 65.0 in | Wt 182.6 lb

## 2023-10-25 DIAGNOSIS — D6869 Other thrombophilia: Secondary | ICD-10-CM

## 2023-10-25 DIAGNOSIS — I4819 Other persistent atrial fibrillation: Secondary | ICD-10-CM | POA: Diagnosis present

## 2023-10-25 DIAGNOSIS — I4891 Unspecified atrial fibrillation: Secondary | ICD-10-CM | POA: Diagnosis not present

## 2023-10-25 DIAGNOSIS — Z95810 Presence of automatic (implantable) cardiac defibrillator: Secondary | ICD-10-CM | POA: Diagnosis present

## 2023-10-25 DIAGNOSIS — I5022 Chronic systolic (congestive) heart failure: Secondary | ICD-10-CM | POA: Diagnosis not present

## 2023-10-25 LAB — CUP PACEART INCLINIC DEVICE CHECK
Date Time Interrogation Session: 20241104135240
Implantable Lead Connection Status: 753985
Implantable Lead Connection Status: 753985
Implantable Lead Connection Status: 753985
Implantable Lead Implant Date: 20070615
Implantable Lead Implant Date: 20120813
Implantable Lead Implant Date: 20120813
Implantable Lead Location: 753858
Implantable Lead Location: 753859
Implantable Lead Location: 753860
Implantable Lead Model: 4396
Implantable Lead Model: 5076
Implantable Lead Model: 7121
Implantable Pulse Generator Implant Date: 20230816

## 2023-10-25 NOTE — Progress Notes (Signed)
Cardiology Office Note Date:  10/25/2023  Patient ID:  [name]., DOB 1941-03-30, MRN [id number] PCP:  Kandyce Rud, MD  Cardiologist:  Sherryl Manges, MD Electrophysiologist: Sherryl Manges, MD    Chief Complaint: DCCV follow-up  History of Present Illness: [name]. is a 82 y.o. male with PMH notable for syncope, HFmrEF, ICM, CRT-D, CAD s/p PCI, persis afib, HTN, T2DM; seen today for Sherryl Manges, MD for routine electrophysiology followup.  ICD initially implanted for HFrEF. Underwent CRT upgrade 2012. I saw him 08/2023 for routine follow-up, noted to have increased AFib burden via device, no symptomatic awareness. Some concern that patient's AFib was not parox but persis. Updated ambulatory monitor confirmed this and patient was scheduled for DCCV. He is s/p DCCV 10/30 with IRAF.   He presents today to discuss further afib mgmt.   He has no cardiac awareness, no chest pain, chest pressure or palpitations. No SOB. No cardiac activity limitations - he walks 5 miles a day, has to take breaks d/t back pain. No increased lower extremity edema.  No bleeding concerns on eliquis.  He follows monthly with ICM nurse  His wife joins for today's appt, whom I have met previously  Device Information: MDT CRT-D, imp 05/2006; dx ICM, HFrEF CRT upgrade 2012 Gen change 07/2022  AAD History: none  Past Medical History:  Diagnosis Date   (HFpEF) heart failure with preserved ejection fraction (HCC)    a.) TTE 10/30/2010: EF 30-35%; sev anterior wall and apical HK. b.) TTE 11/20/2014: EF 50-55%; no RWMAs. c.) TTE 07/06/2019: EF 50-55%; no RWMAs.   Allergic rhinitis    Anemia    Aortic atherosclerosis (HCC)    Arthritis    Asthma    Atrial fibrillation (HCC)    a.) CHA2DS2-VASc = 8 (age x 2, CHF, HTN, TIA x 2, prior MI, T2DM). b.) rate/rhythm maintained with bisoprolol; chronically anticoagulated with apixaban.   Biventricular ICD (implantable  cardioverter-defibrillator) in place 2007   a.) Medtronic device placed in 2007 with CRT upgrade in 07/2011. Generator changed 02/2016.   BPH (benign prostatic hyperplasia)    CAD (coronary artery disease) 01/14/1994   a.) cardiac arrest; ROSC achieved. LHC 01/14/1994 showed 95% pLAD stenosis. PCI performed --> 3.5 x 15 mm JJIS to pLAD.  b.) PCI performed in 2007 in [place], [place]; type and location of stent unknown. c.) LHC 2012 --> patent LAD stent; nonobstructive disease.   Cardiac arrest 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)    Myocardial infarction (HCC)    Sciatic nerve pain    Severe sinus bradycardia    a.) s/p Medtronic BiV ICD placement   Syncope    T2DM (type 2 diabetes mellitus) (HCC)    TIA (transient ischemic attack) 10/2010    Past Surgical History:  Procedure Laterality Date   CARDIOVERSION N/A 10/21/2023   Procedure: CARDIOVERSION;  Surgeon: Antonieta Iba, MD;  Location: [facility] ORS;  Service: Cardiovascular;  Laterality: N/A;   CARDIOVERSION N/A 10/21/2023   Procedure: CARDIOVERSION;  Surgeon: Antonieta Iba, MD;  Location: [facility] ORS;  Service: Cardiovascular;  Laterality: N/A;   CATARACT EXTRACTION, BILATERAL     COLONOSCOPY WITH PROPOFOL N/A 04/01/2018   Procedure: COLONOSCOPY WITH PROPOFOL;  Surgeon: Scot Jun, MD;  Location: [facility] ENDOSCOPY;  Service: Endoscopy;  Laterality: N/A;   CORONARY ANGIOPLASTY     LAD stent later additional stent placed 02/2006 branch of LAD   CYSTOSCOPY WITH INSERTION OF UROLIFT  N/A 07/15/2020   Procedure: CYSTOSCOPY WITH INSERTION OF UROLIFT;  Surgeon: Vanna Scotland, MD;  Location: [facility] ORS;  Service: Urology;  Laterality: N/A;   CYSTOSCOPY WITH INSERTION OF UROLIFT N/A 11/03/2021   Procedure: CYSTOSCOPY WITH INSERTION OF UROLIFT;  Surgeon: Vanna Scotland, MD;  Location: [facility] ORS;  Service: Urology;  Laterality: N/A;   CYSTOSCOPY WITH LITHOLAPAXY N/A 07/15/2020   Procedure: CYSTOSCOPY WITH LITHOLAPAXY;  Surgeon: Vanna Scotland, MD;  Location: [facility] ORS;  Service: Urology;  Laterality: N/A;   CYSTOSCOPY WITH LITHOLAPAXY N/A 11/03/2021   Procedure: CYSTOSCOPY WITH LITHOLAPAXY;  Surgeon: Vanna Scotland, MD;  Location: [facility] ORS;  Service: Urology;  Laterality: N/A;   EP IMPLANTABLE DEVICE N/A 03/18/2016   Procedure:  BiV ICD Generator Changeout;  Surgeon: Duke Salvia, MD;  Location: [facility] INVASIVE CV LAB;  Service: Cardiovascular;  Laterality: N/A;   ESOPHAGOGASTRODUODENOSCOPY (EGD) WITH PROPOFOL N/A 04/01/2018   Procedure: ESOPHAGOGASTRODUODENOSCOPY (EGD) WITH PROPOFOL;  Surgeon: Scot Jun, MD;  Location: [facility] ENDOSCOPY;  Service: Endoscopy;  Laterality: N/A;   ICD GENERATOR CHANGEOUT N/A 08/05/2022   Procedure: ICD GENERATOR CHANGEOUT;  Surgeon: Duke Salvia, MD;  Location: [facility] INVASIVE CV LAB;  Service: Cardiovascular;  Laterality: N/A;   Implantation of Medtronic dual-chamber cardioverter N/A 2007   Left Knee Replacement  12/21/2004   PROSTATE BIOPSY     TONSILLECTOMY  12/21/1946   TOTAL KNEE REVISION Left 06/20/2015   Procedure: TOTAL KNEE REVISION;  Surgeon: Kennedy Bucker, MD;  Location: [facility] ORS;  Service: Orthopedics;  Laterality: Left;    Current Outpatient Medications  Medication Instructions   acetaminophen (TYLENOL) 650 mg, Oral, 2 times daily   albuterol (VENTOLIN HFA) 108 (90 Base) MCG/ACT inhaler 1-2 puffs, Every 6 hours PRN   bisoprolol (ZEBETA) 5 mg, Oral, Daily   Calcium Citrate-Vitamin D (CITRACAL + D PO) 1 tablet, Oral, 2 times daily    cycloSPORINE (RESTASIS) 0.05 % ophthalmic emulsion 1 drop, Both Eyes, 2 times daily   dutasteride (AVODART) 0.5 MG capsule TAKE ONE CAPSULE BY MOUTH EVERY 3 DAYS   ELIQUIS 5 MG TABS tablet TAKE ONE (1) TABLET BY MOUTH TWO TIMES PER DAY   furosemide (LASIX) 80 mg, Oral, Every other day   glipiZIDE (GLUCOTROL XL) 5 mg, Oral, Daily   latanoprost (XALATAN) 0.005 % ophthalmic solution 1 drop, Right Eye, Daily at bedtime   losartan (COZAAR) 50 mg, Oral, Daily   metFORMIN (GLUCOPHAGE-XR) 500 mg, Oral, 2 times daily   montelukast (SINGULAIR) 10 mg, Oral, Daily   Multiple Vitamin (MULTIVITAMIN) tablet 1 tablet, Oral, Daily,     omeprazole (PRILOSEC) 40 mg, Oral, Every morning   rosuvastatin (CRESTOR) 40 MG tablet TAKE 1 TABLET BY MOUTH DAILY   spironolactone (ALDACTONE) 25 mg, Oral, Daily   traZODone (DESYREL) 100 mg, Oral, Daily at bedtime    Social History:  The patient  reports that he has never smoked. He has never used smokeless tobacco. He reports current alcohol use of  about 7.0 standard drinks of alcohol per week. He reports that he does not use drugs.   Family History:  The patient's family history includes Cirrhosis in his mother; Emphysema in his father; Hypertension in his brother and son; Lung cancer in his father.  ROS:  Please see the history of present illness. All other systems are reviewed and otherwise negative.   PHYSICAL EXAM:  VS:  There were no vitals taken for this visit. BMI: There is no height or weight on file to calculate BMI.  GEN- The patient is well appearing, alert and oriented x 3 today.   Lungs- Clear to ausculation bilaterally, normal work of breathing.  Heart- Regular rate and rhythm, no murmurs, rubs or gallops Extremities- 1-2+ peripheral edema, warm, dry Skin-  device pocket well-healed, no tethering   Device interrogation done today and reviewed by myself:  Battery 5.1 years Lead thresholds, impedence, sensing stable  Dependent down to VVI 40 VP  99.6% Continues to have persistent AFib   EKG is ordered. Personal review of EKG from today shows:    EKG Interpretation Date/Time:  Monday October 25 2023 11:18:51 EST Ventricular Rate:  61 PR Interval:    QRS Duration:  150 QT Interval:  458 QTC Calculation: 461 R Axis:   -80  Text Interpretation: Ventricular-paced rhythm Confirmed by Sherie Don [phone]) on 10/25/2023 11:20:42 AM    Recent Labs: 09/08/2023: Magnesium 2.0 10/11/2023: BUN 23; Creatinine, Ser 0.97; Hemoglobin 12.2; Platelets 165; Potassium 4.5; Sodium 139  No results found for requested labs within last 365 days.   Estimated Creatinine Clearance: 58.7 mL/min (by C-G formula based on SCr of 0.97 mg/dL).   Wt Readings from Last 3 Encounters:  10/21/23 180 lb (81.6 kg)  10/11/23 181 lb 3.2 oz (82.2 kg)  09/08/23 177 lb 6.4 oz (80.5 kg)     Additional studies reviewed include: Previous EP, cardiology notes.   Long Term monitor, 09/23/2023 HR 60 - 123, average 68 bpm. Rare ventricular ectopy. 100% AF burden.  TTE, 09/23/2023 1. Left ventricular ejection fraction, by estimation, is 40 to 45%. The  left ventricle has mildly decreased function. The left ventricle has no  regional wall motion abnormalities. Left ventricular diastolic parameters are indeterminate. The average left ventricular global longitudinal strain is -11.9 %.   2. Right ventricular systolic function is mildly reduced. The right ventricular size is normal. There is mildly elevated pulmonary artery  systolic pressure. The estimated right ventricular systolic pressure is 41.7 mmHg.   3. Left atrial size was mildly dilated.   4. Right atrial size was severely dilated.   5. The mitral valve is normal in structure. Mild mitral valve regurgitation. No evidence of mitral stenosis.   6. The aortic valve has an indeterminant number of cusps. Aortic valve regurgitation is mild. Aortic valve sclerosis is present, with no evidence of aortic valve stenosis.    7. The inferior vena cava is normal in size with greater than 50%  respiratory variability, suggesting right atrial pressure of 3 mmHg.   TTE, 01/07/2023  1. Left ventricular ejection fraction, by estimation, is 45 to 50%. Left ventricular ejection fraction by 2D MOD biplane is 49.7 %. The left ventricle has mildly decreased function. The left ventricle demonstrates global hypokinesis. Left ventricular diastolic function could not be evaluated.   2. Right ventricular systolic function is normal. The right ventricular size is mildly enlarged. There is moderately elevated pulmonary artery systolic pressure. The estimated right ventricular systolic pressure is 46.9 mmHg.  3. Left atrial size was moderately dilated.   4. Right atrial size was severely dilated.   5. The mitral valve is grossly normal. Trivial mitral valve regurgitation.   6. The aortic valve is tricuspid. Aortic valve regurgitation is trivial. Aortic valve sclerosis is present, with no evidence of aortic valve stenosis. Aortic regurgitation PHT measures 700 msec.   7. The inferior vena cava is normal in size with <50% respiratory variability, suggesting right atrial pressure of 8 mmHg.   Comparison(s): Changes from prior study are noted. 07/06/2019: LVEF 50-55%.   NM myocardial spect, 09/24/2020 Abnormal, probably low risk pharmacologic myocardial perfusion stress test. There is a small in size, mild in severity basal anterolateral defect that appears more severe on the rest images. This most likely represents artifact but cannot rule out an element of scar. There is no evidence of significant ischemia. The left ventricular ejection fraction is mildly decreased (46%). Attenuation correction CT demonstrates coronary stents and coronary artery calcification, as well as aortic atherosclerosis and pacemaker wires. Sensitivity and specificity of the study degraded by artifact.  TTE, 07/06/2019  1. The left ventricle has low normal  systolic function, with an ejection fraction of 50-55%. The cavity size was normal. Left ventricular diastolic Doppler parameters are indeterminate.   2. The right ventricle has normal systolic function. The cavity was normal. There is no increase in right ventricular wall thickness. Unable to estimate RVSP   3. Left atrial size was mild-moderately dilated.   ASSESSMENT AND PLAN:  #) persis AFib Significant increase in afib burden Patient asymptomatic without palpitations, change in exercise capacity, or increased edema Shared decision making regarding whether to treat afib. Patient and wife prefer to closely monitor at this time Will update echo January 2025, if LVEF has reduced, will implement AAD +/- ablation  #) Hypercoag d/t persis afib CHA2DS2-VASc Score = 8 [CHF History: 1, HTN History: 1, Diabetes History: 1, Stroke History: 2, Vascular Disease History: 1, Age Score: 2, Gender Score: 0].  Therefore, the patient's annual risk of stroke is 10.8 %.    Stroke ppx - 5mg  eliquis BID, appropriately dosed No bleeding concerns  #) HFmrEF, previously reduced > improved NYHA II symptoms, activity mostly limited by back pain Most recent echo with LVEF 45-50%, previously 50-55% Update echo as above in about 2 months   #) MDT CRT-D in situ Device functioning well, see paceart for details VP 99%      Current medicines are reviewed at length with the patient today.   The patient does not have concerns regarding his medicines.  The following changes were made today:  none  Labs/ tests ordered today include:  Orders Placed This Encounter  Procedures   EKG 12-Lead   ECHOCARDIOGRAM COMPLETE     Disposition: Follow up with Dr. Graciela Husbands or EP APP  after 12/2023 echo    Signed, Sherie Don, NP  10/25/23  11:20 AM  Electrophysiology [facility]

## 2023-10-25 NOTE — Patient Instructions (Signed)
Medication Instructions:  The current medical regimen is effective;  continue present plan and medications.  *If you need a refill on your cardiac medications before your next appointment, please call your pharmacy*   Testing/Procedures Your physician has requested that you have an echocardiogram (January). Echocardiography is a painless test that uses sound waves to create images of your heart. It provides your doctor with information about the size and shape of your heart and how well your heart's chambers and valves are working.   You may receive an ultrasound enhancing agent through an IV if needed to better visualize your heart during the echo. This procedure takes approximately one hour.  There are no restrictions for this procedure.  This will take place at [address] [facility]) [address], [place] [address]  Please note: We ask at that you not bring children with you during ultrasound (echo/ vascular) testing. Due to room size and safety concerns, children are not allowed in the ultrasound rooms during exams. Our front office staff cannot provide observation of children in our lobby area while testing is being conducted. An adult accompanying a patient to their appointment will only be allowed in the ultrasound room at the discretion of the ultrasound technician under special circumstances. We apologize for any inconvenience.    Follow-Up: At [facility], you and your health needs are our priority.  As part of our continuing mission to provide you with exceptional heart care, we have created designated Provider Care Teams.  These Care Teams include your primary Cardiologist (physician) and Advanced Practice Providers (APPs -  Physician Assistants and Nurse Practitioners) who all work together to provide you with the care you need, when you need it.  We recommend signing up for the patient portal called "MyChart".  Sign up information is provided on this After  Visit Summary.  MyChart is used to connect with patients for Virtual Visits (Telemedicine).  Patients are able to view lab/test results, encounter notes, upcoming appointments, etc.  Non-urgent messages can be sent to your provider as well.   To learn more about what you can do with MyChart, go to [email or web address].    Your next appointment:   Mid- January (after ECHO)   Provider:   Sherie Don, NP

## 2023-11-03 ENCOUNTER — Other Ambulatory Visit: Payer: Self-pay

## 2023-11-03 MED ORDER — ROSUVASTATIN CALCIUM 40 MG PO TABS: 40.0000 mg | ORAL_TABLET | Freq: Every day | ORAL | 1 refills | Status: DC

## 2023-11-04 ENCOUNTER — Ambulatory Visit (INDEPENDENT_AMBULATORY_CARE_PROVIDER_SITE_OTHER): Payer: Medicare Other

## 2023-11-04 DIAGNOSIS — I255 Ischemic cardiomyopathy: Secondary | ICD-10-CM | POA: Diagnosis not present

## 2023-11-05 LAB — CUP PACEART REMOTE DEVICE CHECK
Battery Remaining Longevity: 63 mo
Battery Voltage: 2.98 V
Brady Statistic AP VP Percent: 8 %
Brady Statistic AP VS Percent: 0 %
Brady Statistic AS VP Percent: 91.5 %
Brady Statistic AS VS Percent: 0.51 %
Brady Statistic RA Percent Paced: 7.78 %
Brady Statistic RV Percent Paced: 99.49 %
Date Time Interrogation Session: 20241114022827
HighPow Impedance: 54 Ohm
HighPow Impedance: 70 Ohm
Implantable Lead Connection Status: 753985
Implantable Lead Connection Status: 753985
Implantable Lead Connection Status: 753985
Implantable Lead Implant Date: 20070615
Implantable Lead Implant Date: 20120813
Implantable Lead Implant Date: 20120813
Implantable Lead Location: 753858
Implantable Lead Location: 753859
Implantable Lead Location: 753860
Implantable Lead Model: 4396
Implantable Lead Model: 5076
Implantable Lead Model: 7121
Implantable Pulse Generator Implant Date: 20230816
Lead Channel Impedance Value: 266 Ohm
Lead Channel Impedance Value: 380 Ohm
Lead Channel Impedance Value: 380 Ohm
Lead Channel Impedance Value: 494 Ohm
Lead Channel Impedance Value: 627 Ohm
Lead Channel Impedance Value: 988 Ohm
Lead Channel Pacing Threshold Amplitude: 1.25 V
Lead Channel Pacing Threshold Pulse Width: 0.4 ms
Lead Channel Sensing Intrinsic Amplitude: 0.25 mV
Lead Channel Sensing Intrinsic Amplitude: 0.25 mV
Lead Channel Setting Pacing Amplitude: 2 V
Lead Channel Setting Pacing Amplitude: 2 V
Lead Channel Setting Pacing Amplitude: 2.5 V
Lead Channel Setting Pacing Pulse Width: 0.4 ms
Lead Channel Setting Pacing Pulse Width: 1 ms
Lead Channel Setting Sensing Sensitivity: 0.3 mV
Zone Setting Status: 755011

## 2023-11-22 ENCOUNTER — Ambulatory Visit: Payer: Medicare Other | Attending: Internal Medicine

## 2023-11-22 DIAGNOSIS — I5032 Chronic diastolic (congestive) heart failure: Secondary | ICD-10-CM | POA: Diagnosis not present

## 2023-11-22 DIAGNOSIS — Z95810 Presence of automatic (implantable) cardiac defibrillator: Secondary | ICD-10-CM

## 2023-11-22 NOTE — Progress Notes (Signed)
Remote ICD transmission.   

## 2023-11-23 ENCOUNTER — Encounter: Payer: Medicare Other | Admitting: Internal Medicine

## 2023-11-29 ENCOUNTER — Other Ambulatory Visit: Payer: Self-pay | Admitting: Internal Medicine

## 2023-11-29 DIAGNOSIS — I480 Paroxysmal atrial fibrillation: Secondary | ICD-10-CM

## 2023-11-29 NOTE — Telephone Encounter (Signed)
Eliquis 5mg  refill request received. Patient is 82 years old, weight-82.8kg, Crea-0.97 on 10/11/23, Diagnosis-Afib, and last seen by Levy Sjogren on 10/25/23. Dose is appropriate based on dosing criteria. Will send in refill to requested pharmacy.

## 2023-11-30 NOTE — Progress Notes (Signed)
EPIC Encounter for ICM Monitoring  Patient Name: [name]. is a 82 y.o. male Date: 11/30/2023 Primary Care Physican: Kandyce Rud, MD Primary Cardiologist: Graciela Husbands Electrophysiologist: Joycelyn Schmid Pacing: 98.9%         02/09/2023 Weight: 180 lbs 04/23/2023 Weight: 181 lbs 07/02/2023 Weight: 179-180 lbs 08/06/2023 Weight: 179 lbs 11/30/2023 Weight: 176.3 lbs   Since 04-Nov-2023 AT/AF  409 Time in AT/AF  23.2 hr/day (96.8%) Longest AT/AF  8 hours                                                            Spoke with patient and heart failure questions reviewed.  Transmission results reviewed.  Pt asymptomatic for fluid accumulation.  Reports feeling well at this time and voices no complaints.  He had failed cardioversion and may try ablation.  He will know more after echo in January.   Optivol thoracic impedance suggesting normal fluid levels within the last month.       Prescribed:  Furosemide 80 mg Take 1 tablet (s) (80 mg total) by mouth every OTHER day.     Labs: 01/19/2023 Creatinine 0.9, BUN 24, Potassium 4.0, Sodium 141, GFR 86 A complete set of results can be found in Results Review.   Recommendations:  No changes and encouraged to call if experiencing any fluid symptoms.   Follow-up plan: ICM clinic phone appointment on 12/27/2023.   91 day device clinic remote transmission 02/03/2024.     EP/Cardiology Office Visits:   12/29/2023 with Sherie Don, NP.     Copy of ICM check sent to Dr. Graciela Husbands.  3 month ICM trend: 11/22/2023.    12-14 Month ICM trend:     Karie Soda, RN 11/30/2023 2:06 PM

## 2023-12-27 ENCOUNTER — Ambulatory Visit: Payer: Medicare Other | Attending: Cardiology

## 2023-12-27 ENCOUNTER — Ambulatory Visit (INDEPENDENT_AMBULATORY_CARE_PROVIDER_SITE_OTHER): Payer: Medicare Other

## 2023-12-27 DIAGNOSIS — I5032 Chronic diastolic (congestive) heart failure: Secondary | ICD-10-CM | POA: Diagnosis not present

## 2023-12-27 DIAGNOSIS — I5022 Chronic systolic (congestive) heart failure: Secondary | ICD-10-CM

## 2023-12-27 DIAGNOSIS — Z95810 Presence of automatic (implantable) cardiac defibrillator: Secondary | ICD-10-CM | POA: Diagnosis not present

## 2023-12-27 DIAGNOSIS — I4819 Other persistent atrial fibrillation: Secondary | ICD-10-CM

## 2023-12-27 LAB — ECHOCARDIOGRAM COMPLETE
Area-P 1/2: 5.02 cm2
S' Lateral: 3.1 cm

## 2023-12-28 NOTE — Progress Notes (Signed)
 Cardiology Office Note Date:  12/29/2023  Patient ID:  [name]., DOB 1941-12-05, MRN [id number] PCP:  Diedra Lame, MD  Cardiologist:  Elspeth Sage, MD Electrophysiologist: Elspeth Sage, MD    Chief Complaint: AFib, HF follow-up  History of Present Illness: [name]. is a 83 y.o. male with PMH notable for syncope, HFmrEF, ICM, CRT-D, CAD s/p PCI, persis afib, HTN, T2DM; seen today for Elspeth Sage, MD for routine electrophysiology followup.  ICD initially implanted for HFrEF. Underwent CRT upgrade 2012. I saw him 08/2023 for routine follow-up, noted to have increased AFib burden via device, no symptomatic awareness. Some concern that patient's AFib was not parox but persis. Updated ambulatory monitor confirmed persis afib and patient was scheduled for DCCV. He is s/p DCCV 10/30 with IRAF.  I saw him in follow-up 10/2023 where he was asymptomatic of arrhythmia with good exercise tolerance. We decided on close monitoring to guide AAD treatment.   On follow-up today, he continues to feel well. 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 Fluid status is stable on lasix  every other day with robust UOP.    He follows monthly with ICM nurse  His wife joins for today's appt, whom I have met previously  Device Information: MDT CRT-D, imp 05/2006; dx ICM, HFrEF CRT upgrade 2012 Gen change 07/2022  AAD History: none  ROS:  Please see the history of present illness. All other systems are reviewed and otherwise negative.   PHYSICAL EXAM:  VS:  BP 110/70 (BP Location: Left Arm, Patient Position: Sitting, Cuff Size: Normal)   Pulse 65   Ht 5' 5 (1.651 m)   Wt 183 lb 2 oz (83.1 kg)   SpO2 97%   BMI 30.47 kg/m  BMI: Body mass index is 30.47 kg/m.  Wt Readings from Last 3 Encounters:  12/29/23 183 lb 2 oz (83.1 kg)  10/25/23 182 lb 9.6 oz (82.8 kg)  10/21/23 180 lb (81.6 kg)    GEN- The patient is well appearing, alert  and oriented x 3 today.   Lungs- Clear to ausculation bilaterally, normal work of breathing.  Heart- Regular rate and rhythm, no murmurs, rubs or gallops Extremities- 1-2+ peripheral edema, warm, dry Skin-  device pocket well-healed, no tethering   Device interrogation done today and reviewed by myself:  Battery 4.9 years Lead thresholds, impedence, sensing stable  Dependent down to VVI 40 VP 98% 100% AF burden   EKG is ordered. Personal review of EKG from today shows:    EKG Interpretation Date/Time:  Wednesday December 29 2023 10:42:14 EST Ventricular Rate:  65 PR Interval:    QRS Duration:  150 QT Interval:  444 QTC Calculation: 461 R Axis:   -66  Text Interpretation: Ventricular-paced rhythm When compared with ECG of 25-Oct-2023 11:18, Vent. rate has increased BY   4 BPM Confirmed by Delaney Schnick [phone]) on 12/29/2023 10:46:09 AM    Additional studies reviewed include: Previous EP, cardiology notes.   Long Term monitor, 09/23/2023 HR 60 - 123, average 68 bpm. Rare ventricular ectopy. 100% AF burden.  TTE, 12/27/2023  1. Left ventricular ejection fraction, by estimation, is 40 to 45%. The left ventricle has mildly decreased function. The left ventricle demonstrates global hypokinesis. Left ventricular diastolic parameters are indeterminate. The average left ventricular global longitudinal strain is -13.8 %.   2. Right ventricular systolic function is mildly reduced. The right ventricular size is normal. There is mildly elevated pulmonary artery  systolic pressure. The estimated right ventricular systolic pressure is 43.9 mmHg.   3. Left atrial size was mild to moderately dilated.   4. Right atrial size was mildly dilated.   5. The mitral valve is normal in structure. Mild mitral valve regurgitation. No evidence of mitral stenosis.   6. The aortic valve is normal in structure. Aortic valve regurgitation is mild. Aortic valve sclerosis/calcification is present, without any  evidence of aortic stenosis.   7. The inferior vena cava is normal in size with greater than 50% respiratory variability, suggesting right atrial pressure of 3 mmHg.   TTE, 09/23/2023 1. Left ventricular ejection fraction, by estimation, is 40 to 45%. The  left ventricle has mildly decreased function. The left ventricle has no  regional wall motion abnormalities. Left ventricular diastolic parameters are indeterminate. The average left ventricular global longitudinal strain is -11.9 %.   2. Right ventricular systolic function is mildly reduced. The right ventricular size is normal. There is mildly elevated pulmonary artery  systolic pressure. The estimated right ventricular systolic pressure is 41.7 mmHg.   3. Left atrial size was mildly dilated.   4. Right atrial size was severely dilated.   5. The mitral valve is normal in structure. Mild mitral valve regurgitation. No evidence of mitral stenosis.   6. The aortic valve has an indeterminant number of cusps. Aortic valve regurgitation is mild. Aortic valve sclerosis is present, with no evidence of aortic valve stenosis.   7. The inferior vena cava is normal in size with greater than 50%  respiratory variability, suggesting right atrial pressure of 3 mmHg.   TTE, 01/07/2023  1. Left ventricular ejection fraction, by estimation, is 45 to 50%. Left ventricular ejection fraction by 2D MOD biplane is 49.7 %. The left ventricle has mildly decreased function. The left ventricle demonstrates global hypokinesis. Left ventricular diastolic function could not be evaluated.   2. Right ventricular systolic function is normal. The right ventricular size is mildly enlarged. There is moderately elevated pulmonary artery systolic pressure. The estimated right ventricular systolic pressure is 46.9 mmHg.   3. Left atrial size was moderately dilated.   4. Right atrial size was severely dilated.   5. The mitral valve is grossly normal. Trivial mitral valve  regurgitation.   6. The aortic valve is tricuspid. Aortic valve regurgitation is trivial. Aortic valve sclerosis is present, with no evidence of aortic valve stenosis. Aortic regurgitation PHT measures 700 msec.   7. The inferior vena cava is normal in size with <50% respiratory variability, suggesting right atrial pressure of 8 mmHg.   Comparison(s): Changes from prior study are noted. 07/06/2019: LVEF 50-55%.   NM myocardial spect, 09/24/2020 Abnormal, probably low risk pharmacologic myocardial perfusion stress test. There is a small in size, mild in severity basal anterolateral defect that appears more severe on the rest images. This most likely represents artifact but cannot rule out an element of scar. There is no evidence of significant ischemia. The left ventricular ejection fraction is mildly decreased (46%). Attenuation correction CT demonstrates coronary stents and coronary artery calcification, as well as aortic atherosclerosis and pacemaker wires. Sensitivity and specificity of the study degraded by artifact.  TTE, 07/06/2019  1. The left ventricle has low normal systolic function, with an ejection fraction of 50-55%. The cavity size was normal. Left ventricular diastolic Doppler parameters are indeterminate.   2. The right ventricle has normal systolic function. The cavity was normal. There is no increase in right ventricular wall thickness. Unable to  estimate RVSP   3. Left atrial size was mild-moderately dilated.   ASSESSMENT AND PLAN:  #) persis AFib Continues to be in afib Patient asymptomatic without palpitations, decrease in exercise capacity, or increased edema Updated TTE with stable mid-range LVEF Discussed with Dr. Fernande who recommends consideration of pulse-field ablation.  #) Hypercoag d/t persis afib CHA2DS2-VASc Score = 8 [CHF History: 1, HTN History: 1, Diabetes History: 1, Stroke History: 2, Vascular Disease History: 1, Age Score: 2, Gender Score: 0].  Therefore,  the patient's annual risk of stroke is 10.8 %.    Stroke ppx - 5mg  eliquis  BID, appropriately dosed No bleeding concerns  #) HFmrEF, previously improved NYHA II symptoms, activity mostly limited by back pain Most recent echo with stable mid-range LVEF at 40-45% Continue 5mg  bisoprolol , 50mg  losartan, 25mg  spiro Diuretic: 80mg  lasix  every other day  #) MDT CRT-D in situ Device functioning well, see paceart for details VP 99% No ventricular arrhythmias       Current medicines are reviewed at length with the patient today.   The patient does not have concerns regarding his medicines.  The following changes were made today:  none  Labs/ tests ordered today include:  Orders Placed This Encounter  Procedures   EKG 12-Lead     Disposition: Follow up with Dr. Fernande or EP APP in 3 months   Signed, Everard Interrante, NP  12/29/23  12:48 PM  Electrophysiology [facility]

## 2023-12-29 ENCOUNTER — Ambulatory Visit: Payer: Medicare Other | Attending: Cardiology | Admitting: Cardiology

## 2023-12-29 ENCOUNTER — Encounter: Payer: Self-pay | Admitting: Cardiology

## 2023-12-29 VITALS — BP 110/70 | HR 65 | Ht 65.0 in | Wt 183.1 lb

## 2023-12-29 DIAGNOSIS — I4819 Other persistent atrial fibrillation: Secondary | ICD-10-CM | POA: Diagnosis not present

## 2023-12-29 DIAGNOSIS — I5022 Chronic systolic (congestive) heart failure: Secondary | ICD-10-CM | POA: Insufficient documentation

## 2023-12-29 DIAGNOSIS — D6869 Other thrombophilia: Secondary | ICD-10-CM | POA: Diagnosis not present

## 2023-12-29 DIAGNOSIS — Z95810 Presence of automatic (implantable) cardiac defibrillator: Secondary | ICD-10-CM

## 2023-12-29 DIAGNOSIS — I5020 Unspecified systolic (congestive) heart failure: Secondary | ICD-10-CM

## 2023-12-29 LAB — CUP PACEART INCLINIC DEVICE CHECK
Date Time Interrogation Session: 20250108141549
Implantable Lead Connection Status: 753985
Implantable Lead Connection Status: 753985
Implantable Lead Connection Status: 753985
Implantable Lead Implant Date: 20070615
Implantable Lead Implant Date: 20120813
Implantable Lead Implant Date: 20120813
Implantable Lead Location: 753858
Implantable Lead Location: 753859
Implantable Lead Location: 753860
Implantable Lead Model: 4396
Implantable Lead Model: 5076
Implantable Lead Model: 7121
Implantable Pulse Generator Implant Date: 20230816

## 2023-12-29 NOTE — Patient Instructions (Signed)
 Medication Instructions:   Your physician recommends that you continue on your current medications as directed. Please refer to the Current Medication list given to you today.  *If you need a refill on your cardiac medications before your next appointment, please call your pharmacy*   Lab Work:  None Ordered  If you have labs (blood work) drawn today and your tests are completely normal, you will receive your results only by: MyChart Message (if you have MyChart) OR A paper copy in the mail If you have any lab test that is abnormal or we need to change your treatment, we will call you to review the results.   Testing/Procedures:  None Ordered   Follow-Up: At [facility], you and your health needs are our priority.  As part of our continuing mission to provide you with exceptional heart care, we have created designated Provider Care Teams.  These Care Teams include your primary Cardiologist (physician) and Advanced Practice Providers (APPs -  Physician Assistants and Nurse Practitioners) who all work together to provide you with the care you need, when you need it.  We recommend signing up for the patient portal called MyChart.  Sign up information is provided on this After Visit Summary.  MyChart is used to connect with patients for Virtual Visits (Telemedicine).  Patients are able to view lab/test results, encounter notes, upcoming appointments, etc.  Non-urgent messages can be sent to your provider as well.   To learn more about what you can do with MyChart, go to [email or web address].    Your next appointment:   3 month(s)  Provider:   Elspeth Sage, MD

## 2023-12-31 ENCOUNTER — Other Ambulatory Visit: Payer: Medicare Other

## 2023-12-31 DIAGNOSIS — N400 Enlarged prostate without lower urinary tract symptoms: Secondary | ICD-10-CM

## 2023-12-31 NOTE — Progress Notes (Signed)
 EPIC Encounter for ICM Monitoring  Patient Name: [name]. is a 83 y.o. male Date: 12/31/2023 Primary Care Physican: Diedra Lame, MD Primary Cardiologist: Fernande Electrophysiologist: Fernande Pore Pacing: 98.9%         02/09/2023 Weight: 180 lbs 04/23/2023 Weight: 181 lbs 07/02/2023 Weight: 179-180 lbs 08/06/2023 Weight: 179 lbs 11/30/2023 Weight: 176.3 lbs   Since 22-Nov-2023 AT/AF  869 Time in AT/AF  23.2 hr/day (98.6%) Longest AT/AF  36 hours                                                            Office defib check completed 12/29/2023 with Chantal Needle, NP and per notes in persistent afib.  No fluid on exam.   Optivol thoracic impedance suggesting normal fluid levels within the last month.       Prescribed:  Furosemide  80 mg Take 1 tablet (s) (80 mg total) by mouth every OTHER day.     Labs: 01/19/2023 Creatinine 0.9, BUN 24, Potassium 4.0, Sodium 141, GFR 86 A complete set of results can be found in Results Review.   Recommendations:  Any recommendations were given at 1/8 OV.     Follow-up plan: ICM clinic phone appointment on 01/31/2024.   91 day device clinic remote transmission 02/03/2024.     EP/Cardiology Office Visits:   Recall 03/28/2024 with Dr Fernande.     Copy of ICM check sent to Dr. Fernande.  3 month ICM trend: 12/27/2023.    12-14 Month ICM trend:     Mitzie GORMAN Garner, RN 12/31/2023 10:55 AM

## 2024-01-01 LAB — PSA: Prostate Specific Ag, Serum: 4.5 ng/mL — ABNORMAL HIGH (ref 0.0–4.0)

## 2024-01-04 ENCOUNTER — Ambulatory Visit (INDEPENDENT_AMBULATORY_CARE_PROVIDER_SITE_OTHER): Payer: Medicare Other | Admitting: Urology

## 2024-01-04 VITALS — BP 99/64 | HR 73 | Ht 65.0 in | Wt 175.0 lb

## 2024-01-04 DIAGNOSIS — R972 Elevated prostate specific antigen [PSA]: Secondary | ICD-10-CM

## 2024-01-04 DIAGNOSIS — N400 Enlarged prostate without lower urinary tract symptoms: Secondary | ICD-10-CM

## 2024-01-04 LAB — BLADDER SCAN AMB NON-IMAGING: Scan Result: 48

## 2024-01-04 NOTE — Progress Notes (Signed)
 I,Amy L Pierron,acting as a scribe for Rosina Riis, MD.,have documented all relevant documentation on the behalf of Rosina Riis, MD,as directed by  Rosina Riis, MD while in the presence of Rosina Riis, MD.  01/04/2024 4:17 PM   Sharolyn LITTIE Kathy Mickey. August 01, 1941 [id number]  Referring provider: Diedra Lame, MD [phone] [address] [facility] [place],  [place] [address]  Chief Complaint  Patient presents with   Benign Prostatic Hypertrophy    HPI: 83 year-old male with a personal history of elevated PSA, BPH, bladder stones, and hematuria presents today for routine annual follow-up.  He is status post UroLift in 2021 along with Cystolithoapaxy for treatment of his bladder stones.   He's on chronic Avodart  every third day.   His most recent PSA from 12/31/2023 was 4.5 which is stable from last year.   IPSS     Row Name 01/04/24 1300         International Prostate Symptom Score   How often have you had the sensation of not emptying your bladder? Not at All     How often have you had to urinate less than every two hours? Less than 1 in 5 times     How often have you found you stopped and started again several times when you urinated? Less than half the time     How often have you found it difficult to postpone urination? Not at All     How often have you had a weak urinary stream? Less than 1 in 5 times     How often have you had to strain to start urination? Not at All     How many times did you typically get up at night to urinate? 1 Time     Total IPSS Score 5       Quality of Life due to urinary symptoms   If you were to spend the rest of your life with your urinary condition just the way it is now how would you feel about that? Mostly Satisfied            Score:  1-7 Mild 8-19 Moderate 20-35 Severe Results for orders placed or performed in visit on 01/04/24  Bladder Scan (Post Void Residual) in office  Result Value  Ref Range   Scan Result 48     PMH: Past Medical History:  Diagnosis Date   (HFpEF) heart failure with preserved ejection fraction (HCC)    a.) TTE 10/30/2010: EF 30-35%; sev anterior wall and apical HK. b.) TTE 11/20/2014: EF 50-55%; no RWMAs. c.) TTE 07/06/2019: EF 50-55%; no RWMAs.   Allergic rhinitis    Anemia    Aortic atherosclerosis (HCC)    Arthritis    Asthma    Atrial fibrillation (HCC)    a.) CHA2DS2-VASc = 8 (age x 2, CHF, HTN, TIA x 2, prior MI, T2DM). b.) rate/rhythm maintained with bisoprolol ; chronically anticoagulated with apixaban .   Biventricular ICD (implantable cardioverter-defibrillator) in place 2007   a.) Medtronic device placed in 2007 with CRT upgrade in 07/2011. Generator changed 02/2016.   BPH (benign prostatic hyperplasia)    CAD (coronary artery disease) 01/14/1994   a.) cardiac arrest; ROSC achieved. LHC 01/14/1994 showed 95% pLAD stenosis. PCI performed --> 3.5 x 15 mm JJIS to pLAD.  b.) PCI performed in 2007 in [place], [place]; type and location of stent unknown. c.) LHC 2012 --> patent LAD stent; nonobstructive disease.   Cardiac arrest (HCC)  Chronic lower back pain    Current use of long term anticoagulation    a.) apixaban    Dyspnea    PFT 12/19/10: FEV1 2.93(107%), FEV1% 72, TLC 8.02(138%), DLCO 110%, +BD   ED (erectile dysfunction)    Elevated PSA    Encysted hydrocele    GERD (gastroesophageal reflux disease)    High-grade heart block    a.) BiV ICD in place   History of kidney stones    Hyperlipidemia    Hypertension    Insomnia    Ischemic cardiomyopathy with implantable cardioverter-defibrillator (ICD)    Myocardial infarction (HCC)    Sciatic nerve pain    Severe sinus bradycardia    a.) s/p Medtronic BiV ICD placement   Syncope    T2DM (type 2 diabetes mellitus) (HCC)    TIA (transient ischemic attack) 10/2010    Surgical History: Past Surgical History:  Procedure Laterality Date   CARDIOVERSION N/A 10/21/2023    Procedure: CARDIOVERSION;  Surgeon: Perla Evalene PARAS, MD;  Location: [facility]S;  Service: Cardiovascular;  Laterality: N/A;   CARDIOVERSION N/A 10/21/2023   Procedure: CARDIOVERSION;  Surgeon: Perla Evalene PARAS, MD;  Location: [facility];  Service: Cardiovascular;  Laterality: N/A;   CATARACT EXTRACTION, BILATERAL     COLONOSCOPY WITH PROPOFOL  N/A 04/01/2018   Procedure: COLONOSCOPY WITH PROPOFOL ;  Surgeon: Viktoria Lamar DASEN, MD;  Location: [facility] ENDOSCOPY;  Service: Endoscopy;  Laterality: N/A;   CORONARY ANGIOPLASTY     LAD stent later additional stent placed 02/2006 branch of LAD   CYSTOSCOPY WITH INSERTION OF UROLIFT N/A 07/15/2020   Procedure: CYSTOSCOPY WITH INSERTION OF UROLIFT;  Surgeon: Penne Knee, MD;  Location: [facility];  Service: Urology;  Laterality: N/A;   CYSTOSCOPY WITH INSERTION OF UROLIFT N/A 11/03/2021   Procedure: CYSTOSCOPY WITH INSERTION OF UROLIFT;  Surgeon: Penne Knee, MD;  Location: [facility];  Service: Urology;  Laterality: N/A;   CYSTOSCOPY WITH LITHOLAPAXY N/A 07/15/2020   Procedure: CYSTOSCOPY WITH LITHOLAPAXY;  Surgeon: Penne Knee, MD;  Location: [facility];  Service: Urology;  Laterality: N/A;   CYSTOSCOPY WITH LITHOLAPAXY N/A 11/03/2021   Procedure: CYSTOSCOPY WITH LITHOLAPAXY;  Surgeon: Penne Knee, MD;  Location: [facility];  Service: Urology;  Laterality: N/A;   EP IMPLANTABLE DEVICE N/A 03/18/2016   Procedure:  BiV ICD Generator Changeout;  Surgeon: Elspeth JAYSON Sage, MD;  Location: [facility] INVASIVE CV LAB;  Service: Cardiovascular;  Laterality: N/A;   ESOPHAGOGASTRODUODENOSCOPY (EGD) WITH PROPOFOL  N/A 04/01/2018   Procedure: ESOPHAGOGASTRODUODENOSCOPY (EGD) WITH PROPOFOL ;  Surgeon: Viktoria Lamar DASEN, MD;  Location: [facility] ENDOSCOPY;  Service: Endoscopy;  Laterality: N/A;   ICD GENERATOR CHANGEOUT N/A 08/05/2022   Procedure: ICD GENERATOR CHANGEOUT;  Surgeon: Sage Elspeth JAYSON, MD;  Location: [facility] INVASIVE CV LAB;  Service: Cardiovascular;  Laterality: N/A;    Implantation of Medtronic dual-chamber cardioverter N/A 2007   Left Knee Replacement  12/21/2004   PROSTATE BIOPSY     TONSILLECTOMY  12/21/1946   TOTAL KNEE REVISION Left 06/20/2015   Procedure: TOTAL KNEE REVISION;  Surgeon: Ozell Flake, MD;  Location: [facility] ORS;  Service: Orthopedics;  Laterality: Left;    Home Medications:  Allergies as of 01/04/2024       Reactions   Dorzolamide    Beta Adrenergic Blockers Other (See Comments)   Fatigue   Oxycodone Nausea And Vomiting   Sulfonamide Derivatives Nausea And Vomiting        Medication List        Accurate as of January 04, 2024  4:17 PM. If you have any questions, ask your nurse or doctor.          acetaminophen  650 MG CR tablet Commonly known as: TYLENOL  Take 650 mg by mouth 2 (two) times daily.   albuterol  108 (90 Base) MCG/ACT inhaler Commonly known as: VENTOLIN  HFA Inhale 1-2 puffs into the lungs every 6 (six) hours as needed for wheezing or shortness of breath.   bisoprolol  5 MG tablet Commonly known as: ZEBETA  Take 5 mg by mouth daily.   CITRACAL + D PO Take 1 tablet by mouth 2 (two) times daily.   cycloSPORINE 0.05 % ophthalmic emulsion Commonly known as: RESTASIS Place 1 drop into both eyes 2 (two) times daily.   dutasteride  0.5 MG capsule Commonly known as: AVODART  TAKE ONE CAPSULE BY MOUTH EVERY 3 DAYS   Eliquis  5 MG Tabs tablet Generic drug: apixaban  TAKE ONE (1) TABLET BY MOUTH TWO TIMES PER DAY   furosemide  80 MG tablet Commonly known as: LASIX  Take 1 tablet (80 mg total) by mouth every other day.   glipiZIDE 5 MG 24 hr tablet Commonly known as: GLUCOTROL XL Take 5 mg by mouth daily.   ipratropium 0.03 % nasal spray Commonly known as: ATROVENT Place 2 sprays into both nostrils daily as needed for rhinitis.   latanoprost  0.005 % ophthalmic solution Commonly known as: XALATAN  Place 1 drop into the right eye at bedtime.   losartan 50 MG tablet Commonly known as: COZAAR Take 50 mg  by mouth daily.   metFORMIN 500 MG 24 hr tablet Commonly known as: GLUCOPHAGE-XR Take 500 mg by mouth in the morning and at bedtime.   montelukast 10 MG tablet Commonly known as: SINGULAIR Take 10 mg by mouth daily.   multivitamin tablet Take 1 tablet by mouth daily.   omeprazole 40 MG capsule Commonly known as: PRILOSEC Take 40 mg by mouth every morning.   rosuvastatin  40 MG tablet Commonly known as: CRESTOR  Take 1 tablet (40 mg total) by mouth daily.   spironolactone  25 MG tablet Commonly known as: ALDACTONE  Take 1 tablet (25 mg total) by mouth daily.   traZODone  100 MG tablet Commonly known as: DESYREL  Take 100 mg by mouth at bedtime.        Allergies:  Allergies  Allergen Reactions   Dorzolamide    Beta Adrenergic Blockers Other (See Comments)    Fatigue   Oxycodone Nausea And Vomiting   Sulfonamide Derivatives Nausea And Vomiting    Family History: Family History  Problem Relation Age of Onset   Emphysema Father    Lung cancer Father    Cirrhosis Mother    Hypertension Brother    Hypertension Son    Heart attack Neg Hx    Stroke Neg Hx     Social History:  reports that he has never smoked. He has never used smokeless tobacco. He reports current alcohol use of about 7.0 standard drinks of alcohol per week. He reports that he does not use drugs.   Physical Exam: BP 99/64   Pulse 73   Ht 5' 5 (1.651 m)   Wt 175 lb (79.4 kg)   BMI 29.12 kg/m   Constitutional:  Alert and oriented, No acute distress. HEENT: [place] AT, moist mucus membranes.  Trachea midline, no masses. Neurologic: Grossly intact, no focal deficits, moving all 4 extremities. Psychiatric: Normal mood and affect.   Assessment & Plan:    1. BPH Doing well on current regimen, Avodart  every third day.  Symptoms  well-controlled.  2. Elevated PSA PSA stable.  Would recommend continue to follow this given his overall good health as well as history as long as he is on Avodart .  He had  a discussion today about whether or not he would like to just follow-up with his PCP at this point in time given that his symptoms are stable.  He prefers to follow-up with us .  Follow-up in 1 year with IPSS/PVR/PSA  I have reviewed the above documentation for accuracy and completeness, and I agree with the above.   Rosina Riis, MD   [facility] [address], [address] [place], [place] [address] [phone]

## 2024-01-05 ENCOUNTER — Other Ambulatory Visit: Payer: Self-pay | Admitting: Internal Medicine

## 2024-01-17 NOTE — H&P (View-Only) (Signed)
 Electrophysiology Office Note:   Date:  01/19/2024  ID:  Ronnie Dopp., DOB [birth year], MRN [id number]  Primary Cardiologist: Sherryl Manges, MD Electrophysiologist: Sherryl Manges, MD      History of Present Illness:   Ronnie Prehn. is a 82 y.o. 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 [name].  Discussed the use of AI scribe software for clinical note transcription with the patient, who gave verbal consent to proceed.  History of Present Illness   The patient, an 82 year old with a history of atrial fibrillation and a pacemaker, presents for a consultation regarding a potential ablation procedure. The patient has been in persistent atrial fibrillation since September. He underwent cardioversion with ERAF. Despite this, the patient continues to maintain his usual physical activity level, which includes walking over 10,000 steps daily. The patient does not report any fatigue, shortness of breath, dizziness, lightheadedness. He has some occasional palpitations. The patient's pacemaker has been reprogrammed to conserve battery life due to the persistent atrial fibrillation. The patient expresses concern about the long-term implications of remaining in atrial fibrillation.      Review of systems complete and found to be negative unless listed in HPI.   EP Information / Studies Reviewed:    EKG is not ordered today. EKG from 12/29/23 reviewed which showed ventricular paced rhythm.     Zio Monitor 09/2023:  HR 60 - 123, average 68 bpm. Rare ventricular ectopy. 100% AF burden.  Echo 12/27/23:  1. Left ventricular ejection fraction, by estimation, is 40 to 45%. The  left ventricle has mildly decreased function. The left ventricle  demonstrates global hypokinesis. Left ventricular diastolic parameters are   indeterminate. The average left  ventricular global longitudinal strain is -13.8 %.   2. Right ventricular systolic function is mildly reduced. The right  ventricular size is normal. There is mildly elevated pulmonary artery  systolic pressure. The estimated right ventricular systolic pressure is  43.9 mmHg.   3. Left atrial size was mild to moderately dilated.   4. Right atrial size was mildly dilated.   5. The mitral valve is normal in structure. Mild mitral valve  regurgitation. No evidence of mitral stenosis.   6. The aortic valve is normal in structure. Aortic valve regurgitation is  mild. Aortic valve sclerosis/calcification is present, without any  evidence of aortic stenosis.   7. The inferior vena cava is normal in size with greater than 50%  respiratory variability, suggesting right atrial pressure of 3 mmHg.   Risk Assessment/Calculations:    CHA2DS2-VASc Score = 8   This indicates a 10.8% annual risk of stroke. The patient's score is based upon: CHF History: 1 HTN History: 1 Diabetes History: 1 Stroke History: 2 Vascular Disease History: 1 Age Score: 2 Gender Score: 0             Physical Exam:   VS:  BP 118/72   Pulse 68   Ht 5\' 5"  (1.651 m)   Wt 184 lb (83.5 kg)   SpO2 98%   BMI 30.62 kg/m    Wt Readings from Last 3 Encounters:  01/18/24 184 lb (83.5 kg)  01/04/24 175 lb (79.4 kg)  12/29/23 183 lb 2 oz (83.1 kg)     GEN: Well nourished, well developed in no acute distress NECK: No JVD CARDIAC: Normal  rate and regular rhythm RESPIRATORY:  Clear to auscultation without rales, wheezing or rhonchi  ABDOMEN: Soft, non-distended EXTREMITIES:  No edema; No deformity   ASSESSMENT AND PLAN:   Ronnie Critzer. is a 83 y.o. 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 [name].  #.  Persistent atrial fibrillation: He has had cardioversion with early recurrence.  He is concerned about the long-term issues associated with permanent atrial fibrillation, potentially higher stroke risk and development of worsening heart failure; he has known LV dysfunction.  For this reason, he he would like to pursue a rhythm control strategy, which I think is reasonable.  -We discussed antiarrhythmic drug therapy with Tikosyn versus pursuing catheter ablation.Discussed risks, recovery and likelihood of success with each treatment strategy. Risk, benefits, and alternatives to EP study and ablation for afib were discussed. These risks include but are not limited to stroke, bleeding, vascular damage, tamponade, perforation, damage to the esophagus, lungs, phrenic nerve and other structures, pulmonary vein stenosis, worsening renal function, coronary vasospasm and death.  Discussed potential need for repeat ablation procedures and antiarrhythmic drugs after an initial ablation. The patient understands these risk and wishes to proceed.  We will therefore proceed with catheter ablation at the next available time.  Carto, ICE, anesthesia are requested for the procedure.  Will also obtain CT PV protocol prior to the procedure to exclude LAA thrombus and further evaluate atrial anatomy.  #. Secondary hypercoagulable state due to atrial fibrillation: -CHADSVASC score of 8. -Continue Eliquis.  #. S/p CRT-D: -In clinic device interrogation performed.  Appropriate function and stable lead parameters. -Device programmed to VVIR in efforts to conserve battery life until ablation can be performed.  #. Chronic systolic heart failure: Last EF 40 to 45%. #.  CAD: No chest pain. -Appears well compensated.  Continue medical management and follow-up with primary cardiologist.  #Hypertension -At goal today.  Recommend checking blood pressures 1-2 times per week at home and recording the values.  Recommend bringing these  recordings to the primary care physician.  Signed, Nobie Putnam, MD

## 2024-01-17 NOTE — Progress Notes (Unsigned)
Electrophysiology Office Note:   Date:  01/19/2024  ID:  Ronnie Dopp., DOB [birth year], MRN [id number]  Primary Cardiologist: Sherryl Manges, MD Electrophysiologist: Sherryl Manges, MD      History of Present Illness:   Ronnie Murguia. is a 83 y.o. 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 [name].  Discussed the use of AI scribe software for clinical note transcription with the patient, who gave verbal consent to proceed.  History of Present Illness   The patient, an 83 year old with a history of atrial fibrillation and a pacemaker, presents for a consultation regarding a potential ablation procedure. The patient has been in persistent atrial fibrillation since September. He underwent cardioversion with ERAF. Despite this, the patient continues to maintain his usual physical activity level, which includes walking over 10,000 steps daily. The patient does not report any fatigue, shortness of breath, dizziness, lightheadedness. He has some occasional palpitations. The patient's pacemaker has been reprogrammed to conserve battery life due to the persistent atrial fibrillation. The patient expresses concern about the long-term implications of remaining in atrial fibrillation.      Review of systems complete and found to be negative unless listed in HPI.   EP Information / Studies Reviewed:    EKG is not ordered today. EKG from 12/29/23 reviewed which showed ventricular paced rhythm.     Zio Monitor 09/2023:  HR 60 - 123, average 68 bpm. Rare ventricular ectopy. 100% AF burden.  Echo 12/27/23:  1. Left ventricular ejection fraction, by estimation, is 40 to 45%. The  left ventricle has mildly decreased function. The left ventricle  demonstrates global hypokinesis. Left ventricular diastolic parameters are   indeterminate. The average left  ventricular global longitudinal strain is -13.8 %.   2. Right ventricular systolic function is mildly reduced. The right  ventricular size is normal. There is mildly elevated pulmonary artery  systolic pressure. The estimated right ventricular systolic pressure is  43.9 mmHg.   3. Left atrial size was mild to moderately dilated.   4. Right atrial size was mildly dilated.   5. The mitral valve is normal in structure. Mild mitral valve  regurgitation. No evidence of mitral stenosis.   6. The aortic valve is normal in structure. Aortic valve regurgitation is  mild. Aortic valve sclerosis/calcification is present, without any  evidence of aortic stenosis.   7. The inferior vena cava is normal in size with greater than 50%  respiratory variability, suggesting right atrial pressure of 3 mmHg.   Risk Assessment/Calculations:    CHA2DS2-VASc Score = 8   This indicates a 10.8% annual risk of stroke. The patient's score is based upon: CHF History: 1 HTN History: 1 Diabetes History: 1 Stroke History: 2 Vascular Disease History: 1 Age Score: 2 Gender Score: 0             Physical Exam:   VS:  BP 118/72   Pulse 68   Ht 5\' 5"  (1.651 m)   Wt 184 lb (83.5 kg)   SpO2 98%   BMI 30.62 kg/m    Wt Readings from Last 3 Encounters:  01/18/24 184 lb (83.5 kg)  01/04/24 175 lb (79.4 kg)  12/29/23 183 lb 2 oz (83.1 kg)     GEN: Well nourished, well developed in no acute distress NECK: No JVD CARDIAC: Normal  rate and regular rhythm RESPIRATORY:  Clear to auscultation without rales, wheezing or rhonchi  ABDOMEN: Soft, non-distended EXTREMITIES:  No edema; No deformity   ASSESSMENT AND PLAN:   Ronnie Remer. is a 83 y.o. 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 [name].  #.  Persistent atrial fibrillation: He has had cardioversion with early recurrence.  He is concerned about the long-term issues associated with permanent atrial fibrillation, potentially higher stroke risk and development of worsening heart failure; he has known LV dysfunction.  For this reason, he he would like to pursue a rhythm control strategy, which I think is reasonable.  -We discussed antiarrhythmic drug therapy with Tikosyn versus pursuing catheter ablation.Discussed risks, recovery and likelihood of success with each treatment strategy. Risk, benefits, and alternatives to EP study and ablation for afib were discussed. These risks include but are not limited to stroke, bleeding, vascular damage, tamponade, perforation, damage to the esophagus, lungs, phrenic nerve and other structures, pulmonary vein stenosis, worsening renal function, coronary vasospasm and death.  Discussed potential need for repeat ablation procedures and antiarrhythmic drugs after an initial ablation. The patient understands these risk and wishes to proceed.  We will therefore proceed with catheter ablation at the next available time.  Carto, ICE, anesthesia are requested for the procedure.  Will also obtain CT PV protocol prior to the procedure to exclude LAA thrombus and further evaluate atrial anatomy.  #. Secondary hypercoagulable state due to atrial fibrillation: -CHADSVASC score of 8. -Continue Eliquis.  #. S/p CRT-D: -In clinic device interrogation performed.  Appropriate function and stable lead parameters. -Device programmed to VVIR in efforts to conserve battery life until ablation can be performed.  #. Chronic systolic heart failure: Last EF 40 to 45%. #.  CAD: No chest pain. -Appears well compensated.  Continue medical management and follow-up with primary cardiologist.  #Hypertension -At goal today.  Recommend checking blood pressures 1-2 times per week at home and recording the values.  Recommend bringing these  recordings to the primary care physician.  Signed, Nobie Putnam, MD

## 2024-01-18 ENCOUNTER — Encounter: Payer: Self-pay | Admitting: Cardiology

## 2024-01-18 ENCOUNTER — Ambulatory Visit: Payer: Medicare Other | Attending: Cardiology | Admitting: Cardiology

## 2024-01-18 VITALS — BP 118/72 | HR 68 | Ht 65.0 in | Wt 184.0 lb

## 2024-01-18 DIAGNOSIS — I10 Essential (primary) hypertension: Secondary | ICD-10-CM | POA: Diagnosis present

## 2024-01-18 DIAGNOSIS — I2510 Atherosclerotic heart disease of native coronary artery without angina pectoris: Secondary | ICD-10-CM | POA: Diagnosis not present

## 2024-01-18 DIAGNOSIS — D6869 Other thrombophilia: Secondary | ICD-10-CM | POA: Insufficient documentation

## 2024-01-18 DIAGNOSIS — I5022 Chronic systolic (congestive) heart failure: Secondary | ICD-10-CM | POA: Diagnosis not present

## 2024-01-18 DIAGNOSIS — Z95810 Presence of automatic (implantable) cardiac defibrillator: Secondary | ICD-10-CM | POA: Diagnosis not present

## 2024-01-18 DIAGNOSIS — I4819 Other persistent atrial fibrillation: Secondary | ICD-10-CM | POA: Diagnosis present

## 2024-01-18 NOTE — Patient Instructions (Addendum)
Medication Instructions:  Your physician recommends that you continue on your current medications as directed. Please refer to the Current Medication list given to you today.  *If you need a refill on your cardiac medications before your next appointment, please call your pharmacy*   Lab Work: BMET and CBC   Testing/Procedures: Cardiac CT Your physician has requested that you have cardiac CT. Cardiac computed tomography (CT) is a painless test that uses an x-ray machine to take clear, detailed pictures of your heart. For further information please visit [email or web address] Please follow instruction sheet as given. We will call you to schedule your CT scan. It will be done about three weeks prior to your ablation.   Ablation  Your physician has recommended that you have an ablation. Catheter ablation is a medical procedure used to treat some cardiac arrhythmias (irregular heartbeats). During catheter ablation, a long, thin, flexible tube is put into a blood vessel in your groin (upper thigh), or neck. This tube is called an ablation catheter. It is then guided to your heart through the blood vessel. Radio frequency waves destroy small areas of heart tissue where abnormal heartbeats may cause an arrhythmia to start. Please see the instruction sheet given to you today.  Follow-Up: At [facility], you and your health needs are our priority.  As part of our continuing mission to provide you with exceptional heart care, we have created designated Provider Care Teams.  These Care Teams include your primary Cardiologist (physician) and Advanced Practice Providers (APPs -  Physician Assistants and Nurse Practitioners) who all work together to provide you with the care you need, when you need it.  Your next appointment:   We will call you to arrange your follow up appointments.

## 2024-01-24 ENCOUNTER — Telehealth: Payer: Self-pay | Admitting: Cardiology

## 2024-01-24 NOTE — Telephone Encounter (Signed)
Discussed with [employer] rep.  Agreement that mode change should not cause stim.  Outreach made to Pt.  He states he feels the sensation more strongly when he leans forward but it is there all the time.  Pt scheduled with device clinic for 01/25/2024 at 11:30 am.  Request [employer] assistance.

## 2024-01-24 NOTE — Telephone Encounter (Signed)
Returned call to Pt.  He complains of a thumping in his side since his office visit on 01/18/2024.  Per review of the OV notes, Pt was reprogrammed VVI, which should not have caused him to start having stim.    Pt is not home right now, but when he gets home he will send a transmission and call the device clinic.  Direct # given.  Await call back.

## 2024-01-24 NOTE — Telephone Encounter (Signed)
Call back received from Pt.  He has sent manual transmission.  He is on his way to his PCP.  Advised would return his call later this afternoon.  Transmission reviewed.  Per review of previous transmissions and transmission received today-no programming changes have been made other than the mode switch from DDIR 60 to VVIR 60.  Unable to determine why Pt would be experiencing stim after this change.  Will discuss further with [employer] representative and determine best course forward.

## 2024-01-24 NOTE — Telephone Encounter (Signed)
  1. Has your device fired? No   2. Is you device beeping? no  3. Are you experiencing draining or swelling at device site? no  4. Are you calling to see if we received your device transmission? no  5. Have you passed out? No  Pt is having spasms in his rib cage since we did adjustments to his pacemaker 01/17/24   Please route to Device Clinic Pool

## 2024-01-25 ENCOUNTER — Ambulatory Visit: Payer: Medicare Other | Attending: Internal Medicine

## 2024-01-25 LAB — CUP PACEART INCLINIC DEVICE CHECK
Date Time Interrogation Session: 20250204125121
Implantable Lead Connection Status: 753985
Implantable Lead Connection Status: 753985
Implantable Lead Connection Status: 753985
Implantable Lead Implant Date: 20070615
Implantable Lead Implant Date: 20120813
Implantable Lead Implant Date: 20120813
Implantable Lead Location: 753858
Implantable Lead Location: 753859
Implantable Lead Location: 753860
Implantable Lead Model: 4396
Implantable Lead Model: 5076
Implantable Lead Model: 7121
Implantable Pulse Generator Implant Date: 20230816

## 2024-01-25 NOTE — Progress Notes (Signed)
 Patient in to clinic today for eval of CRT device after reporting diaphragmatic stim since last programming changes made.   [place]ell, [employer] rep, present to assist with interrogation.  Device function is normal.   LV VECTOR CHECK: LV tip to LV Coil:  1.50 @ 1.0 with PHRENIC, goes away at 1.75. LV tip to LV Ring:  2.00 @ 1.00 with PHRENIC LV ring to RV Coil:  1.25 @ 1.0 (Current programming) - determined best option, unable to reproduce phrenic in the office with lying/movement positioning tests.  LV ring to LV tip:  2.25@1 .0ms without phrenic nerve stim.  PROGRAMMING OUTCOME:   LV ring to LV tip is another viable option; however, would burn through battery much faster.  LV ring to RV Coil - current setting determined best but did lower programming output from 2.0@1 .0 to 1.75v@1 .0 to attempt first before resorting to higher vector output.    If patient continues to have stim, we do have option with the LV ring -tip vector but know we will be sacrificing battery life.

## 2024-01-26 ENCOUNTER — Ambulatory Visit
Admission: RE | Admit: 2024-01-26 | Discharge: 2024-01-26 | Disposition: A | Discharge status: Home / Self Care | Payer: Medicare Other | Source: Ambulatory Visit | Attending: Cardiology | Admitting: Cardiology

## 2024-01-26 DIAGNOSIS — I4819 Other persistent atrial fibrillation: Secondary | ICD-10-CM | POA: Diagnosis present

## 2024-01-26 MED ORDER — SODIUM CHLORIDE 0.9 % IV BOLUS
250.0000 mL | Freq: Once | INTRAVENOUS | Status: AC
Start: 2024-01-26 — End: 2024-01-26
  Administered 2024-01-26: 250 mL via INTRAVENOUS

## 2024-01-26 MED ORDER — IOHEXOL 350 MG/ML SOLN
100.0000 mL | Freq: Once | INTRAVENOUS | Status: AC | PRN
  Administered 2024-01-26: 100 mL via INTRAVENOUS

## 2024-01-31 ENCOUNTER — Ambulatory Visit: Payer: Medicare Other | Attending: Internal Medicine

## 2024-01-31 DIAGNOSIS — I5022 Chronic systolic (congestive) heart failure: Secondary | ICD-10-CM

## 2024-01-31 DIAGNOSIS — Z95810 Presence of automatic (implantable) cardiac defibrillator: Secondary | ICD-10-CM | POA: Diagnosis not present

## 2024-02-01 NOTE — Progress Notes (Unsigned)
EPIC Encounter for ICM Monitoring  Patient Name: [name]. is a 83 y.o. male Date: 02/01/2024 Primary Care Physican: Kandyce Rud, MD Primary Cardiologist: Graciela Husbands Electrophysiologist: Joycelyn Schmid Pacing: 99.3%         02/09/2023 Weight: 180 lbs 04/23/2023 Weight: 181 lbs 07/02/2023 Weight: 179-180 lbs 08/06/2023 Weight: 179 lbs 11/30/2023 Weight: 176.3 lbs 01/18/2024 Office Weight: 184 lbs 02/02/2024 Weight: 177 lbs   Since 25-Jan-2024 Time in AT/AF Off                                                            Spoke with patient and heart failure questions reviewed.  Transmission results reviewed.  Pt reports weight gain during decreased impedance but has since returned to baseline.  Diet and fluid intake are same and he is unsure what may be causing fluid accumulation.     Optivol thoracic impedance suggesting possible fluid accumulation starting 1/23.       Prescribed:  Furosemide 80 mg Take 1 tablet (s) (80 mg total) by mouth every OTHER day.   Spironolactone 25 mg take 1 tablet by mouth daily   Labs: 01/17/2024 Creatinine 1.0, BUN 23, Potassium 4.7, Sodium 140, GFR 75 01/19/2023 Creatinine 0.9, BUN 24, Potassium 4.0, Sodium 141, GFR 86 A complete set of results can be found in Results Review.   Recommendations:  Advised to take extra 40 mg in the afternoon x 1 dose on the day he takes 80 mg AM.   Then returned to 80 mg every other day.   Follow-up plan: ICM clinic phone appointment on 02/04/2024 to recheck fluid levels.   91 day device clinic remote transmission 02/03/2024.     EP/Cardiology Office Visits:   Recall 03/28/2024 with Dr Graciela Husbands.     Copy of ICM check sent to Dr. Graciela Husbands.  3 month ICM trend: 01/31/2024.    12-14 Month ICM trend:     Karie Soda, RN 02/01/2024 9:46 AM

## 2024-02-03 ENCOUNTER — Ambulatory Visit (INDEPENDENT_AMBULATORY_CARE_PROVIDER_SITE_OTHER): Payer: Medicare Other

## 2024-02-03 DIAGNOSIS — I442 Atrioventricular block, complete: Secondary | ICD-10-CM | POA: Diagnosis not present

## 2024-02-04 ENCOUNTER — Ambulatory Visit: Payer: Medicare Other | Attending: Internal Medicine

## 2024-02-04 DIAGNOSIS — Z95810 Presence of automatic (implantable) cardiac defibrillator: Secondary | ICD-10-CM

## 2024-02-04 DIAGNOSIS — I5022 Chronic systolic (congestive) heart failure: Secondary | ICD-10-CM

## 2024-02-04 NOTE — Progress Notes (Signed)
EPIC Encounter for ICM Monitoring  Patient Name: [name]. is a 83 y.o. male Date: 02/04/2024 Primary Care Physican: Kandyce Rud, MD Primary Cardiologist: Graciela Husbands Electrophysiologist: Joycelyn Schmid Pacing: 95.4%         02/09/2023 Weight: 180 lbs 04/23/2023 Weight: 181 lbs 07/02/2023 Weight: 179-180 lbs 08/06/2023 Weight: 179 lbs 11/30/2023 Weight: 176.3 lbs 01/18/2024 Office Weight: 184 lbs 02/02/2024 Weight: 177 lbs                                                          Spoke with patient and heart failure questions reviewed.  Transmission results reviewed.  Pt asymptomatic for fluid accumulation.  Reports feeling well at this time and voices no complaints.     Optivol thoracic impedance suggesting fluid levels returned close to normal after taking extra 40 mg x 1.       Prescribed:  Furosemide 80 mg Take 1 tablet (s) (80 mg total) by mouth every OTHER day.   Spironolactone 25 mg take 1 tablet by mouth daily   Labs: 01/17/2024 Creatinine 1.0, BUN 23, Potassium 4.7, Sodium 140, GFR 75 01/19/2023 Creatinine 0.9, BUN 24, Potassium 4.0, Sodium 141, GFR 86 A complete set of results can be found in Results Review.   Recommendations:  No changes and encouraged to call if experiencing any fluid symptoms.   Follow-up plan: ICM clinic phone appointment on 03/06/2024.   91 day device clinic remote transmission 02/03/2024.     EP/Cardiology Office Visits:   Recall 03/28/2024 with Dr Graciela Husbands.     Copy of ICM check sent to Dr. Graciela Husbands.  3 month ICM trend: 02/04/2024.    12-14 Month ICM trend:     Karie Soda, RN 02/04/2024 7:18 AM

## 2024-02-05 LAB — CUP PACEART REMOTE DEVICE CHECK
Battery Remaining Longevity: 65 mo
Battery Voltage: 2.98 V
Brady Statistic AP VP Percent: 0 %
Brady Statistic AP VS Percent: 0 %
Brady Statistic AS VP Percent: 0 %
Brady Statistic AS VS Percent: 0 %
Brady Statistic RA Percent Paced: 0 %
Brady Statistic RV Percent Paced: 96.05 %
Date Time Interrogation Session: 20250213043725
HighPow Impedance: 52 Ohm
HighPow Impedance: 65 Ohm
Implantable Lead Connection Status: 753985
Implantable Lead Connection Status: 753985
Implantable Lead Connection Status: 753985
Implantable Lead Implant Date: 20070615
Implantable Lead Implant Date: 20120813
Implantable Lead Implant Date: 20120813
Implantable Lead Location: 753858
Implantable Lead Location: 753859
Implantable Lead Location: 753860
Implantable Lead Model: 4396
Implantable Lead Model: 5076
Implantable Lead Model: 7121
Implantable Pulse Generator Implant Date: 20230816
Lead Channel Impedance Value: 247 Ohm
Lead Channel Impedance Value: 342 Ohm
Lead Channel Impedance Value: 342 Ohm
Lead Channel Impedance Value: 494 Ohm
Lead Channel Impedance Value: 589 Ohm
Lead Channel Impedance Value: 969 Ohm
Lead Channel Pacing Threshold Amplitude: 1.25 V
Lead Channel Pacing Threshold Amplitude: 1.25 V
Lead Channel Pacing Threshold Pulse Width: 0.4 ms
Lead Channel Pacing Threshold Pulse Width: 1 ms
Lead Channel Sensing Intrinsic Amplitude: 0.375 mV
Lead Channel Sensing Intrinsic Amplitude: 0.375 mV
Lead Channel Setting Pacing Amplitude: 1.75 V
Lead Channel Setting Pacing Amplitude: 2.5 V
Lead Channel Setting Pacing Pulse Width: 0.4 ms
Lead Channel Setting Pacing Pulse Width: 1 ms
Lead Channel Setting Sensing Sensitivity: 0.3 mV
Zone Setting Status: 755011

## 2024-02-16 NOTE — Pre-Procedure Instructions (Signed)
 Attempted to call patient regarding procedure instructions.  Left voice mail on the following items: Arrival time 0515 Nothing to eat or drink after midnight No meds AM of procedure Responsible person to drive you home and stay with you for 24 hrs  Have you missed any doses of anti-coagulant Eliquis- should be taken twice a day, if you have missed any doses please let us know.  Don't take dose in the morning.

## 2024-02-17 ENCOUNTER — Ambulatory Visit (HOSPITAL_COMMUNITY): Payer: Medicare Other

## 2024-02-17 ENCOUNTER — Other Ambulatory Visit: Payer: Self-pay

## 2024-02-17 ENCOUNTER — Ambulatory Visit (HOSPITAL_COMMUNITY)
Admission: RE | Disposition: A | Discharge status: Home / Self Care | Payer: Medicare Other | Source: Home / Self Care | Attending: Cardiology

## 2024-02-17 ENCOUNTER — Ambulatory Visit (HOSPITAL_COMMUNITY)
Admission: RE | Admit: 2024-02-17 | Discharge: 2024-02-17 | Disposition: A | Discharge status: Home / Self Care | Payer: Medicare Other | Attending: Cardiology | Admitting: Cardiology

## 2024-02-17 ENCOUNTER — Ambulatory Visit (HOSPITAL_BASED_OUTPATIENT_CLINIC_OR_DEPARTMENT_OTHER): Payer: Medicare Other

## 2024-02-17 ENCOUNTER — Encounter: Payer: Self-pay | Admitting: Emergency Medicine

## 2024-02-17 DIAGNOSIS — I110 Hypertensive heart disease with heart failure: Secondary | ICD-10-CM | POA: Diagnosis not present

## 2024-02-17 DIAGNOSIS — I2510 Atherosclerotic heart disease of native coronary artery without angina pectoris: Secondary | ICD-10-CM | POA: Insufficient documentation

## 2024-02-17 DIAGNOSIS — I255 Ischemic cardiomyopathy: Secondary | ICD-10-CM | POA: Insufficient documentation

## 2024-02-17 DIAGNOSIS — I4891 Unspecified atrial fibrillation: Secondary | ICD-10-CM

## 2024-02-17 DIAGNOSIS — E119 Type 2 diabetes mellitus without complications: Secondary | ICD-10-CM | POA: Diagnosis not present

## 2024-02-17 DIAGNOSIS — Z95810 Presence of automatic (implantable) cardiac defibrillator: Secondary | ICD-10-CM | POA: Diagnosis not present

## 2024-02-17 DIAGNOSIS — I5030 Unspecified diastolic (congestive) heart failure: Secondary | ICD-10-CM

## 2024-02-17 DIAGNOSIS — I484 Atypical atrial flutter: Secondary | ICD-10-CM

## 2024-02-17 DIAGNOSIS — Z7901 Long term (current) use of anticoagulants: Secondary | ICD-10-CM | POA: Insufficient documentation

## 2024-02-17 DIAGNOSIS — I4819 Other persistent atrial fibrillation: Secondary | ICD-10-CM | POA: Diagnosis present

## 2024-02-17 DIAGNOSIS — I5022 Chronic systolic (congestive) heart failure: Secondary | ICD-10-CM | POA: Insufficient documentation

## 2024-02-17 DIAGNOSIS — D6869 Other thrombophilia: Secondary | ICD-10-CM | POA: Diagnosis not present

## 2024-02-17 DIAGNOSIS — Z955 Presence of coronary angioplasty implant and graft: Secondary | ICD-10-CM | POA: Insufficient documentation

## 2024-02-17 HISTORY — PX: ATRIAL FIBRILLATION ABLATION: EP1191

## 2024-02-17 LAB — POCT ACTIVATED CLOTTING TIME
Activated Clotting Time: 256 s
Activated Clotting Time: 291 s
Activated Clotting Time: 302 s

## 2024-02-17 LAB — GLUCOSE, CAPILLARY
Glucose-Capillary: 99 mg/dL (ref 70–99)
Glucose-Capillary: 154 mg/dL — ABNORMAL HIGH (ref 70–99)

## 2024-02-17 SURGERY — ATRIAL FIBRILLATION ABLATION
Anesthesia: General

## 2024-02-17 MED ORDER — HEPARIN (PORCINE) IN NACL 1000-0.9 UT/500ML-% IV SOLN
INTRAVENOUS | Status: DC | PRN
  Administered 2024-02-17 (×3): 500 mL

## 2024-02-17 MED ORDER — ROCURONIUM BROMIDE 10 MG/ML (PF) SYRINGE
PREFILLED_SYRINGE | INTRAVENOUS | Status: DC | PRN
  Administered 2024-02-17: 10 mg via INTRAVENOUS
  Administered 2024-02-17: 20 mg via INTRAVENOUS
  Administered 2024-02-17: 10 mg via INTRAVENOUS
  Administered 2024-02-17: 20 mg via INTRAVENOUS
  Administered 2024-02-17: 50 mg via INTRAVENOUS
  Administered 2024-02-17: 10 mg via INTRAVENOUS

## 2024-02-17 MED ORDER — CEFAZOLIN SODIUM-DEXTROSE 2-4 GM/100ML-% IV SOLN
INTRAVENOUS | Status: AC
  Filled 2024-02-17: qty 100

## 2024-02-17 MED ORDER — SUCCINYLCHOLINE CHLORIDE 200 MG/10ML IV SOSY
PREFILLED_SYRINGE | INTRAVENOUS | Status: DC | PRN
  Administered 2024-02-17: 80 mg via INTRAVENOUS

## 2024-02-17 MED ORDER — ATROPINE SULFATE 1 MG/10ML IJ SOSY
PREFILLED_SYRINGE | INTRAMUSCULAR | Status: AC
  Filled 2024-02-17: qty 10

## 2024-02-17 MED ORDER — PROTAMINE SULFATE 10 MG/ML IV SOLN
INTRAVENOUS | Status: DC | PRN
  Administered 2024-02-17: 35 mg via INTRAVENOUS

## 2024-02-17 MED ORDER — PHENYLEPHRINE HCL-NACL 20-0.9 MG/250ML-% IV SOLN
INTRAVENOUS | Status: DC | PRN
  Administered 2024-02-17: 20 ug/min via INTRAVENOUS

## 2024-02-17 MED ORDER — SODIUM CHLORIDE 0.9% FLUSH: 3.0000 mL | INTRAVENOUS | Status: DC | PRN

## 2024-02-17 MED ORDER — CEFAZOLIN SODIUM-DEXTROSE 2-4 GM/100ML-% IV SOLN
2.0000 g | Freq: Once | INTRAVENOUS | Status: AC
  Administered 2024-02-17: 2 g via INTRAVENOUS

## 2024-02-17 MED ORDER — LIDOCAINE 2% (20 MG/ML) 5 ML SYRINGE
INTRAMUSCULAR | Status: DC | PRN
  Administered 2024-02-17: 40 mg via INTRAVENOUS

## 2024-02-17 MED ORDER — ACETAMINOPHEN 325 MG PO TABS: 650.0000 mg | ORAL_TABLET | ORAL | Status: DC | PRN

## 2024-02-17 MED ORDER — PROPOFOL 10 MG/ML IV BOLUS
INTRAVENOUS | Status: DC | PRN
  Administered 2024-02-17: 120 mg via INTRAVENOUS

## 2024-02-17 MED ORDER — SODIUM CHLORIDE 0.9 % IV SOLN: 250.0000 mL | INTRAVENOUS | Status: DC | PRN

## 2024-02-17 MED ORDER — HEPARIN SODIUM (PORCINE) 1000 UNIT/ML IJ SOLN
INTRAMUSCULAR | Status: DC | PRN
  Administered 2024-02-17: 1000 [IU] via INTRAVENOUS

## 2024-02-17 MED ORDER — SUGAMMADEX SODIUM 200 MG/2ML IV SOLN
INTRAVENOUS | Status: DC | PRN
  Administered 2024-02-17: 200 mg via INTRAVENOUS

## 2024-02-17 MED ORDER — SODIUM CHLORIDE 0.9 % IV SOLN: INTRAVENOUS | Status: DC

## 2024-02-17 MED ORDER — HEPARIN SODIUM (PORCINE) 1000 UNIT/ML IJ SOLN
INTRAMUSCULAR | Status: DC | PRN
  Administered 2024-02-17: 5000 [IU] via INTRAVENOUS
  Administered 2024-02-17: 2000 [IU] via INTRAVENOUS
  Administered 2024-02-17: 4000 [IU] via INTRAVENOUS
  Administered 2024-02-17: 14000 [IU] via INTRAVENOUS

## 2024-02-17 MED ORDER — ROCURONIUM BROMIDE 10 MG/ML (PF) SYRINGE: PREFILLED_SYRINGE | INTRAVENOUS | Status: DC | PRN

## 2024-02-17 MED ORDER — FENTANYL CITRATE (PF) 250 MCG/5ML IJ SOLN
INTRAMUSCULAR | Status: DC | PRN
  Administered 2024-02-17 (×2): 50 ug via INTRAVENOUS

## 2024-02-17 MED ORDER — DEXAMETHASONE SODIUM PHOSPHATE 10 MG/ML IJ SOLN
INTRAMUSCULAR | Status: DC | PRN
  Administered 2024-02-17: 10 mg via INTRAVENOUS

## 2024-02-17 MED ORDER — FENTANYL CITRATE (PF) 100 MCG/2ML IJ SOLN
INTRAMUSCULAR | Status: AC
  Filled 2024-02-17: qty 2

## 2024-02-17 MED ORDER — PROPOFOL 500 MG/50ML IV EMUL
INTRAVENOUS | Status: DC | PRN
  Administered 2024-02-17: 50 ug/kg/min via INTRAVENOUS

## 2024-02-17 MED ORDER — HEPARIN SODIUM (PORCINE) 1000 UNIT/ML IJ SOLN
INTRAMUSCULAR | Status: AC
  Filled 2024-02-17: qty 10

## 2024-02-17 MED ORDER — ONDANSETRON HCL 4 MG/2ML IJ SOLN
INTRAMUSCULAR | Status: DC | PRN
  Administered 2024-02-17: 4 mg via INTRAVENOUS

## 2024-02-17 MED ORDER — SODIUM CHLORIDE 0.9% FLUSH: 3.0000 mL | Freq: Two times a day (BID) | INTRAVENOUS | Status: DC

## 2024-02-17 MED ORDER — ONDANSETRON HCL 4 MG/2ML IJ SOLN: 4.0000 mg | Freq: Four times a day (QID) | INTRAMUSCULAR | Status: DC | PRN

## 2024-02-17 MED ORDER — APIXABAN 5 MG PO TABS: 5.0000 mg | ORAL_TABLET | Freq: Once | ORAL | Status: DC

## 2024-02-17 SURGICAL SUPPLY — 27 items
BAG SNAP BAND KOVER 36X36 (MISCELLANEOUS) IMPLANT
BLANKET WARM UNDERBOD FULL ACC (MISCELLANEOUS) ×2 IMPLANT
CABLE PFA RX CATH CONN (CABLE) IMPLANT
CATH FARAWAVE ABLATION 31 (CATHETERS) IMPLANT
CATH OCTARAY 2.0 F 3-3-3-3-3 (CATHETERS) IMPLANT
CATH SMTCH THERMOCOOL SF DF (CATHETERS) IMPLANT
CATH SMTCH THERMOCOOL SF FJ (CATHETERS) IMPLANT
CATH SOUNDSTAR ECO 8FR (CATHETERS) IMPLANT
CATH WEB BI DIR CSDF CRV REPRO (CATHETERS) IMPLANT
CATH WEBSTER BI DIR CS D-F CRV (CATHETERS) IMPLANT
CLOSURE PERCLOSE PROSTYLE (VASCULAR PRODUCTS) IMPLANT
COVER SWIFTLINK CONNECTOR (BAG) ×2 IMPLANT
DEVICE CLOSURE MYNXGRIP 6/7F (Vascular Products) IMPLANT
DILATOR VESSEL 38 20CM 16FR (INTRODUCER) IMPLANT
GUIDEWIRE INQWIRE 1.5J.035X260 (WIRE) IMPLANT
INQWIRE 1.5J .035X260CM (WIRE) ×2 IMPLANT
KIT VERSACROSS CNCT FARADRIVE (KITS) IMPLANT
PACK EP LF (CUSTOM PROCEDURE TRAY) ×2 IMPLANT
PAD DEFIB RADIO PHYSIO CONN (PAD) ×2 IMPLANT
PATCH CARTO3 (PAD) IMPLANT
SHEATH FARADRIVE STEERABLE (SHEATH) IMPLANT
SHEATH FAST CATH 14F (SHEATH) IMPLANT
SHEATH PINNACLE 8F 10CM (SHEATH) IMPLANT
SHEATH PINNACLE 9F 10CM (SHEATH) IMPLANT
SHEATH PROBE COVER 6X72 (BAG) IMPLANT
TUBING SMART ABLATE COOLFLOW (TUBING) IMPLANT
WIRE HI TORQ VERSACORE-J 145CM (WIRE) IMPLANT

## 2024-02-17 NOTE — Discharge Instructions (Signed)

## 2024-02-17 NOTE — Interval H&P Note (Signed)
 History and Physical Interval Note:  02/17/2024 7:26 AM  [name].  has presented today for surgery, with the diagnosis of symptomatic persistent atrial fibrillation and chronic systolic heart failure.  The various methods of treatment have been discussed with the patient and family. After consideration of risks, benefits and other options for treatment, the patient has consented to  Procedure(s): ATRIAL FIBRILLATION ABLATION (N/A) as a surgical intervention.  The patient's history has been reviewed, patient examined, no change in status, stable for surgery.  I have reviewed the patient's chart and labs.  Questions were answered to the patient's satisfaction.     [name]

## 2024-02-17 NOTE — Transfer of Care (Signed)
 Immediate Anesthesia Transfer of Care Note  Patient: [name].  Procedure(s) Performed: ATRIAL FIBRILLATION ABLATION  Patient Location: PACU and Cath Lab  Anesthesia Type:General  Level of Consciousness: awake, alert , and oriented  Airway & Oxygen Therapy: Patient Spontanous Breathing and Patient connected to nasal cannula oxygen  Post-op Assessment: Report given to RN and Post -op Vital signs reviewed and stable  Post vital signs: stable  Last Vitals:  Vitals Value Taken Time  BP    Temp    Pulse    Resp    SpO2      Last Pain:  Vitals:   02/17/24 0604  TempSrc:   PainSc: 1       Patients Stated Pain Goal: 1 (02/17/24 0604)  Complications: No notable events documented.

## 2024-02-17 NOTE — Anesthesia Procedure Notes (Signed)
 Procedure Name: Intubation Date/Time: 02/17/2024 8:11 AM  Performed by: Vena Austria, CRNAPre-anesthesia Checklist: Patient identified, Emergency Drugs available, Suction available, Patient being monitored and Timeout performed Patient Re-evaluated:Patient Re-evaluated prior to induction Oxygen Delivery Method: Circle system utilized Preoxygenation: Pre-oxygenation with 100% oxygen Induction Type: IV induction Laryngoscope Size: McGrath and 3 Grade View: Grade I Tube size: 6.5 mm Airway Equipment and Method: Stylet and Video-laryngoscopy Placement Confirmation: ETT inserted through vocal cords under direct vision, positive ETCO2, CO2 detector and breath sounds checked- equal and bilateral Secured at: 21 cm Tube secured with: Tape Dental Injury: Teeth and Oropharynx as per pre-operative assessment

## 2024-02-17 NOTE — Anesthesia Postprocedure Evaluation (Signed)
 Anesthesia Post Note  Patient: [name].  Procedure(s) Performed: ATRIAL FIBRILLATION ABLATION     Patient location during evaluation: PACU Anesthesia Type: General Level of consciousness: awake and alert Pain management: pain level controlled Vital Signs Assessment: post-procedure vital signs reviewed and stable Respiratory status: spontaneous breathing, nonlabored ventilation, respiratory function stable and patient connected to nasal cannula oxygen Cardiovascular status: blood pressure returned to baseline and stable Postop Assessment: no apparent nausea or vomiting Anesthetic complications: no  No notable events documented.  Last Vitals:  Vitals:   02/17/24 1240 02/17/24 1245  BP: 96/64 (!) 100/59  Pulse: 60 60  Resp: 13 14  Temp:    SpO2: 96%     Last Pain:  Vitals:   02/17/24 1245  TempSrc:   PainSc: 0-No pain                 Shelton Silvas

## 2024-02-17 NOTE — Anesthesia Preprocedure Evaluation (Addendum)
 Anesthesia Evaluation  Patient identified by MRN, date of birth, ID band Patient awake    Reviewed: Allergy & Precautions, NPO status , Patient's Chart, lab work & pertinent test results  Airway Mallampati: II  TM Distance: >3 FB Neck ROM: Full    Dental  (+) Teeth Intact, Dental Advisory Given   Pulmonary asthma    breath sounds clear to auscultation       Cardiovascular hypertension, Pt. on medications + CAD, + Past MI and +CHF  + dysrhythmias Atrial Fibrillation + pacemaker + Cardiac Defibrillator  Rhythm:Regular Rate:Normal  Echo:   1. Left ventricular ejection fraction, by estimation, is 40 to 45%. The  left ventricle has mildly decreased function. The left ventricle  demonstrates global hypokinesis. Left ventricular diastolic parameters are  indeterminate. The average left  ventricular global longitudinal strain is -13.8 %.   2. Right ventricular systolic function is mildly reduced. The right  ventricular size is normal. There is mildly elevated pulmonary artery  systolic pressure. The estimated right ventricular systolic pressure is  43.9 mmHg.   3. Left atrial size was mild to moderately dilated.   4. Right atrial size was mildly dilated.   5. The mitral valve is normal in structure. Mild mitral valve  regurgitation. No evidence of mitral stenosis.   6. The aortic valve is normal in structure. Aortic valve regurgitation is  mild. Aortic valve sclerosis/calcification is present, without any  evidence of aortic stenosis.   7. The inferior vena cava is normal in size with greater than 50%  respiratory variability, suggesting right atrial pressure of 3 mmHg.     Neuro/Psych TIA Neuromuscular disease  negative psych ROS   GI/Hepatic Neg liver ROS,GERD  Medicated,,  Endo/Other  diabetes, Type 2, Oral Hypoglycemic Agents    Renal/GU Renal disease     Musculoskeletal  (+) Arthritis ,    Abdominal   Peds   Hematology  (+) Blood dyscrasia, anemia   Anesthesia Other Findings   Reproductive/Obstetrics                             Anesthesia Physical Anesthesia Plan  ASA: 3  Anesthesia Plan: General   Post-op Pain Management: Tylenol PO (pre-op)*   Induction: Intravenous  PONV Risk Score and Plan: 2 and Ondansetron and Treatment may vary due to age or medical condition  Airway Management Planned: Oral ETT  Additional Equipment: None  Intra-op Plan:   Post-operative Plan: Extubation in OR  Informed Consent: I have reviewed the patients History and Physical, chart, labs and discussed the procedure including the risks, benefits and alternatives for the proposed anesthesia with the patient or authorized representative who has indicated his/her understanding and acceptance.     Dental advisory given  Plan Discussed with: CRNA  Anesthesia Plan Comments:        Anesthesia Quick Evaluation

## 2024-02-18 ENCOUNTER — Encounter (HOSPITAL_COMMUNITY): Payer: Self-pay

## 2024-02-18 ENCOUNTER — Telehealth (HOSPITAL_COMMUNITY): Payer: Self-pay

## 2024-02-18 ENCOUNTER — Encounter (HOSPITAL_COMMUNITY): Payer: Self-pay | Admitting: Cardiology

## 2024-02-18 MED FILL — Fentanyl Citrate Preservative Free (PF) Inj 100 MCG/2ML: INTRAMUSCULAR | Qty: 2 | Status: AC

## 2024-02-18 MED FILL — Atropine Sulfate Soln Prefill Syr 1 MG/10ML (0.1 MG/ML): INTRAMUSCULAR | Qty: 10 | Status: AC

## 2024-02-18 NOTE — Telephone Encounter (Signed)
 Per Arita Miss. PA-C, patient should just keep area clean and dry. Could do cool rag or aloe (treat similar to a sunburn). Would avoid hydrocortisone.   Informed patient of provider recommendations. H He also mentioned that he did not receive any discharge instructions. Apologized to patient and offered to send information via Mychart. He voiced understanding and expressed appreciation for the information.

## 2024-02-18 NOTE — Telephone Encounter (Signed)
 Spoke with patient to complete post procedure follow up call.   Patient reports no complications with groin sites. He c/o a skin burn where the defibrillator pad was placed on his back and inquires if there is anything he can use to treat area. Will discuss with APP for recommendations.   Instructions reviewed with patient:  Remove large bandage at puncture site after 24 hours. It is normal to have bruising, tenderness and a pea or marble sized lump/knot at the groin site which can take up to three months to resolve.  Get help right away if you notice sudden swelling at the puncture site.  Check your puncture site every day for signs of infection: fever, redness, swelling, pus drainage, warmth, foul odor or excessive pain. If this occurs, please call the office at [phone], to speak with the nurse. Get help right away if your puncture site is bleeding and the bleeding does not stop after applying firm pressure to the area.  You may continue to have skipped beats/ atrial fibrillation during the first several months after your procedure.  It is very important not to miss any doses of your blood thinner Eliquis. Patient restarted taking this medication on yesterday.    You will follow up with the Afib clinic on 03/16/24 and follow up with the APP on 05/19/24.   Patient verbalized understanding to all instructions provided.

## 2024-02-21 ENCOUNTER — Encounter: Payer: Self-pay | Admitting: Internal Medicine

## 2024-03-06 ENCOUNTER — Ambulatory Visit: Payer: Medicare Other | Attending: Internal Medicine

## 2024-03-06 DIAGNOSIS — Z95810 Presence of automatic (implantable) cardiac defibrillator: Secondary | ICD-10-CM | POA: Diagnosis not present

## 2024-03-06 DIAGNOSIS — I5032 Chronic diastolic (congestive) heart failure: Secondary | ICD-10-CM

## 2024-03-08 NOTE — Addendum Note (Signed)
 Addended by: Elease Etienne A on: 03/08/2024 12:09 PM   Modules accepted: Orders

## 2024-03-08 NOTE — Progress Notes (Signed)
 Remote ICD transmission.

## 2024-03-10 NOTE — Progress Notes (Signed)
 EPIC Encounter for ICM Monitoring  Patient Name: [name]. is a 83 y.o. male Date: 03/10/2024 Primary Care Physican: Kandyce Rud, MD Primary Cardiologist: Graciela Husbands Electrophysiologist: Joycelyn Schmid Pacing: 97.3%         02/09/2023 Weight: 180 lbs 04/23/2023 Weight: 181 lbs 07/02/2023 Weight: 179-180 lbs 08/06/2023 Weight: 179 lbs 11/30/2023 Weight: 176.3 lbs 01/18/2024 Office Weight: 184 lbs 02/02/2024 Weight: 177 lbs 03/10/2024 Weight: 178-180 lbs                                                          Spoke with patient and heart failure questions reviewed.  Transmission results reviewed.  Pt asymptomatic for fluid accumulation.  He did receive a lot of fluid during ablation and gained 4 lbs during that time but returned to baseline several days later which correlates with Optivol.    Optivol thoracic impedance suggesting normal fluid levels with the exception of possible fluid accumulation from 2/25-3/8.    02/17/2024 Afib ablation procedure    Prescribed:  Furosemide 80 mg Take 1 tablet (s) (80 mg total) by mouth every OTHER day.   Spironolactone 25 mg take 1 tablet by mouth daily   Labs: 01/17/2024 Creatinine 1.0, BUN 23, Potassium 4.7, Sodium 140, GFR 75 01/19/2023 Creatinine 0.9, BUN 24, Potassium 4.0, Sodium 141, GFR 86 A complete set of results can be found in Results Review.   Recommendations:  No changes and encouraged to call if experiencing any fluid symptoms.   Follow-up plan: ICM clinic phone appointment on 04/10/2024.   91 day device clinic remote transmission 05/04/2024.     EP/Cardiology Office Visits:   05/19/2024 with Otilio Saber, PA.  Recall 03/28/2024 with Dr Graciela Husbands.     Copy of ICM check sent to Dr. Graciela Husbands.   3 month ICM trend: 03/06/2024.    12-14 Month ICM trend:     Karie Soda, RN 03/10/2024 10:05 AM

## 2024-03-16 ENCOUNTER — Ambulatory Visit (HOSPITAL_COMMUNITY): Payer: Medicare Other | Admitting: Internal Medicine

## 2024-03-20 ENCOUNTER — Ambulatory Visit (HOSPITAL_COMMUNITY)
Admission: RE | Admit: 2024-03-20 | Discharge: 2024-03-20 | Disposition: A | Discharge status: Home / Self Care | Source: Ambulatory Visit | Attending: Internal Medicine | Admitting: Internal Medicine

## 2024-03-20 VITALS — BP 110/72 | HR 62 | Ht 65.0 in | Wt 182.6 lb

## 2024-03-20 DIAGNOSIS — I2510 Atherosclerotic heart disease of native coronary artery without angina pectoris: Secondary | ICD-10-CM | POA: Diagnosis not present

## 2024-03-20 DIAGNOSIS — Z95810 Presence of automatic (implantable) cardiac defibrillator: Secondary | ICD-10-CM | POA: Diagnosis not present

## 2024-03-20 DIAGNOSIS — I5022 Chronic systolic (congestive) heart failure: Secondary | ICD-10-CM | POA: Insufficient documentation

## 2024-03-20 DIAGNOSIS — I255 Ischemic cardiomyopathy: Secondary | ICD-10-CM | POA: Diagnosis present

## 2024-03-20 DIAGNOSIS — I4819 Other persistent atrial fibrillation: Secondary | ICD-10-CM | POA: Diagnosis not present

## 2024-03-20 DIAGNOSIS — Z955 Presence of coronary angioplasty implant and graft: Secondary | ICD-10-CM | POA: Diagnosis not present

## 2024-03-20 DIAGNOSIS — E119 Type 2 diabetes mellitus without complications: Secondary | ICD-10-CM | POA: Insufficient documentation

## 2024-03-20 DIAGNOSIS — Z7901 Long term (current) use of anticoagulants: Secondary | ICD-10-CM | POA: Diagnosis not present

## 2024-03-20 DIAGNOSIS — I110 Hypertensive heart disease with heart failure: Secondary | ICD-10-CM | POA: Insufficient documentation

## 2024-03-20 DIAGNOSIS — D6869 Other thrombophilia: Secondary | ICD-10-CM | POA: Diagnosis not present

## 2024-03-20 DIAGNOSIS — R55 Syncope and collapse: Secondary | ICD-10-CM | POA: Diagnosis present

## 2024-03-20 NOTE — Progress Notes (Signed)
 Primary Care Physician: Kandyce Rud, MD Primary Cardiologist: Sherryl Manges, MD Electrophysiologist: Sherryl Manges, MD     Referring Physician: Dr. Tempie Donning. is a 83 y.o. 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 Patient is on Eliquis for a CHADS2VASC score of 5.  On evaluation today, patient is currently in AV paced rhythm. S/p Afib/flutter ablation on 02/17/24 by Dr. Jimmey Ralph. He has continued to be active logging about ~10,000 steps daily. He notes that previously he did not have cardiac awareness of Afib. No chest pain or SOB. Leg sites healed without issue. No missed doses of Eliquis 5 mg BID.   Today, he denies symptoms of orthopnea, PND, lower extremity edema, dizziness, presyncope, syncope, snoring, daytime somnolence, bleeding, or neurologic sequela. The patient is tolerating medications without difficulties and is otherwise without complaint today.    he has a BMI of Body mass index is 30.39 kg/m.[name] Filed Weights   03/20/24 1359  Weight: 82.8 kg    Current Outpatient Medications  Medication Sig Dispense Refill   acetaminophen (TYLENOL) 650 MG CR tablet Take 650 mg by mouth 2 (two) times daily.     albuterol (VENTOLIN HFA) 108 (90 Base) MCG/ACT inhaler Inhale 1-2 puffs into the lungs every 6 (six) hours as needed for wheezing or shortness of breath.     bisoprolol (ZEBETA) 5 MG tablet Take 5 mg by mouth daily.     Calcium Citrate-Vitamin D (CITRACAL + D PO) Take 1 tablet by mouth 2 (two) times daily.     dutasteride (AVODART) 0.5 MG capsule TAKE ONE CAPSULE BY MOUTH EVERY 3 DAYS 90 capsule 2   ELIQUIS 5 MG TABS tablet TAKE ONE (1) TABLET BY MOUTH TWO TIMES PER DAY 180 tablet 1   furosemide (LASIX) 80 MG tablet TAKE 1 TABLET BY MOUTH EVERY OTHER DAY 45 tablet 0    glipiZIDE (GLUCOTROL XL) 5 MG 24 hr tablet Take 5 mg by mouth daily.     ipratropium (ATROVENT) 0.03 % nasal spray Place 2 sprays into both nostrils as needed for rhinitis.     latanoprost (XALATAN) 0.005 % ophthalmic solution Place 1 drop into the right eye at bedtime.      losartan (COZAAR) 50 MG tablet Take 50 mg by mouth daily.     metFORMIN (GLUCOPHAGE-XR) 500 MG 24 hr tablet Take 500 mg by mouth in the morning and at bedtime.      montelukast (SINGULAIR) 10 MG tablet Take 10 mg by mouth daily.      Multiple Vitamin (MULTIVITAMIN) tablet Take 1 tablet by mouth daily.     omeprazole (PRILOSEC) 40 MG capsule Take 40 mg by mouth every morning.     rosuvastatin (CRESTOR) 40 MG tablet Take 1 tablet (40 mg total) by mouth daily. 90 tablet 1   spironolactone (ALDACTONE) 25 MG tablet Take 25 mg by mouth daily.     traZODone (DESYREL) 100 MG tablet Take 100 mg by mouth at bedtime.     No current facility-administered medications for this encounter.    Atrial Fibrillation Management history:  Previous antiarrhythmic drugs: none Previous cardioversions: 10/21/23 Previous ablations: 02/17/24 Anticoagulation history: Eliquis   ROS- All systems are reviewed and negative except as per the HPI above.  Physical Exam: BP 110/72   Pulse 62  Ht 5\' 5"  (1.651 m)   Wt 82.8 kg   BMI 30.39 kg/m   GEN: Well nourished, well developed in no acute distress NECK: No JVD; No carotid bruits CARDIAC: Regular rate and rhythm, no murmurs, rubs, gallops RESPIRATORY:  Clear to auscultation without rales, wheezing or rhonchi  ABDOMEN: Soft, non-tender, non-distended EXTREMITIES:  No edema; No deformity   EKG today demonstrates  Vent. rate 62 BPM PR interval 168 ms QRS duration 150 ms QT/QTcB 472/479 ms P-R-T axes * -81 82 AV dual-paced rhythm Abnormal ECG When compared with ECG of 17-Feb-2024 12:14, PREVIOUS ECG IS PRESENT  Echo 12/27/23 demonstrated  1. Left ventricular ejection fraction, by  estimation, is 40 to 45%. The  left ventricle has mildly decreased function. The left ventricle  demonstrates global hypokinesis. Left ventricular diastolic parameters are  indeterminate. The average left  ventricular global longitudinal strain is -13.8 %.   2. Right ventricular systolic function is mildly reduced. The right  ventricular size is normal. There is mildly elevated pulmonary artery  systolic pressure. The estimated right ventricular systolic pressure is  43.9 mmHg.   3. Left atrial size was mild to moderately dilated.   4. Right atrial size was mildly dilated.   5. The mitral valve is normal in structure. Mild mitral valve  regurgitation. No evidence of mitral stenosis.   6. The aortic valve is normal in structure. Aortic valve regurgitation is  mild. Aortic valve sclerosis/calcification is present, without any  evidence of aortic stenosis.   7. The inferior vena cava is normal in size with greater than 50%  respiratory variability, suggesting right atrial pressure of 3 mmHg.    ASSESSMENT & PLAN CHA2DS2-VASc Score = 8  The patient's score is based upon: CHF History: 1 HTN History: 1 Diabetes History: 1 Stroke History: 2 Vascular Disease History: 1 Age Score: 2 Gender Score: 0       ASSESSMENT AND PLAN: Persistent Atrial Fibrillation (ICD10:  I48.19) The patient's CHA2DS2-VASc score is 8, indicating a 10.8% annual risk of stroke.   S/p Afib ablation on 02/17/24 by Dr. Jimmey Ralph.  He is currently in AV paced rhythm. Continue bisoprolol 5 mg daily.   Secondary Hypercoagulable State (ICD10:  D68.69) The patient is at significant risk for stroke/thromboembolism based upon his CHA2DS2-VASc Score of 8.  Continue Apixaban (Eliquis).  Continue Eliquis 5 mg BID without interruption.    Follow up with EP as scheduled.    Lake Bells, PA-C  Afib Clinic [facility] [address] [place], [place] [address] [phone]

## 2024-04-10 ENCOUNTER — Ambulatory Visit: Attending: Internal Medicine

## 2024-04-10 DIAGNOSIS — Z95810 Presence of automatic (implantable) cardiac defibrillator: Secondary | ICD-10-CM | POA: Diagnosis not present

## 2024-04-10 DIAGNOSIS — I5032 Chronic diastolic (congestive) heart failure: Secondary | ICD-10-CM | POA: Diagnosis not present

## 2024-04-13 NOTE — Progress Notes (Signed)
 EPIC Encounter for ICM Monitoring  Patient Name: [name]. is a 83 y.o. male Date: 04/13/2024 Primary Care Physican: Nestor Banter, MD Primary Cardiologist: Daneil Dunker Electrophysiologist: Gareth Junes Pacing: 99.4%         02/09/2023 Weight: 180 lbs 04/23/2023 Weight: 181 lbs 07/02/2023 Weight: 179-180 lbs 08/06/2023 Weight: 179 lbs 11/30/2023 Weight: 176.3 lbs 01/18/2024 Office Weight: 184 lbs 02/02/2024 Weight: 177 lbs 03/10/2024 Weight: 178-180 lbs  Time in AT/AF  0.0 hr/day (0.0%) (taking Eliquis )                                                          Spoke with patient and heart failure questions reviewed.  Transmission results reviewed.  Pt asymptomatic for fluid accumulation.  Reports feeling well at this time and voices no complaints.     Optivol thoracic impedance suggesting normal fluid levels for the past month.    Prescribed:  Furosemide  80 mg Take 1 tablet (s) (80 mg total) by mouth every OTHER day.   Spironolactone  25 mg take 1 tablet by mouth daily   Labs: 01/17/2024 Creatinine 1.0, BUN 23, Potassium 4.7, Sodium 140, GFR 75 01/19/2023 Creatinine 0.9, BUN 24, Potassium 4.0, Sodium 141, GFR 86 A complete set of results can be found in Results Review.   Recommendations:  No changes and encouraged to call if experiencing any fluid symptoms.   Follow-up plan: ICM clinic phone appointment on 05/16/2024.   91 day device clinic remote transmission 05/04/2024.     EP/Cardiology Office Visits:   05/19/2024 with Michaelle Adolphus, PA.     Copy of ICM check sent to Dr Daneil Dunker (previous pt of Dr. Rodolfo Clan)   3 month ICM trend: 04/10/2024.    12-14 Month ICM trend:     Almyra Jain, RN 04/13/2024 8:23 AM

## 2024-04-16 ENCOUNTER — Telehealth: Payer: Self-pay | Admitting: Home Health

## 2024-04-16 DIAGNOSIS — I4819 Other persistent atrial fibrillation: Secondary | ICD-10-CM

## 2024-04-16 NOTE — Telephone Encounter (Signed)
 Patient called after-hours line reporting he had elevated heart rate today.  He mentioned that he had A-fib ablation end of February.  His heart rate normally is around 60s.  He wears a Fitbit watch which has been registering his heart rate from 85-120BPM throughout the day.  He states he does not feel particularly bad, had some fatigue that he noted during church, denied any significant chest pain, shortness of breath, dizziness, syncope.  He is wondering when he should be concerned about elevated heart rate.  He is continued on his home medication bisoprolol  and Eliquis .  Explained to the patient that it is difficult to tell if he is returned in atrial fibrillation without EKG. Message has been sent to A-fib clinic to arrange a follow-up visit in a few days.  Explained to the patient that if his symptoms start to become significant or escalating or her heart rate becomes sustained elevated >140, he should go to the ER, otherwise may wait for an appointment with A-fib clinic.  Will forward this message to Dr. Daneil Dunker.

## 2024-04-17 MED ORDER — BISOPROLOL FUMARATE 5 MG PO TABS
10.0000 mg | ORAL_TABLET | Freq: Every day | ORAL | Status: DC
Start: 2024-04-17 — End: 2024-05-12

## 2024-04-17 NOTE — Addendum Note (Signed)
 Addended by: Mertha Abrahams T on: 04/17/2024 02:13 PM   Modules accepted: Orders

## 2024-04-17 NOTE — Addendum Note (Signed)
 Addended by: Mertha Abrahams T on: 04/17/2024 02:09 PM   Modules accepted: Orders

## 2024-04-17 NOTE — Telephone Encounter (Signed)
 Remote transmission received. Normal device function. Presenting rhythm ~ AS/BIV-P 115-117 bpm. Trends do appear increased although no other alerts noted.   Routing to Dr. Daneil Dunker to advise further.

## 2024-04-17 NOTE — Telephone Encounter (Signed)
 Patient will check BP and call back in 1 hour with updated BP.

## 2024-04-17 NOTE — Telephone Encounter (Addendum)
 Patient called back. BP: 129/81  Patient informed to increase Bisoprolol  to 10 mg daily. Patient voiced understanding and appreciative of call.   Script updated and sent to pharmacy.

## 2024-04-20 ENCOUNTER — Ambulatory Visit (HOSPITAL_COMMUNITY): Admitting: Internal Medicine

## 2024-05-04 ENCOUNTER — Ambulatory Visit (INDEPENDENT_AMBULATORY_CARE_PROVIDER_SITE_OTHER): Payer: Medicare Other

## 2024-05-04 DIAGNOSIS — I442 Atrioventricular block, complete: Secondary | ICD-10-CM | POA: Diagnosis not present

## 2024-05-04 LAB — CUP PACEART REMOTE DEVICE CHECK
Battery Remaining Longevity: 48 mo
Battery Voltage: 2.97 V
Brady Statistic AP VP Percent: 93.54 %
Brady Statistic AP VS Percent: 0.06 %
Brady Statistic AS VP Percent: 6.37 %
Brady Statistic AS VS Percent: 0.03 %
Brady Statistic RA Percent Paced: 93.09 %
Brady Statistic RV Percent Paced: 99.33 %
Date Time Interrogation Session: 20250515034907
HighPow Impedance: 50 Ohm
HighPow Impedance: 60 Ohm
Implantable Lead Connection Status: 753985
Implantable Lead Connection Status: 753985
Implantable Lead Connection Status: 753985
Implantable Lead Implant Date: 20070615
Implantable Lead Implant Date: 20120813
Implantable Lead Implant Date: 20120813
Implantable Lead Location: 753858
Implantable Lead Location: 753859
Implantable Lead Location: 753860
Implantable Lead Model: 4396
Implantable Lead Model: 5076
Implantable Lead Model: 7121
Implantable Pulse Generator Implant Date: 20230816
Lead Channel Impedance Value: 247 Ohm
Lead Channel Impedance Value: 342 Ohm
Lead Channel Impedance Value: 342 Ohm
Lead Channel Impedance Value: 399 Ohm
Lead Channel Impedance Value: 532 Ohm
Lead Channel Impedance Value: 836 Ohm
Lead Channel Pacing Threshold Amplitude: 1.125 V
Lead Channel Pacing Threshold Amplitude: 1.25 V
Lead Channel Pacing Threshold Pulse Width: 0.4 ms
Lead Channel Pacing Threshold Pulse Width: 1 ms
Lead Channel Sensing Intrinsic Amplitude: 0.375 mV
Lead Channel Sensing Intrinsic Amplitude: 0.375 mV
Lead Channel Setting Pacing Amplitude: 1.75 V
Lead Channel Setting Pacing Amplitude: 2.25 V
Lead Channel Setting Pacing Amplitude: 3.5 V
Lead Channel Setting Pacing Pulse Width: 0.4 ms
Lead Channel Setting Pacing Pulse Width: 1 ms
Lead Channel Setting Sensing Sensitivity: 0.3 mV
Zone Setting Status: 755011

## 2024-05-10 ENCOUNTER — Ambulatory Visit: Payer: Self-pay | Admitting: Cardiology

## 2024-05-12 ENCOUNTER — Telehealth: Payer: Self-pay

## 2024-05-12 MED ORDER — BISOPROLOL FUMARATE 10 MG PO TABS: 10.0000 mg | ORAL_TABLET | Freq: Every day | ORAL | 3 refills | Status: AC

## 2024-05-12 NOTE — Telephone Encounter (Signed)
 Patient called in wanting to know if  you can send in a RX for bisoprolol 

## 2024-05-16 ENCOUNTER — Ambulatory Visit: Attending: Cardiology

## 2024-05-16 DIAGNOSIS — I5032 Chronic diastolic (congestive) heart failure: Secondary | ICD-10-CM | POA: Diagnosis not present

## 2024-05-16 DIAGNOSIS — Z95810 Presence of automatic (implantable) cardiac defibrillator: Secondary | ICD-10-CM

## 2024-05-17 NOTE — Progress Notes (Unsigned)
  Electrophysiology Office Note:   ID:  Ronnie Cossey., DOB [birth year], MRN [id number]  Primary Cardiologist: None Electrophysiologist: Ardeen Kohler, MD  {Click to update primary MD,subspecialty MD or APP then REFRESH:1}    History of Present Illness:   Ronnie Serano. is a 83 y.o. male with h/o syncope, HFmrEF, ICM, CRT-D, CAD s/p PCI, persis afib, HTN, T2DM  seen today for routine electrophysiology follow-up s/p Ablation.  Since last being seen in our clinic the patient reports doing ***.  he denies chest pain, palpitations, dyspnea, PND, orthopnea, nausea, vomiting, dizziness, syncope, edema, weight gain, or early satiety.    Review of systems complete and found to be negative unless listed in HPI.   EP Information / Studies Reviewed:    EKG is ordered today. Personal review as below.       ICD Interrogation-  reviewed in detail today,  See PACEART report.  Arrhythmia/Device History MDT CRT-D, imp 05/2006; dx ICM, HFrEF CRT upgrade 2012 Gen change 07/2022  S/p PVI, CTI, and posteror wall ablation 01/2024   Physical Exam:   VS:  There were no vitals taken for this visit.   Wt Readings from Last 3 Encounters:  03/20/24 182 lb 9.6 oz (82.8 kg)  02/17/24 175 lb (79.4 kg)  01/18/24 184 lb (83.5 kg)     GEN: No acute distress *** NECK: No JVD; No carotid bruits CARDIAC: {EPRHYTHM:28826}, no murmurs, rubs, gallops RESPIRATORY:  Clear to auscultation without rales, wheezing or rhonchi  ABDOMEN: Soft, non-tender, non-distended EXTREMITIES:  {EDEMA LEVEL:28147::"No"} edema; No deformity   ASSESSMENT AND PLAN:    Chronic systolic CHF  s/p [employer] CRT-D  CHB euvolemic today Stable on an appropriate medical regimen Normal ICD function See Pace Art report No changes today  Persistent AF S/p ablation 01/2024 EKG today shows *** Continue eliquis  5 mg BID for CHA2DS2/VASc of at least 8   HFmrEF, previously improved NYHA *** symptoms Continue  GDMT  Disposition:   Follow up with {EPPROVIDERS:28135} {EPFOLLOW UP:28173}   Signed, Ronnie Galla, PA-C

## 2024-05-19 ENCOUNTER — Ambulatory Visit: Payer: Medicare Other | Attending: Student | Admitting: Student

## 2024-05-19 ENCOUNTER — Encounter: Payer: Self-pay | Admitting: Student

## 2024-05-19 VITALS — BP 116/74 | HR 64 | Ht 65.0 in | Wt 183.6 lb

## 2024-05-19 DIAGNOSIS — I5022 Chronic systolic (congestive) heart failure: Secondary | ICD-10-CM | POA: Insufficient documentation

## 2024-05-19 DIAGNOSIS — Z95810 Presence of automatic (implantable) cardiac defibrillator: Secondary | ICD-10-CM | POA: Insufficient documentation

## 2024-05-19 DIAGNOSIS — I4819 Other persistent atrial fibrillation: Secondary | ICD-10-CM | POA: Diagnosis not present

## 2024-05-19 DIAGNOSIS — I442 Atrioventricular block, complete: Secondary | ICD-10-CM | POA: Diagnosis present

## 2024-05-19 LAB — CUP PACEART INCLINIC DEVICE CHECK
Battery Remaining Longevity: 48 mo
Battery Voltage: 2.93 V
Brady Statistic AP VP Percent: 95.8 %
Brady Statistic AP VS Percent: 0.06 %
Brady Statistic AS VP Percent: 4.12 %
Brady Statistic AS VS Percent: 0.02 %
Brady Statistic RA Percent Paced: 95.27 %
Brady Statistic RV Percent Paced: 98.94 %
Date Time Interrogation Session: 20250530094355
HighPow Impedance: 54 Ohm
HighPow Impedance: 70 Ohm
Implantable Lead Connection Status: 753985
Implantable Lead Connection Status: 753985
Implantable Lead Connection Status: 753985
Implantable Lead Implant Date: 20070615
Implantable Lead Implant Date: 20120813
Implantable Lead Implant Date: 20120813
Implantable Lead Location: 753858
Implantable Lead Location: 753859
Implantable Lead Location: 753860
Implantable Lead Model: 4396
Implantable Lead Model: 5076
Implantable Lead Model: 7121
Implantable Pulse Generator Implant Date: 20230816
Lead Channel Impedance Value: 247 Ohm
Lead Channel Impedance Value: 342 Ohm
Lead Channel Impedance Value: 342 Ohm
Lead Channel Impedance Value: 494 Ohm
Lead Channel Impedance Value: 570 Ohm
Lead Channel Impedance Value: 969 Ohm
Lead Channel Pacing Threshold Amplitude: 1.125 V
Lead Channel Pacing Threshold Amplitude: 1.25 V
Lead Channel Pacing Threshold Pulse Width: 0.4 ms
Lead Channel Pacing Threshold Pulse Width: 1 ms
Lead Channel Sensing Intrinsic Amplitude: 0.375 mV
Lead Channel Sensing Intrinsic Amplitude: 0.375 mV
Lead Channel Setting Pacing Amplitude: 1.75 V
Lead Channel Setting Pacing Amplitude: 2.5 V
Lead Channel Setting Pacing Amplitude: 3.5 V
Lead Channel Setting Pacing Pulse Width: 0.4 ms
Lead Channel Setting Pacing Pulse Width: 1 ms
Lead Channel Setting Sensing Sensitivity: 0.3 mV
Zone Setting Status: 755011

## 2024-05-19 NOTE — Patient Instructions (Signed)
 Medication Instructions:  No medication changes today. *If you need a refill on your cardiac medications before your next appointment, please call your pharmacy*  Lab Work: No labwork ordered today. If you have labs (blood work) drawn today and your tests are completely normal, you will receive your results only by: MyChart Message (if you have MyChart) OR A paper copy in the mail If you have any lab test that is abnormal or we need to change your treatment, we will call you to review the results.  Testing/Procedures: No testing ordered today  Follow-Up: At [facility], you and your health needs are our priority.  As part of our continuing mission to provide you with exceptional heart care, our providers are all part of one team.  This team includes your primary Cardiologist (physician) and Advanced Practice Providers or APPs (Physician Assistants and Nurse Practitioners) who all work together to provide you with the care you need, when you need it.  Your next appointment:   6 month(s)  Provider:   You may see Ardeen Kohler, MD or one of the following Advanced Practice Providers on your designated Care Team:   Suzann Riddle, NP    We recommend signing up for the patient portal called "MyChart".  Sign up information is provided on this After Visit Summary.  MyChart is used to connect with patients for Virtual Visits (Telemedicine).  Patients are able to view lab/test results, encounter notes, upcoming appointments, etc.  Non-urgent messages can be sent to your provider as well.   To learn more about what you can do with MyChart, go to [email or web address].

## 2024-05-19 NOTE — Progress Notes (Signed)
 EPIC Encounter for ICM Monitoring  Patient Name: [name]. is a 83 y.o. male Date: 05/19/2024 Primary Care Physican: Nestor Banter, MD Primary Cardiologist: Daneil Dunker Electrophysiologist: Gareth Junes Pacing: 99.3%         02/09/2023 Weight: 180 lbs 04/23/2023 Weight: 181 lbs 07/02/2023 Weight: 179-180 lbs 08/06/2023 Weight: 179 lbs 11/30/2023 Weight: 176.3 lbs 01/18/2024 Office Weight: 184 lbs 02/02/2024 Weight: 177 lbs 03/10/2024 Weight: 178-180 lbs   Time in AT/AF  0.0 hr/day (0.0%) (taking Eliquis )                                                          Transmission results reviewed.      Optivol thoracic impedance suggesting normal fluid levels for the past month.    Prescribed:  Furosemide  80 mg Take 1 tablet (s) (80 mg total) by mouth every OTHER day.   Spironolactone  25 mg take 1 tablet by mouth daily   Labs: 01/17/2024 Creatinine 1.0, BUN 23, Potassium 4.7, Sodium 140, GFR 75 01/19/2023 Creatinine 0.9, BUN 24, Potassium 4.0, Sodium 141, GFR 86 A complete set of results can be found in Results Review.   Recommendations:  Any recommendations will be given at 5/30 OV with Michaelle Adolphus, PA.   Follow-up plan: ICM clinic phone appointment on 07/03/2024.   91 day device clinic remote transmission 08/03/2024.     EP/Cardiology Office Visits:   05/19/2024 with Michaelle Adolphus, PA.     Copy of ICM check sent to Dr Daneil Dunker.  3 month ICM trend: 05/16/2024.    12-14 Month ICM trend:     Almyra Jain, RN 05/19/2024 8:44 AM

## 2024-05-30 ENCOUNTER — Other Ambulatory Visit: Payer: Self-pay | Admitting: Cardiology

## 2024-05-30 ENCOUNTER — Other Ambulatory Visit: Payer: Self-pay | Admitting: Internal Medicine

## 2024-05-30 DIAGNOSIS — I480 Paroxysmal atrial fibrillation: Secondary | ICD-10-CM

## 2024-05-30 NOTE — Telephone Encounter (Signed)
 Prescription refill request for Eliquis  received. Indication: Afib  Last office visit: 04/22/24 Percell Boyers)  Scr: 1.0 (01/17/24)  Age: 83 Weight: 83.3kg  Appropriate dose. Refill sent.

## 2024-06-11 ENCOUNTER — Ambulatory Visit: Payer: Self-pay | Admitting: Cardiology

## 2024-06-13 NOTE — Progress Notes (Signed)
 Remote ICD transmission.

## 2024-07-03 ENCOUNTER — Ambulatory Visit: Attending: Cardiology

## 2024-07-03 DIAGNOSIS — I5032 Chronic diastolic (congestive) heart failure: Secondary | ICD-10-CM | POA: Diagnosis not present

## 2024-07-03 DIAGNOSIS — Z95810 Presence of automatic (implantable) cardiac defibrillator: Secondary | ICD-10-CM

## 2024-07-04 NOTE — Progress Notes (Signed)
 EPIC Encounter for ICM Monitoring  Patient Name: [name]. is a 83 y.o. male Date: 07/04/2024 Primary Care Physican: Diedra Lame, MD Primary Cardiologist: Kennyth Electrophysiologist: Kennyth Pore Pacing: 97.8%         01/18/2024 Office Weight: 184 lbs 02/02/2024 Weight: 177 lbs 03/10/2024 Weight: 178-180 lbs 05/19/2024 Weight: 183 lbs 07/05/2027 Weight: 178.8 lbs (lost 2 lbs today after taking Furosemide )   Time in AT/AF  0.0 hr/day (0.0%) (taking Eliquis )                                                          Spoke with patient and heart failure questions reviewed.  Transmission results reviewed.  Pt 's weight dropped 2 lbs today after taking Furosemide  today (takes every other day)   Optivol thoracic impedance suggesting possible fluid accumulation starting 7/9 (today was Lasix  day).  Pt has voided a lot today and feels like any fluid has resolved.    Prescribed:  Furosemide  80 mg Take 1 tablet (s) (80 mg total) by mouth every OTHER day.   Spironolactone  25 mg take 1 tablet by mouth daily   Labs: 01/17/2024 Creatinine 1.0, BUN 23, Potassium 4.7, Sodium 140, GFR 75 01/19/2023 Creatinine 0.9, BUN 24, Potassium 4.0, Sodium 141, GFR 86 A complete set of results can be found in Results Review.   Recommendations:  No changes and encouraged to call if experiencing any fluid symptoms.   Follow-up plan: ICM clinic phone appointment on 08/07/2024.   91 day device clinic remote transmission 08/03/2024.     EP/Cardiology Office Visits:  Recall 11/15/2024 with Jodie Passey, PA.     Copy of ICM check sent to Dr Kennyth.  3 month ICM trend: 07/03/2024.    12-14 Month ICM trend:     Mitzie GORMAN Garner, RN 07/04/2024 12:05 PM

## 2024-07-06 ENCOUNTER — Other Ambulatory Visit: Payer: Self-pay | Admitting: Internal Medicine

## 2024-07-10 ENCOUNTER — Encounter

## 2024-08-02 ENCOUNTER — Other Ambulatory Visit: Payer: Self-pay | Admitting: Internal Medicine

## 2024-08-03 ENCOUNTER — Ambulatory Visit: Payer: Medicare Other

## 2024-08-03 DIAGNOSIS — I442 Atrioventricular block, complete: Secondary | ICD-10-CM

## 2024-08-03 LAB — CUP PACEART REMOTE DEVICE CHECK
Battery Remaining Longevity: 44 mo
Battery Voltage: 2.97 V
Brady Statistic AP VP Percent: 99.57 %
Brady Statistic AP VS Percent: 0.42 %
Brady Statistic AS VP Percent: 0 %
Brady Statistic AS VS Percent: 0 %
Brady Statistic RA Percent Paced: 100 %
Brady Statistic RV Percent Paced: 93.84 %
Date Time Interrogation Session: 20250814043725
HighPow Impedance: 50 Ohm
HighPow Impedance: 60 Ohm
Implantable Lead Connection Status: 753985
Implantable Lead Connection Status: 753985
Implantable Lead Connection Status: 753985
Implantable Lead Implant Date: 20070615
Implantable Lead Implant Date: 20120813
Implantable Lead Implant Date: 20120813
Implantable Lead Location: 753858
Implantable Lead Location: 753859
Implantable Lead Location: 753860
Implantable Lead Model: 4396
Implantable Lead Model: 5076
Implantable Lead Model: 7121
Implantable Pulse Generator Implant Date: 20230816
Lead Channel Impedance Value: 247 Ohm
Lead Channel Impedance Value: 323 Ohm
Lead Channel Impedance Value: 342 Ohm
Lead Channel Impedance Value: 437 Ohm
Lead Channel Impedance Value: 532 Ohm
Lead Channel Impedance Value: 893 Ohm
Lead Channel Pacing Threshold Amplitude: 1.25 V
Lead Channel Pacing Threshold Amplitude: 1.25 V
Lead Channel Pacing Threshold Pulse Width: 0.4 ms
Lead Channel Pacing Threshold Pulse Width: 1 ms
Lead Channel Sensing Intrinsic Amplitude: 0.25 mV
Lead Channel Sensing Intrinsic Amplitude: 0.25 mV
Lead Channel Sensing Intrinsic Amplitude: 6.125 mV
Lead Channel Sensing Intrinsic Amplitude: 6.125 mV
Lead Channel Setting Pacing Amplitude: 1.75 V
Lead Channel Setting Pacing Amplitude: 2.5 V
Lead Channel Setting Pacing Amplitude: 3.5 V
Lead Channel Setting Pacing Pulse Width: 0.4 ms
Lead Channel Setting Pacing Pulse Width: 1 ms
Lead Channel Setting Sensing Sensitivity: 0.3 mV
Zone Setting Status: 755011

## 2024-08-06 ENCOUNTER — Ambulatory Visit: Payer: Self-pay | Admitting: Cardiology

## 2024-08-07 ENCOUNTER — Ambulatory Visit: Attending: Cardiology

## 2024-08-07 DIAGNOSIS — Z95810 Presence of automatic (implantable) cardiac defibrillator: Secondary | ICD-10-CM

## 2024-08-07 DIAGNOSIS — I5032 Chronic diastolic (congestive) heart failure: Secondary | ICD-10-CM

## 2024-08-14 ENCOUNTER — Telehealth: Payer: Self-pay

## 2024-08-14 NOTE — Progress Notes (Signed)
 EPIC Encounter for ICM Monitoring  Patient Name: [name]. is a 83 y.o. male Date: 08/14/2024 Primary Care Physican: Diedra Lame, MD Primary Cardiologist: Kennyth Electrophysiologist: Kennyth Pore Pacing: 92.7%         01/18/2024 Office Weight: 184 lbs 02/02/2024 Weight: 177 lbs 03/10/2024 Weight: 178-180 lbs 05/19/2024 Weight: 183 lbs 07/05/2027 Weight: 178.8 lbs (lost 2 lbs today after taking Furosemide )   Time in AT/AF  0.0 hr/day (0.0%) (taking Eliquis )                                                          Attempted call to patient and unable to reach.  Left detailed message per DPR regarding transmission.  Transmission results reviewed.    Optivol thoracic impedance suggesting normal fluid levels within the last month.    Prescribed:  Furosemide  80 mg Take 1 tablet (s) (80 mg total) by mouth every OTHER day.   Spironolactone  25 mg take 1 tablet by mouth daily   Labs: 01/17/2024 Creatinine 1.0, BUN 23, Potassium 4.7, Sodium 140, GFR 75 01/19/2023 Creatinine 0.9, BUN 24, Potassium 4.0, Sodium 141, GFR 86 A complete set of results can be found in Results Review.   Recommendations:  Left voice mail with ICM number and encouraged to call if experiencing any fluid symptoms.   Follow-up plan: ICM clinic phone appointment on 09/07/2024.   91 day device clinic remote transmission 11/02/2024.     EP/Cardiology Office Visits:  Recall 11/15/2024 with Jodie Passey, PA.     Copy of ICM check sent to Dr Kennyth.  3 month ICM trend: 08/07/2024.    12-14 Month ICM trend:     Mitzie GORMAN Garner, RN 08/14/2024 10:15 AM

## 2024-08-14 NOTE — Telephone Encounter (Signed)
 Remote ICM transmission received.  Attempted call to patient regarding ICM remote transmission and left detailed message per DPR.  Left ICM phone number and advised to return call for any fluid symptoms or questions. Next ICM remote transmission scheduled 09/07/2024.

## 2024-08-15 ENCOUNTER — Encounter: Payer: Self-pay | Admitting: Urology

## 2024-08-28 ENCOUNTER — Other Ambulatory Visit: Payer: Self-pay

## 2024-08-28 DIAGNOSIS — N400 Enlarged prostate without lower urinary tract symptoms: Secondary | ICD-10-CM

## 2024-08-28 MED ORDER — DUTASTERIDE 0.5 MG PO CAPS
ORAL_CAPSULE | ORAL | 2 refills | Status: DC
Start: 2024-08-28 — End: 2024-08-30

## 2024-08-30 ENCOUNTER — Other Ambulatory Visit: Payer: Self-pay

## 2024-08-30 DIAGNOSIS — N400 Enlarged prostate without lower urinary tract symptoms: Secondary | ICD-10-CM

## 2024-08-30 MED ORDER — DUTASTERIDE 0.5 MG PO CAPS: ORAL_CAPSULE | ORAL | 2 refills | Status: DC

## 2024-08-31 ENCOUNTER — Other Ambulatory Visit: Payer: Self-pay

## 2024-08-31 DIAGNOSIS — N400 Enlarged prostate without lower urinary tract symptoms: Secondary | ICD-10-CM

## 2024-08-31 MED ORDER — DUTASTERIDE 0.5 MG PO CAPS: ORAL_CAPSULE | ORAL | 3 refills | Status: AC

## 2024-08-31 MED ORDER — DUTASTERIDE 0.5 MG PO CAPS: ORAL_CAPSULE | ORAL | 3 refills | Status: DC

## 2024-09-08 ENCOUNTER — Ambulatory Visit: Attending: Cardiology

## 2024-09-08 ENCOUNTER — Telehealth: Payer: Self-pay

## 2024-09-08 DIAGNOSIS — Z95810 Presence of automatic (implantable) cardiac defibrillator: Secondary | ICD-10-CM

## 2024-09-08 DIAGNOSIS — I5032 Chronic diastolic (congestive) heart failure: Secondary | ICD-10-CM

## 2024-09-08 NOTE — Progress Notes (Signed)
 EPIC Encounter for ICM Monitoring  Patient Name: [name]. is a 83 y.o. male Date: 09/08/2024 Primary Care Physican: Diedra Lame, MD Primary Cardiologist: Kennyth Electrophysiologist: Kennyth Pore Pacing: 93.9%         01/18/2024 Office Weight: 184 lbs 02/02/2024 Weight: 177 lbs 03/10/2024 Weight: 178-180 lbs 05/19/2024 Weight: 183 lbs 07/05/2027 Weight: 178.8 lbs (lost 2 lbs today after taking Furosemide )   Time in AT/AF  0.0 hr/day (0.0%) (taking Eliquis )                                                          Attempted call to patient and unable to reach.  Left detailed message per DPR regarding transmission.  Transmission results reviewed.    Optivol thoracic impedance suggesting normal fluid levels within the last month.    Prescribed:  Furosemide  80 mg Take 1 tablet (s) (80 mg total) by mouth every OTHER day.   Spironolactone  25 mg take 1 tablet by mouth daily   Labs: 01/17/2024 Creatinine 1.0, BUN 23, Potassium 4.7, Sodium 140, GFR 75 01/19/2023 Creatinine 0.9, BUN 24, Potassium 4.0, Sodium 141, GFR 86 A complete set of results can be found in Results Review.   Recommendations:  Left voice mail with ICM number and encouraged to call if experiencing any fluid symptoms.   Follow-up plan: ICM clinic phone appointment on 10/16/2024.   91 day device clinic remote transmission 11/02/2024.     EP/Cardiology Office Visits:  Recall 11/15/2024 with Dr Kennyth.     Copy of ICM check sent to Dr Kennyth.  3 month ICM trend: 09/08/2024.    12-14 Month ICM trend:     Ronnie GORMAN Garner, RN 09/08/2024 2:52 PM

## 2024-09-08 NOTE — Telephone Encounter (Signed)
 Remote ICM transmission received.  Attempted call to patient regarding ICM remote transmission.  Left detailed message per DPR with ICM phone number to return call for any questions, concerns or fluid symptoms.

## 2024-09-14 NOTE — Progress Notes (Signed)
Remote ICD Transmission.

## 2024-10-16 ENCOUNTER — Ambulatory Visit: Attending: Cardiology

## 2024-10-16 DIAGNOSIS — Z95810 Presence of automatic (implantable) cardiac defibrillator: Secondary | ICD-10-CM | POA: Diagnosis not present

## 2024-10-16 DIAGNOSIS — I5032 Chronic diastolic (congestive) heart failure: Secondary | ICD-10-CM

## 2024-10-19 ENCOUNTER — Telehealth: Payer: Self-pay

## 2024-10-19 NOTE — Telephone Encounter (Signed)
 Remote ICM transmission received.  Attempted call to patient regarding ICM remote transmission.  Left detailed message per DPR with ICM phone number to return call for any questions, concerns or fluid symptoms.

## 2024-10-19 NOTE — Progress Notes (Signed)
 EPIC Encounter for ICM Monitoring  Patient Name: [name]. is a 83 y.o. male Date: 10/19/2024 Primary Care Physican: Diedra Lame, MD Primary Cardiologist: Kennyth Electrophysiologist: Kennyth Pore Pacing: 91.4%         01/18/2024 Office Weight: 184 lbs 02/02/2024 Weight: 177 lbs 03/10/2024 Weight: 178-180 lbs 05/19/2024 Weight: 183 lbs 07/05/2027 Weight: 178.8 lbs (lost 2 lbs today after taking Furosemide )   Since 08-Sep-2024 AT/AF  7 Time in AT/AF  <0.1 hr/day (<0.1%) Longest AT/AF  18 minutes                                                          Attempted call to patient and unable to reach.  Left detailed message per DPR regarding transmission.  Transmission results reviewed.    Since 09/08/2024 ICM Remote Transmission: Optivol thoracic impedance suggesting normal fluid levels.    Prescribed:  Furosemide  80 mg Take 1 tablet (s) (80 mg total) by mouth every OTHER day.   Spironolactone  25 mg take 1 tablet by mouth daily   Labs: 07/10/2024 Creatinine 1.0, BUN 27, Potassium 4.8, Sodium 138, GFR 75 01/17/2024 Creatinine 1.0, BUN 23, Potassium 4.7, Sodium 140, GFR 75 A complete set of results can be found in Results Review.   Recommendations:  No changes and encouraged to call if experiencing any fluid symptoms.   Follow-up plan: ICM clinic phone appointment on 11/20/2024.   91 day device clinic remote transmission 11/02/2024.     EP/Cardiology Office Visits:  Recall 11/15/2024 with Dr Kennyth.     Copy of ICM check sent to Dr Kennyth.   Remote monitoring is medically necessary for Heart Failure Management.    Daily Thoracic Impedance ICM trend: 07/17/2024 through 10/16/2024.    12-14 Month Thoracic Impedance ICM trend:     Mitzie GORMAN Garner, RN 10/19/2024 8:12 AM

## 2024-11-02 ENCOUNTER — Ambulatory Visit: Payer: Medicare Other

## 2024-11-02 DIAGNOSIS — I5032 Chronic diastolic (congestive) heart failure: Secondary | ICD-10-CM | POA: Diagnosis not present

## 2024-11-03 ENCOUNTER — Other Ambulatory Visit: Payer: Self-pay

## 2024-11-03 LAB — CUP PACEART REMOTE DEVICE CHECK
Battery Remaining Longevity: 37 mo
Battery Voltage: 2.96 V
Brady Statistic AP VP Percent: 97.24 %
Brady Statistic AP VS Percent: 0.33 %
Brady Statistic AS VP Percent: 2.33 %
Brady Statistic AS VS Percent: 0.1 %
Brady Statistic RA Percent Paced: 97.36 %
Brady Statistic RV Percent Paced: 91.94 %
Date Time Interrogation Session: 20251113022603
HighPow Impedance: 55 Ohm
HighPow Impedance: 70 Ohm
Implantable Lead Connection Status: 753985
Implantable Lead Connection Status: 753985
Implantable Lead Connection Status: 753985
Implantable Lead Implant Date: 20070615
Implantable Lead Implant Date: 20120813
Implantable Lead Implant Date: 20120813
Implantable Lead Location: 753858
Implantable Lead Location: 753859
Implantable Lead Location: 753860
Implantable Lead Model: 4396
Implantable Lead Model: 5076
Implantable Lead Model: 7121
Implantable Pulse Generator Implant Date: 20230816
Lead Channel Impedance Value: 247 Ohm
Lead Channel Impedance Value: 323 Ohm
Lead Channel Impedance Value: 342 Ohm
Lead Channel Impedance Value: 494 Ohm
Lead Channel Impedance Value: 627 Ohm
Lead Channel Impedance Value: 988 Ohm
Lead Channel Pacing Threshold Amplitude: 1.375 V
Lead Channel Pacing Threshold Amplitude: 1.375 V
Lead Channel Pacing Threshold Pulse Width: 0.4 ms
Lead Channel Pacing Threshold Pulse Width: 1 ms
Lead Channel Sensing Intrinsic Amplitude: 0.375 mV
Lead Channel Sensing Intrinsic Amplitude: 0.375 mV
Lead Channel Sensing Intrinsic Amplitude: 6.125 mV
Lead Channel Sensing Intrinsic Amplitude: 6.125 mV
Lead Channel Setting Pacing Amplitude: 1.75 V
Lead Channel Setting Pacing Amplitude: 2.75 V
Lead Channel Setting Pacing Amplitude: 3.5 V
Lead Channel Setting Pacing Pulse Width: 0.4 ms
Lead Channel Setting Pacing Pulse Width: 1 ms
Lead Channel Setting Sensing Sensitivity: 0.3 mV
Zone Setting Status: 755011

## 2024-11-04 ENCOUNTER — Ambulatory Visit: Payer: Self-pay | Admitting: Cardiology

## 2024-11-07 MED ORDER — ROSUVASTATIN CALCIUM 40 MG PO TABS: 40.0000 mg | ORAL_TABLET | Freq: Every day | ORAL | 1 refills | Status: AC

## 2024-11-07 NOTE — Progress Notes (Signed)
 Remote ICD Transmission

## 2024-11-20 ENCOUNTER — Ambulatory Visit: Attending: Cardiology

## 2024-11-20 DIAGNOSIS — Z95810 Presence of automatic (implantable) cardiac defibrillator: Secondary | ICD-10-CM

## 2024-11-20 DIAGNOSIS — I5032 Chronic diastolic (congestive) heart failure: Secondary | ICD-10-CM

## 2024-11-20 NOTE — Progress Notes (Signed)
 EPIC Encounter for ICM Monitoring  Patient Name: [name]. is a 83 y.o. male Date: 11/20/2024 Primary Care Physican: Diedra Lame, MD Primary Cardiologist: Kennyth Electrophysiologist: Kennyth Pore Pacing: 92.5%         01/18/2024 Office Weight: 184 lbs 02/02/2024 Weight: 177 lbs 03/10/2024 Weight: 178-180 lbs 05/19/2024 Weight: 183 lbs 07/05/2027 Weight: 178.8 lbs (lost 2 lbs today after taking Furosemide ) 11/20/2024 Weight: 182 lbs   Since 02-Nov-2024 Time in AT/AF 0.0 hr/day (0.0%)                                                          Spoke with patient and heart failure questions reviewed.  Transmission results reviewed.  Pt reports weight gain of a couple of pounds.  Reports feeling well at this time and voices no complaints.  He was out of town over Thanksgiving holiday and ate foods higher in salt.  Will get back injection next week.    Since 10/16/2024 ICM Remote Transmission: Optivol thoracic impedance suggesting normal fluid levels with the exception of possible fluid accumulation starting 11/17/2024.    Prescribed:  Furosemide  80 mg Take 1 tablet (s) (80 mg total) by mouth every OTHER day.   Spironolactone  25 mg take 1 tablet by mouth daily   Labs: 07/10/2024 Creatinine 1.0, BUN 27, Potassium 4.8, Sodium 138, GFR 75 01/17/2024 Creatinine 1.0, BUN 23, Potassium 4.7, Sodium 140, GFR 75 A complete set of results can be found in Results Review.   Recommendations:   Advised to limit salt and call if weight does not return to baseline.   Follow-up plan: ICM clinic phone appointment on 01/01/2025.   91 day device clinic remote transmission 02/01/2025.     EP/Cardiology Office Visits:  Recall 11/15/2024 with Dr Kennyth.     Copy of ICM check sent to Dr Kennyth.     Remote monitoring is medically necessary for Heart Failure Management.    Daily Thoracic Impedance ICM trend: 08/21/2024 through 11/20/2024.    12-14 Month Thoracic Impedance ICM trend:     Mitzie GORMAN Garner, RN 11/20/2024 9:49 AM

## 2024-11-28 ENCOUNTER — Other Ambulatory Visit: Payer: Self-pay | Admitting: Cardiology

## 2024-11-28 DIAGNOSIS — I480 Paroxysmal atrial fibrillation: Secondary | ICD-10-CM

## 2024-11-28 NOTE — Telephone Encounter (Signed)
 Prescription refill request for Eliquis  received. Indication:afib Last office visit:5/25 Scr: 1.0  7/25 Age:83 Weight:83.3  kg  Prescription refilled

## 2025-01-01 ENCOUNTER — Ambulatory Visit: Attending: Cardiology

## 2025-01-01 ENCOUNTER — Other Ambulatory Visit: Payer: Self-pay

## 2025-01-01 DIAGNOSIS — I5032 Chronic diastolic (congestive) heart failure: Secondary | ICD-10-CM

## 2025-01-01 DIAGNOSIS — Z95810 Presence of automatic (implantable) cardiac defibrillator: Secondary | ICD-10-CM | POA: Diagnosis not present

## 2025-01-01 DIAGNOSIS — R972 Elevated prostate specific antigen [PSA]: Secondary | ICD-10-CM

## 2025-01-01 NOTE — Progress Notes (Signed)
 HPI The patient is a 84 year old gentleman who presents today for follow up of low back pain with radiation into the right buttock, groin, anterior lateral thigh to the knee.  He describes the sensation as a dull ache.  His symptoms began in April, 2014 when he was digging in his garden.  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  His pain is worsened with standing and walking and this is limited to less than 2 min.  His pain is rated a 9/10.  He does have relief with sitting.  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  Prednisone  10 mg 6 day taper provided .  Mobic  has been beneficial.  Flexeril and gabapentin were discontinued.  Physical therapy has also provided some benefit.  Hydrocodone  has been of minimal benefit.  It was well tolerated.  He has been using an over-the-counter brace.  He was evaluated by Dr. Mavis (2017) and given 3 choices.  He explains that they include 1) continue current management, 2) consider 2-3 minor surgeries, 3) a multilevel fusion that would likely require over 8 hours of surgery (and may be difficult given his cardiovascular status).  The patient is trying to decide if he would like to proceed with a CT myelogram and consider 2-3 minor surgeries.  He was last evaluated on 09/25/2024 at which time he continued Tylenol  500 mg 1 to 2 tablets 3 times daily as needed, NSAIDs were discontinued by cardiology, and he had over 50% improvement following his last right L3-4 and left L4-5 transforaminal ESI.  He presents today in follow-up of his most recent left L3-4 and left L4-5 transforaminal ESI on 08/28/2024.  Today he reports over 70% relief of his left-sided pain.  Unfortunately, he has had some return of his right-sided pain.  His symptoms are worsened with standing activities.  He would like to try an over-the-counter TENS unit.  He would prefer to do a right L3-4 and  left L4-5 transforaminal ESI for his next procedure.  We did pull up his CT myelogram and discussed possible future options if the injections become less effective over time.  He is hopeful to avoid surgery.  He is accompanied by his wife today.  They are celebrating their 52nd wedding anniversary today.   He has previously used an elliptical at the [facility].  We discussed the risks and benefits of staying on Eliquis  or holding this medication prior to future epidurals (25 gauge needle).  He would like to stay on Eliquis  for future epidurals.  The [place]  Narcotic Database was reviewed today.   Procedures: 12/04/2024: Left L3-4 and left L4-5 transforaminal ESI (50% relief) 08/28/2024: Right L3-4 and left L4-5 transforaminal ESI (50% relief) 04/27/2024: Right L3-4 and left L4-5 transforaminal ESI (75% relief) 06/10/2023: Right L3-4 and left L4-5 transforaminal ESI (50% relief) 03/08/2023: Right L3-4 and left L4-5 transforaminal ESI (moderate to good relief for 3 months) 12/04/2022: Left L4-5 transforaminal ESI (moderate to good relief) 08/19/2022: Right L3-4 and left L4-5 transforaminal ESI (good relief of right sided pain, moderate to good relief of left leg pain) 04/08/2022: Left L4-5 transforaminal ESI (good relief)  12/23/2021: Right L3-4 and left L4-5 transforaminal ESI (good relief) 08/05/2021: Left L4-5 transforaminal ESI (good relief for 4 weeks) 01/21/2021: Right L3-4 and left L4-5 transforaminal ESI 06/07/2020: Right L3-4 and left L4-5 transforaminal ESI (good relief) 05/02/2019: Right L3-4 and left L4-5 transforaminal ESI (good  relief) 08/12/2018: Right L3-4 and left L4-5 transforaminal ESI (good relief) 12/01/2017: Right L3-4 and left L4-5 transforaminal ESI (good relief) 06/17/2017: Right L3-4 and left L4-5 transforaminal ESI (good relief) 02/24/2017: Right L3-4 and left L4-5 transforaminal ESI 05/12/2016:  Right L3-4 and left L4-5 transforaminal ESI (good relief, stayed  on Eliquis ) 01/22/2016:  Right L3-4 and left L4-5 transforaminal ESI (good relief 2 weeks, stayed on Eliquis ) 10/10/2015:  Right L3-4 and left L4-5 transforaminal ESI (good relief, stayed on Eliquis ) 04/30/2015:  Left L4-5 transforaminal ESI (good relief, stayed on Eliquis  02/06/2015:  Right L3-4 transforaminal ESI (greater than 50% relief, Eliquis  was held) 07/12/2014:  Right L3-4 transforaminal ESI (good relief)   Past Medical History:  Diagnosis Date   Allergic state    Asthma without status asthmaticus (HHS-HCC)    BPH (benign prostatic hyperplasia)    CAD (coronary artery disease)    Diabetes mellitus type 2, uncomplicated (CMS/HHS-HCC)    Erectile dysfunction    GERD (gastroesophageal reflux disease)    Hyperlipidemia    Hypertension     Past Surgical History:  Procedure Laterality Date   JOINT REPLACEMENT Left 11/2004   knee-- Revision 06/20/15   COLONOSCOPY  08/11/2007   Diverticulosis: CBF 07/2017; Recall Ltr mailed 06/15/2017 (dw)   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   s/p L TKA Revision- Dr.Menz   COLONOSCOPY  04/01/2018   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   EGD  04/01/2018   Gastritis: No repeat per RTE   CTR 05/28/21 Right 05/28/2021    s/p IAD     by Cardiology   Angioplasty and stenting X 2 separate ocassions     INSERT / REPLACE / REMOVE PACEMAKER      Social History   Socioeconomic History   Marital status: Married    Spouse name: Clarita   Number of children: 4   Years of education: 4+  Tobacco Use   Smoking status: Never   Smokeless tobacco: Never  Vaping Use   Vaping status: Never Used  Substance and Sexual Activity   Alcohol use: Yes    Alcohol/week: 0.0 standard drinks of alcohol   Drug use: No   Sexual activity: Defer   Social Drivers of Health   Financial Resource Strain: Patient Declined (07/27/2024)   Overall Financial Resource Strain (CARDIA)    Difficulty of Paying Living Expenses: Patient  declined  Food Insecurity: Patient Declined (07/27/2024)   Hunger Vital Sign    Worried About Running Out of Food in the Last Year: Patient declined    Ran Out of Food in the Last Year: Patient declined  Transportation Needs: Patient Declined (07/27/2024)   PRAPARE - Administrator, Civil Service (Medical): Patient declined    Lack of Transportation (Non-Medical): Patient declined    Current Outpatient Medications on File Prior to Visit  Medication Sig Dispense Refill   acetaminophen  (TYLENOL ) 500 MG tablet Take 500 mg by mouth every 8 (eight) hours as needed for Pain     albuterol  (PROAIR  HFA) 90 mcg/actuation inhaler Inhale 2 inhalations into the lungs every 6 (six) hours as needed for Wheezing 8.5 g 1   bisoprolol  (ZEBETA ) 10 MG tablet Take 10 mg by mouth once daily     bisoprolol  (ZEBETA ) 5 MG tablet Take 10 mg by mouth once daily     calcium  carbonate-vitamin D3 (OS-CAL 500+D) 500 mg(1,250mg ) -200 unit tablet Take 1 tablet by mouth 2 (two) times daily with meals  dutasteride  (AVODART ) 0.5 mg capsule TAKE 1 CAPSULE 3 TIMES PER WEEK 90 capsule 1   ELIQUIS  5 mg tablet 2 (two) times daily.       FUROsemide  (LASIX ) 40 MG tablet Take 80 mg by mouth every other day     FUROsemide  (LASIX ) 80 MG tablet Take 80 mg by mouth every other day     glipiZIDE (GLUCOTROL XL) 5 MG XL tablet TAKE 1 TABLET BY MOUTH DAILY 90 tablet 3   ipratropium (ATROVENT) 21 mcg (0.03 %) nasal spray      latanoprost  (XALATAN ) 0.005 % ophthalmic solution Place 1 drop into the right eye nightly.      losartan (COZAAR) 50 MG tablet TAKE 1 TABLET BY MOUTH DAILY 90 tablet 3   metFORMIN (GLUCOPHAGE-XR) 500 MG XR tablet TAKE 2 TABLETS BY MOUTH DAILY 180 tablet 1   montelukast (SINGULAIR) 10 mg tablet TAKE 1 TABLET BY MOUTH EVERY EVENING 90 tablet 3   multivitamin tablet Take 1 tablet by mouth once daily.     omeprazole (PRILOSEC) 40 MG DR capsule TAKE 1 CAPSULE BY MOUTH EACH MORNING 30  MINUTES BEFORE FIRST MEAL 90 capsule 1   rosuvastatin  (CRESTOR ) 40 MG tablet 40 mg once daily     spironolactone  (ALDACTONE ) 25 MG tablet Take 1 tablet by mouth once daily     traZODone  (DESYREL ) 100 MG tablet TAKE ONE TABLET BY MOUTH AT BEDTIME FOR 180 DAYS 90 tablet 1   No current facility-administered medications on file prior to visit.    Allergies as of 01/01/2025 - Reviewed 01/01/2025  Allergen Reaction Noted   Beta-blockers (beta-adrenergic blocking agts) Other (See Comments) 04/25/2014   Dorzolamide Swelling 09/08/2023   Oxycodone Vomiting 05/04/2014   Sulfa  (sulfonamide antibiotics) Vomiting 05/04/2014    ROS More than 10 system, review of system form was given to the patient to fill out and has been signed by Dr. Avanell and scanned into the patient's chart.   Vitals Vitals:   01/01/25 1053  BP: 112/70  Pulse: 69  Temp: 36.3 C (97.4 F)  TempSrc: Oral  Weight: 82.1 kg (181 lb)  Height: 167.6 cm (5' 5.98)  PainSc:   2  PainLoc: Back    Physical exam General: Alert oriented well-nourished no distress  Lumbar exam (07/05/2023) Upon inspection there are no rashes or scars.  Little tenderness upon palpation of the lumbosacral paraspinal musculature.  Lower extremity exam (performed 07/05/2023) He has 5/5 strength of bilateral dorsiflexors, knee extensors, and hip flexors.  Sensation light touch is intact throughout bilateral lower extremities.  He has symmetrical 2+ patellar and Achilles reflexes.  Straight leg raise is negative bilaterally.  Range of motion of the hip joints does not produce pain.  Radiological Data   Impression 1.  Low back pain with radiation into the right buttock, groin, anterior lateral thigh to the knee.  Clinically his symptoms consistent with degenerative disc disease verses a disc herniation and associated lumbosacral radiculitis (L3).  CT from [facility] dated 03/24/16 demonstrates at L5-S1, there is mild disc desiccation and broad-based  disc bulging asymmetric to the right.  There are mild to moderate bilateral facet degenerative changes.  There is mild to moderate bilateral foraminal stenosis.  There is no central stenosis.  At L4-5 there is severe degenerative disc disease with severe facet arthropathy and a grade 1 anterior listhesis.  There appears to be a right foraminal disc herniation with severe right foraminal stenosis.  There is mild to moderate left foraminal stenosis.  There is mild to moderate central stenosis.  At L3-4 there is moderate degenerative disc disease and broad-based disc bulging.  There is a questionable right foraminal protrusion.  There is moderate to severe right foraminal stenosis.  There is mild to moderate left foraminal stenosis.  There is mild central stenosis.  At L2-3 there is moderate degenerative disc disease.  At L1-2 there is severe degenerative disc disease.  X-rays of the pelvis dated 03/24/16 demonstrate mild joint space narrowing bilaterally.  There is mild subchondral sclerosis of the acetabulum bilaterally.  No significant femoral head or acetabular spurs are noted.  Overall mild degenerative changes of the hips.  2.  Hypertension, dyslipidemia, gastroesophageal reflux disease, BPH. 3.  Coronary artery disease, status post cardiac stenting. 4.  Pacemaker.    Plan 1.  Continue Tylenol  500 mg 1-2 tablets 3 times daily as needed. 2.  NSAIDs have been discontinued by cardiology. 3.  Continue home exercises as learned in physical therapy at the [facility] of [place]. 4.  He will continue to follow-up with Dr. Mavis (neurosurgery) as indicated (last seen in 2017). 5.  He received a prescription for an over-the-counter TENS unit. 6.  He we will follow-up with me as needed.  If he has recurrent lower extremity pain he can call and we could provide him with a prednisone  5 mg 6 day taper or we will schedule him for a right L3-4 and left L4-5 transforaminal ESI (after 03/04/2025).    BENJAMIN  CHARLES CHASNIS, DO  This note was generated in part with voice recognition software and I apologize for any typographical errors that were not detected and corrected.  PCP:  Dr. Diedra

## 2025-01-03 ENCOUNTER — Telehealth: Payer: Self-pay

## 2025-01-03 ENCOUNTER — Other Ambulatory Visit: Payer: Self-pay

## 2025-01-03 DIAGNOSIS — R972 Elevated prostate specific antigen [PSA]: Secondary | ICD-10-CM

## 2025-01-03 DIAGNOSIS — N400 Enlarged prostate without lower urinary tract symptoms: Secondary | ICD-10-CM | POA: Insufficient documentation

## 2025-01-03 NOTE — Progress Notes (Signed)
" ° °  01/10/2025 1:45 PM   [name]. 18-Feb-1941 [id number]  Reason for visit: Follow up BPH, elevated PSA   HPI: 84 y.o. male, initial follow up with me today, previously seen by Dr. Penne in Jan 2025  Prior HPI: Hx of BPH  - s/p Urolift x2 (2021) + cystolithalopaxy  - on Avodart  every other day   Hx of elevated PSA   - PSA 4.5 (Jan 2025) - corrected to ~9  - PSA 3.12 (2019) - corrected to ~6  - s/p x3 prior prostate biopsies (2010- 2015) = negative  Hx of gross hematuria  - CTU 04/2020 - bladder stones  - cysto 05/2020 - NED    Physical Exam: BP 104/71   Pulse 76   Wt 180 lb (81.6 kg)   SpO2 95%   BMI 29.95 kg/m    Constitutional:  Alert and oriented, No acute distress.  Laboratory Data:        Component Ref Range & Units (hover) 7 d ago 1 yr ago 2 yr ago 4 yr ago 5 yr ago  Prostate Specific Ag, Serum 3.7 4.5 High  CM 4.3 High  CM 3.7 CM 4.2 High  CM      Pertinent Imaging: N/A    Assessment & Plan:    Elevated prostate specific antigen (PSA) Assessment & Plan:  - 3-4 baseline (corrected to ~6-9) on Avodart   - s/p x3 prior biopsies (2010-2015) - negative  PSA 3.7 (Jan 2026)   Reviewed his clinical history and recent PSA data.  I would advocate for discontinuation of routine PSA screening.  He is at very low risk of harboring clinically significant prostate disease.  Reviewed risk benefits and rationale today.  Ultimately he was agreeable with this plan  - Discontinue future routine screening PSA   Benign prostatic hyperplasia, unspecified whether lower urinary tract symptoms present Assessment & Plan: s/p Urolift x2 (2021) + cystolithalopaxy  - on Avodart  every 3 days  PVR 53cc today IPSS 7/2 today  Overall doing well, stable urinary habits, minimally bothered.  He has been taking a chronically subtherapeutic dose of Avodart  although seems to be working fine.  If his symptom score increases we may consider uptitrating, or adding combo  therapy with alpha-blocker.  - RTC in 1 year with IPSS, PVR  Orders: -     Bladder Scan (Post Void Residual) in office       Ronnie JONELLE Skye, MD  [facility] [address], [address] [place], [place] [address] [phone] "

## 2025-01-03 NOTE — Assessment & Plan Note (Addendum)
" -   3-4 baseline (corrected to ~6-9) on Avodart   - s/p x3 prior biopsies (2010-2015) - negative  PSA 3.7 (Jan 2026)   Reviewed his clinical history and recent PSA data.  I would advocate for discontinuation of routine PSA screening.  He is at very low risk of harboring clinically significant prostate disease.  Reviewed risk benefits and rationale today.  Ultimately he was agreeable with this plan  - Discontinue future routine screening PSA "

## 2025-01-03 NOTE — Progress Notes (Signed)
 EPIC Encounter for ICM Monitoring  Patient Name: [name]. is a 84 y.o. male Date: 01/03/2025 Primary Care Physican: Diedra Lame, MD Primary Cardiologist: Kennyth Electrophysiologist: Kennyth Pore Pacing: 92.3%         01/18/2024 Office Weight: 184 lbs 02/02/2024 Weight: 177 lbs 03/10/2024 Weight: 178-180 lbs 05/19/2024 Weight: 183 lbs 07/05/2027 Weight: 178.8 lbs (lost 2 lbs today after taking Furosemide ) 11/20/2024 Weight: 182 lbs   Since 20-Nov-2024 Time in AT/AF 0.0 hr/day (0.0%)                                                          Spoke with patient and heart failure questions reviewed.  Transmission results reviewed.  Pt asymptomatic for fluid accumulation.  Reports feeling well at this time and voices no complaints.     Since 11/20/2024 ICM Remote Transmission: [employer] thoracic impedance suggesting normal fluid levels with the exception of possible fluid accumulation from 11/29/2024-12/10/2024.    Prescribed:  Furosemide  80 mg Take 1 tablet (s) (80 mg total) by mouth every OTHER day.   Spironolactone  25 mg take 1 tablet by mouth daily   Labs: 07/10/2024 Creatinine 1.0, BUN 27, Potassium 4.8, Sodium 138, GFR 75 01/17/2024 Creatinine 1.0, BUN 23, Potassium 4.7, Sodium 140, GFR 75 A complete set of results can be found in Results Review.   Recommendations:  No changes and encouraged to call if experiencing any fluid symptoms.   Follow-up plan: ICM clinic phone appointment on 02/02/2025.   91 day device clinic remote transmission 02/01/2025.     EP/Cardiology Office Visits:  Recall 11/15/2024 with Dr Kennyth.     Copy of ICM check sent to Dr Kennyth.    Remote monitoring is medically necessary for Heart Failure Management.    Daily Thoracic Impedance ICM trend: 10/02/2024 through 01/01/2025.    12-14 Month Thoracic Impedance ICM trend:     Mitzie GORMAN Garner, RN 01/03/2025 9:15 AM

## 2025-01-03 NOTE — Assessment & Plan Note (Addendum)
 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  He has been taking a chronically subtherapeutic dose of Avodart  although seems to be working fine.  If his symptom score increases we may consider uptitrating, or adding combo therapy with alpha-blocker.  - RTC in 1 year with IPSS, PVR

## 2025-01-03 NOTE — Telephone Encounter (Signed)
 Remote ICM transmission received.  Attempted call to patient regarding ICM remote transmission.

## 2025-01-04 LAB — PSA: Prostate Specific Ag, Serum: 3.7 ng/mL (ref 0.0–4.0)

## 2025-01-09 ENCOUNTER — Ambulatory Visit: Payer: Self-pay | Admitting: Urology

## 2025-01-10 ENCOUNTER — Ambulatory Visit (INDEPENDENT_AMBULATORY_CARE_PROVIDER_SITE_OTHER): Admitting: Urology

## 2025-01-10 VITALS — BP 104/71 | HR 76 | Wt 180.0 lb

## 2025-01-10 DIAGNOSIS — R972 Elevated prostate specific antigen [PSA]: Secondary | ICD-10-CM

## 2025-01-10 DIAGNOSIS — N400 Enlarged prostate without lower urinary tract symptoms: Secondary | ICD-10-CM

## 2025-01-11 ENCOUNTER — Ambulatory Visit: Admitting: Urology

## 2025-01-18 NOTE — Progress Notes (Signed)
 31 day ICM Remote transmission canceled due to [facility] clinic is on hold until further notice.  91 day remote monitoring will continue per protocol.

## 2025-02-02 ENCOUNTER — Ambulatory Visit

## 2026-01-09 ENCOUNTER — Ambulatory Visit: Admitting: Urology
